# Patient Record
Sex: Male | Born: 1970 | Race: Black or African American | Hispanic: No | Marital: Single | Smoking: Never smoker
Health system: Southern US, Community
[De-identification: ages and names within clinical notes are randomized; demographics above are authoritative.]

## PROBLEM LIST (undated history)

## (undated) ENCOUNTER — Ambulatory Visit

## (undated) ENCOUNTER — Ambulatory Visit: Payer: PRIVATE HEALTH INSURANCE

## (undated) ENCOUNTER — Ambulatory Visit: Payer: PRIVATE HEALTH INSURANCE | Attending: Oncology | Primary: Oncology

## (undated) ENCOUNTER — Encounter

## (undated) ENCOUNTER — Telehealth
Attending: Student in an Organized Health Care Education/Training Program | Primary: Student in an Organized Health Care Education/Training Program

## (undated) ENCOUNTER — Ambulatory Visit: Payer: Medicaid (Managed Care)

## (undated) ENCOUNTER — Other Ambulatory Visit

## (undated) ENCOUNTER — Encounter
Attending: Student in an Organized Health Care Education/Training Program | Primary: Student in an Organized Health Care Education/Training Program

## (undated) ENCOUNTER — Telehealth: Attending: Family Medicine | Primary: Family Medicine

## (undated) ENCOUNTER — Telehealth

## (undated) ENCOUNTER — Ambulatory Visit
Payer: Medicaid (Managed Care) | Attending: Student in an Organized Health Care Education/Training Program | Primary: Student in an Organized Health Care Education/Training Program

## (undated) ENCOUNTER — Encounter: Attending: Oncology | Primary: Oncology

## (undated) ENCOUNTER — Ambulatory Visit: Payer: Medicaid (Managed Care) | Attending: Ambulatory Care | Primary: Ambulatory Care

## (undated) ENCOUNTER — Encounter: Attending: Ambulatory Care | Primary: Ambulatory Care

## (undated) ENCOUNTER — Telehealth: Attending: Oncology | Primary: Oncology

## (undated) ENCOUNTER — Ambulatory Visit
Payer: MEDICAID | Attending: Student in an Organized Health Care Education/Training Program | Primary: Student in an Organized Health Care Education/Training Program

## (undated) ENCOUNTER — Encounter: Attending: Family Medicine | Primary: Family Medicine

## (undated) ENCOUNTER — Ambulatory Visit: Payer: PRIVATE HEALTH INSURANCE | Attending: Speech-Language Pathologist | Primary: Speech-Language Pathologist

## (undated) ENCOUNTER — Encounter: Attending: "Endocrinology | Primary: "Endocrinology

## (undated) ENCOUNTER — Other Ambulatory Visit: Attending: Clinical | Primary: Clinical

## (undated) ENCOUNTER — Ambulatory Visit: Attending: Family Medicine | Primary: Family Medicine

## (undated) ENCOUNTER — Ambulatory Visit: Payer: PRIVATE HEALTH INSURANCE | Attending: Ambulatory Care | Primary: Ambulatory Care

## (undated) ENCOUNTER — Encounter: Attending: Internal Medicine | Primary: Internal Medicine

## (undated) ENCOUNTER — Telehealth: Attending: "Endocrinology | Primary: "Endocrinology

## (undated) ENCOUNTER — Encounter: Attending: Pulmonary Disease | Primary: Pulmonary Disease

## (undated) ENCOUNTER — Ambulatory Visit: Attending: Adult Health | Primary: Adult Health

## (undated) ENCOUNTER — Encounter: Attending: Radiation Oncology | Primary: Radiation Oncology

## (undated) ENCOUNTER — Ambulatory Visit
Payer: PRIVATE HEALTH INSURANCE | Attending: Student in an Organized Health Care Education/Training Program | Primary: Student in an Organized Health Care Education/Training Program

## (undated) ENCOUNTER — Encounter: Attending: Nuclear Radiology | Primary: Nuclear Radiology

## (undated) ENCOUNTER — Ambulatory Visit: Payer: PRIVATE HEALTH INSURANCE | Attending: Registered" | Primary: Registered"

## (undated) ENCOUNTER — Encounter: Attending: Dermatology | Primary: Dermatology

## (undated) ENCOUNTER — Ambulatory Visit: Payer: PRIVATE HEALTH INSURANCE | Attending: "Endocrinology | Primary: "Endocrinology

## (undated) ENCOUNTER — Telehealth: Attending: Pulmonary Disease | Primary: Pulmonary Disease

## (undated) ENCOUNTER — Ambulatory Visit: Payer: Medicaid (Managed Care) | Attending: Oncology | Primary: Oncology

## (undated) DIAGNOSIS — I10 Essential (primary) hypertension: Secondary | ICD-10-CM

## (undated) DIAGNOSIS — E119 Type 2 diabetes mellitus without complications: Secondary | ICD-10-CM

---

## 2000-10-17 ENCOUNTER — Encounter: Payer: Self-pay | Admitting: Emergency Medicine

## 2000-10-17 ENCOUNTER — Emergency Department (HOSPITAL_COMMUNITY): Admission: EM | Admit: 2000-10-17 | Discharge: 2000-10-17 | Payer: Self-pay | Admitting: Emergency Medicine

## 2003-10-19 ENCOUNTER — Emergency Department (HOSPITAL_COMMUNITY): Admission: EM | Admit: 2003-10-19 | Discharge: 2003-10-19 | Payer: Self-pay | Admitting: *Deleted

## 2004-01-07 ENCOUNTER — Emergency Department (HOSPITAL_COMMUNITY): Admission: EM | Admit: 2004-01-07 | Discharge: 2004-01-08 | Payer: Self-pay | Admitting: Emergency Medicine

## 2004-02-04 ENCOUNTER — Emergency Department (HOSPITAL_COMMUNITY): Admission: EM | Admit: 2004-02-04 | Discharge: 2004-02-04 | Payer: Self-pay | Admitting: Emergency Medicine

## 2004-02-15 ENCOUNTER — Emergency Department (HOSPITAL_COMMUNITY): Admission: EM | Admit: 2004-02-15 | Discharge: 2004-02-16 | Payer: Self-pay | Admitting: *Deleted

## 2015-10-16 ENCOUNTER — Encounter (HOSPITAL_COMMUNITY): Payer: Self-pay

## 2015-10-16 ENCOUNTER — Emergency Department (HOSPITAL_COMMUNITY)
Admission: EM | Admit: 2015-10-16 | Discharge: 2015-10-16 | Disposition: A | Discharge status: Home / Self Care | Payer: Self-pay | Attending: Emergency Medicine | Admitting: Emergency Medicine

## 2015-10-16 DIAGNOSIS — Y999 Unspecified external cause status: Secondary | ICD-10-CM | POA: Insufficient documentation

## 2015-10-16 DIAGNOSIS — S01511A Laceration without foreign body of lip, initial encounter: Secondary | ICD-10-CM | POA: Insufficient documentation

## 2015-10-16 DIAGNOSIS — Y939 Activity, unspecified: Secondary | ICD-10-CM | POA: Insufficient documentation

## 2015-10-16 DIAGNOSIS — W228XXA Striking against or struck by other objects, initial encounter: Secondary | ICD-10-CM | POA: Insufficient documentation

## 2015-10-16 DIAGNOSIS — Y929 Unspecified place or not applicable: Secondary | ICD-10-CM | POA: Insufficient documentation

## 2015-10-16 MED ORDER — OXYCODONE-ACETAMINOPHEN 5-325 MG PO TABS
1.0000 | ORAL_TABLET | Freq: Four times a day (QID) | ORAL | 0 refills | Status: AC | PRN
Start: 2015-10-16 — End: ?

## 2015-10-16 MED ORDER — CLINDAMYCIN HCL 150 MG PO CAPS: 300.0000 mg | ORAL_CAPSULE | Freq: Three times a day (TID) | ORAL | 0 refills | Status: AC

## 2015-10-16 MED ORDER — LIDOCAINE HCL (PF) 1 % IJ SOLN
5.0000 mL | Freq: Once | INTRAMUSCULAR | Status: AC
  Administered 2015-10-16: 5 mL
  Filled 2015-10-16: qty 5
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
## 2015-10-16 NOTE — Discharge Instructions (Signed)
You must return in 5 days to have your sutures removed.  Return sooner for fever, swelling, or discharge.  Take antibiotics as directed.

## 2016-02-02 DIAGNOSIS — R739 Hyperglycemia, unspecified: Secondary | ICD-10-CM

## 2016-02-17 LAB — GLUCOSE, POCT (MANUAL RESULT ENTRY): POC Glucose: 468 mg/dl — AB (ref 70–99)

## 2016-02-17 NOTE — Congregational Nurse Program (Signed)
Congregational Nurse Program Note  Date of Encounter: 02/02/2016  Past Medical History: No past medical history on file.  Encounter Details:     CNP Questionnaire - 02/02/16 1100      Patient Demographics   Is this a new or existing patient? New   Patient is considered a/an Not Applicable   Race African-American/Black     Patient Assistance   Location of Patient Assistance Not Applicable   Patient's financial/insurance status Low Income;Self-Pay (Uninsured)   Uninsured Patient ([employer], [employer]s) Yes   Interventions Not Applicable   Patient referred to apply for the following financial assistance Not Applicable   Food insecurities addressed Not Applicable   Transportation assistance No   Educational health offerings Other     Encounter Details   Primary purpose of visit Other   Was an Emergency Department visit averted? Not Applicable   Does patient have a medical provider? Yes   Patient referred to Not Applicable   Was a mental health screening completed? (GAINS tool) No   Does patient have dental issues? No   Does patient have vision issues? No   Does your patient have an abnormal blood pressure today? No   Since previous encounter, have you referred patient for abnormal blood pressure that resulted in a new diagnosis or medication change? No   Does your patient have an abnormal blood glucose today? No   Since previous encounter, have you referred patient for abnormal blood glucose that resulted in a new diagnosis or medication change? No   Was there a life-saving intervention made? No     Requested B/P check and blood glucose check.  B/P 160/100   CBG 468.  Assisted with referral to Chales AbrahamsMary Ann Placey to be seen the next day.  [facility]

## 2016-07-10 ENCOUNTER — Emergency Department (HOSPITAL_COMMUNITY)
Admission: EM | Admit: 2016-07-10 | Discharge: 2016-07-11 | Disposition: A | Discharge status: Home / Self Care | Payer: Self-pay | Attending: Emergency Medicine | Admitting: Emergency Medicine

## 2016-07-10 ENCOUNTER — Emergency Department (HOSPITAL_COMMUNITY): Payer: Self-pay

## 2016-07-10 ENCOUNTER — Encounter (HOSPITAL_COMMUNITY): Payer: Self-pay | Admitting: Emergency Medicine

## 2016-07-10 DIAGNOSIS — Y929 Unspecified place or not applicable: Secondary | ICD-10-CM | POA: Insufficient documentation

## 2016-07-10 DIAGNOSIS — S20412A Abrasion of left back wall of thorax, initial encounter: Secondary | ICD-10-CM | POA: Insufficient documentation

## 2016-07-10 DIAGNOSIS — S01511A Laceration without foreign body of lip, initial encounter: Secondary | ICD-10-CM | POA: Insufficient documentation

## 2016-07-10 DIAGNOSIS — E119 Type 2 diabetes mellitus without complications: Secondary | ICD-10-CM | POA: Insufficient documentation

## 2016-07-10 DIAGNOSIS — Y999 Unspecified external cause status: Secondary | ICD-10-CM | POA: Insufficient documentation

## 2016-07-10 DIAGNOSIS — S40212A Abrasion of left shoulder, initial encounter: Secondary | ICD-10-CM | POA: Insufficient documentation

## 2016-07-10 DIAGNOSIS — Y939 Activity, unspecified: Secondary | ICD-10-CM | POA: Insufficient documentation

## 2016-07-10 DIAGNOSIS — S0990XA Unspecified injury of head, initial encounter: Secondary | ICD-10-CM

## 2016-07-10 DIAGNOSIS — Z79899 Other long term (current) drug therapy: Secondary | ICD-10-CM | POA: Insufficient documentation

## 2016-07-10 DIAGNOSIS — S8002XA Contusion of left knee, initial encounter: Secondary | ICD-10-CM | POA: Insufficient documentation

## 2016-07-10 DIAGNOSIS — T07XXXA Unspecified multiple injuries, initial encounter: Secondary | ICD-10-CM

## 2016-07-10 DIAGNOSIS — S060X1A Concussion with loss of consciousness of 30 minutes or less, initial encounter: Secondary | ICD-10-CM | POA: Insufficient documentation

## 2016-07-10 DIAGNOSIS — I10 Essential (primary) hypertension: Secondary | ICD-10-CM | POA: Insufficient documentation

## 2016-07-10 DIAGNOSIS — M25562 Pain in left knee: Secondary | ICD-10-CM | POA: Insufficient documentation

## 2016-07-10 DIAGNOSIS — R4182 Altered mental status, unspecified: Secondary | ICD-10-CM | POA: Insufficient documentation

## 2016-07-10 DIAGNOSIS — R935 Abnormal findings on diagnostic imaging of other abdominal regions, including retroperitoneum: Secondary | ICD-10-CM | POA: Insufficient documentation

## 2016-07-10 DIAGNOSIS — R911 Solitary pulmonary nodule: Secondary | ICD-10-CM | POA: Insufficient documentation

## 2016-07-10 HISTORY — DX: Essential (primary) hypertension: I10

## 2016-07-10 HISTORY — DX: Type 2 diabetes mellitus without complications: E11.9

## 2016-07-10 LAB — URINALYSIS, ROUTINE W REFLEX MICROSCOPIC
Bacteria, UA: NONE SEEN
Bilirubin Urine: NEGATIVE
HGB URINE DIPSTICK: NEGATIVE
Ketones, ur: 5 mg/dL — AB
Leukocytes, UA: NEGATIVE
Nitrite: NEGATIVE
PH: 5 (ref 5.0–8.0)
Protein, ur: NEGATIVE mg/dL
RBC / HPF: NONE SEEN RBC/hpf (ref 0–5)
SPECIFIC GRAVITY, URINE: 1.007 (ref 1.005–1.030)
Squamous Epithelial / LPF: NONE SEEN

## 2016-07-10 LAB — CBC WITH DIFFERENTIAL/PLATELET
BASOS PCT: 0 %
Basophils Absolute: 0 10*3/uL (ref 0.0–0.1)
EOS ABS: 0.1 10*3/uL (ref 0.0–0.7)
EOS PCT: 2 %
HCT: 38.9 % — ABNORMAL LOW (ref 39.0–52.0)
HEMOGLOBIN: 12.9 g/dL — AB (ref 13.0–17.0)
Lymphocytes Relative: 26 %
Lymphs Abs: 1.5 10*3/uL (ref 0.7–4.0)
MCH: 24.9 pg — ABNORMAL LOW (ref 26.0–34.0)
MCHC: 33.2 g/dL (ref 30.0–36.0)
MCV: 75.1 fL — ABNORMAL LOW (ref 78.0–100.0)
MONO ABS: 0.3 10*3/uL (ref 0.1–1.0)
MONOS PCT: 6 %
NEUTROS PCT: 66 %
Neutro Abs: 3.9 10*3/uL (ref 1.7–7.7)
PLATELETS: 174 10*3/uL (ref 150–400)
RBC: 5.18 MIL/uL (ref 4.22–5.81)
RDW: 14.3 % (ref 11.5–15.5)
WBC: 5.9 10*3/uL (ref 4.0–10.5)

## 2016-07-10 LAB — COMPREHENSIVE METABOLIC PANEL
ALK PHOS: 80 U/L (ref 38–126)
ALT: 13 U/L — AB (ref 17–63)
AST: 25 U/L (ref 15–41)
Albumin: 3.7 g/dL (ref 3.5–5.0)
Anion gap: 19 — ABNORMAL HIGH (ref 5–15)
BUN: 10 mg/dL (ref 6–20)
CALCIUM: 8.7 mg/dL — AB (ref 8.9–10.3)
CO2: 18 mmol/L — AB (ref 22–32)
CREATININE: 0.99 mg/dL (ref 0.61–1.24)
Chloride: 97 mmol/L — ABNORMAL LOW (ref 101–111)
GFR calc non Af Amer: >60 mL/min (ref >60)
Glucose, Bld: 401 mg/dL — ABNORMAL HIGH (ref 65–99)
Potassium: 3.5 mmol/L (ref 3.5–5.1)
SODIUM: 134 mmol/L — AB (ref 135–145)
Total Bilirubin: 0.6 mg/dL (ref 0.3–1.2)
Total Protein: 6.5 g/dL (ref 6.5–8.1)

## 2016-07-10 LAB — I-STAT CHEM 8, ED
BUN: 10 mg/dL (ref 6–20)
CALCIUM ION: 1.13 mmol/L — AB (ref 1.15–1.40)
CHLORIDE: 97 mmol/L — AB (ref 101–111)
Creatinine, Ser: 1.1 mg/dL (ref 0.61–1.24)
Glucose, Bld: 406 mg/dL — ABNORMAL HIGH (ref 65–99)
HCT: 41 % (ref 39.0–52.0)
Hemoglobin: 13.9 g/dL (ref 13.0–17.0)
Potassium: 3.3 mmol/L — ABNORMAL LOW (ref 3.5–5.1)
SODIUM: 135 mmol/L (ref 135–145)
TCO2: 18 mmol/L (ref 0–100)

## 2016-07-10 LAB — RAPID URINE DRUG SCREEN, HOSP PERFORMED
AMPHETAMINES: NOT DETECTED
Barbiturates: NOT DETECTED
Benzodiazepines: NOT DETECTED
Cocaine: NOT DETECTED
Opiates: NOT DETECTED
TETRAHYDROCANNABINOL: NOT DETECTED

## 2016-07-10 LAB — CBG MONITORING, ED: Glucose-Capillary: 430 mg/dL — ABNORMAL HIGH (ref 65–99)

## 2016-07-10 LAB — ETHANOL: Alcohol, Ethyl (B): 203 mg/dL — ABNORMAL HIGH (ref <5)

## 2016-07-10 MED ORDER — SODIUM CHLORIDE 0.9 % IV BOLUS (SEPSIS)
500.0000 mL | Freq: Once | INTRAVENOUS | Status: AC
  Administered 2016-07-10: 500 mL via INTRAVENOUS
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
## 2016-07-10 NOTE — ED Notes (Signed)
ED Provider at bedside. 

## 2016-07-11 ENCOUNTER — Emergency Department (HOSPITAL_COMMUNITY): Payer: Self-pay

## 2016-07-11 ENCOUNTER — Encounter (HOSPITAL_COMMUNITY): Payer: Self-pay

## 2016-07-11 LAB — BASIC METABOLIC PANEL
Anion gap: 13 (ref 5–15)
BUN: 5 mg/dL — AB (ref 6–20)
CHLORIDE: 101 mmol/L (ref 101–111)
CO2: 23 mmol/L (ref 22–32)
CREATININE: 0.92 mg/dL (ref 0.61–1.24)
Calcium: 8.9 mg/dL (ref 8.9–10.3)
GFR calc Af Amer: >60 mL/min (ref >60)
GFR calc non Af Amer: >60 mL/min (ref >60)
Glucose, Bld: 348 mg/dL — ABNORMAL HIGH (ref 65–99)
Potassium: 3.9 mmol/L (ref 3.5–5.1)
Sodium: 137 mmol/L (ref 135–145)

## 2016-07-11 LAB — I-STAT VENOUS BLOOD GAS, ED
Bicarbonate: 24.7 mmol/L (ref 20.0–28.0)
O2 SAT: 83 %
PCO2 VEN: 39.9 mmHg — AB (ref 44.0–60.0)
PO2 VEN: 47 mmHg — AB (ref 32.0–45.0)
TCO2: 26 mmol/L (ref 0–100)
pH, Ven: 7.4 (ref 7.250–7.430)

## 2016-07-11 MED ORDER — IOPAMIDOL (ISOVUE-300) INJECTION 61%
INTRAVENOUS | Status: AC
  Administered 2016-07-11: 100 mL via INTRAVENOUS
  Filled 2016-07-11: qty 100

## 2016-07-11 MED ORDER — KETOROLAC TROMETHAMINE 30 MG/ML IJ SOLN
15.0000 mg | Freq: Once | INTRAMUSCULAR | Status: AC
  Administered 2016-07-11: 15 mg via INTRAVENOUS
  Filled 2016-07-11: qty 1

## 2016-07-11 NOTE — ED Notes (Signed)
ED Provider at bedside. 

## 2016-07-11 NOTE — ED Notes (Signed)
Pt departed in NAD. Unable to obtain pt signature for d/c because there were no electronic signature pads available.
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
## 2016-07-11 NOTE — ED Provider Notes (Signed)
Anion gap more likely from ETOH rather than DKA. Repeat BMP benign.  He continues to have left knee pain. X-rays unremarkable. He is able to bear weight but is limping. He states this is an acute on chronic issue. He was given crutches and will follow-up with PCP.   Pricilla LovelessGoldston, Gillian Meeuwsen, MD 07/11/16 [phone]

## 2016-07-11 NOTE — ED Notes (Signed)
Patient transported to X-ray 

## 2016-07-11 NOTE — ED Notes (Signed)
Attempted to ambulate pt, but pt only able to take a few steps while standing, seeming to be unable to bear much weight on L leg.

## 2016-07-28 ENCOUNTER — Emergency Department (HOSPITAL_COMMUNITY)
Admission: EM | Admit: 2016-07-28 | Discharge: 2016-07-29 | Disposition: A | Discharge status: Home / Self Care | Payer: Self-pay | Attending: Emergency Medicine | Admitting: Emergency Medicine

## 2016-07-28 ENCOUNTER — Encounter (HOSPITAL_COMMUNITY): Payer: Self-pay | Admitting: Emergency Medicine

## 2016-07-28 ENCOUNTER — Emergency Department (HOSPITAL_COMMUNITY): Payer: Self-pay

## 2016-07-28 DIAGNOSIS — F1010 Alcohol abuse, uncomplicated: Secondary | ICD-10-CM | POA: Insufficient documentation

## 2016-07-28 DIAGNOSIS — I10 Essential (primary) hypertension: Secondary | ICD-10-CM | POA: Insufficient documentation

## 2016-07-28 DIAGNOSIS — E1165 Type 2 diabetes mellitus with hyperglycemia: Secondary | ICD-10-CM | POA: Insufficient documentation

## 2016-07-28 DIAGNOSIS — R739 Hyperglycemia, unspecified: Secondary | ICD-10-CM

## 2016-07-28 LAB — CBG MONITORING, ED: Glucose-Capillary: 393 mg/dL — ABNORMAL HIGH (ref 65–99)

## 2016-07-28 LAB — I-STAT CHEM 8, ED
BUN: 7 mg/dL (ref 6–20)
CALCIUM ION: 1.21 mmol/L (ref 1.15–1.40)
CHLORIDE: 98 mmol/L — AB (ref 101–111)
CREATININE: 1.3 mg/dL — AB (ref 0.61–1.24)
Glucose, Bld: 449 mg/dL — ABNORMAL HIGH (ref 65–99)
HCT: 39 % (ref 39.0–52.0)
Hemoglobin: 13.3 g/dL (ref 13.0–17.0)
Potassium: 3.5 mmol/L (ref 3.5–5.1)
SODIUM: 138 mmol/L (ref 135–145)
TCO2: 28 mmol/L (ref 0–100)

## 2016-07-28 LAB — ETHANOL: Alcohol, Ethyl (B): 214 mg/dL — ABNORMAL HIGH (ref <5)

## 2016-07-28 LAB — I-STAT TROPONIN, ED: TROPONIN I, POC: 0.02 ng/mL (ref 0.00–0.08)
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
## 2016-07-28 NOTE — ED Notes (Signed)
Pt would not stop eating his [employer] to answer questions.

## 2016-07-28 NOTE — ED Notes (Signed)
Patient transported to CT 
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
## 2016-07-28 NOTE — ED Triage Notes (Signed)
Pt was at [employer] eating when he had a syncopal episode. Pt has ETOH on board. Pt was agitated at first but clamed down since. Recently incarcerated and has not been on any of his home medication for approx a month. EMS report glucose of 489 and had 500 cc NS infusing. Pt is asleep during triage

## 2016-07-29 LAB — CBG MONITORING, ED: Glucose-Capillary: 302 mg/dL — ABNORMAL HIGH (ref 65–99)

## 2016-07-29 NOTE — ED Notes (Signed)
Pt more alert at this time. Pt asking for water is able to get up out of bed.

## 2016-07-29 NOTE — ED Provider Notes (Signed)
Plan to monitor patient until sober then reassess He is currently resting comfortably, vitals appropriate BP (!) 134/93   Pulse 94   Temp 98.4 F (36.9 C) (Oral)   Resp (!) 26   SpO2 96%     Zadie RhineWickline, Jaleena Viviani, MD 07/29/16 0031

## 2016-07-29 NOTE — ED Notes (Signed)
EDP at bedside  
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
## 2016-07-29 NOTE — ED Notes (Addendum)
PT states understanding of care given, follow up care. PT ambulated from ED to car with a steady gait.  

## 2016-08-08 ENCOUNTER — Emergency Department (HOSPITAL_COMMUNITY)
Admission: EM | Admit: 2016-08-08 | Discharge: 2016-08-08 | Disposition: A | Discharge status: Home / Self Care | Payer: Self-pay | Attending: Emergency Medicine | Admitting: Emergency Medicine

## 2016-08-08 ENCOUNTER — Encounter (HOSPITAL_COMMUNITY): Payer: Self-pay | Admitting: Emergency Medicine

## 2016-08-08 DIAGNOSIS — Z0131 Encounter for examination of blood pressure with abnormal findings: Secondary | ICD-10-CM | POA: Insufficient documentation

## 2016-08-08 DIAGNOSIS — I10 Essential (primary) hypertension: Secondary | ICD-10-CM

## 2016-08-08 LAB — CBC
HEMATOCRIT: 38.2 % — AB (ref 39.0–52.0)
HEMOGLOBIN: 12 g/dL — AB (ref 13.0–17.0)
MCH: 24 pg — AB (ref 26.0–34.0)
MCHC: 31.4 g/dL (ref 30.0–36.0)
MCV: 76.2 fL — AB (ref 78.0–100.0)
Platelets: 236 10*3/uL (ref 150–400)
RBC: 5.01 MIL/uL (ref 4.22–5.81)
RDW: 15 % (ref 11.5–15.5)
WBC: 7.7 10*3/uL (ref 4.0–10.5)

## 2016-08-08 LAB — BASIC METABOLIC PANEL
Anion gap: 8 (ref 5–15)
BUN: 7 mg/dL (ref 6–20)
CALCIUM: 9.1 mg/dL (ref 8.9–10.3)
CHLORIDE: 100 mmol/L — AB (ref 101–111)
CO2: 26 mmol/L (ref 22–32)
CREATININE: 0.82 mg/dL (ref 0.61–1.24)
GFR calc non Af Amer: >60 mL/min (ref >60)
GLUCOSE: 209 mg/dL — AB (ref 65–99)
Potassium: 3.6 mmol/L (ref 3.5–5.1)
Sodium: 134 mmol/L — ABNORMAL LOW (ref 135–145)

## 2016-08-08 MED ORDER — CLONIDINE HCL 0.1 MG PO TABS
0.1000 mg | ORAL_TABLET | Freq: Once | ORAL | Status: AC
  Administered 2016-08-08: 0.1 mg via ORAL
  Filled 2016-08-08: qty 1

## 2016-08-08 MED ORDER — AMLODIPINE BESYLATE 5 MG PO TABS: 5.0000 mg | ORAL_TABLET | Freq: Every day | ORAL | 1 refills | Status: AC

## 2016-08-08 MED ORDER — AMLODIPINE BESYLATE 5 MG PO TABS
5.0000 mg | ORAL_TABLET | Freq: Once | ORAL | Status: AC
  Administered 2016-08-08: 5 mg via ORAL
  Filled 2016-08-08: qty 1

## 2016-08-08 MED ORDER — ACETAMINOPHEN 500 MG PO TABS
1000.0000 mg | ORAL_TABLET | Freq: Once | ORAL | Status: AC
  Administered 2016-08-08: 1000 mg via ORAL
  Filled 2016-08-08: qty 2

## 2016-08-08 NOTE — Discharge Instructions (Signed)
You have been prescribed Norvasc for your blood pressure. This medication is $4 at the [facility]. Follow up with your primary care doctor for a recheck of your blood pressure within 1 week.

## 2016-08-08 NOTE — ED Triage Notes (Signed)
Pt. Stated, Vincent Atlasve been in jail for the last few days and my BP is too high per the nurse at jail.
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
## 2016-10-27 ENCOUNTER — Encounter (HOSPITAL_COMMUNITY): Payer: Self-pay | Admitting: Emergency Medicine

## 2016-10-27 ENCOUNTER — Emergency Department (HOSPITAL_COMMUNITY)
Admission: EM | Admit: 2016-10-27 | Discharge: 2016-10-28 | Disposition: A | Discharge status: Home / Self Care | Payer: Self-pay | Attending: Emergency Medicine | Admitting: Emergency Medicine

## 2016-10-27 DIAGNOSIS — K4090 Unilateral inguinal hernia, without obstruction or gangrene, not specified as recurrent: Secondary | ICD-10-CM | POA: Insufficient documentation

## 2016-10-27 DIAGNOSIS — E119 Type 2 diabetes mellitus without complications: Secondary | ICD-10-CM | POA: Insufficient documentation

## 2016-10-27 DIAGNOSIS — Z79899 Other long term (current) drug therapy: Secondary | ICD-10-CM | POA: Insufficient documentation

## 2016-10-27 DIAGNOSIS — K625 Hemorrhage of anus and rectum: Secondary | ICD-10-CM | POA: Insufficient documentation

## 2016-10-27 DIAGNOSIS — I10 Essential (primary) hypertension: Secondary | ICD-10-CM | POA: Insufficient documentation

## 2016-10-27 LAB — CBC WITH DIFFERENTIAL/PLATELET
Basophils Absolute: 0 10*3/uL (ref 0.0–0.1)
Basophils Relative: 0 %
EOS ABS: 0.1 10*3/uL (ref 0.0–0.7)
EOS PCT: 3 %
HCT: 37.2 % — ABNORMAL LOW (ref 39.0–52.0)
Hemoglobin: 12.1 g/dL — ABNORMAL LOW (ref 13.0–17.0)
LYMPHS ABS: 1.6 10*3/uL (ref 0.7–4.0)
LYMPHS PCT: 41 %
MCH: 24.2 pg — AB (ref 26.0–34.0)
MCHC: 32.5 g/dL (ref 30.0–36.0)
MCV: 74.5 fL — AB (ref 78.0–100.0)
MONO ABS: 0.3 10*3/uL (ref 0.1–1.0)
MONOS PCT: 7 %
Neutro Abs: 1.9 10*3/uL (ref 1.7–7.7)
Neutrophils Relative %: 49 %
PLATELETS: 235 10*3/uL (ref 150–400)
RBC: 4.99 MIL/uL (ref 4.22–5.81)
RDW: 14.3 % (ref 11.5–15.5)
WBC: 3.9 10*3/uL — ABNORMAL LOW (ref 4.0–10.5)

## 2016-10-27 LAB — COMPREHENSIVE METABOLIC PANEL
ALK PHOS: 83 U/L (ref 38–126)
ALT: 19 U/L (ref 17–63)
ANION GAP: 13 (ref 5–15)
AST: 34 U/L (ref 15–41)
Albumin: 3.4 g/dL — ABNORMAL LOW (ref 3.5–5.0)
BUN: 9 mg/dL (ref 6–20)
CO2: 24 mmol/L (ref 22–32)
Calcium: 9 mg/dL (ref 8.9–10.3)
Chloride: 104 mmol/L (ref 101–111)
Creatinine, Ser: 1.24 mg/dL (ref 0.61–1.24)
GFR calc Af Amer: >60 mL/min (ref >60)
GFR calc non Af Amer: >60 mL/min (ref >60)
GLUCOSE: 106 mg/dL — AB (ref 65–99)
POTASSIUM: 3.6 mmol/L (ref 3.5–5.1)
SODIUM: 141 mmol/L (ref 135–145)
Total Bilirubin: 0.4 mg/dL (ref 0.3–1.2)
Total Protein: 6.5 g/dL (ref 6.5–8.1)

## 2016-10-27 NOTE — ED Notes (Signed)
Unable to locate x 1  

## 2016-10-27 NOTE — ED Triage Notes (Signed)
Patient reports rectal bleeding and blood in stool onset today , denies emesis or fever , no diarrhea or abdominal pain .

## 2016-10-28 LAB — POC OCCULT BLOOD, ED: Fecal Occult Bld: NEGATIVE

## 2016-10-28 MED ORDER — HYDROCORTISONE ACETATE 25 MG RE SUPP: 25.0000 mg | Freq: Two times a day (BID) | RECTAL | 0 refills | Status: AC

## 2016-10-28 NOTE — ED Provider Notes (Signed)
[facility] Provider Note   CSN: [id number] Arrival date & time: 10/27/16  1948     History   Chief Complaint Chief Complaint  Patient presents with  . Rectal Bleeding    HPI Vincent Stanton is a 46 y.o. male.  Patient presents with complaints of rectal bleeding. Patient reports that he has had 2 episodes tonight, has never had similar bleeding in the past. He had an episode of bright red blood passage without stool earlier today. He then had a second bowel movement that was blood mixed with stool. He has not had any rectal pain or abdominal pain. He has no fever. There is no chest pain, shortness of breath, palpitations, weakness or dizziness.  Patient also complains of a nonpainful lump in his right groin. This has been there for some time. The lump "comes and goes".      Past Medical History:  Diagnosis Date  . Diabetes mellitus without complication (HCC)   . Hypertension     There are no active problems to display for this patient.   History reviewed. No pertinent surgical history.     Home Medications    Prior to Admission medications   Medication Sig Start Date End Date Taking? Authorizing Provider  amLODipine (NORVASC) 5 MG tablet Take 1 tablet (5 mg total) by mouth daily. 08/08/16   Antony Madura, PA-C  clindamycin (CLEOCIN) 150 MG capsule Take 2 capsules (300 mg total) by mouth 3 (three) times daily. May dispense as  capsules 10/16/15   Roxy Horseman, PA-C  oxyCODONE-acetaminophen (PERCOCET/ROXICET) 5-325 MG tablet Take 1 tablet by mouth every 6 (six) hours as needed for severe pain. 10/16/15   Roxy Horseman, PA-C    Family History No family history on file.  Social History Social History  Substance Use Topics  . Smoking status: Never Smoker  . Smokeless tobacco: Never Used  . Alcohol use Yes     Allergies   Penicillins   Review of Systems Review of Systems  Gastrointestinal: Positive for anal bleeding.  All other systems  reviewed and are negative.    Physical Exam Updated Vital Signs BP (!) 180/103 (BP Location: Right Arm)   Pulse 89   Temp 99 F (37.2 C) (Oral)   Resp 18   SpO2 97%   Physical Exam  Constitutional: He is oriented to person, place, and time. He appears well-developed and well-nourished. No distress.  HENT:  Head: Normocephalic and atraumatic.  Right Ear: Hearing normal.  Left Ear: Hearing normal.  Nose: Nose normal.  Mouth/Throat: Oropharynx is clear and moist and mucous membranes are normal.  Eyes: Pupils are equal, round, and reactive to light. Conjunctivae and EOM are normal.  Neck: Normal range of motion. Neck supple.  Cardiovascular: Regular rhythm, S1 normal and S2 normal.  Exam reveals no gallop and no friction rub.   No murmur heard. Pulmonary/Chest: Effort normal and breath sounds normal. No respiratory distress. He exhibits no tenderness.  Abdominal: Soft. Normal appearance and bowel sounds are normal. There is no hepatosplenomegaly. There is no tenderness. There is no rebound, no guarding, no tenderness at McBurney's point and negative Murphy's sign. A hernia is present. Hernia confirmed positive in the right inguinal area (Easily reducible, nontender).  Genitourinary: Rectal exam shows external hemorrhoid. Rectal exam shows no mass and no tenderness.  Genitourinary Comments: Patient did not tolerate rectal exam, I could not reach his prostate or obtain an adequate sample  Musculoskeletal: Normal range of motion.  Neurological: He is  alert and oriented to person, place, and time. He has normal strength. No cranial nerve deficit or sensory deficit. Coordination normal. GCS eye subscore is 4. GCS verbal subscore is 5. GCS motor subscore is 6.  Skin: Skin is warm, dry and intact. No rash noted. No cyanosis.  Psychiatric: He has a normal mood and affect. His speech is normal and behavior is normal. Thought content normal.  Nursing note and vitals reviewed.    ED Treatments  / Results  Labs (all labs ordered are listed, but only abnormal results are displayed) Labs Reviewed  CBC WITH DIFFERENTIAL/PLATELET - Abnormal; Notable for the following:       Result Value   WBC 3.9 (*)    Hemoglobin 12.1 (*)    HCT 37.2 (*)    MCV 74.5 (*)    MCH 24.2 (*)    All other components within normal limits  COMPREHENSIVE METABOLIC PANEL - Abnormal; Notable for the following:    Glucose, Bld 106 (*)    Albumin 3.4 (*)    All other components within normal limits  POC OCCULT BLOOD, ED    EKG  EKG Interpretation None       Radiology No results found.  Procedures Procedures (including critical care time)  Medications Ordered in ED Medications - No data to display   Initial Impression / Assessment and Plan / ED Course  I have reviewed the triage vital signs and the nursing notes.  Pertinent labs & imaging results that were available during my care of the patient were reviewed by me and considered in my medical decision making (see chart for details).     Patient presents with 2 episodes of rectal bleeding today. He has never had similar symptoms. Vital signs are stable, other than hypertension which is chronic for him. No tachycardia, hypotension, dizziness or symptoms of anemia. Hemoglobin is actually normal. No further bleeding here in the ER. Patient will require follow-up with gastroenterology. I did discuss with him the need for colonoscopy to rule out colon cancer and other etiology as a cause of his bleeding. He is to return for worsening bleeding.  Reducible right inguinal hernia. Will refer to general surgery.  Final Clinical Impressions(s) / ED Diagnoses   Final diagnoses:  Rectal bleeding  Non-recurrent unilateral inguinal hernia without obstruction or gangrene    New Prescriptions New Prescriptions   No medications on file     Gilda Crease, MD 10/28/16 [phone]

## 2016-11-16 ENCOUNTER — Ambulatory Visit (HOSPITAL_COMMUNITY): Admission: EM | Admit: 2016-11-16 | Discharge: 2016-11-16 | Discharge status: Against Medical Advice | Payer: Self-pay

## 2016-11-21 ENCOUNTER — Encounter (HOSPITAL_COMMUNITY): Payer: Self-pay | Admitting: Emergency Medicine

## 2016-11-21 ENCOUNTER — Emergency Department (HOSPITAL_COMMUNITY)
Admission: EM | Admit: 2016-11-21 | Discharge: 2016-11-22 | Disposition: A | Discharge status: Home / Self Care | Payer: Self-pay | Attending: Emergency Medicine | Admitting: Emergency Medicine

## 2016-11-21 DIAGNOSIS — E119 Type 2 diabetes mellitus without complications: Secondary | ICD-10-CM | POA: Insufficient documentation

## 2016-11-21 DIAGNOSIS — Y929 Unspecified place or not applicable: Secondary | ICD-10-CM | POA: Insufficient documentation

## 2016-11-21 DIAGNOSIS — Z79899 Other long term (current) drug therapy: Secondary | ICD-10-CM | POA: Insufficient documentation

## 2016-11-21 DIAGNOSIS — S0181XA Laceration without foreign body of other part of head, initial encounter: Secondary | ICD-10-CM | POA: Insufficient documentation

## 2016-11-21 DIAGNOSIS — X58XXXA Exposure to other specified factors, initial encounter: Secondary | ICD-10-CM | POA: Insufficient documentation

## 2016-11-21 DIAGNOSIS — Z23 Encounter for immunization: Secondary | ICD-10-CM | POA: Insufficient documentation

## 2016-11-21 DIAGNOSIS — Y999 Unspecified external cause status: Secondary | ICD-10-CM | POA: Insufficient documentation

## 2016-11-21 DIAGNOSIS — Y939 Activity, unspecified: Secondary | ICD-10-CM | POA: Insufficient documentation

## 2016-11-21 DIAGNOSIS — I10 Essential (primary) hypertension: Secondary | ICD-10-CM | POA: Insufficient documentation
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
## 2016-11-22 ENCOUNTER — Emergency Department (HOSPITAL_COMMUNITY): Payer: Self-pay

## 2016-11-22 MED ORDER — TETANUS-DIPHTH-ACELL PERTUSSIS 5-2.5-18.5 LF-MCG/0.5 IM SUSP
0.5000 mL | Freq: Once | INTRAMUSCULAR | Status: AC
  Administered 2016-11-22: 0.5 mL via INTRAMUSCULAR
  Filled 2016-11-22: qty 0.5

## 2016-11-22 MED ORDER — LIDOCAINE HCL 2 % IJ SOLN
20.0000 mL | Freq: Once | INTRAMUSCULAR | Status: AC
  Administered 2016-11-22: 400 mg
  Filled 2016-11-22: qty 20

## 2016-11-22 NOTE — Discharge Instructions (Signed)
You will need your outside stitches removed in approximately one week. Soft diet and ice as needed to injury.

## 2016-11-22 NOTE — ED Provider Notes (Signed)
LACERATION REPAIR Performed by: Garlon HatchetSANDERS, Ivori Storr M Authorized by: Garlon HatchetSANDERS, Lilliane Sposito M Consent: Verbal consent obtained. Risks and benefits: risks, benefits and alternatives were discussed Consent given by: patient Patient identity confirmed: provided demographic data Prepped and Draped in normal sterile fashion Wound explored  Laceration Location: right cheek  Laceration Length: 2 cm  No Foreign Bodies seen or palpated  Anesthesia: local infiltration  Local anesthetic: lidocaine 2% without epinephrine  Anesthetic total: 3 ml  Irrigation method: syringe Amount of cleaning: standard  Skin closure: 4-0 vicryl (internal mucousa), and 4-0 prolene (skin)  Number of sutures: 4 total (2 internal, 2 external)  Technique: simple interrupted  Patient tolerance: Patient tolerated the procedure well with no immediate complications.    Garlon HatchetSanders, Dellamae Rosamilia M, PA-C 11/22/16 [phone]    Shon BatonHorton, Courtney F, MD 11/22/16 [phone]

## 2016-11-22 NOTE — ED Notes (Signed)
Patient transported to CT scan . 

## 2016-11-22 NOTE — ED Notes (Signed)
Patient given H2O and [place] sandwich/apple sauce.

## 2016-11-22 NOTE — ED Notes (Signed)
PA at bedside suturing pts laceration
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
## 2019-05-30 DIAGNOSIS — I422 Other hypertrophic cardiomyopathy: Principal | ICD-10-CM

## 2019-06-09 DIAGNOSIS — G6289 Other specified polyneuropathies: Principal | ICD-10-CM

## 2019-06-10 ENCOUNTER — Ambulatory Visit: Admit: 2019-06-10 | Discharge: 2019-06-11 | Payer: PRIVATE HEALTH INSURANCE

## 2019-07-08 ENCOUNTER — Ambulatory Visit: Admit: 2019-07-08 | Discharge: 2019-07-09 | Payer: PRIVATE HEALTH INSURANCE

## 2019-07-11 DIAGNOSIS — R079 Chest pain, unspecified: Principal | ICD-10-CM

## 2019-07-15 ENCOUNTER — Ambulatory Visit: Admit: 2019-07-15 | Discharge: 2019-07-16 | Payer: PRIVATE HEALTH INSURANCE

## 2019-08-11 DIAGNOSIS — R9389 Abnormal findings on diagnostic imaging of other specified body structures: Principal | ICD-10-CM

## 2019-08-26 ENCOUNTER — Ambulatory Visit: Admit: 2019-08-26 | Discharge: 2019-08-26 | Payer: PRIVATE HEALTH INSURANCE

## 2019-11-07 DIAGNOSIS — R590 Localized enlarged lymph nodes: Principal | ICD-10-CM

## 2019-11-20 ENCOUNTER — Ambulatory Visit: Admit: 2019-11-20 | Discharge: 2019-11-21 | Payer: PRIVATE HEALTH INSURANCE

## 2019-12-09 ENCOUNTER — Ambulatory Visit
Admit: 2019-12-09 | Discharge: 2019-12-10 | Payer: PRIVATE HEALTH INSURANCE | Attending: Pulmonary Disease | Primary: Pulmonary Disease

## 2019-12-09 DIAGNOSIS — Z87891 Personal history of nicotine dependence: Principal | ICD-10-CM

## 2019-12-09 DIAGNOSIS — R918 Other nonspecific abnormal finding of lung field: Principal | ICD-10-CM

## 2019-12-12 DIAGNOSIS — R911 Solitary pulmonary nodule: Principal | ICD-10-CM

## 2019-12-23 ENCOUNTER — Ambulatory Visit: Admit: 2019-12-23 | Discharge: 2019-12-24 | Payer: PRIVATE HEALTH INSURANCE

## 2019-12-23 DIAGNOSIS — R911 Solitary pulmonary nodule: Principal | ICD-10-CM

## 2019-12-23 DIAGNOSIS — Z7984 Long term (current) use of oral hypoglycemic drugs: Principal | ICD-10-CM

## 2019-12-23 DIAGNOSIS — Z7982 Long term (current) use of aspirin: Principal | ICD-10-CM

## 2019-12-23 DIAGNOSIS — E119 Type 2 diabetes mellitus without complications: Principal | ICD-10-CM

## 2019-12-23 DIAGNOSIS — R918 Other nonspecific abnormal finding of lung field: Principal | ICD-10-CM

## 2019-12-23 DIAGNOSIS — Z794 Long term (current) use of insulin: Principal | ICD-10-CM

## 2019-12-23 DIAGNOSIS — E785 Hyperlipidemia, unspecified: Principal | ICD-10-CM

## 2019-12-23 DIAGNOSIS — Z79899 Other long term (current) drug therapy: Principal | ICD-10-CM

## 2019-12-29 ENCOUNTER — Ambulatory Visit: Admit: 2019-12-29 | Discharge: 2019-12-30 | Payer: PRIVATE HEALTH INSURANCE

## 2020-01-08 DIAGNOSIS — Z79899 Other long term (current) drug therapy: Principal | ICD-10-CM

## 2020-01-08 DIAGNOSIS — E119 Type 2 diabetes mellitus without complications: Principal | ICD-10-CM

## 2020-01-08 DIAGNOSIS — Z7982 Long term (current) use of aspirin: Principal | ICD-10-CM

## 2020-01-08 DIAGNOSIS — Z7984 Long term (current) use of oral hypoglycemic drugs: Principal | ICD-10-CM

## 2020-01-08 DIAGNOSIS — E785 Hyperlipidemia, unspecified: Principal | ICD-10-CM

## 2020-01-08 DIAGNOSIS — Z794 Long term (current) use of insulin: Principal | ICD-10-CM

## 2020-01-08 DIAGNOSIS — R918 Other nonspecific abnormal finding of lung field: Principal | ICD-10-CM

## 2020-01-08 DIAGNOSIS — R911 Solitary pulmonary nodule: Principal | ICD-10-CM

## 2020-01-09 ENCOUNTER — Encounter: Admit: 2020-01-09 | Discharge: 2020-01-10 | Payer: PRIVATE HEALTH INSURANCE

## 2020-01-09 ENCOUNTER — Ambulatory Visit: Admit: 2020-01-09 | Discharge: 2020-01-10 | Payer: PRIVATE HEALTH INSURANCE

## 2020-01-09 DIAGNOSIS — Z79899 Other long term (current) drug therapy: Principal | ICD-10-CM

## 2020-01-09 DIAGNOSIS — Z794 Long term (current) use of insulin: Principal | ICD-10-CM

## 2020-01-09 DIAGNOSIS — Z7984 Long term (current) use of oral hypoglycemic drugs: Principal | ICD-10-CM

## 2020-01-09 DIAGNOSIS — E785 Hyperlipidemia, unspecified: Principal | ICD-10-CM

## 2020-01-09 DIAGNOSIS — R918 Other nonspecific abnormal finding of lung field: Principal | ICD-10-CM

## 2020-01-09 DIAGNOSIS — Z7982 Long term (current) use of aspirin: Principal | ICD-10-CM

## 2020-01-09 DIAGNOSIS — E119 Type 2 diabetes mellitus without complications: Principal | ICD-10-CM

## 2020-01-09 DIAGNOSIS — R911 Solitary pulmonary nodule: Principal | ICD-10-CM

## 2020-01-16 DIAGNOSIS — C73 Malignant neoplasm of thyroid gland: Principal | ICD-10-CM

## 2020-02-17 ENCOUNTER — Ambulatory Visit: Admit: 2020-02-17 | Discharge: 2020-02-18 | Payer: PRIVATE HEALTH INSURANCE

## 2020-02-17 DIAGNOSIS — C73 Malignant neoplasm of thyroid gland: Principal | ICD-10-CM

## 2020-02-19 DIAGNOSIS — C73 Malignant neoplasm of thyroid gland: Principal | ICD-10-CM

## 2020-02-24 ENCOUNTER — Ambulatory Visit
Admit: 2020-02-24 | Discharge: 2020-02-25 | Payer: PRIVATE HEALTH INSURANCE | Attending: Hematology & Oncology | Primary: Hematology & Oncology

## 2020-02-24 ENCOUNTER — Ambulatory Visit: Admit: 2020-02-24 | Discharge: 2020-02-25 | Payer: PRIVATE HEALTH INSURANCE

## 2020-02-24 DIAGNOSIS — C73 Malignant neoplasm of thyroid gland: Principal | ICD-10-CM

## 2020-03-09 ENCOUNTER — Ambulatory Visit: Admit: 2020-03-09 | Discharge: 2020-03-10 | Payer: PRIVATE HEALTH INSURANCE

## 2020-03-09 DIAGNOSIS — C73 Malignant neoplasm of thyroid gland: Principal | ICD-10-CM

## 2020-03-11 DIAGNOSIS — C73 Malignant neoplasm of thyroid gland: Principal | ICD-10-CM

## 2020-03-17 DIAGNOSIS — I10 Essential (primary) hypertension: Principal | ICD-10-CM

## 2020-03-17 DIAGNOSIS — I2510 Atherosclerotic heart disease of native coronary artery without angina pectoris: Principal | ICD-10-CM

## 2020-03-17 DIAGNOSIS — I259 Chronic ischemic heart disease, unspecified: Principal | ICD-10-CM

## 2020-03-17 DIAGNOSIS — R931 Abnormal findings on diagnostic imaging of heart and coronary circulation: Principal | ICD-10-CM

## 2020-03-22 ENCOUNTER — Ambulatory Visit: Admit: 2020-03-22 | Discharge: 2020-03-23 | Payer: PRIVATE HEALTH INSURANCE

## 2020-03-22 DIAGNOSIS — Z01818 Encounter for other preprocedural examination: Principal | ICD-10-CM

## 2020-03-22 DIAGNOSIS — I10 Essential (primary) hypertension: Principal | ICD-10-CM

## 2020-03-22 DIAGNOSIS — C73 Malignant neoplasm of thyroid gland: Principal | ICD-10-CM

## 2020-03-22 DIAGNOSIS — I259 Chronic ischemic heart disease, unspecified: Principal | ICD-10-CM

## 2020-04-12 ENCOUNTER — Ambulatory Visit: Admit: 2020-04-12 | Discharge: 2020-04-18 | Payer: PRIVATE HEALTH INSURANCE

## 2020-04-12 ENCOUNTER — Encounter: Admit: 2020-04-12 | Discharge: 2020-04-18 | Payer: PRIVATE HEALTH INSURANCE

## 2020-04-20 ENCOUNTER — Ambulatory Visit: Admit: 2020-04-20 | Discharge: 2020-04-21 | Payer: PRIVATE HEALTH INSURANCE

## 2020-04-20 DIAGNOSIS — C73 Malignant neoplasm of thyroid gland: Principal | ICD-10-CM

## 2020-05-18 ENCOUNTER — Ambulatory Visit: Admit: 2020-05-18 | Discharge: 2020-05-31 | Payer: PRIVATE HEALTH INSURANCE

## 2020-05-18 ENCOUNTER — Encounter
Admit: 2020-05-18 | Discharge: 2020-05-31 | Payer: PRIVATE HEALTH INSURANCE | Attending: Nuclear Radiology | Primary: Nuclear Radiology

## 2020-05-18 DIAGNOSIS — C73 Malignant neoplasm of thyroid gland: Principal | ICD-10-CM

## 2020-05-19 DIAGNOSIS — C73 Malignant neoplasm of thyroid gland: Principal | ICD-10-CM

## 2020-05-25 DIAGNOSIS — C73 Malignant neoplasm of thyroid gland: Principal | ICD-10-CM

## 2020-05-31 DIAGNOSIS — C73 Malignant neoplasm of thyroid gland: Principal | ICD-10-CM

## 2020-06-24 ENCOUNTER — Ambulatory Visit: Admit: 2020-06-24 | Discharge: 2020-06-24 | Payer: PRIVATE HEALTH INSURANCE

## 2020-06-24 ENCOUNTER — Other Ambulatory Visit: Admit: 2020-06-24 | Discharge: 2020-06-24 | Payer: PRIVATE HEALTH INSURANCE

## 2020-06-24 DIAGNOSIS — C73 Malignant neoplasm of thyroid gland: Principal | ICD-10-CM

## 2020-07-22 DIAGNOSIS — C73 Malignant neoplasm of thyroid gland: Principal | ICD-10-CM

## 2020-07-28 ENCOUNTER — Encounter
Admit: 2020-07-28 | Discharge: 2020-07-31 | Disposition: A | Discharge status: Home / Self Care | Payer: PRIVATE HEALTH INSURANCE | Attending: Anesthesiology

## 2020-07-28 ENCOUNTER — Ambulatory Visit
Admit: 2020-07-28 | Discharge: 2020-07-31 | Disposition: A | Discharge status: Home / Self Care | Payer: PRIVATE HEALTH INSURANCE

## 2020-07-30 MED ORDER — METOPROLOL TARTRATE 100 MG TABLET
ORAL_TABLET | Freq: Two times a day (BID) | ORAL | 0 refills | 30.00000 days | Status: CP
Start: 2020-07-30 — End: 2020-08-29

## 2020-07-30 MED ORDER — OXYCODONE 5 MG TABLET
ORAL_TABLET | ORAL | 0 refills | 4.00 days | Status: CP | PRN
Start: 2020-07-30 — End: 2020-08-04

## 2020-08-03 ENCOUNTER — Ambulatory Visit: Admit: 2020-08-03 | Payer: PRIVATE HEALTH INSURANCE | Attending: "Endocrinology | Primary: "Endocrinology

## 2020-08-05 ENCOUNTER — Ambulatory Visit: Admit: 2020-08-05 | Discharge: 2020-08-06 | Payer: PRIVATE HEALTH INSURANCE

## 2020-10-05 ENCOUNTER — Ambulatory Visit
Admit: 2020-10-05 | Discharge: 2020-10-06 | Payer: PRIVATE HEALTH INSURANCE | Attending: Student in an Organized Health Care Education/Training Program | Primary: Student in an Organized Health Care Education/Training Program

## 2020-10-05 DIAGNOSIS — C73 Malignant neoplasm of thyroid gland: Principal | ICD-10-CM

## 2020-11-09 ENCOUNTER — Ambulatory Visit
Admit: 2020-11-09 | Payer: PRIVATE HEALTH INSURANCE | Attending: Student in an Organized Health Care Education/Training Program | Primary: Student in an Organized Health Care Education/Training Program

## 2021-01-04 ENCOUNTER — Encounter
Admit: 2021-01-04 | Discharge: 2021-01-05 | Attending: Student in an Organized Health Care Education/Training Program | Primary: Student in an Organized Health Care Education/Training Program

## 2021-01-04 ENCOUNTER — Ambulatory Visit: Admit: 2021-01-04 | Discharge: 2021-01-05

## 2021-01-04 DIAGNOSIS — C799 Secondary malignant neoplasm of unspecified site: Principal | ICD-10-CM

## 2021-01-04 DIAGNOSIS — C73 Malignant neoplasm of thyroid gland: Principal | ICD-10-CM

## 2021-01-14 ENCOUNTER — Ambulatory Visit: Admit: 2021-01-14 | Discharge: 2021-01-15

## 2021-02-01 ENCOUNTER — Ambulatory Visit
Admit: 2021-02-01 | Discharge: 2021-02-02 | Attending: Student in an Organized Health Care Education/Training Program | Primary: Student in an Organized Health Care Education/Training Program

## 2021-02-01 DIAGNOSIS — C799 Secondary malignant neoplasm of unspecified site: Principal | ICD-10-CM

## 2021-02-01 DIAGNOSIS — C73 Malignant neoplasm of thyroid gland: Principal | ICD-10-CM

## 2021-02-01 MED ORDER — LENVATINIB 18 MG/DAY (10 MG X 1 AND 4 MG X 2) CAPSULE
ORAL_CAPSULE | Freq: Every day | ORAL | 2 refills | 0 days | Status: CP
Start: 2021-02-01 — End: ?

## 2021-02-03 DIAGNOSIS — C73 Malignant neoplasm of thyroid gland: Principal | ICD-10-CM

## 2021-02-03 DIAGNOSIS — C799 Secondary malignant neoplasm of unspecified site: Principal | ICD-10-CM

## 2021-02-10 ENCOUNTER — Ambulatory Visit: Admit: 2021-02-10 | Discharge: 2021-02-10 | Attending: Registered" | Primary: Registered"

## 2021-02-10 ENCOUNTER — Ambulatory Visit: Admit: 2021-02-10 | Discharge: 2021-02-10

## 2021-02-17 DIAGNOSIS — C73 Malignant neoplasm of thyroid gland: Principal | ICD-10-CM

## 2021-03-09 ENCOUNTER — Ambulatory Visit: Admit: 2021-03-09 | Payer: PRIVATE HEALTH INSURANCE | Attending: Oncology | Primary: Oncology

## 2021-03-09 ENCOUNTER — Ambulatory Visit: Admit: 2021-03-09 | Payer: PRIVATE HEALTH INSURANCE

## 2021-03-09 DIAGNOSIS — C73 Malignant neoplasm of thyroid gland: Principal | ICD-10-CM

## 2021-03-22 ENCOUNTER — Other Ambulatory Visit: Admit: 2021-03-22 | Discharge: 2021-03-22 | Payer: PRIVATE HEALTH INSURANCE

## 2021-03-22 ENCOUNTER — Ambulatory Visit: Admit: 2021-03-22 | Discharge: 2021-03-22 | Payer: PRIVATE HEALTH INSURANCE | Attending: Oncology | Primary: Oncology

## 2021-03-22 DIAGNOSIS — Z0000 Encounter for general adult medical examination without abnormal findings: Principal | ICD-10-CM

## 2021-03-22 DIAGNOSIS — C73 Malignant neoplasm of thyroid gland: Principal | ICD-10-CM

## 2021-03-22 DIAGNOSIS — E119 Type 2 diabetes mellitus without complications: Principal | ICD-10-CM

## 2021-03-22 DIAGNOSIS — E1169 Type 2 diabetes mellitus with other specified complication: Principal | ICD-10-CM

## 2021-03-22 DIAGNOSIS — I10 Essential (primary) hypertension: Principal | ICD-10-CM

## 2021-03-22 MED ORDER — GABAPENTIN 300 MG CAPSULE
ORAL_CAPSULE | Freq: Two times a day (BID) | ORAL | 2 refills | 30 days | Status: CP
Start: 2021-03-22 — End: 2022-03-22
  Filled 2021-03-22: qty 60, 30d supply, fill #0

## 2021-03-22 MED ORDER — ATORVASTATIN 80 MG TABLET
ORAL_TABLET | Freq: Every evening | ORAL | 2 refills | 30.00000 days | Status: CP
Start: 2021-03-22 — End: ?
  Filled 2021-03-22: qty 30, 30d supply, fill #0

## 2021-03-22 MED ORDER — LEVOTHYROXINE 175 MCG TABLET
ORAL_TABLET | Freq: Every day | ORAL | 2 refills | 30 days | Status: CP
Start: 2021-03-22 — End: ?
  Filled 2021-03-22: qty 30, 30d supply, fill #0

## 2021-03-22 MED ORDER — METFORMIN 500 MG TABLET
ORAL_TABLET | Freq: Two times a day (BID) | ORAL | 2 refills | 30 days | Status: CP
Start: 2021-03-22 — End: ?
  Filled 2021-03-22: qty 120, 30d supply, fill #0

## 2021-04-04 DIAGNOSIS — C73 Malignant neoplasm of thyroid gland: Principal | ICD-10-CM

## 2021-04-13 ENCOUNTER — Ambulatory Visit: Admit: 2021-04-13 | Payer: PRIVATE HEALTH INSURANCE

## 2021-04-13 ENCOUNTER — Ambulatory Visit
Admit: 2021-04-13 | Payer: PRIVATE HEALTH INSURANCE | Attending: Student in an Organized Health Care Education/Training Program | Primary: Student in an Organized Health Care Education/Training Program

## 2021-04-13 ENCOUNTER — Ambulatory Visit: Admit: 2021-04-13 | Payer: PRIVATE HEALTH INSURANCE | Attending: Oncology | Primary: Oncology

## 2021-04-27 ENCOUNTER — Other Ambulatory Visit: Admit: 2021-04-27 | Discharge: 2021-04-28 | Payer: PRIVATE HEALTH INSURANCE

## 2021-04-27 ENCOUNTER — Ambulatory Visit
Admit: 2021-04-27 | Discharge: 2021-04-28 | Payer: PRIVATE HEALTH INSURANCE | Attending: Student in an Organized Health Care Education/Training Program | Primary: Student in an Organized Health Care Education/Training Program

## 2021-04-27 ENCOUNTER — Ambulatory Visit: Admit: 2021-04-27 | Discharge: 2021-04-28 | Payer: PRIVATE HEALTH INSURANCE | Attending: Oncology | Primary: Oncology

## 2021-04-27 DIAGNOSIS — L0390 Cellulitis, unspecified: Principal | ICD-10-CM

## 2021-04-27 DIAGNOSIS — C73 Malignant neoplasm of thyroid gland: Principal | ICD-10-CM

## 2021-04-27 DIAGNOSIS — D649 Anemia, unspecified: Principal | ICD-10-CM

## 2021-04-27 MED ORDER — LEVOTHYROXINE 200 MCG TABLET
ORAL_TABLET | Freq: Every day | ORAL | 1 refills | 30 days | Status: CP
Start: 2021-04-27 — End: ?
  Filled 2021-04-27: qty 30, 30d supply, fill #0

## 2021-04-27 MED ORDER — LOSARTAN 50 MG TABLET
ORAL_TABLET | Freq: Every day | ORAL | 2 refills | 30 days | Status: CP
Start: 2021-04-27 — End: 2022-04-27
  Filled 2021-04-27: qty 30, 30d supply, fill #0

## 2021-04-27 MED ORDER — POLYETHYLENE GLYCOL 3350 17 GRAM/DOSE ORAL POWDER
Freq: Every day | ORAL | 1 refills | 30 days | Status: CP
Start: 2021-04-27 — End: ?
  Filled 2021-04-27: qty 510, 30d supply, fill #0

## 2021-04-27 MED ORDER — DOXYCYCLINE MONOHYDRATE 100 MG CAPSULE
ORAL_CAPSULE | Freq: Two times a day (BID) | ORAL | 0 refills | 14.00000 days | Status: CP
Start: 2021-04-27 — End: 2021-04-27
  Filled 2021-04-27: qty 14, 7d supply, fill #0

## 2021-05-25 ENCOUNTER — Ambulatory Visit: Admit: 2021-05-25 | Discharge: 2021-05-26 | Payer: PRIVATE HEALTH INSURANCE

## 2021-05-25 ENCOUNTER — Ambulatory Visit: Admit: 2021-05-25 | Discharge: 2021-05-26 | Payer: PRIVATE HEALTH INSURANCE | Attending: Oncology | Primary: Oncology

## 2021-05-25 ENCOUNTER — Ambulatory Visit
Admit: 2021-05-25 | Discharge: 2021-05-26 | Payer: PRIVATE HEALTH INSURANCE | Attending: Student in an Organized Health Care Education/Training Program | Primary: Student in an Organized Health Care Education/Training Program

## 2021-05-25 DIAGNOSIS — C73 Malignant neoplasm of thyroid gland: Principal | ICD-10-CM

## 2021-06-02 DIAGNOSIS — C73 Malignant neoplasm of thyroid gland: Principal | ICD-10-CM

## 2021-06-15 ENCOUNTER — Ambulatory Visit: Admit: 2021-06-15 | Discharge: 2021-06-15 | Payer: PRIVATE HEALTH INSURANCE | Attending: Oncology | Primary: Oncology

## 2021-06-15 ENCOUNTER — Other Ambulatory Visit: Admit: 2021-06-15 | Discharge: 2021-06-15 | Payer: PRIVATE HEALTH INSURANCE

## 2021-06-15 DIAGNOSIS — L02416 Cutaneous abscess of left lower limb: Principal | ICD-10-CM

## 2021-06-15 DIAGNOSIS — C73 Malignant neoplasm of thyroid gland: Principal | ICD-10-CM

## 2021-06-15 MED ORDER — GABAPENTIN 300 MG CAPSULE
ORAL_CAPSULE | Freq: Two times a day (BID) | ORAL | 2 refills | 90 days | Status: CP
Start: 2021-06-15 — End: 2022-06-15
  Filled 2021-06-15: qty 180, 90d supply, fill #0

## 2021-06-15 MED ORDER — BLOOD-GLUCOSE METER
0 refills | 0 days | Status: CP
Start: 2021-06-15 — End: ?
  Filled 2021-06-15: qty 100, 100d supply, fill #0

## 2021-06-15 MED ORDER — LANCETS
1 refills | 0 days | Status: CP
Start: 2021-06-15 — End: ?
  Filled 2021-06-15: qty 100, 100d supply, fill #0

## 2021-06-15 MED ORDER — ATORVASTATIN 80 MG TABLET
ORAL_TABLET | Freq: Every evening | ORAL | 2 refills | 90 days | Status: CP
Start: 2021-06-15 — End: ?
  Filled 2021-06-15: qty 90, 90d supply, fill #0

## 2021-06-15 MED ORDER — METFORMIN 500 MG TABLET
ORAL_TABLET | Freq: Two times a day (BID) | ORAL | 2 refills | 90 days | Status: CP
Start: 2021-06-15 — End: ?
  Filled 2021-06-15: qty 360, 90d supply, fill #0

## 2021-06-15 MED ORDER — PEN NEEDLE, DIABETIC 32 GAUGE X 5/32" (4 MM)
1 refills | 0 days | Status: CP
Start: 2021-06-15 — End: ?

## 2021-06-15 MED ORDER — ACCU-CHEK GUIDE TEST STRIPS
ORAL_STRIP | 1 refills | 0 days | Status: CP
Start: 2021-06-15 — End: ?
  Filled 2021-06-15: qty 100, 100d supply, fill #0

## 2021-06-15 MED ORDER — LOSARTAN 50 MG TABLET
ORAL_TABLET | Freq: Every day | ORAL | 2 refills | 90 days | Status: CP
Start: 2021-06-15 — End: 2022-06-15
  Filled 2021-06-15: qty 90, 90d supply, fill #0

## 2021-06-15 MED ORDER — CLINDAMYCIN HCL 300 MG CAPSULE
ORAL_CAPSULE | Freq: Four times a day (QID) | ORAL | 0 refills | 7 days | Status: CP
Start: 2021-06-15 — End: 2021-06-22
  Filled 2021-06-15: qty 28, 7d supply, fill #0

## 2021-06-15 MED ORDER — INSULIN GLARGINE (U-100) 100 UNIT/ML (3 ML) SUBCUTANEOUS PEN
Freq: Every evening | SUBCUTANEOUS | 0 refills | 150 days | Status: CP
Start: 2021-06-15 — End: 2022-06-15
  Filled 2021-06-15: qty 15, 140d supply, fill #0

## 2021-06-15 MED ORDER — LEVOTHYROXINE 200 MCG TABLET
ORAL_TABLET | Freq: Every day | ORAL | 1 refills | 90 days | Status: CP
Start: 2021-06-15 — End: ?
  Filled 2021-06-15: qty 90, 90d supply, fill #0

## 2021-06-15 MED FILL — ACCU-CHEK GUIDE GLUCOSE METER: 30 days supply | Qty: 1 | Fill #0

## 2021-06-17 DIAGNOSIS — L0291 Cutaneous abscess, unspecified: Principal | ICD-10-CM

## 2021-06-17 MED ORDER — CEPHALEXIN 500 MG CAPSULE
ORAL_CAPSULE | Freq: Four times a day (QID) | ORAL | 0 refills | 7 days | Status: CP
Start: 2021-06-17 — End: 2021-06-24
  Filled 2021-06-28: qty 28, 7d supply, fill #0

## 2021-06-21 DIAGNOSIS — C73 Malignant neoplasm of thyroid gland: Principal | ICD-10-CM

## 2021-06-22 ENCOUNTER — Ambulatory Visit: Admit: 2021-06-22 | Payer: PRIVATE HEALTH INSURANCE

## 2021-06-23 ENCOUNTER — Ambulatory Visit: Admit: 2021-06-23 | Payer: PRIVATE HEALTH INSURANCE

## 2021-06-27 ENCOUNTER — Ambulatory Visit
Admit: 2021-06-27 | Discharge: 2021-06-27 | Disposition: A | Discharge status: Home / Self Care | Payer: PRIVATE HEALTH INSURANCE

## 2021-06-27 ENCOUNTER — Emergency Department
Admit: 2021-06-27 | Discharge: 2021-06-27 | Disposition: A | Discharge status: Home / Self Care | Payer: PRIVATE HEALTH INSURANCE

## 2021-06-27 DIAGNOSIS — R0602 Shortness of breath: Principal | ICD-10-CM

## 2021-06-27 DIAGNOSIS — R079 Chest pain, unspecified: Principal | ICD-10-CM

## 2021-06-27 MED ORDER — OXYCODONE 5 MG TABLET
ORAL_TABLET | ORAL | 0 refills | 1 days | Status: CP | PRN
Start: 2021-06-27 — End: 2021-07-02

## 2021-06-28 MED ORDER — OXYCODONE 5 MG TABLET
ORAL_TABLET | ORAL | 0 refills | 1 days | Status: CP | PRN
Start: 2021-06-28 — End: 2021-07-03
  Filled 2021-06-28: qty 5, 1d supply, fill #0

## 2021-07-26 DIAGNOSIS — C73 Malignant neoplasm of thyroid gland: Principal | ICD-10-CM

## 2021-07-27 ENCOUNTER — Ambulatory Visit: Admit: 2021-07-27 | Discharge: 2021-07-28 | Payer: PRIVATE HEALTH INSURANCE | Attending: Oncology | Primary: Oncology

## 2021-07-27 ENCOUNTER — Ambulatory Visit
Admit: 2021-07-27 | Discharge: 2021-07-28 | Payer: PRIVATE HEALTH INSURANCE | Attending: Student in an Organized Health Care Education/Training Program | Primary: Student in an Organized Health Care Education/Training Program

## 2021-07-27 ENCOUNTER — Ambulatory Visit: Admit: 2021-07-27 | Discharge: 2021-07-28 | Payer: PRIVATE HEALTH INSURANCE

## 2021-08-16 DIAGNOSIS — R911 Solitary pulmonary nodule: Principal | ICD-10-CM

## 2021-08-16 DIAGNOSIS — C73 Malignant neoplasm of thyroid gland: Principal | ICD-10-CM

## 2021-09-07 ENCOUNTER — Ambulatory Visit: Admit: 2021-09-07 | Payer: PRIVATE HEALTH INSURANCE

## 2021-09-07 ENCOUNTER — Ambulatory Visit: Admit: 2021-09-07 | Payer: PRIVATE HEALTH INSURANCE | Attending: Oncology | Primary: Oncology

## 2021-09-27 DIAGNOSIS — C73 Malignant neoplasm of thyroid gland: Principal | ICD-10-CM

## 2021-09-29 ENCOUNTER — Ambulatory Visit: Admit: 2021-09-29 | Discharge: 2021-09-30 | Payer: PRIVATE HEALTH INSURANCE

## 2021-10-05 ENCOUNTER — Other Ambulatory Visit: Admit: 2021-10-05 | Discharge: 2021-10-06 | Payer: PRIVATE HEALTH INSURANCE

## 2021-10-05 ENCOUNTER — Ambulatory Visit: Admit: 2021-10-05 | Discharge: 2021-10-06 | Payer: PRIVATE HEALTH INSURANCE | Attending: Oncology | Primary: Oncology

## 2021-10-05 ENCOUNTER — Ambulatory Visit
Admit: 2021-10-05 | Discharge: 2021-10-06 | Payer: PRIVATE HEALTH INSURANCE | Attending: Student in an Organized Health Care Education/Training Program | Primary: Student in an Organized Health Care Education/Training Program

## 2021-10-05 DIAGNOSIS — C73 Malignant neoplasm of thyroid gland: Principal | ICD-10-CM

## 2021-10-05 DIAGNOSIS — E1169 Type 2 diabetes mellitus with other specified complication: Principal | ICD-10-CM

## 2021-10-05 LAB — CBC W/ AUTO DIFF
BASOPHILS ABSOLUTE COUNT: 0 10*9/L (ref 0.0–0.1)
BASOPHILS RELATIVE PERCENT: 0.4 %
EOSINOPHILS ABSOLUTE COUNT: 0.1 10*9/L (ref 0.0–0.5)
EOSINOPHILS RELATIVE PERCENT: 1 %
HEMATOCRIT: 39.6 % (ref 39.0–48.0)
HEMOGLOBIN: 12.9 g/dL (ref 12.9–16.5)
LYMPHOCYTES ABSOLUTE COUNT: 1 10*9/L — ABNORMAL LOW (ref 1.1–3.6)
LYMPHOCYTES RELATIVE PERCENT: 17.9 %
MEAN CORPUSCULAR HEMOGLOBIN CONC: 32.5 g/dL (ref 32.0–36.0)
MEAN CORPUSCULAR HEMOGLOBIN: 24.6 pg — ABNORMAL LOW (ref 25.9–32.4)
MEAN CORPUSCULAR VOLUME: 75.6 fL — ABNORMAL LOW (ref 77.6–95.7)
MEAN PLATELET VOLUME: 8.8 fL (ref 6.8–10.7)
MONOCYTES ABSOLUTE COUNT: 0.2 10*9/L — ABNORMAL LOW (ref 0.3–0.8)
MONOCYTES RELATIVE PERCENT: 4.3 %
NEUTROPHILS ABSOLUTE COUNT: 4.2 10*9/L (ref 1.8–7.8)
NEUTROPHILS RELATIVE PERCENT: 76.4 %
PLATELET COUNT: 223 10*9/L (ref 150–450)
RED BLOOD CELL COUNT: 5.24 10*12/L (ref 4.26–5.60)
RED CELL DISTRIBUTION WIDTH: 14.9 % (ref 12.2–15.2)
WBC ADJUSTED: 5.5 10*9/L (ref 3.6–11.2)

## 2021-10-05 LAB — COMPREHENSIVE METABOLIC PANEL
ALBUMIN: 3.1 g/dL — ABNORMAL LOW (ref 3.4–5.0)
ALKALINE PHOSPHATASE: 116 U/L (ref 46–116)
ALT (SGPT): 46 U/L (ref 10–49)
ANION GAP: 5 mmol/L (ref 5–14)
AST (SGOT): 50 U/L — ABNORMAL HIGH (ref <=34)
BILIRUBIN TOTAL: 0.2 mg/dL — ABNORMAL LOW (ref 0.3–1.2)
BLOOD UREA NITROGEN: 10 mg/dL (ref 9–23)
BUN / CREAT RATIO: 10
CALCIUM: 8.7 mg/dL (ref 8.7–10.4)
CHLORIDE: 99 mmol/L (ref 98–107)
CO2: 29 mmol/L (ref 20.0–31.0)
CREATININE: 1.02 mg/dL
EGFR CKD-EPI (2021) MALE: 88 mL/min/{1.73_m2} (ref >=60)
GLUCOSE RANDOM: 426 mg/dL (ref 70–179)
POTASSIUM: 3.9 mmol/L (ref 3.4–4.8)
PROTEIN TOTAL: 6.7 g/dL (ref 5.7–8.2)
SODIUM: 133 mmol/L — ABNORMAL LOW (ref 135–145)

## 2021-10-05 LAB — LIPID PANEL
CHOLESTEROL/HDL RATIO SCREEN: 2.4 (ref 1.0–4.5)
CHOLESTEROL: 239 mg/dL — ABNORMAL HIGH (ref <=200)
HDL CHOLESTEROL: 100 mg/dL — ABNORMAL HIGH (ref 40–60)
LDL CHOLESTEROL CALCULATED: 115 mg/dL — ABNORMAL HIGH (ref 40–99)
NON-HDL CHOLESTEROL: 139 mg/dL — ABNORMAL HIGH (ref 70–130)
TRIGLYCERIDES: 119 mg/dL (ref 0–150)
VLDL CHOLESTEROL CAL: 23.8 mg/dL (ref 12–47)

## 2021-10-05 LAB — SLIDE REVIEW

## 2021-10-05 LAB — HEMOGLOBIN A1C
ESTIMATED AVERAGE GLUCOSE: 324 mg/dL
HEMOGLOBIN A1C: 12.9 % — ABNORMAL HIGH (ref 4.8–5.6)

## 2021-10-05 LAB — T4, FREE: FREE T4: 0.37 ng/dL — ABNORMAL LOW (ref 0.89–1.76)

## 2021-10-05 LAB — TSH: THYROID STIMULATING HORMONE: 56.56 u[IU]/mL — ABNORMAL HIGH (ref 0.550–4.780)

## 2021-10-05 MED ORDER — LEVOTHYROXINE 200 MCG TABLET
ORAL_TABLET | Freq: Every day | ORAL | 1 refills | 90 days | Status: CP
Start: 2021-10-05 — End: ?
  Filled 2021-10-13: qty 90, 90d supply, fill #0

## 2021-10-05 MED ORDER — ATORVASTATIN 80 MG TABLET
ORAL_TABLET | Freq: Every evening | ORAL | 2 refills | 90 days | Status: CP
Start: 2021-10-05 — End: ?
  Filled 2021-10-13: qty 90, 90d supply, fill #0

## 2021-10-05 MED ORDER — HYDROCHLOROTHIAZIDE 12.5 MG TABLET
ORAL_TABLET | Freq: Every day | ORAL | 1 refills | 90 days | Status: CP
Start: 2021-10-05 — End: ?
  Filled 2021-10-13: qty 90, 90d supply, fill #0

## 2021-10-05 MED ORDER — BLOOD-GLUCOSE METER KIT WRAPPER
0 refills | 0 days | Status: CP
Start: 2021-10-05 — End: 2022-10-05
  Filled 2021-10-13: qty 100, 100d supply, fill #0

## 2021-10-05 MED ORDER — GABAPENTIN 300 MG CAPSULE
ORAL_CAPSULE | Freq: Two times a day (BID) | ORAL | 2 refills | 90 days | Status: CP
Start: 2021-10-05 — End: 2022-10-05
  Filled 2021-10-13: qty 180, 90d supply, fill #0

## 2021-10-05 MED ORDER — ACCU-CHEK GUIDE TEST STRIPS
ORAL_STRIP | 1 refills | 0 days | Status: CP
Start: 2021-10-05 — End: ?
  Filled 2021-10-13: qty 100, 100d supply, fill #0

## 2021-10-05 MED ORDER — LANCETS
1 refills | 0 days | Status: CP
Start: 2021-10-05 — End: ?
  Filled 2021-10-13: qty 100, 100d supply, fill #0

## 2021-10-05 MED ORDER — INSULIN GLARGINE (U-100) 100 UNIT/ML (3 ML) SUBCUTANEOUS PEN
Freq: Every evening | SUBCUTANEOUS | 0 refills | 150 days | Status: CP
Start: 2021-10-05 — End: 2022-10-05
  Filled 2021-10-13: qty 15, 140d supply, fill #0

## 2021-10-05 MED ORDER — PEN NEEDLE, DIABETIC 32 GAUGE X 5/32" (4 MM)
1 refills | 0 days | Status: CP
Start: 2021-10-05 — End: ?

## 2021-10-05 MED ORDER — LOSARTAN 50 MG TABLET
ORAL_TABLET | Freq: Every day | ORAL | 2 refills | 90 days | Status: CP
Start: 2021-10-05 — End: 2022-10-05
  Filled 2021-10-13: qty 90, 90d supply, fill #0

## 2021-10-05 MED ORDER — METFORMIN 500 MG TABLET
ORAL_TABLET | Freq: Two times a day (BID) | ORAL | 2 refills | 90 days | Status: CP
Start: 2021-10-05 — End: ?
  Filled 2021-10-13: qty 360, 90d supply, fill #0

## 2021-10-05 NOTE — Unmapped (Signed)
 Clinical Pharmacist Practitioner: [facility]    Patient Name: Paul Hines  Patient Age: 51 y.o.  Encounter Date: 10/05/2021  Primary Oncologist: Ginette Otto, DO, MPH    Reason for visit: oral chemotherapy follow-up    ASSESSMENT & PLAN:  1. Metastatic PTC: patient started on Lenvima on 02/21/21 and was tolerating it well, however has been off for >1 month due to miscommunication regarding scans as well as difficulty with manufacturer reaching patient by phone for refills. Will plan to restart Lenvima now. As his insurance is now [place], he can receive from [facility] instead of manufacturer.  Restart Lenvima 18 mg daily; new script sent to [facility]    Thyroglobulin, Tumor Marker, IA (ng/mL)   Date Value   10/05/2021 662 (H)   06/15/2021 502 (H)   02/01/2021 323 (H)   01/04/2021 212 (H)   06/24/2020 43 (H)        2. Type 2 diabetes: glucose today high at 426. Hgb A1c checked today and high at 12.9%. Restarted metformin and long acting insulin at prior visit on 06/15/21 and referral placed to IM to establish care. Has not been compliant with refills for meds so will send all meds to [facility] who can ship meds to patient. Will restart T2DM medications today to better control BG. Will reach back out to IM clinic for appointment.  Restart metformin 1000 mg BID and Basaglar 10 units SQ qhs; reordered diabetic testing supplies   Encouraged testing BG at least once daily and recording values to bring to follow up appointments    3. Postsurgical hypothyroidism: goal for TSH suppression (<0.1). TSH today high at 56.566 and free T4 low at 0.37 due to patient being off all meds for >1 month. Explained to patient that his fatigue is likely attributed to his hypothyroidism and non-adherence to levothyroxine.  Restart levothyroxine 200 mcg daily; 90 DS sent to [facility]    4. HTN: clinic BP above goal at 163/116 today, but no home readings. Has been off antihypertensives for >1 month.  Restart losartan 50 mg daily and hydrochlorothiazide 12.5 mg daily; 90 DS sent to [facility]    5. Peripheral neuropathy: present in hands, likely 2/2 T2DM. Moderately improved in past with gabapentin.  Restart gabapentin 300 mg BID; 90 DS sent to [facility]    6. H/o ischemic cardiac disease s/p catheterization: refer to IM clinic. Lipid panel today elevated with cholesterol 239, HDL 100, LDL 115, non HDL 562.   Restart atorvastatin 80 mg daily; 90 DS sent to [facility]    F/U: RTC in 1 month    ______________________________________________________________________    HPI: Paul Hines is a 51 y.o. male with history of T2DM, hypertension, ischemic cardiac disease s/p catheterization in 2021 and metastatic thyroid cancer, s/p resection with Dr. Azucena Fallen on 04/12/20 and neck dissection on 07/28/20. Started Lenvima on 02/21/21 although has had issues with noncompliance.     Oral chemotherapy: Lenvima 18 mg daily  Start date: 02/21/21 although has been nonadherent   Specialty pharmacy: [facility]  Copay: $0    Interim History:   - confirmed with patient that address and phone number in EPIC are accurate   - he states he has pills for his blood pressure, blood sugar, insulin, and thyroid at home. He takes them when he remembers but does not recall the names of his medications  - he is out of gabapentin and would like refills   -  he states he does not have lenvima at home and was previously instructed to wait until after his scans to start taking it   - he endorses fatigue likely attributed to nonadherence to synthroid   - he denies symptoms of hyperglycemia such as dizziness or double vision    Adherence: states he takes his medications when he remembers. Lab values today indicative of non-adherence   Drug-Drug Interactions: none per current med list    Oncology History:  Oncology History   Thyroid cancer (CMS-HCC)   03/09/2020 Initial Diagnosis    Thyroid cancer (CMS-HCC)     07/29/2021 -  Cancer Staged    Staging form: Thyroid - Differentiated, AJCC 8th Edition  - Clinical: Stage II (cM1, Age at diagnosis: < 55 years) - Signed by Dionicia Abler, DO on 07/29/2021           Problem List:   Patient Active Problem List   Diagnosis    Abnormal computed tomography angiography (CTA)    Thyroid cancer (CMS-HCC)    Obesity    Right inguinal hernia    Diabetes (CMS-HCC)    Essential hypertension       Medications:  Current Outpatient Medications   Medication Sig Dispense Refill    atorvastatin (LIPITOR) 80 MG tablet Take 1 tablet (80 mg total) by mouth nightly. 90 tablet 2    blood sugar diagnostic (ACCU-CHEK GUIDE TEST STRIPS) Strp Test daily. 100 strip 1    blood-glucose meter kit Test blood glucose level once daily. 1 each 0    gabapentin (NEURONTIN) 300 MG capsule Take 1 capsule (300 mg total) by mouth two (2) times a day. 180 capsule 2    hydroCHLOROthiazide (HYDRODIURIL) 12.5 MG tablet Take 1 tablet (12.5 mg total) by mouth daily. 90 tablet 1    insulin glargine (BASAGLAR, LANTUS) 100 unit/mL (3 mL) injection pen Inject 0.1 mL (10 Units total) under the skin nightly. 15 mL 0    lancets Misc Test daily. 100 each 1    lenvatinib 18 mg/day (10 mg x 1 AND 4 mg x 2) cap Take 3 capsules (18 mg) by mouth daily. 90 capsule 2    levothyroxine (SYNTHROID) 200 MCG tablet Take 1 tablet (200 mcg total) by mouth daily. 90 tablet 1    losartan (COZAAR) 50 MG tablet Take 1 tablet (50 mg total) by mouth daily. 90 tablet 2    metFORMIN (GLUCOPHAGE) 500 MG tablet Take 2 tablets (1,000 mg total) by mouth in the morning and 2 tablets (1,000 mg total) in the evening. Take with meals. 360 tablet 2    pen needle, diabetic 32 gauge x 5/32 (4 mm) Ndle Use with insulin glargine once daily 100 each 1     No current facility-administered medications for this visit.       Allergies:  Allergies   Allergen Reactions    Fish Derived     Lisinopril     Peanut Butter Flavor      Per allergy testing    Penicillins Other (See Comments)     Per patient, when he took at age 27 his legs and arms shook. Not sure if it was seizure or not.     Shellfish Containing Products     Soy        Personal and Social History:   Social History     Tobacco Use    Smoking status: Former     Types: Cigarettes     Quit date: 2018  Years since quitting: 5.6    Smokeless tobacco: Never   Substance Use Topics    Alcohol use: Not on file   He reports that he does not currently use drugs after having used the following drugs: Marijuana.    Family History:  His Family history is unknown by patient.    Review of Systems: A complete review of systems was obtained including: Constitutional, Eyes, ENT, Cardiovascular, Respiratory, GI, GU, Musculoskeletal, Skin, Neurological, Psychiatric, Endocrine, Heme/Lymphatic, and Allergic/Immunologic systems. All other systems reviewed and are negative to the patient???s management except for what was mentioned in the interim history.     Vital Signs: There were no vitals taken for this visit.    Laboratory Data:  Lab on 10/05/2021   Component Date Value    Free T4 10/05/2021 0.37 (L)     TSH 10/05/2021 56.560 (H)     Sodium 10/05/2021 133 (L)     Potassium 10/05/2021 3.9     Chloride 10/05/2021 99     CO2 10/05/2021 29.0     Anion Gap 10/05/2021 5     BUN 10/05/2021 10     Creatinine 10/05/2021 1.02     BUN/Creatinine Ratio 10/05/2021 10     eGFR CKD-EPI (2021) Male 10/05/2021 88     Glucose 10/05/2021 426 (HH)     Calcium 10/05/2021 8.7     Albumin 10/05/2021 3.1 (L)     Total Protein 10/05/2021 6.7     Total Bilirubin 10/05/2021 0.2 (L)     AST 10/05/2021 50 (H)     ALT 10/05/2021 46     Alkaline Phosphatase 10/05/2021 116     WBC 10/05/2021 5.5     RBC 10/05/2021 5.24     HGB 10/05/2021 12.9     HCT 10/05/2021 39.6     MCV 10/05/2021 75.6 (L)     MCH 10/05/2021 24.6 (L)     MCHC 10/05/2021 32.5     RDW 10/05/2021 14.9     MPV 10/05/2021 8.8     Platelet 10/05/2021 223     Neutrophils % 10/05/2021 76.4     Lymphocytes % 10/05/2021 17.9     Monocytes % 10/05/2021 4.3     Eosinophils % 10/05/2021 1.0     Basophils % 10/05/2021 0.4     Absolute Neutrophils 10/05/2021 4.2     Absolute Lymphocytes 10/05/2021 1.0 (L)     Absolute Monocytes 10/05/2021 0.2 (L)     Absolute Eosinophils 10/05/2021 0.1     Absolute Basophils 10/05/2021 0.0     Microcytosis 10/05/2021 Slight (A)     Hypochromasia 10/05/2021 Marked (A)         I spent 30 minutes with Mr.Mizrachi in direct patient care.    Erling Conte, PharmD  PGY-2 Oncology Pharmacy Resident     Bobby Rumpf, PharmD, BCOP, CPP  Clinical Pharmacist Practitioner, Melanoma and Head & Neck Oncology

## 2021-10-05 NOTE — Unmapped (Signed)
TC to Mr. Choice. He was unaware of an appt today. He stated that he can get here by noon today. Team notified.

## 2021-10-05 NOTE — Unmapped (Signed)
[facility] Specialty Medication Onboarding    Specialty Medication: Lenvima  Prior Authorization: Not Required   Financial Assistance: No - copay  <$25  Final Copay/Day Supply: $4 / 30    Insurance Restrictions: None     Notes to Pharmacist:     The triage team has completed the benefits investigation and has determined that the patient is able to fill this medication at [facility]. Please contact the patient to complete the onboarding or follow up with the prescribing physician as needed.

## 2021-10-05 NOTE — Unmapped (Signed)
Head and Neck Oncology Clinic  PCP: Paul Dun, MD    Consulting providers:  Otolaryngology: Dr. Azucena Hines    Reason for Visit: office visit, follow up today to discuss scan and further treatment management.    Assessment/Plan:    Paul Hines is a 51 y.o. with history of T2DM, hypertension, ischemic cardiac disease s/p catheterization in 2021 and metastatic thyroid cancer, s/p resection with Dr. Azucena Hines on 04/12/20 and neck dissection on 07/28/20. Unfortunately developed numerous pulmonary metastases noted on PET/CT on 01/14/21. We initiated him on palliative treatment with Lenvima 18 mg daily on 02/21/21. He presents today in follow up on treatment.     # Metastatic Papillary Thyroid Cancer, BRAF WT  - Treatment goals are palliative.   - Tg: 75 (02/17/2020) --> 43 (06/24/2020) --> 212 (01/04/2021) --> 323 (02/01/21) ---> 502 (06/15/21) --> 662 (10/05/2021)  - Currently on Lenvima (02/21/21- current); very poor compliance due to housing issues and not having a phone  - Reports that tolerance is good  - PET/CT (09/29/2021): disease progression  - We discusses that scan results are not surprising given minimal Lenvima use  - We discussed the importance of compliance with Lenvima given PD.   - Refill prescriptions of Lenvima and Gabapentin were given today.  - RTC in 1 month to repeat scans and lab work.    # Hypertension, poor control  - 163/116 today  - Continue Lorsartin 50mg   - Encouraged medical compliance    # Diabetes, High Glucose level today  - Poor control  - Medication refill  - We also discussed importance of compliance on insulin.    # Hypothyroidism  - TSH labs today 56.56 T4 0.37  - Continue dose of synthroid 200 mcg once daily   - Goal TSH suppression to <0.1    # Skin abcesses, improved    # Supportive care  - Cancer-related pain: Continue on Gabapentin 300mg   - Psychosocial: denies any need for CCSP at this time.   - Constipation: Recommended Miralax     Follow up: RTC 1 month.    Paul Hines   Head and Neck Medical Oncology  [employer] [employer]   --------------------------  Interval History  - Continues to endorse mild fatigue with productive cough and SOB. This has not significantly worsened over the past few months  - Reports not taking Lenvima over past month and has had large gaps of use  - When taking, denies NVD,  skin rash, mucositis  - Has had difficulty with housing  - Also admits to poor compliance with other medications    History of Present Illness:  Paul Hines is a 51 y.o. male with history of T2DM, hypertension, ischemic heart disease s/p cardiac catheterization (07/2019) who presents for evaluation of head and neck cancer. I have reviewed his records including history, imaging, pathology reports, and, when applicable, operative notes and summarized his oncologic history below:     Patient was incidentally diagnosed on workup for chest pain in 2021. CTA cardiac (07/08/2019) showed bilateral pulmonary nodules and repeat imaging from 10/2019 and 12/2019 redemonstrated nodules. PET/CT obtained (10/2019) and was notable for hypermetabolic right hilar and mediastinal lymph nodes. FNA performed on 01/09/2020 to dominant right upper pulmonary nodule and confirmed papillary thyroid carcinoma. Further biopsy of lymph nodes were negative for malignancy.      He was evaluated by Dr. Azucena Hines in February 2022. At that time he reports intermittent dysphagia, but was not significantly bothersome and did  not limit his diet. He otherwise denied palpable neck mass, voice changes, otalgia or weight loss. Pre-treatment Tg was 75 in January 2022. Patient underwent total thyroidectomy on 04/12/2020 with bilateral neck dissection on 07/28/2020. Final pathology consistent with PTC, lymph node involvement (14/14) with positive ENE. Received adjuvant RAI (200 mCi I-131) on 05/20/2020.      PET/CT (01/14/2021) concerning for numerous pulmonary nodules that had increased in size when compared to prior imaging. Otherwise, no evidence of distant disease. Tumor marker was also noted to be elevated at 212 (01/04/2021).      Today: Present unaccompanied today, feeling well. He does report residual numbness all along the underside of his face. Previously smoked 2 packs of cigarettes per day for 14 years. Currently smoking ~3 cigarettes daily. He is not currently employed, but would like to work if possible. He was recently been released from prison and is currently staying with [facility].       Past Medical History:   Diagnosis Date    Abnormal findings on dx imaging of heart and cor circ     Acne vulgaris     Allergic contact dermatitis due to food in contact with skin     Atherosclerosis     Athscl heart disease of native coronary artery w/o ang pctrs     Chronic ischemic heart disease     Cutaneous abscess     Diabetic polyneuropathy (CMS-HCC)     Disorder of skin and subcutaneous tissue     Essential hypertension     H/O medication noncompliance     Hernia, inguinal, unilateral     Hypertrophic cardiomyopathy (CMS-HCC)     Malignant neoplasm of thyroid gland (CMS-HCC)     Presbyopia     Pseudofolliculitis barbae     Shoulder pain, bilateral     Tinea unguium     Type 2 diabetes mellitus (CMS-HCC)     Xerosis cutis        Past Surgical History:   Procedure Laterality Date    PR BRNCHSC EBUS GUIDED SAMPL 1/2 NODE STATION/STRUX N/A 01/09/2020    Procedure: BRONCH, RIGID OR FLEXIBLE, INC FLUORO GUIDANCE, WHEN PERFORMED; WITH EBUS GUIDED TRANSTRACHEAL AND/OR TRANSBRONCHIAL SAMPLING, ONE OR TWO MEDIASTINAL AND/OR HILAR LYMPH NODE STATIONS OR STRUCTURES;  Surgeon: Jerelyn Charles, MD;  Location: MAIN OR [place];  Service: Pulmonary    PR BRNSCHSC TNDSC EBUS DX/TX INTERVENTION PERPH LES Right 01/09/2020    Procedure: BRONCH, RIGID OR FLEXIBLE, INCLUDING FLUORO GUIDANCE, WHEN PERFORMED; WITH TRANSENDOSCOPIC EBUS DURING BRONCHOSCOPIC DIAGNOSTIC OR THERAPEUTIC INTERVENTION(S) FOR PERIPHERAL LESION(S);  Surgeon: Jerelyn Charles, MD;  Location: MAIN OR [place];  Service: Pulmonary    PR BRONCHOSCOPY,COMPUTER ASSIST/IMAGE-GUIDED NAVIGATION Right 01/09/2020    Procedure: ROBOT ION BRONCHOSCOPY,RIGID OR FLEXIBLE,INCLUDE FLUORO WHEN PERFORMED; W/COMPUTER-ASSIST,IMAGE-GUIDED NAVIGATION;  Surgeon: Jerelyn Charles, MD;  Location: MAIN OR [place];  Service: Pulmonary    PR BRONCHOSCOPY,DIAGNOSTIC W LAVAGE Right 01/09/2020    Procedure: BRONCHOSCOPY, RIGID OR FLEXIBLE, INCLUDE FLUOROSCOPIC GUIDANCE WHEN PERFORMED; W/BRONCHIAL ALVEOLAR LAVAGE;  Surgeon: Jerelyn Charles, MD;  Location: MAIN OR [place];  Service: Pulmonary    PR BRONCHOSCOPY,TRANSBRON ASPIR BX Right 01/09/2020    Procedure: BRONCHOSCOPY, RIGID/FLEX, INCL FLUORO; W/TRANSBRONCH NDL ASPIRAT BX, TRACHEA, MAIN STEM &/OR LOBAR BRONCHUS;  Surgeon: Jerelyn Charles, MD;  Location: MAIN OR [place];  Service: Pulmonary    PR BRONCHOSCOPY,TRANSBRONCH BIOPSY Right 01/09/2020    Procedure: BRONCHOSCOPY, RIGID/FLEXIBLE, INCLUDE FLUORO GUIDANCE WHEN PERFORMED; W/TRANSBRONCHIAL LUNG BX, SINGLE LOBE;  Surgeon: Barbara Cower  Laray Anger, MD;  Location: MAIN OR [facility];  Service: Pulmonary    PR CATH PLACE/CORON ANGIO, IMG SUPER/INTERP,W LEFT HEART VENTRICULOGRAPHY N/A 08/26/2019    Procedure: Left Heart Catheterization;  Surgeon: Marlaine Hind, MD;  Location: [facility] CATH;  Service: Cardiology    PR LIGATN THOR DUCT,CERV APPROACH Bilateral 07/28/2020    Procedure: SUTURE &/OR LIG THORACIC DUCT; CERV APPROACH;  Surgeon: Lauralee Evener, MD;  Location: MAIN OR [facility];  Service: ENT    PR REMOVAL NODES, NECK,CERV MOD RAD Bilateral 07/28/2020    Procedure: CERVICAL LYMPHADENECTOMY (MODIFIED RADICAL NECK DISSECTION);  Surgeon: Lauralee Evener, MD;  Location: MAIN OR [facility];  Service: ENT    PR THYROIDECTOMY,MALIG,LTD NECK SURG Bilateral 04/12/2020    Procedure: THYROIDECTOMY, TOTAL OR SUBTOTAL FOR MALIGNANCY; WITH LIMITED NECK DISSECTION;  Surgeon: Lauralee Evener, MD;  Location: MAIN OR [facility];  Service: ENT       Current Outpatient Medications   Medication Sig Dispense Refill    atorvastatin (LIPITOR) 80 MG tablet Take 1 tablet (80 mg total) by mouth nightly. 90 tablet 2    blood sugar diagnostic (ACCU-CHEK GUIDE TEST STRIPS) Strp Test daily. 100 strip 1    blood-glucose meter Misc Test blood glucose level once daily. 1 each 0    gabapentin (NEURONTIN) 300 MG capsule Take 1 capsule (300 mg total) by mouth two (2) times a day. 180 capsule 2    insulin glargine (BASAGLAR, LANTUS) 100 unit/mL (3 mL) injection pen Inject 0.1 mL (10 Units total) under the skin nightly. 15 mL 0    lancets Misc Test daily. 100 each 1    lenvatinib 18 mg/day (10 mg x 1 AND 4 mg x 2) cap Take 18 mg by mouth daily. (Patient not taking: Reported on 06/15/2021) 30 capsule 2    levothyroxine (SYNTHROID) 200 MCG tablet Take 1 tablet (200 mcg total) by mouth daily. 90 tablet 1    losartan (COZAAR) 50 MG tablet Take 1 tablet (50 mg total) by mouth daily. 90 tablet 2    metFORMIN (GLUCOPHAGE) 500 MG tablet Take 2 tablets (1,000 mg total) by mouth in the morning and 2 tablets (1,000 mg total) in the evening. Take with meals. 360 tablet 2    pen needle, diabetic 32 gauge x 5/32 (4 mm) Ndle Use with insulin glargine once daily 100 each 1    polyethylene glycol (GLYCOLAX) 17 gram/dose powder Mix 1 capful (17 g) in 4 to 8 ounces of water, tea, juice, soda or coffee and drink daily. (Patient not taking: Reported on 06/15/2021) 510 g 1     No current facility-administered medications for this visit.       Allergies   Allergen Reactions    Fish Derived     Lisinopril     Peanut Butter Flavor      Per allergy testing    Penicillins Other (See Comments)     Per patient, when he took at age 38 his legs and arms shook. Not sure if it was seizure or not.     Shellfish Containing Products     Soy        Social History     Tobacco Use    Smoking status: Former     Types: Cigarettes     Quit date: 2018     Years since quitting: 5.6    Smokeless tobacco: Never   Substance Use Topics    Drug use: Not Currently     Types: Marijuana  Comment: quit in 1992       Social History     Social History Narrative    Not on file       Family History   Family history unknown: Yes       Review of Systems: A 12-system review of systems was obtained including: Constitutional, Eyes, ENT, Cardiovascular, Respiratory, GI, GU, Musculoskeletal, Skin, Neurological, Psychiatric, Endocrine, Heme/Lymphatic, and Allergic/Immunologic systems. It is negative or non-contributory to the patient???s management except for as stated in patient's HPI    ECOG PS: 0    Physical Examination:  Vital Signs: BP (S) 163/116  - Pulse 71  - Temp 36.7 ??C (98.1 ??F) (Temporal)  - Resp 18  - Ht 170.2 cm (5' 7)  - Wt 80.7 kg (178 lb)  - SpO2 100%  - BMI 27.88 kg/m??   CONSTITUTIONAL: Well appearing man in NAD  Oral Cavity: MMM, no oral lesions or mucositis  Lymphatics: No lymphadenopathy   CV: RRR; no lower extremity edema  RESP: normal work of breathing  GI: Soft, non-tender, non-distended  SKIN: No skin rashes  NEURO: no focal deficits appreciated  PSYCH: Normal mood and appropriate affect    LABS  Lab Results   Component Value Date    WBC 5.5 10/05/2021    HGB 12.9 10/05/2021    HCT 39.6 10/05/2021    PLT 223 10/05/2021       Lab Results   Component Value Date    NA 133 (L) 10/05/2021    K 3.9 10/05/2021    CL 99 10/05/2021    CO2 29.0 10/05/2021    BUN 10 10/05/2021    CREATININE 1.02 10/05/2021    GLU 426 (HH) 10/05/2021    CALCIUM 8.7 10/05/2021    MG 1.6 07/31/2020    PHOS 3.0 07/31/2020       Lab Results   Component Value Date    BILITOT 0.2 (L) 10/05/2021    PROT 6.7 10/05/2021    ALBUMIN 3.1 (L) 10/05/2021    ALT 46 10/05/2021    AST 50 (H) 10/05/2021    ALKPHOS 116 10/05/2021       No results found for: PT, INR, APTT      IMAGING  PET/CT (09/29/21)   Diffuse background activity, most likely due to insufficient fasting prior to scanning limits evaluation.      -Numerous bilateral pulmonary nodules with apparent increase in size and probably number with the largest in the right middle lobe measuring 1.8 cm, worrisome for metastatic disease.      -Subcentimeter mildly FDG avid bilateral neck lymph nodes, concerning but not definitive for residual disease. Recommend ultrasound and FNA, as clinically indicated.      -Findings in the right inguinal region as described under Adenopathy, most compatible with improving infection. Recommend focused physical examination.      -Cystic structure with a rim of FDG uptake in the left posterolateral scalp, grossly unchanged. Felt to be benign. Recommend focused physical exam.      -Recommend radioiodine scan to evaluate for inter-lesion heterogeneity and potentially radioiodine avid tissue.         PATHOLOGY  THYROID GLAND  8th Edition - Protocol posted: 03/27/2018  THYROID GLAND: RESECTION - All Specimens  Clinical History  No known radiation exposure    SPECIMEN   Procedure  Total thyroidectomy    TUMOR   Tumor Focality  Multifocal    Tumor Characteristics     Tumor Site  Right lobe  Left lobe      Isthmus    Histologic Type  Papillary carcinoma, classic (usual, conventional)    Tumor Size  Greatest Dimension (Centimeters): 3.1 cm   Extrathyroidal Extension  Not identified    Angioinvasion (vascular invasion)  Not identified    Lymphatic Invasion  Not identified    Perineural Invasion  Not identified    Margins  Uninvolved by carcinoma    Distance of Invasive Carcinoma from Closest Margin (Millimeters)  1 mm   Mitotic Rate  4 Mitoses per 2 mm^2   LYMPH NODES   Number of Lymph Nodes Involved  14    Nodal Levels Involved  Level VI    Size of Largest Metastatic Deposit (Centimeters)  5 cm   Extranodal Extension (ENE)  Present    Number of Lymph Nodes Examined  14    Nodal Levels Examined  Level VI    PATHOLOGIC STAGE CLASSIFICATION (pTNM, AJCC 8th Edition)   TNM Descriptors  m (multiple primary tumors)         Primary Tumor (pT)  pT2    Regional Lymph Nodes (pN) pN1a    Distant Metastasis (pM)  pM1    Site(s)  lung see [id number]      Scribe's Attestation: Ginette Otto, DO obtained and performed the history, physical exam and medical decision making elements that were entered into the chart. Signed by Butch Penny, Scribe, on October 05, 2021 at 2:19 PM     ----------------------------------------------------------------------------------------------------------------------  Documentation assistance provided by the Scribe. I was present during the time the encounter was recorded. The information recorded by the Scribe was done at my direction and has been reviewed and validated by me.  ----------------------------------------------------------------------------------------------------------------------

## 2021-10-05 NOTE — Unmapped (Unsigned)
Labs drawn via butterfly & sent for analysis.  To next appt.  Care provided by Alicia S RN.

## 2021-10-06 NOTE — Unmapped (Signed)
[facility]   Patient Onboarding/Medication Counseling    Paul Hines is a 51 y.o. male with thyroid cancer who I am counseling today on continuation of therapy.  I am speaking to the patient.    Was a Nurse, learning disability used for this call? No    Verified patient's date of birth / HIPAA.    Specialty medication(s) to be sent: Hematology/Oncology: [name]      Non-specialty medications/supplies to be sent: strips, lancets,atorvastatin,gabapentin,hydrochlorothiazide, insulin, levothyroxine, losartan, metformin, pen needles      Medications not needed at this time: n/a         Lenvima (Lenvatinib)    The patient declined counseling on missed dose instructions, goals of therapy, side effects and monitoring parameters, warnings and precautions, drug/food interactions and storage, handling precautions, and disposal because they have taken the medication previously. The information in the declined sections below are for informational purposes only and was not discussed with patient.       Medication & Administration     Dosage:  18mg  daily (one 10mg  capsule and two 4mg  capsule)  Comes in blister card packaging.    Administration:   ??? Comes in a blister card packing. It is important to take exact dose as prescribed.    ??? Administer orally at the same time each day.   ??? May be administered with or without food.  ??? Capsules may be swallowed whole or dissolved in a small glass of liquid. To dissolve in liquid, measure 15 mL of water or apple juice into a glass; add whole capsule (do not break or crush capsule) and leave in liquid for at least 10 minutes, then stir for at least 3 minutes. Administer liquid, then add 15 mL of additional water or apple juice to glass, swirl a few times and then swallow additional liquid.    Adherence/Missed dose instructions: If you miss a dose, take it as soon as you remember.  If the next dose is scheduled within 12 hours, skip the missed dose and take the next dose at its next regularly scheduled time. Do not take an extra dose to make up for a missed dose.    Goals of Therapy     Prevent disease progression    Side Effects & Monitoring Parameters     ??? Nausea/Vomiting  ??? Diarrhea  ??? Fatigue  ??? Cough   ??? High Blood Pressure  ??? Hand Foot Syndrome  ??? Increase bleeding risk  ??? Decreased appetite and changes in taste  ??? Dry mouth, mouth irritation, mouth pain, mouth sores  ??? Joint/muscle aches  ??? Restlessness, difficulty sleeping  ??? Impaired wound healing  ??? Heartburn  ??? Hair loss   ??? Headache  ??? Protein in the urine  ??? Voice changes     The following side effects should be reported to the provider:  ??? Heartbeat that doesn't feel normal (heart feels like it's racing, skipping a beat or fluttering)  ??? Hypertension  ??? Dark urine or yellow skin or eyes  ??? Signs of infection (fever, chills, cough)  ??? Excessive bruising, gums bleeding or nose bleeds  ??? Mouth irritation or mouth sores  ??? Redness or irritation on the palms of the hands or soles of the feet  ??? Swelling, warmth, numbness, change in color or pain in a leg or arm  ??? Signs of low thyroid (constipation, trouble handling heat or cold, memory problems, mood changes, or burning, numbness or tingling feeling).  ??? Severe  or persistent diarrhea  ??? Signs of posterior reversible encephalopathy syndrome (confusion, not alert, vision changes, seizures, or severe headache)  ??? Signs of cerebrovascular disease (change in strength on one side is greater than the other, trouble speaking or thinking, change in balance or vision changes)  ??? Signs of fluid and electrolyte problems (mood changes, confusion, muscle pain or weakness, abnormal/fast heartbeat, severe dizziness, passing out)  ??? Signs of kidney problems (inability to pass urine, blood in the urine, change in amount of urine passed, weight gain)  ??? Signs of UTI (blood in the urine, burning or painful urination, passing a lot of urine, fever, lower abdominal or pelvic pain)  ??? Signs of anaphylaxis (wheezing, chest tightness, swelling of face, lips, tongue or throat)    Monitoring Parameters:  ??? Monitor for Qtc prolongation  ??? Blood pressure  ??? Liver function tests at baseline and during treatment  ??? Renal function  ??? Electrolytes  ??? Serum calcium  ??? TSH  ??? Thyroid function at baseline and monthly  ??? Proteinuria baseline and during treatment  ??? Pregnancy test in females of reproductive potential      Contraindications, Warnings, & Precautions     ??? Hand-foot syndrome- counseled patient to use moisturizer on hands/feet, use lukewarm water especially when washing hands and pat them dry instead of rubbing them   ??? Mouth sores- discussed use of baking soda/salt water rinses   ??? Cardiac effects : hypertension  ??? GI perforation  ??? Hemorrhage  ??? Hepatotoxicity  ??? Hypocalcemia  ??? Hypothyroidism  ??? Reversible posterior leukocencephalopathy syndrome  ??? Thromboembolic events  ??? Wound healing impairment  ??? Exposure of an unborn child to this medication could cause birth defects, so you should not become pregnant or father a child while on this medication.  Effective birth control is necessary during treatment and for at least 30 days after treatment.    Drug/Food Interactions     ??? Medication list reviewed in Epic. The patient was instructed to inform the care team before taking any new medications or supplements including over the counter medications, vitamins, and herbal supplements. No drug interactions identified.     Storage, Handling Precautions, & Disposal     ??? This medication should be stored at room temperature and in a dry location. Keep out of reach of others including children and pets. Keep the medicine in the original blister packs (no pillboxes).   ??? Do not throw away or flush unused mediation down the toilet or sink. This drug is considered hazardous and should be handled as little as possible.  If someone else helps with medication administration, they should wear gloves      Current Medications (including OTC/herbals), Comorbidities and Allergies     Current Outpatient Medications   Medication Sig Dispense Refill   ??? atorvastatin (LIPITOR) 80 MG tablet Take 1 tablet (80 mg total) by mouth nightly. 90 tablet 2   ??? blood sugar diagnostic (ACCU-CHEK GUIDE TEST STRIPS) Strp Use to check blood glucose once daily. 100 strip 1   ??? blood-glucose meter kit Test blood glucose level once daily. 1 each 0   ??? gabapentin (NEURONTIN) 300 MG capsule Take 1 capsule (300 mg total) by mouth two (2) times a day. 180 capsule 2   ??? hydroCHLOROthiazide (HYDRODIURIL) 12.5 MG tablet Take 1 tablet (12.5 mg total) by mouth daily. 90 tablet 1   ??? insulin glargine (BASAGLAR, LANTUS) 100 unit/mL (3 mL) injection pen Inject 0.1 mL (10 Units total)  under the skin nightly. Discard remainder of pen 28 days after initial use. 15 mL 0   ??? lancets Misc Use to check blood glucose once daily. 100 each 1   ??? lenvatinib 18 mg/day (10 mg x 1 AND 4 mg x 2) cap Take 3 capsules (18 mg) by mouth daily. 90 capsule 2   ??? levothyroxine (SYNTHROID) 200 MCG tablet Take 1 tablet (200 mcg total) by mouth daily. 90 tablet 1   ??? losartan (COZAAR) 50 MG tablet Take 1 tablet (50 mg total) by mouth daily. 90 tablet 2   ??? metFORMIN (GLUCOPHAGE) 500 MG tablet Take 2 tablets (1,000 mg total) by mouth in the morning and 2 tablets (1,000 mg total) in the evening. Take with meals. 360 tablet 2   ??? pen needle, diabetic 32 gauge x 5/32 (4 mm) Ndle Use with insulin glargine once daily 100 each 1     No current facility-administered medications for this visit.       Allergies   Allergen Reactions   ??? Fish Derived    ??? Lisinopril    ??? Peanut Butter Flavor      Per allergy testing   ??? Penicillins Other (See Comments)     Per patient, when he took at age 1 his legs and arms shook. Not sure if it was seizure or not.    ??? Shellfish Containing Products    ??? Soy        Patient Active Problem List   Diagnosis   ??? Abnormal computed tomography angiography (CTA)   ??? Thyroid cancer (CMS-HCC)   ??? Obesity   ??? Right inguinal hernia   ??? Diabetes (CMS-HCC)   ??? Essential hypertension       Reviewed and up to date in Epic.    Appropriateness of Therapy     Acute infections noted within Epic:  No active infections  Patient reported infection: None    Is medication and dose appropriate based on diagnosis and infection status? Yes    Prescription has been clinically reviewed: Yes      Baseline Quality of Life Assessment      How many days over the past month did your condition/medication  keep you from your normal activities? For example, brushing your teeth or getting up in the morning. 0    Financial Information     Medication Assistance provided: None Required    Anticipated copay of $4 reviewed with patient. Verified delivery address.    Delivery Information     Scheduled delivery date: 10/11/21    Expected start date: 10/11/21    Medication will be delivered via Same Day Courier to the prescription address in [facility].  This shipment will not require a signature.      Explained the services we provide at [facility] and that each month we would call to set up refills.  Stressed importance of returning phone calls so that we could ensure they receive their medications in time each month.  Informed patient that we should be setting up refills 7-10 days prior to when they will run out of medication.  A pharmacist will reach out to perform a clinical assessment periodically.  Informed patient that a welcome packet, containing information about our pharmacy and other support services, a Notice of Privacy Practices, and a drug information handout will be sent.      The patient or caregiver noted above participated in the development of this care plan and  knows that they can request review of or adjustments to the care plan at any time.      Patient or caregiver verbalized understanding of the above information as well as how to contact the pharmacy at [phone] option 4 with any questions/concerns.  The pharmacy is open Monday through Friday 8:30am-4:30pm.  A pharmacist is available 24/7 via pager to answer any clinical questions they may have.    Patient Specific Needs     - Does the patient have any physical, cognitive, or cultural barriers? No    - Does the patient have adequate living arrangements? (i.e. the ability to store and take their medication appropriately) Yes    - Did you identify any home environmental safety or security hazards? No    - Patient prefers to have medications discussed with  Patient     - Is the patient or caregiver able to read and understand education materials at a high school level or above? Yes    - Patient's primary language is  English     - Is the patient high risk? Yes, patient is taking oral chemotherapy. Appropriateness of therapy as been assessed    SOCIAL DETERMINANTS OF HEALTH     At the [facility], we have learned that life circumstances - like trouble affording food, housing, utilities, or transportation can affect the health of many of our patients.   That is why we wanted to ask: are you currently experiencing any life circumstances that are negatively impacting your health and/or quality of life? No    Social Determinants of Health     Financial Resource Strain: Low Risk  (07/29/2020)    Overall Financial Resource Strain (CARDIA)    ??? Difficulty of Paying Living Expenses: Not hard at all   Internet Connectivity: Not on file   Food Insecurity: No Food Insecurity (07/29/2020)    Hunger Vital Sign    ??? Worried About Running Out of Food in the Last Year: Never true    ??? Ran Out of Food in the Last Year: Never true   Tobacco Use: Medium Risk (06/27/2021)    Patient History    ??? Smoking Tobacco Use: Former    ??? Smokeless Tobacco Use: Never    ??? Passive Exposure: Not on file   Housing/Utilities: Low Risk  (07/29/2020)    Housing/Utilities    ??? Within the past 12 months, have you ever stayed: outside, in a car, in a tent, in an overnight shelter, or temporarily in someone else's home (i.e. couch-surfing)?: No    ??? Are you worried about losing your housing?: No    ??? Within the past 12 months, have you been unable to get utilities (heat, electricity) when it was really needed?: No   Alcohol Use: Not on file   Transportation Needs: No Transportation Needs (07/29/2020)    PRAPARE - Transportation    ??? Lack of Transportation (Medical): No    ??? Lack of Transportation (Non-Medical): No   Substance Use: Not on file   Health Literacy: Not on file   Physical Activity: Not on file   Interpersonal Safety: Not on file   Stress: Not on file   Intimate Partner Violence: Not on file   Depression: Not on file   Social Connections: Not on file       Would you be willing to receive help with any of the needs that you have identified today? No       Paul Hines  [facility] Specialty Pharmacist

## 2021-10-07 NOTE — Unmapped (Signed)
Pt called back 10/07/21 to change delivery date to 10/13/21 NRS

## 2021-10-13 MED FILL — LENVIMA 18 MG/DAY (10 MG X 1 AND 4 MG X 2) CAPSULE: ORAL | 30 days supply | Qty: 90 | Fill #0

## 2021-10-31 DIAGNOSIS — C73 Malignant neoplasm of thyroid gland: Principal | ICD-10-CM

## 2021-11-02 ENCOUNTER — Ambulatory Visit: Admit: 2021-11-02 | Payer: PRIVATE HEALTH INSURANCE | Attending: Oncology | Primary: Oncology

## 2021-11-02 ENCOUNTER — Ambulatory Visit: Admit: 2021-11-02 | Payer: PRIVATE HEALTH INSURANCE

## 2021-11-02 ENCOUNTER — Ambulatory Visit
Admit: 2021-11-02 | Payer: PRIVATE HEALTH INSURANCE | Attending: Student in an Organized Health Care Education/Training Program | Primary: Student in an Organized Health Care Education/Training Program

## 2021-11-04 NOTE — Unmapped (Signed)
[facility] Clinical Assessment & Refill Coordination Note    Paul Hines, DOB: 1971-01-08  Phone: There are no phone numbers on file.    All above HIPAA information was verified with patient.     Was a Nurse, learning disability used for this call? No    Specialty Medication(s):   Hematology/Oncology: Paul Hines     Current Outpatient Medications   Medication Sig Dispense Refill   ??? atorvastatin (LIPITOR) 80 MG tablet Take 1 tablet (80 mg total) by mouth nightly. 90 tablet 2   ??? blood sugar diagnostic (ACCU-CHEK GUIDE TEST STRIPS) Strp Use to check blood glucose once daily. 100 strip 1   ??? blood-glucose meter kit Test blood glucose level once daily. 1 each 0   ??? gabapentin (NEURONTIN) 300 MG capsule Take 1 capsule (300 mg total) by mouth two (2) times a day. 180 capsule 2   ??? hydroCHLOROthiazide (HYDRODIURIL) 12.5 MG tablet Take 1 tablet (12.5 mg total) by mouth daily. 90 tablet 1   ??? insulin glargine (BASAGLAR, LANTUS) 100 unit/mL (3 mL) injection pen Inject 0.1 mL (10 Units total) under the skin nightly. Discard remainder of pen 28 days after initial use. 15 mL 0   ??? lancets Misc Use to check blood glucose once daily. 100 each 1   ??? lenvatinib 18 mg/day (10 mg x 1 AND 4 mg x 2) cap Take 3 capsules (18 mg) by mouth daily. 90 capsule 2   ??? levothyroxine (SYNTHROID) 200 MCG tablet Take 1 tablet (200 mcg total) by mouth daily. 90 tablet 1   ??? losartan (COZAAR) 50 MG tablet Take 1 tablet (50 mg total) by mouth daily. 90 tablet 2   ??? metFORMIN (GLUCOPHAGE) 500 MG tablet Take 2 tablets (1,000 mg total) by mouth in the morning and 2 tablets (1,000 mg total) in the evening. Take with meals. 360 tablet 2   ??? pen needle, diabetic 32 gauge x 5/32 (4 mm) Ndle Use with insulin glargine once daily 100 each 1     No current facility-administered medications for this visit.        Changes to medications: Paul Hines reports no changes at this time.    Allergies   Allergen Reactions   ??? Fish Derived    ??? Lisinopril    ??? Peanut Butter Flavor      Per allergy testing   ??? Penicillins Other (See Comments)     Per patient, when he took at age 59 his legs and arms shook. Not sure if it was seizure or not.    ??? Shellfish Containing Products    ??? Soy        Changes to allergies: No    SPECIALTY MEDICATION ADHERENCE     Lenvima 18 mg: 7 days of medicine on hand     Medication Adherence    Patient reported X missed doses in the last month: 0  Specialty Medication: lenvima                      Specialty medication(s) dose(s) confirmed: Regimen is correct and unchanged.     Are there any concerns with adherence? No    Adherence counseling provided? Not needed    CLINICAL MANAGEMENT AND INTERVENTION      Clinical Benefit Assessment:    Do you feel the medicine is effective or helping your condition? Yes    Clinical Benefit counseling provided? Not needed    Adverse Effects Assessment:  Are you experiencing any side effects? No    Are you experiencing difficulty administering your medicine? No    Quality of Life Assessment:    Quality of Life      Oncology  1. What impact has your specialty medication had on the reduction of your daily pain or discomfort level?: None  2. On a scale of 1-10, how would you rate your ability to manage side effects associated with your specialty medication? (1=no issues, 10 = unable to take medication due to side effects): 1            How many days over the past month did your condition/medication  keep you from your normal activities? For example, brushing your teeth or getting up in the morning. 0    Have you discussed this with your provider? Not needed    Acute Infection Status:    Acute infections noted within Epic:  No active infections  Patient reported infection: None    Therapy Appropriateness:    Is therapy appropriate and patient progressing towards therapeutic goals? Yes, therapy is appropriate and should be continued    DISEASE/MEDICATION-SPECIFIC INFORMATION      N/A    Oncology: Is the patient receiving adequate infection prevention treatment? Not applicable  Does the patient have adequate nutritional support? yes    PATIENT SPECIFIC NEEDS     - Does the patient have any physical, cognitive, or cultural barriers? No    - Is the patient high risk? Yes, patient is taking oral chemotherapy. Appropriateness of therapy as been assessed    - Did the patient require a clinical intervention? No    - Does the patient require physician intervention or other additional services (i.e., nutrition, smoking cessation, social work)? No    SOCIAL DETERMINANTS OF HEALTH     At the [facility], we have learned that life circumstances - like trouble affording food, housing, utilities, or transportation can affect the health of many of our patients.   That is why we wanted to ask: are you currently experiencing any life circumstances that are negatively impacting your health and/or quality of life? No    Social Determinants of Health     Financial Resource Strain: Low Risk  (07/29/2020)    Overall Financial Resource Strain (CARDIA)    ??? Difficulty of Paying Living Expenses: Not hard at all   Internet Connectivity: Not on file   Food Insecurity: No Food Insecurity (07/29/2020)    Hunger Vital Sign    ??? Worried About Running Out of Food in the Last Year: Never true    ??? Ran Out of Food in the Last Year: Never true   Tobacco Use: Medium Risk (06/27/2021)    Patient History    ??? Smoking Tobacco Use: Former    ??? Smokeless Tobacco Use: Never    ??? Passive Exposure: Not on file   Housing/Utilities: Low Risk  (07/29/2020)    Housing/Utilities    ??? Within the past 12 months, have you ever stayed: outside, in a car, in a tent, in an overnight shelter, or temporarily in someone else's home (i.e. couch-surfing)?: No    ??? Are you worried about losing your housing?: No    ??? Within the past 12 months, have you been unable to get utilities (heat, electricity) when it was really needed?: No   Alcohol Use: Not on file Transportation Needs: No Transportation Needs (07/29/2020)    PRAPARE - Transportation    ??? Lack  of Transportation (Medical): No    ??? Lack of Transportation (Non-Medical): No   Substance Use: Not on file   Health Literacy: Not on file   Physical Activity: Not on file   Interpersonal Safety: Not on file   Stress: Not on file   Intimate Partner Violence: Not on file   Depression: Not on file   Social Connections: Not on file       Would you be willing to receive help with any of the needs that you have identified today? No       SHIPPING     Specialty Medication(s) to be Shipped:   Hematology/Oncology: Paul Hines    Other medication(s) to be shipped: No additional medications requested for fill at this time     Changes to insurance: No    Delivery Scheduled: Yes, Expected medication delivery date: 11/09/21.     Medication will be delivered via Next Day Courier to the confirmed prescription address in [facility]    The patient will receive a drug information handout for each medication shipped and additional FDA Medication Guides as required.  Verified that patient has previously received a Conservation officer, historic buildings and a Surveyor, mining.    The patient or caregiver noted above participated in the development of this care plan and knows that they can request review of or adjustments to the care plan at any time.      All of the patient's questions and concerns have been addressed.    Rollen Sox, [facility]   [facility] Specialty Pharmacist

## 2021-11-07 ENCOUNTER — Ambulatory Visit
Admit: 2021-11-07 | Discharge: 2021-11-07 | Disposition: A | Discharge status: Home / Self Care | Payer: PRIVATE HEALTH INSURANCE | Attending: Student in an Organized Health Care Education/Training Program

## 2021-11-07 DIAGNOSIS — L732 Hidradenitis suppurativa: Principal | ICD-10-CM

## 2021-11-07 MED ORDER — CLINDAMYCIN 1 % TOPICAL GEL, ONCE DAILY
Freq: Every day | TOPICAL | 0 refills | 0 days | Status: CP
Start: 2021-11-07 — End: ?

## 2021-11-07 NOTE — Unmapped (Signed)
Patient here with multiple boils.

## 2021-11-07 NOTE — Unmapped (Signed)
Patient reports having boils that keep popping near his buttock region for 2 months.     Hypertensive in triage but just finished smoking cigarette

## 2021-11-07 NOTE — Unmapped (Signed)
[facility] Emergency Department Provider Note         ED Clinical Impression     Final diagnoses:   Hidradenitis suppurativa (Primary)       Presenting History and MDM     HPI    November 07, 2021 11:42 AM   Paul Hines is a 51 y.o. male who  has a past medical history of Abnormal findings on dx imaging of heart and cor circ, Acne vulgaris, Allergic contact dermatitis due to food in contact with skin, Atherosclerosis, Athscl heart disease of native coronary artery w/o ang pctrs, Chronic ischemic heart disease, Cutaneous abscess, Diabetic polyneuropathy (CMS-HCC), Disorder of skin and subcutaneous tissue, Essential hypertension, H/O medication noncompliance, Hernia, inguinal, unilateral, Hypertrophic cardiomyopathy (CMS-HCC), Malignant neoplasm of thyroid gland (CMS-HCC), Presbyopia, Pseudofolliculitis barbae, Shoulder pain, bilateral, Tinea unguium, Type 2 diabetes mellitus (CMS-HCC), and Xerosis cutis. presenting with boils near his buttock region for 2 months that intermittently form and pop. He notes drainage from the boils currently. Denies diarrhea, fevers, or emesis.  He does not report any abdominal pain.  Not reporting any groin pains or swelling.  Is not reporting testicular pain or swelling.  No reported blood in the stool.  Denies any nausea, vomiting, diarrhea.      Initial impression and pertinent physical exam:      BP 181/122 Comment: checked twice both arms - Pulse 88  - Temp 36.6 ??C (97.9 ??F) (Oral)  - Resp 20  - Wt 80.7 kg (178 lb)  - SpO2 97%  - BMI 27.88 kg/m??     On initial evaluation is overall well.  No acute distress.  Patient has multiple lesions in the gluteal cleft consistent with Hidradenitis suppurativa, 1 of which is actively draining.  No evidence of palpable masses, fluctuance, or induration.     Diagnostic workup as below.     No orders of the defined types were placed in this encounter.      Will reassess as we get results and update below           MDM: Given patient's history, presentation, and physical exam, this patient presents with draining fluid pockets in the gluteal cleft bilaterally consistent withHidradenitis suppurativa. There is no area of retained pus no palpable masses, fluctuance, or induration. The presentation of patient is NOT consistent with Fournier's gangrene. There is no evidence of neurovascular or tendon injury. This patient presentation is NOT consistent with sepsis and/or bacturemia. There is no lymphangitic spread visible. Patient is not immunocompromised, and there is no bullae, pain out of proportion, or rapid progression concerning for necrotizing fasciitis. Patient meets outpatient criteria for treatment and patient to be discharged home with topical clindamycin with follow up with their PMD.       Discussion of Management with other Physicians, QHP, or Appropriate Source: None  External Records Reviewed: 10/05/21 [facility] Surgery Oncology Office Visit for past medical history  Escalation of Care, Consideration of Admission/Observation/Transfer: Admission not required. Appropriate for outpatient management.  Social determinants that significantly affected care: None  Prescription drug(s) considered but not prescribed: Consider prescribing 600 mg ibuprofen Q6 for pain.  However after shared decision making did not prescribe.  Patient will take home meds.  Diagnostic tests considered but not performed: Patient's physical exam with no evidence of acute abdomen.  No peritoneal signs.  Overall I have low suspicion for any emergent intra-abdominal pathology.  CT scan of the abdomen pelvis not indicated at this time.  _____________________________________________________________________    The case was discussed with attending physician who is in agreement with the above assessment and plan    Additional Medical Decision Making     I have reviewed the vital signs and the nursing notes. Labs and radiology results that were available during my care of the patient were independently reviewed by me and considered in my medical decision making.   I independently visualized the EKG tracing if performed  I independently visualized the radiology images if performed  I reviewed the patient's prior medical records if available.  Additional history obtained from family if available    Other History     CHIEF COMPLAINT:   Chief Complaint   Patient presents with    Boil       PAST MEDICAL HISTORY/PAST SURGICAL HISTORY:   Past Medical History:   Diagnosis Date    Abnormal findings on dx imaging of heart and cor circ     Acne vulgaris     Allergic contact dermatitis due to food in contact with skin     Atherosclerosis     Athscl heart disease of native coronary artery w/o ang pctrs     Chronic ischemic heart disease     Cutaneous abscess     Diabetic polyneuropathy (CMS-HCC)     Disorder of skin and subcutaneous tissue     Essential hypertension     H/O medication noncompliance     Hernia, inguinal, unilateral     Hypertrophic cardiomyopathy (CMS-HCC)     Malignant neoplasm of thyroid gland (CMS-HCC)     Presbyopia     Pseudofolliculitis barbae     Shoulder pain, bilateral     Tinea unguium     Type 2 diabetes mellitus (CMS-HCC)     Xerosis cutis        Past Surgical History:   Procedure Laterality Date    PR BRNCHSC EBUS GUIDED SAMPL 1/2 NODE STATION/STRUX N/A 01/09/2020    Procedure: BRONCH, RIGID OR FLEXIBLE, INC FLUORO GUIDANCE, WHEN PERFORMED; WITH EBUS GUIDED TRANSTRACHEAL AND/OR TRANSBRONCHIAL SAMPLING, ONE OR TWO MEDIASTINAL AND/OR HILAR LYMPH NODE STATIONS OR STRUCTURES;  Surgeon: Jerelyn Charles, MD;  Location: MAIN OR [place];  Service: Pulmonary    PR BRNSCHSC TNDSC EBUS DX/TX INTERVENTION PERPH LES Right 01/09/2020    Procedure: BRONCH, RIGID OR FLEXIBLE, INCLUDING FLUORO GUIDANCE, WHEN PERFORMED; WITH TRANSENDOSCOPIC EBUS DURING BRONCHOSCOPIC DIAGNOSTIC OR THERAPEUTIC INTERVENTION(S) FOR PERIPHERAL LESION(S);  Surgeon: Jerelyn Charles, MD;  Location: MAIN OR [place];  Service: Pulmonary    PR BRONCHOSCOPY,COMPUTER ASSIST/IMAGE-GUIDED NAVIGATION Right 01/09/2020    Procedure: ROBOT ION BRONCHOSCOPY,RIGID OR FLEXIBLE,INCLUDE FLUORO WHEN PERFORMED; W/COMPUTER-ASSIST,IMAGE-GUIDED NAVIGATION;  Surgeon: Jerelyn Charles, MD;  Location: MAIN OR [place];  Service: Pulmonary    PR BRONCHOSCOPY,DIAGNOSTIC W LAVAGE Right 01/09/2020    Procedure: BRONCHOSCOPY, RIGID OR FLEXIBLE, INCLUDE FLUOROSCOPIC GUIDANCE WHEN PERFORMED; W/BRONCHIAL ALVEOLAR LAVAGE;  Surgeon: Jerelyn Charles, MD;  Location: MAIN OR [place];  Service: Pulmonary    PR BRONCHOSCOPY,TRANSBRON ASPIR BX Right 01/09/2020    Procedure: BRONCHOSCOPY, RIGID/FLEX, INCL FLUORO; W/TRANSBRONCH NDL ASPIRAT BX, TRACHEA, MAIN STEM &/OR LOBAR BRONCHUS;  Surgeon: Jerelyn Charles, MD;  Location: MAIN OR [place];  Service: Pulmonary    PR BRONCHOSCOPY,TRANSBRONCH BIOPSY Right 01/09/2020    Procedure: BRONCHOSCOPY, RIGID/FLEXIBLE, INCLUDE FLUORO GUIDANCE WHEN PERFORMED; W/TRANSBRONCHIAL LUNG BX, SINGLE LOBE;  Surgeon: Jerelyn Charles, MD;  Location: MAIN OR [place];  Service: Pulmonary    PR CATH PLACE/CORON ANGIO, IMG SUPER/INTERP,W LEFT HEART VENTRICULOGRAPHY N/A  08/26/2019    Procedure: Left Heart Catheterization;  Surgeon: Marlaine Hind, MD;  Location: [facility] CATH;  Service: Cardiology    PR LIGATN THOR DUCT,CERV APPROACH Bilateral 07/28/2020    Procedure: SUTURE &/OR LIG THORACIC DUCT; CERV APPROACH;  Surgeon: Lauralee Evener, MD;  Location: MAIN OR [facility];  Service: ENT    PR REMOVAL NODES, NECK,CERV MOD RAD Bilateral 07/28/2020    Procedure: CERVICAL LYMPHADENECTOMY (MODIFIED RADICAL NECK DISSECTION);  Surgeon: Lauralee Evener, MD;  Location: MAIN OR [facility];  Service: ENT    PR THYROIDECTOMY,MALIG,LTD NECK SURG Bilateral 04/12/2020    Procedure: THYROIDECTOMY, TOTAL OR SUBTOTAL FOR MALIGNANCY; WITH LIMITED NECK DISSECTION;  Surgeon: Lauralee Evener, MD;  Location: MAIN OR [facility];  Service: ENT MEDICATIONS:   No current facility-administered medications for this encounter.    Current Outpatient Medications:     atorvastatin (LIPITOR) 80 MG tablet, Take 1 tablet (80 mg total) by mouth nightly., Disp: 90 tablet, Rfl: 2    blood sugar diagnostic (ACCU-CHEK GUIDE TEST STRIPS) Strp, Use to check blood glucose once daily., Disp: 100 strip, Rfl: 1    blood-glucose meter kit, Test blood glucose level once daily., Disp: 1 each, Rfl: 0    clindamycin phosphate 1 % glqd, Apply 1 Dose topically daily., Disp: 50 mL, Rfl: 0    gabapentin (NEURONTIN) 300 MG capsule, Take 1 capsule (300 mg total) by mouth two (2) times a day., Disp: 180 capsule, Rfl: 2    hydroCHLOROthiazide (HYDRODIURIL) 12.5 MG tablet, Take 1 tablet (12.5 mg total) by mouth daily., Disp: 90 tablet, Rfl: 1    insulin glargine (BASAGLAR, LANTUS) 100 unit/mL (3 mL) injection pen, Inject 0.1 mL (10 Units total) under the skin nightly. Discard remainder of pen 28 days after initial use., Disp: 15 mL, Rfl: 0    lancets Misc, Use to check blood glucose once daily., Disp: 100 each, Rfl: 1    lenvatinib 18 mg/day (10 mg x 1 AND 4 mg x 2) cap, Take 3 capsules (18 mg) by mouth daily., Disp: 90 capsule, Rfl: 2    levothyroxine (SYNTHROID) 200 MCG tablet, Take 1 tablet (200 mcg total) by mouth daily., Disp: 90 tablet, Rfl: 1    losartan (COZAAR) 50 MG tablet, Take 1 tablet (50 mg total) by mouth daily., Disp: 90 tablet, Rfl: 2    metFORMIN (GLUCOPHAGE) 500 MG tablet, Take 2 tablets (1,000 mg total) by mouth in the morning and 2 tablets (1,000 mg total) in the evening. Take with meals., Disp: 360 tablet, Rfl: 2    pen needle, diabetic 32 gauge x 5/32 (4 mm) Ndle, Use with insulin glargine once daily, Disp: 100 each, Rfl: 1    ALLERGIES:   Fish derived, Lisinopril, Peanut butter flavor, Penicillins, Shellfish containing products, and Soy    SOCIAL HISTORY:   Social History     Tobacco Use    Smoking status: Former     Types: Cigarettes     Quit date: 2018 Years since quitting: 5.7    Smokeless tobacco: Never   Substance Use Topics    Alcohol use: Not on file       FAMILY HISTORY:  Family History   Family history unknown: Yes          Review of Systems    A 10 point review of systems was performed and is negative other than positive elements noted in HPI     Physical Exam     Constitutional: Alert and oriented. No  acute distress.  Eyes: Conjunctivae are normal.  HENT: Normocephalic and atraumatic. No congestion. Moist mucous membranes.   Cardiovascular: Rate as above, regular rhythm. Normal and symmetric distal pulses. Brisk capillary refill. Normal skin turgor.  Respiratory: Normal respiratory effort. Breath sounds are normal. There are no wheezing or crackles heard.  Gastrointestinal: Patient has multiple lesions in the gluteal cleft consistent with Hidradenitis suppurativa, 1 of which is actively draining.  No evidence of palpable masses, fluctuance, or induration.   Genitourinary: Deferred.  Musculoskeletal: Non-tender with normal range of motion in all extremities.  Neurologic: Normal speech and language. No gross focal neurologic deficits are appreciated. Patient is moving all extremities equally, face is symmetric at rest and with speech.  Skin: Skin is warm, dry and intact. No rash noted.  Psychiatric: Mood and affect are normal. Speech and behavior are normal.    Radiology     No orders to display       Labs     Labs Reviewed - No data to display    Please note- This chart has been created using [employer]. Chart creation errors have been sought, but may not always be located and such creation errors, especially pronoun confusion, do NOT reflect on the standard of medical care.    Documentation assistance was provided by Orion Crook, Scribe, on November 07, 2021 at 11:42 AM for Aquilla Solian, MD     I attest that I have reviewed the scribe note and that the components of the history of the present illness, the physical exam, and the assessment and plan documented were performed by me. I verified the documentation and performed the exam and medical decision making.     Aquilla Solian, MD  Emergency Medicine, PGY-2         Aquilla Solian, MD  Resident  11/07/21 [phone]

## 2021-11-08 MED FILL — LENVIMA 18 MG/DAY (10 MG X 1 AND 4 MG X 2) CAPSULE: ORAL | 30 days supply | Qty: 90 | Fill #1

## 2021-11-11 NOTE — Unmapped (Signed)
Patient [name] Cancer Support Encounter    Paul Hines called for the number to set up his Medicaid ride for his upcoming appointment on 10/18. I gave him the number for [facility] transportation. Paul Hines plans to stop into the [name] after her appointments on 10/18.      Length of Encounter: 10 min    [name], [name], RN, [facility]   She/Her/Hers  Oncology Nurse Navigator - [name] [facility]  [facility]  [address] [place], [place] [address]    Center: [phone]  Voicemail: [phone]  [email or web address]

## 2021-11-15 DIAGNOSIS — L732 Hidradenitis suppurativa: Principal | ICD-10-CM

## 2021-11-16 ENCOUNTER — Other Ambulatory Visit: Admit: 2021-11-16 | Discharge: 2021-11-16 | Payer: PRIVATE HEALTH INSURANCE

## 2021-11-16 ENCOUNTER — Ambulatory Visit
Admit: 2021-11-16 | Discharge: 2021-11-16 | Payer: PRIVATE HEALTH INSURANCE | Attending: Student in an Organized Health Care Education/Training Program | Primary: Student in an Organized Health Care Education/Training Program

## 2021-11-16 ENCOUNTER — Ambulatory Visit: Admit: 2021-11-16 | Discharge: 2021-11-16 | Payer: PRIVATE HEALTH INSURANCE | Attending: Oncology | Primary: Oncology

## 2021-11-16 DIAGNOSIS — L732 Hidradenitis suppurativa: Principal | ICD-10-CM

## 2021-11-16 DIAGNOSIS — C73 Malignant neoplasm of thyroid gland: Principal | ICD-10-CM

## 2021-11-16 LAB — COMPREHENSIVE METABOLIC PANEL
ALBUMIN: 3.1 g/dL — ABNORMAL LOW (ref 3.4–5.0)
ALKALINE PHOSPHATASE: 107 U/L (ref 46–116)
ALT (SGPT): 23 U/L (ref 10–49)
ANION GAP: 6 mmol/L (ref 5–14)
AST (SGOT): 26 U/L (ref <=34)
BILIRUBIN TOTAL: 0.5 mg/dL (ref 0.3–1.2)
BLOOD UREA NITROGEN: 10 mg/dL (ref 9–23)
BUN / CREAT RATIO: 10
CALCIUM: 9.1 mg/dL (ref 8.7–10.4)
CHLORIDE: 98 mmol/L (ref 98–107)
CO2: 30 mmol/L (ref 20.0–31.0)
CREATININE: 0.96 mg/dL
EGFR CKD-EPI (2021) MALE: >90 mL/min/{1.73_m2} (ref >=60)
GLUCOSE RANDOM: 461 mg/dL (ref 70–179)
POTASSIUM: 3.6 mmol/L (ref 3.4–4.8)
PROTEIN TOTAL: 7.2 g/dL (ref 5.7–8.2)
SODIUM: 134 mmol/L — ABNORMAL LOW (ref 135–145)

## 2021-11-16 LAB — CBC W/ AUTO DIFF
BASOPHILS ABSOLUTE COUNT: 0 10*9/L (ref 0.0–0.1)
BASOPHILS RELATIVE PERCENT: 0.4 %
EOSINOPHILS ABSOLUTE COUNT: 0.1 10*9/L (ref 0.0–0.5)
EOSINOPHILS RELATIVE PERCENT: 1.1 %
HEMATOCRIT: 44.5 % (ref 39.0–48.0)
HEMOGLOBIN: 14.1 g/dL (ref 12.9–16.5)
LYMPHOCYTES ABSOLUTE COUNT: 1.2 10*9/L (ref 1.1–3.6)
LYMPHOCYTES RELATIVE PERCENT: 20.7 %
MEAN CORPUSCULAR HEMOGLOBIN CONC: 31.8 g/dL — ABNORMAL LOW (ref 32.0–36.0)
MEAN CORPUSCULAR HEMOGLOBIN: 23.8 pg — ABNORMAL LOW (ref 25.9–32.4)
MEAN CORPUSCULAR VOLUME: 74.8 fL — ABNORMAL LOW (ref 77.6–95.7)
MEAN PLATELET VOLUME: 9.2 fL (ref 6.8–10.7)
MONOCYTES ABSOLUTE COUNT: 0.3 10*9/L (ref 0.3–0.8)
MONOCYTES RELATIVE PERCENT: 5.7 %
NEUTROPHILS ABSOLUTE COUNT: 4.1 10*9/L (ref 1.8–7.8)
NEUTROPHILS RELATIVE PERCENT: 72.1 %
PLATELET COUNT: 308 10*9/L (ref 150–450)
RED BLOOD CELL COUNT: 5.95 10*12/L — ABNORMAL HIGH (ref 4.26–5.60)
RED CELL DISTRIBUTION WIDTH: 14.5 % (ref 12.2–15.2)
WBC ADJUSTED: 5.6 10*9/L (ref 3.6–11.2)

## 2021-11-16 LAB — SLIDE REVIEW

## 2021-11-16 LAB — T4, FREE: FREE T4: 0.68 ng/dL — ABNORMAL LOW (ref 0.89–1.76)

## 2021-11-16 LAB — TSH: THYROID STIMULATING HORMONE: 34.784 u[IU]/mL — ABNORMAL HIGH (ref 0.550–4.780)

## 2021-11-16 MED ORDER — DOXYCYCLINE HYCLATE 100 MG CAPSULE
ORAL_CAPSULE | Freq: Two times a day (BID) | ORAL | 0 refills | 10 days | Status: CP
Start: 2021-11-16 — End: 2021-11-26
  Filled 2021-11-18: qty 20, 10d supply, fill #0

## 2021-11-16 MED ORDER — HYDROCHLOROTHIAZIDE 25 MG TABLET
ORAL_TABLET | Freq: Every day | ORAL | 1 refills | 90 days | Status: CP
Start: 2021-11-16 — End: ?
  Filled 2021-11-18: qty 90, 90d supply, fill #0

## 2021-11-16 MED ORDER — LOSARTAN 100 MG TABLET
ORAL_TABLET | Freq: Every day | ORAL | 1 refills | 90 days | Status: CP
Start: 2021-11-16 — End: 2022-11-16
  Filled 2021-11-18: qty 90, 90d supply, fill #0

## 2021-11-16 MED ORDER — CLINDAMYCIN 1 % TOPICAL GEL
Freq: Two times a day (BID) | TOPICAL | 0 refills | 0 days | Status: CP
Start: 2021-11-16 — End: 2021-11-16

## 2021-11-16 NOTE — Unmapped (Signed)
Patient [facility] Cancer Support Encounter    Mr. Amor called to ask when his last appointment was to let the Medicaid transporter know. Communicated appointment times and locations for him.     Length of Encounter: 4 min    [name], [name], RN, [facility]   She/Her/Hers  Oncology Nurse Navigator - [name] [facility]  [facility]  [address], [place], [place] [address]    Center: [phone]  Voicemail: [phone]  [email or web address]

## 2021-11-16 NOTE — Unmapped (Signed)
Clinical Pharmacist Practitioner: [facility]cology [facility]    Patient Name: Paul Hines  Patient Age: 51 y.o.  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 Treatment restarted at that time and he is overall tolerating well again although notes more fatigue. We discussed dose reducing to 14 mg but he is not interested in that as he feels fatigue is manageable. Will be due for repeat scans in 1 month along with repeat TG.   Continue Lenvima 18 mg daily; consider DR at next visit to 14 mg    Thyroglobulin, Tumor Marker, IA (ng/mL)   Date Value   10/05/2021 662 (H)   06/15/2021 502 (H)   02/01/2021 323 (H)   01/04/2021 212 (H)   06/24/2020 43 (H)      2. Hidradenitis suppurativa: seen in ED on 11/07/21, prescribed ClindaGel but this is not covered by Medicaid. Cash price ($140) is cost-prohibitive to patient so will prescribe PO ABX and refer to derm.  Start doxycyline 100 mg BID x 10 days    3. Type 2 diabetes: glucose today high at 461. Hgb A1c checked on 10/05/21 and high at 12.9%. Restarted metformin and long acting insulin at 06/15/21 visit and referral placed to IM to establish care. Has not been checking BG at home.  Continue metformin 1000 mg BID and Basaglar 10 units SQ qhs  Encouraged testing BG at least once daily and recording values to bring to follow up appointments    4. Postsurgical hypothyroidism: goal for TSH suppression (<0.1). TSH today 34 and free T4 0.68. Levothyroxine restarted at last visit on 10/05/21. Will repeat thyroid labs in 1 month.   Continue levothyroxine 200 mcg daily    5. HTN: clinic BP above goal at 176/113 today, but no home readings. Has been back on antihypertensives since 10/05/21.  Increase losartan to 100 mg daily and hydrochlorothiazide to 25 mg daily    6. Peripheral neuropathy: present in hands, likely 2/2 T2DM. Moderately improved with gabapentin.  Continue gabapentin 300 mg BID    7. H/o ischemic cardiac disease s/p catheterization: refer to IM clinic. Lipid panel on 10/05/21 elevated with cholesterol 239, HDL 100, LDL 115, non HDL 161.   Continue atorvastatin 80 mg daily    F/U: RTC in 1 month    ______________________________________________________________________    HPI: Paul Hines is a 51 y.o. male with history of T2DM, hypertension, ischemic cardiac disease s/p catheterization in 2021 and metastatic thyroid cancer, s/p resection with Dr. Azucena Fallen on 04/12/20 and neck dissection on 07/28/20. Started Lenvima on 02/21/21 although has had issues with noncompliance.     Oral chemotherapy: Lenvima 18 mg daily  Start date: 02/21/21   Specialty pharmacy: [facility]  Copay: $0    Interim History:   - overall feeling okay back on Lenvima  - reports more fatigue but not bothersome  - does not have BP monitor at home  - no mucositis, HFS  - went to ED last week for HS, prescribed ClindaGel but not covered by insurance and cash price is cost-prohibitive  - not checking BG at home but continues insulin daily  - confirms he is taking other meds as prescribed    Adherence: confirms taking appropriately, rarely  misses doses since restarting last month  Drug-Drug Interactions: none per current med list    Oncology History:  Oncology History   Thyroid cancer (CMS-HCC)   03/09/2020 Initial Diagnosis    Thyroid cancer (CMS-HCC)     07/29/2021 -  Cancer Staged    Staging form: Thyroid - Differentiated, AJCC 8th Edition  - Clinical: Stage II (cM1, Age at diagnosis: < 55 years) - Signed by Dionicia Abler, DO on 07/29/2021           Problem List:   Patient Active Problem List   Diagnosis    Abnormal computed tomography angiography (CTA)    Thyroid cancer (CMS-HCC)    Obesity    Right inguinal hernia    Diabetes (CMS-HCC)    Essential hypertension       Medications:  Current Outpatient Medications   Medication Sig Dispense Refill    atorvastatin (LIPITOR) 80 MG tablet Take 1 tablet (80 mg total) by mouth nightly. 90 tablet 2    blood sugar diagnostic (ACCU-CHEK GUIDE TEST STRIPS) Strp Use to check blood glucose once daily. 100 strip 1    blood-glucose meter kit Test blood glucose level once daily. 1 each 0    doxycycline (VIBRAMYCIN) 100 MG capsule Take 1 capsule (100 mg total) by mouth two (2) times a day for 10 days. 20 capsule 0    gabapentin (NEURONTIN) 300 MG capsule Take 1 capsule (300 mg total) by mouth two (2) times a day. 180 capsule 2    hydroCHLOROthiazide (HYDRODIURIL) 25 MG tablet Take 1 tablet (25 mg total) by mouth daily. 90 tablet 1    insulin glargine (BASAGLAR, LANTUS) 100 unit/mL (3 mL) injection pen Inject 0.1 mL (10 Units total) under the skin nightly. Discard remainder of pen 28 days after initial use. 15 mL 0    lancets Misc Use to check blood glucose once daily. 100 each 1    lenvatinib 18 mg/day (10 mg x 1 AND 4 mg x 2) cap Take 3 capsules (18 mg) by mouth daily. 90 capsule 2    levothyroxine (SYNTHROID) 200 MCG tablet Take 1 tablet (200 mcg total) by mouth daily. 90 tablet 1    losartan (COZAAR) 100 MG tablet Take 1 tablet (100 mg total) by mouth daily. 90 tablet 1    metFORMIN (GLUCOPHAGE) 500 MG tablet Take 2 tablets (1,000 mg total) by mouth in the morning and 2 tablets (1,000 mg total) in the evening. Take with meals. 360 tablet 2    pen needle, diabetic 32 gauge x 5/32 (4 mm) Ndle Use with insulin glargine once daily 100 each 1     No current facility-administered medications for this visit.       Allergies:  Allergies   Allergen Reactions    Fish Derived     Lisinopril     Peanut Butter Flavor      Per allergy testing    Penicillins Other (See Comments)     Per patient, when he took at age 71 his legs and arms shook. Not sure if it was seizure or not.     Shellfish Containing Products     Soy        Personal and Social History:   Social History     Tobacco Use    Smoking status: Former     Types: Cigarettes     Quit date: 2018     Years since quitting: 5.7    Smokeless tobacco: Never  Substance Use Topics    Alcohol use: Not on file   He reports that he does not currently use drugs after having used the following drugs: Marijuana.    Family History:  His Family history is unknown by patient.    Review of Systems: A complete review of systems was obtained including: Constitutional, Eyes, ENT, Cardiovascular, Respiratory, GI, GU, Musculoskeletal, Skin, Neurological, Psychiatric, Endocrine, Heme/Lymphatic, and Allergic/Immunologic systems. All other systems reviewed and are negative to the patient???s management except for what was mentioned in the interim history.     Vital Signs: There were no vitals taken for this visit.    Laboratory Data:  Appointment on 11/16/2021   Component Date Value    WBC 11/16/2021 5.6     RBC 11/16/2021 5.95 (H)     HGB 11/16/2021 14.1     HCT 11/16/2021 44.5     MCV 11/16/2021 74.8 (L)     MCH 11/16/2021 23.8 (L)     MCHC 11/16/2021 31.8 (L)     RDW 11/16/2021 14.5     MPV 11/16/2021 9.2     Platelet 11/16/2021 308     Neutrophils % 11/16/2021 72.1     Lymphocytes % 11/16/2021 20.7     Monocytes % 11/16/2021 5.7     Eosinophils % 11/16/2021 1.1     Basophils % 11/16/2021 0.4     Absolute Neutrophils 11/16/2021 4.1     Absolute Lymphocytes 11/16/2021 1.2     Absolute Monocytes 11/16/2021 0.3     Absolute Eosinophils 11/16/2021 0.1     Absolute Basophils 11/16/2021 0.0     Microcytosis 11/16/2021 Slight (A)     Hypochromasia 11/16/2021 Marked (A)         I spent 20 minutes with Mr.Broxson in direct patient care.    Bobby Rumpf, PharmD, BCOP, CPP  Clinical Pharmacist Practitioner, [facility]an[facility] and Head & Neck Oncology

## 2021-11-16 NOTE — Unmapped (Signed)
[facility]  PCP: Andree Coss, MD    Consulting providers:  Otolaryngology: Dr. Azucena Fallen    Reason for Visit: follow-up for metastatic PTC    Assessment/Plan:    Paul Hines is a 51 y.o. with history of T2DM, hypertension, ischemic cardiac disease s/p catheterization in 2021 and metastatic thyroid cancer, s/p resection with Dr. Azucena Fallen on 04/12/20 and neck dissection on 07/28/20. Unfortunately developed numerous pulmonary metastases noted on PET/CT on 01/14/21. We initiated him on palliative treatment with Lenvima 18 mg daily on 02/21/21. He presents today in follow up on treatment.     # Metastatic Papillary Thyroid Cancer, BRAF WT  - Treatment goals are palliative.   - Tg: 75 (02/17/2020) --> 43 (06/24/2020) --> 212 (01/04/2021) --> 323 (02/01/21) ---> 502 (06/15/21) --> 662 (10/05/2021)  - Currently on Lenvima (02/21/21- current); very poor compliance due to housing issues and not having a phone  - Reports that tolerance is good over the past month  - PET/CT (09/29/2021): disease progression, discussed that scan results are not surprising given minimal Lenvima use prior to that time  -Tolerating treatment with minimal side effects aside from fatigue, discussed consideration of reducing Lenvima dose to 14 mg but he would prefer to remain at 18 mg for now  - RTC in 1 month with repeat scans and lab work.    # Hidradenitis suppurativa  - Poorly controlled, seen in ED 10/9 and prescribed clindamycin gel but not covered by insurance  - Will treat current flare with doxycycline 100 mg twice daily for 10 days  - Referred to dermatology, appointment not yet scheduled    # Hypertension, poor control  - Remains above goal today  - Increase Lorsartin 50 mg to 100 mg daily  - Increase HCTZ 12.5 to 25 mg daily  - Encouraged medical compliance  - Has been referred to internal medicine clinic but not yet scheduled    # Diabetes, ongoing hyperglycemia  - Poor control, last A1c 12.9%  - Managing with metformin 1000 mg twice daily and Basaglar 10 units nightly  - We also discussed importance of compliance on insulin.  [facility] referral as above    # Hypothyroidism  - TSH today down to 34 from 56, free T40.68  - Continue dose of synthroid 200 mcg once daily   - Goal TSH suppression to <0.1    # Supportive care  - Cancer-related pain: Continue on Gabapentin 300mg   - Psychosocial: denies any need for CCSP at this time.   - Constipation: Recommended Miralax     Follow up: RTC with scans in 1 month.    This patient was seen and discussed with Dr. Rosana Hoes who agrees with the findings and plan as above.    Chuck Hint, MD  Hematology & Oncology Fellow  --------------------------  Interval History  - Continues to endorse mild fatigue with productive cough and SOB. This has not significantly worsened over the past few months  - Better compliance with Lenvima, has only missed a dose a couple of times over the past month  - Biggest complaint is ongoing issues with skin abscesses from hidradenitis.  Previously prescribed clindamycin solution but this causes burning and Clindagel sent by ED was not covered by insurance.  - Denies NVD,  skin rash, mucositis  - Has had difficulty with housing    History of Present Illness:  Paul Hines is a 51 y.o. male with history of T2DM, hypertension, ischemic heart disease  s/p cardiac catheterization (07/2019) who presents for evaluation of head and neck cancer. I have reviewed his records including history, imaging, pathology reports, and, when applicable, operative notes and summarized his oncologic history below:     Patient was incidentally diagnosed on workup for chest pain in 2021. CTA cardiac (07/08/2019) showed bilateral pulmonary nodules and repeat imaging from 10/2019 and 12/2019 redemonstrated nodules. PET/CT obtained (10/2019) and was notable for hypermetabolic right hilar and mediastinal lymph nodes. FNA performed on 01/09/2020 to dominant right upper pulmonary nodule and confirmed papillary thyroid carcinoma. Further biopsy of lymph nodes were negative for malignancy.      He was evaluated by Dr. Azucena Fallen in February 2022. At that time he reports intermittent dysphagia, but was not significantly bothersome and did not limit his diet. He otherwise denied palpable neck mass, voice changes, otalgia or weight loss. Pre-treatment Tg was 75 in January 2022. Patient underwent total thyroidectomy on 04/12/2020 with bilateral neck dissection on 07/28/2020. Final pathology consistent with PTC, lymph node involvement (14/14) with positive ENE. Received adjuvant RAI (200 mCi I-131) on 05/20/2020.      PET/CT (01/14/2021) concerning for numerous pulmonary nodules that had increased in size when compared to prior imaging. Otherwise, no evidence of distant disease. Tumor marker was also noted to be elevated at 212 (01/04/2021).      Today: Present unaccompanied today, feeling well. He does report residual numbness all along the underside of his face. Previously smoked 2 packs of cigarettes per day for 14 years. Currently smoking ~3 cigarettes daily. He is not currently employed, but would like to work if possible. He was recently been released from prison and is currently staying with [facility].       Past Medical History:   Diagnosis Date    Abnormal findings on dx imaging of heart and cor circ     Acne vulgaris     Allergic contact dermatitis due to food in contact with skin     Atherosclerosis     Athscl heart disease of native coronary artery w/o ang pctrs     Chronic ischemic heart disease     Cutaneous abscess     Diabetic polyneuropathy (CMS-HCC)     Disorder of skin and subcutaneous tissue     Essential hypertension     H/O medication noncompliance     Hernia, inguinal, unilateral     Hypertrophic cardiomyopathy (CMS-HCC)     Malignant neoplasm of thyroid gland (CMS-HCC)     Presbyopia     Pseudofolliculitis barbae     Shoulder pain, bilateral     Tinea unguium     Type 2 diabetes mellitus (CMS-HCC) Xerosis cutis        Past Surgical History:   Procedure Laterality Date    PR BRNCHSC EBUS GUIDED SAMPL 1/2 NODE STATION/STRUX N/A 01/09/2020    Procedure: BRONCH, RIGID OR FLEXIBLE, INC FLUORO GUIDANCE, WHEN PERFORMED; WITH EBUS GUIDED TRANSTRACHEAL AND/OR TRANSBRONCHIAL SAMPLING, ONE OR TWO MEDIASTINAL AND/OR HILAR LYMPH NODE STATIONS OR STRUCTURES;  Surgeon: Jerelyn Charles, MD;  Location: MAIN OR [place];  Service: Pulmonary    PR BRNSCHSC TNDSC EBUS DX/TX INTERVENTION PERPH LES Right 01/09/2020    Procedure: BRONCH, RIGID OR FLEXIBLE, INCLUDING FLUORO GUIDANCE, WHEN PERFORMED; WITH TRANSENDOSCOPIC EBUS DURING BRONCHOSCOPIC DIAGNOSTIC OR THERAPEUTIC INTERVENTION(S) FOR PERIPHERAL LESION(S);  Surgeon: Jerelyn Charles, MD;  Location: MAIN OR [place];  Service: Pulmonary    PR BRONCHOSCOPY,COMPUTER ASSIST/IMAGE-GUIDED NAVIGATION Right 01/09/2020    Procedure: ROBOT ION BRONCHOSCOPY,RIGID OR  FLEXIBLE,INCLUDE FLUORO WHEN PERFORMED; W/COMPUTER-ASSIST,IMAGE-GUIDED NAVIGATION;  Surgeon: Jerelyn Charles, MD;  Location: MAIN OR [place];  Service: Pulmonary    PR BRONCHOSCOPY,DIAGNOSTIC W LAVAGE Right 01/09/2020    Procedure: BRONCHOSCOPY, RIGID OR FLEXIBLE, INCLUDE FLUOROSCOPIC GUIDANCE WHEN PERFORMED; W/BRONCHIAL ALVEOLAR LAVAGE;  Surgeon: Jerelyn Charles, MD;  Location: MAIN OR [place];  Service: Pulmonary    PR BRONCHOSCOPY,TRANSBRON ASPIR BX Right 01/09/2020    Procedure: BRONCHOSCOPY, RIGID/FLEX, INCL FLUORO; W/TRANSBRONCH NDL ASPIRAT BX, TRACHEA, MAIN STEM &/OR LOBAR BRONCHUS;  Surgeon: Jerelyn Charles, MD;  Location: MAIN OR [place];  Service: Pulmonary    PR BRONCHOSCOPY,TRANSBRONCH BIOPSY Right 01/09/2020    Procedure: BRONCHOSCOPY, RIGID/FLEXIBLE, INCLUDE FLUORO GUIDANCE WHEN PERFORMED; W/TRANSBRONCHIAL LUNG BX, SINGLE LOBE;  Surgeon: Jerelyn Charles, MD;  Location: MAIN OR [facility];  Service: Pulmonary    PR CATH PLACE/CORON ANGIO, IMG SUPER/INTERP,W LEFT HEART VENTRICULOGRAPHY N/A 08/26/2019 Procedure: Left Heart Catheterization;  Surgeon: Marlaine Hind, MD;  Location[facility] CATH;  Service: Cardiology    PR LIGATN THOR DUCT,CERV APPROACH Bilateral 07/28/2020    Procedure: SUTURE &/OR LIG THORACIC DUCT; CERV APPROACH;  Surgeon: Lauralee Evener, MD;  Location: MAIN OR [facility];  Service: ENT    PR REMOVAL NODES, NECK,CERV MOD RAD Bilateral 07/28/2020    Procedure: CERVICAL LYMPHADENECTOMY (MODIFIED RADICAL NECK DISSECTION);  Surgeon: Lauralee Evener, MD;  Location: MAIN OR [facility];  Service: ENT    PR THYROIDECTOMY,MALIG,LTD NECK SURG Bilateral 04/12/2020    Procedure: THYROIDECTOMY, TOTAL OR SUBTOTAL FOR MALIGNANCY; WITH LIMITED NECK DISSECTION;  Surgeon: Lauralee Evener, MD;  Location: MAIN OR [facility];  Service: ENT       Current Outpatient Medications   Medication Sig Dispense Refill    atorvastatin (LIPITOR) 80 MG tablet Take 1 tablet (80 mg total) by mouth nightly. 90 tablet 2    blood sugar diagnostic (ACCU-CHEK GUIDE TEST STRIPS) Strp Use to check blood glucose once daily. 100 strip 1    blood-glucose meter kit Test blood glucose level once daily. 1 each 0    gabapentin (NEURONTIN) 300 MG capsule Take 1 capsule (300 mg total) by mouth two (2) times a day. 180 capsule 2    hydroCHLOROthiazide (HYDRODIURIL) 12.5 MG tablet Take 1 tablet (12.5 mg total) by mouth daily. 90 tablet 1    insulin glargine (BASAGLAR, LANTUS) 100 unit/mL (3 mL) injection pen Inject 0.1 mL (10 Units total) under the skin nightly. Discard remainder of pen 28 days after initial use. 15 mL 0    lancets Misc Use to check blood glucose once daily. 100 each 1    lenvatinib 18 mg/day (10 mg x 1 AND 4 mg x 2) cap Take 3 capsules (18 mg) by mouth daily. 90 capsule 2    levothyroxine (SYNTHROID) 200 MCG tablet Take 1 tablet (200 mcg total) by mouth daily. 90 tablet 1    losartan (COZAAR) 50 MG tablet Take 1 tablet (50 mg total) by mouth daily. 90 tablet 2    metFORMIN (GLUCOPHAGE) 500 MG tablet Take 2 tablets (1,000 mg total) by mouth in the morning and 2 tablets (1,000 mg total) in the evening. Take with meals. 360 tablet 2    pen needle, diabetic 32 gauge x 5/32 (4 mm) Ndle Use with insulin glargine once daily 100 each 1     No current facility-administered medications for this visit.       Allergies   Allergen Reactions    Fish Derived     Lisinopril  Peanut Butter Flavor      Per allergy testing    Penicillins Other (See Comments)     Per patient, when he took at age 55 his legs and arms shook. Not sure if it was seizure or not.     Shellfish Containing Products     Soy        Social History     Tobacco Use    Smoking status: Former     Types: Cigarettes     Quit date: 2018     Years since quitting: 5.7    Smokeless tobacco: Never   Substance Use Topics    Drug use: Not Currently     Types: Marijuana     Comment: quit in 1992       Social History     Social History Narrative    Not on file       Family History   Family history unknown: Yes       Review of Systems: A 12-system review of systems was obtained including: Constitutional, Eyes, ENT, Cardiovascular, Respiratory, GI, GU, Musculoskeletal, Skin, Neurological, Psychiatric, Endocrine, Heme/Lymphatic, and Allergic/Immunologic systems. It is negative or non-contributory to the patient???s management except for as stated in patient's HPI    ECOG PS: 0    Physical Examination:  Vital Signs: BP 176/113  - Pulse 82  - Temp 36.4 ??C (97.6 ??F)  - Ht 170.2 cm (5' 7)  - Wt 79.8 kg (176 lb)  - SpO2 96%  - BMI 27.57 kg/m??   CONSTITUTIONAL: Well appearing man in NAD  Oral Cavity: MMM, no oral lesions or mucositis  Lymphatics: No lymphadenopathy   CV: RRR; no lower extremity edema  RESP: normal work of breathing  GI: Soft, non-tender, non-distended  SKIN: No skin rashes  NEURO: no focal deficits appreciated  PSYCH: Normal mood and appropriate affect    LABS  Lab Results   Component Value Date    WBC 5.5 10/05/2021    HGB 12.9 10/05/2021    HCT 39.6 10/05/2021    PLT 223 10/05/2021 Lab Results   Component Value Date    NA 133 (L) 10/05/2021    K 3.9 10/05/2021    CL 99 10/05/2021    CO2 29.0 10/05/2021    BUN 10 10/05/2021    CREATININE 1.02 10/05/2021    GLU 426 (HH) 10/05/2021    CALCIUM 8.7 10/05/2021    MG 1.6 07/31/2020    PHOS 3.0 07/31/2020       Lab Results   Component Value Date    BILITOT 0.2 (L) 10/05/2021    PROT 6.7 10/05/2021    ALBUMIN 3.1 (L) 10/05/2021    ALT 46 10/05/2021    AST 50 (H) 10/05/2021    ALKPHOS 116 10/05/2021       No results found for: PT, INR, APTT      IMAGING  PET/CT (09/29/21)   Diffuse background activity, most likely due to insufficient fasting prior to scanning limits evaluation.      -Numerous bilateral pulmonary nodules with apparent increase in size and probably number with the largest in the right middle lobe measuring 1.8 cm, worrisome for metastatic disease.      -Subcentimeter mildly FDG avid bilateral neck lymph nodes, concerning but not definitive for residual disease. Recommend ultrasound and FNA, as clinically indicated.      -Findings in the right inguinal region as described under Adenopathy, most compatible with improving infection. Recommend focused physical examination.      -  Cystic structure with a rim of FDG uptake in the left posterolateral scalp, grossly unchanged. Felt to be benign. Recommend focused physical exam.      -Recommend radioiodine scan to evaluate for inter-lesion heterogeneity and potentially radioiodine avid tissue.         PATHOLOGY  THYROID GLAND  8th Edition - Protocol posted: 03/27/2018  THYROID GLAND: RESECTION - All Specimens  Clinical History  No known radiation exposure    SPECIMEN   Procedure  Total thyroidectomy    TUMOR   Tumor Focality  Multifocal    Tumor Characteristics     Tumor Site  Right lobe      Left lobe      Isthmus    Histologic Type  Papillary carcinoma, classic (usual, conventional)    Tumor Size  Greatest Dimension (Centimeters): 3.1 cm   Extrathyroidal Extension  Not identified Angioinvasion (vascular invasion)  Not identified    Lymphatic Invasion  Not identified    Perineural Invasion  Not identified    Margins  Uninvolved by carcinoma    Distance of Invasive Carcinoma from Closest Margin (Millimeters)  1 mm   Mitotic Rate  4 Mitoses per 2 mm^2   LYMPH NODES   Number of Lymph Nodes Involved  14    Nodal Levels Involved  Level VI    Size of Largest Metastatic Deposit (Centimeters)  5 cm   Extranodal Extension (ENE)  Present    Number of Lymph Nodes Examined  14    Nodal Levels Examined  Level VI    PATHOLOGIC STAGE CLASSIFICATION (pTNM, AJCC 8th Edition)   TNM Descriptors  m (multiple primary tumors)         Primary Tumor (pT)  pT2    Regional Lymph Nodes (pN)  pN1a    Distant Metastasis (pM)  pM1    Site(s)  lung see [id number]

## 2021-11-16 NOTE — Unmapped (Signed)
Patient [facility] Cancer Support Encounter    Mr. Arceneaux called the Patient & [facility] for the number for his medicaid transport. Gave him the number for [facility]. He had not heard confirmation from them and wanted to check to make sure they were still coming at 9am for his appts today.     Length of Encounter: 4 min    [name], [name], RN, [facility]   She/Her/Hers  Oncology Nurse Navigator - [name] [facility]  [facility]  [address] [place], [place] [address]    Center: [phone]  Voicemail: [phone]  [email or web address]

## 2021-12-08 NOTE — Unmapped (Signed)
[facility] Refill Coordination Note    Specialty Medication(s) to be Shipped:   Hematology/Oncology: Paul Hines    Other medication(s) to be shipped: No additional medications requested for fill at this time     Paul Hines, DOB: 05-16-1970  Phone: There are no phone numbers on file.      All above HIPAA information was verified with patient.     Was a Nurse, learning disability used for this call? No    Completed refill call assessment today to schedule patient's medication shipment from the [facility] [phone]).  All relevant notes have been reviewed.     Specialty medication(s) and dose(s) confirmed: Regimen is correct and unchanged.   Changes to medications: Wilford reports no changes at this time.  Changes to insurance: No  New side effects reported not previously addressed with a pharmacist or physician: None reported  Questions for the pharmacist: No    Confirmed patient received a Conservation officer, historic buildings and a Surveyor, mining with first shipment. The patient will receive a drug information handout for each medication shipped and additional FDA Medication Guides as required.       DISEASE/MEDICATION-SPECIFIC INFORMATION        N/A    SPECIALTY MEDICATION ADHERENCE     Medication Adherence    Patient reported X missed doses in the last month: 0  Specialty Medication: Lenvima 18 mg/day  Patient is on additional specialty medications: No  Informant: patient                                Were doses missed due to medication being on hold? No    Lenvima 18  mg/day : 7 -10days of medicine on hand        REFERRAL TO PHARMACIST     Referral to the pharmacist: Not needed      [facility]     Shipping address confirmed in Epic.     Delivery Scheduled: Yes, Expected medication delivery date: 12/15/21.     Medication will be delivered via Next Day Courier to the prescription address in Epic WAM.    Unk Lightning   [facility] Specialty Technician

## 2021-12-08 NOTE — Unmapped (Signed)
Patient [facility] Cancer Support Encounter    Patient called the Patient & [facility]. Mr. Willmann needed to review his appointment days and times. Also asked for assistance applying to oncology financial support programs. I explained that he needs to be on active treatment to qualify, so he would need to be receiving chemo or radiation to apply. Mr. Blye understood and said he will stop into the [facility] at his next appointment.     [name], [name], RN, [facility]   She/Her/Hers  Oncology Nurse Navigator - [name] [facility]  [facility]  [address], [place], [place] [address]    Center: [phone]  Voicemail: [phone]  [email or web address]

## 2021-12-14 MED FILL — LENVIMA 18 MG/DAY (10 MG X 1 AND 4 MG X 2) CAPSULE: ORAL | 30 days supply | Qty: 90 | Fill #2

## 2021-12-19 NOTE — Unmapped (Signed)
Patient [facility] Cancer Support Encounter    Mr. Mosko called to ask about clarification on upcoming appointments and for the [facility] [employer] number. Mr. Greulich plans to stop into the Patient and [facility] on Wednesday. Mr. Lorette reported that he plans to move to [place] on Saturday. I told him he needs to make sure to tell us his new address and to call [facility] about the medication he gets delivered to his home to update them about his address change. Gave Mr. Puentes the [facility] phone number.     [name], [name], RN, [facility]   She/Her/Hers  Oncology Nurse Navigator - [name] [facility]  [facility]  [address] [place], [place] [address]    Center: [phone]  Voicemail: [phone]  [email or web address]

## 2021-12-21 ENCOUNTER — Other Ambulatory Visit: Admit: 2021-12-21 | Discharge: 2021-12-21 | Payer: PRIVATE HEALTH INSURANCE

## 2021-12-21 ENCOUNTER — Ambulatory Visit: Admit: 2021-12-21 | Discharge: 2021-12-21 | Payer: PRIVATE HEALTH INSURANCE

## 2021-12-21 ENCOUNTER — Ambulatory Visit
Admit: 2021-12-21 | Discharge: 2021-12-21 | Payer: PRIVATE HEALTH INSURANCE | Attending: Student in an Organized Health Care Education/Training Program | Primary: Student in an Organized Health Care Education/Training Program

## 2021-12-21 DIAGNOSIS — C73 Malignant neoplasm of thyroid gland: Principal | ICD-10-CM

## 2021-12-21 LAB — COMPREHENSIVE METABOLIC PANEL
ALBUMIN: 3.1 g/dL — ABNORMAL LOW (ref 3.4–5.0)
ALKALINE PHOSPHATASE: 88 U/L (ref 46–116)
ALT (SGPT): 20 U/L (ref 10–49)
ANION GAP: 7 mmol/L (ref 5–14)
AST (SGOT): 34 U/L (ref <=34)
BILIRUBIN TOTAL: 0.5 mg/dL (ref 0.3–1.2)
BLOOD UREA NITROGEN: 11 mg/dL (ref 9–23)
BUN / CREAT RATIO: 10
CALCIUM: 8.8 mg/dL (ref 8.7–10.4)
CHLORIDE: 100 mmol/L (ref 98–107)
CO2: 30 mmol/L (ref 20.0–31.0)
CREATININE: 1.06 mg/dL
EGFR CKD-EPI (2021) MALE: 84 mL/min/{1.73_m2} (ref >=60)
GLUCOSE RANDOM: 192 mg/dL — ABNORMAL HIGH (ref 70–179)
POTASSIUM: 3.6 mmol/L (ref 3.4–4.8)
PROTEIN TOTAL: 7.3 g/dL (ref 5.7–8.2)
SODIUM: 137 mmol/L (ref 135–145)

## 2021-12-21 LAB — CBC W/ AUTO DIFF
BASOPHILS ABSOLUTE COUNT: 0 10*9/L (ref 0.0–0.1)
BASOPHILS RELATIVE PERCENT: 0.3 %
EOSINOPHILS ABSOLUTE COUNT: 0.1 10*9/L (ref 0.0–0.5)
EOSINOPHILS RELATIVE PERCENT: 1.2 %
HEMATOCRIT: 40.2 % (ref 39.0–48.0)
HEMOGLOBIN: 13.1 g/dL (ref 12.9–16.5)
LYMPHOCYTES ABSOLUTE COUNT: 1.4 10*9/L (ref 1.1–3.6)
LYMPHOCYTES RELATIVE PERCENT: 21.6 %
MEAN CORPUSCULAR HEMOGLOBIN CONC: 32.5 g/dL (ref 32.0–36.0)
MEAN CORPUSCULAR HEMOGLOBIN: 24.3 pg — ABNORMAL LOW (ref 25.9–32.4)
MEAN CORPUSCULAR VOLUME: 74.6 fL — ABNORMAL LOW (ref 77.6–95.7)
MEAN PLATELET VOLUME: 8.6 fL (ref 6.8–10.7)
MONOCYTES ABSOLUTE COUNT: 0.5 10*9/L (ref 0.3–0.8)
MONOCYTES RELATIVE PERCENT: 7 %
NEUTROPHILS ABSOLUTE COUNT: 4.6 10*9/L (ref 1.8–7.8)
NEUTROPHILS RELATIVE PERCENT: 69.9 %
PLATELET COUNT: 262 10*9/L (ref 150–450)
RED BLOOD CELL COUNT: 5.38 10*12/L (ref 4.26–5.60)
RED CELL DISTRIBUTION WIDTH: 15.3 % — ABNORMAL HIGH (ref 12.2–15.2)
WBC ADJUSTED: 6.6 10*9/L (ref 3.6–11.2)

## 2021-12-21 LAB — TSH: THYROID STIMULATING HORMONE: 26.768 u[IU]/mL — ABNORMAL HIGH (ref 0.550–4.780)

## 2021-12-21 LAB — SLIDE REVIEW

## 2021-12-21 LAB — T4, FREE: FREE T4: 0.87 ng/dL — ABNORMAL LOW (ref 0.89–1.76)

## 2021-12-21 MED ORDER — AMLODIPINE 10 MG TABLET
ORAL_TABLET | Freq: Every day | ORAL | 3 refills | 90 days | Status: CP
Start: 2021-12-21 — End: 2022-12-21
  Filled 2022-01-19: qty 90, 90d supply, fill #0

## 2021-12-21 MED ORDER — INSULIN GLARGINE (U-100) 100 UNIT/ML (3 ML) SUBCUTANEOUS PEN
Freq: Every evening | SUBCUTANEOUS | 0 refills | 75 days | Status: CP
Start: 2021-12-21 — End: 2022-12-21

## 2021-12-21 MED ADMIN — iohexoL (OMNIPAQUE) 350 mg iodine/mL solution 75 mL: 75 mL | INTRAVENOUS | @ 14:00:00 | Stop: 2021-12-21

## 2021-12-21 NOTE — Unmapped (Signed)
Patient [facility] Cancer Support Encounter    Met with patient in the Patient & [facility]. Provided active listening and emotional support.     [name], [name], RN, [facility]   She/Her/Hers  Oncology Nurse Navigator - [name] [facility]  [facility]  [address] [place], [place] [address]    Center: [phone]  Voicemail: [phone]  [email or web address]

## 2021-12-21 NOTE — Unmapped (Signed)
Clinical Pharmacist Practitioner: [facility]    Patient Name: Paul Hines  Patient Age: 51 y.o.  Encounter Date: 12/21/2021  Primary Oncologist: Ginette Otto, DO, MPH    Reason for visit: oral chemotherapy follow-up    ASSESSMENT & PLAN:  1. Metastatic PTC: patient started on Lenvima on 02/21/21 and was tolerating it well, however had been off for 1-2 months at last visit in September 2023. Treatment restarted at that time and he is overall tolerating well again although notes more fatigue but it is manageable to him and he does not want to dose reduce. Scans today show SD.   Continue Lenvima 18 mg daily    Thyroglobulin, Tumor Marker, IA (ng/mL)   Date Value   10/05/2021 662 (H)   06/15/2021 502 (H)   02/01/2021 323 (H)   01/04/2021 212 (H)   06/24/2020 43 (H)     2. Type 2 diabetes: recently admitted for cocaine overdose and at that time had glucose >700. Discharged on Basaglar 15 units qhs (on 10 units PTA). Today glucose is 192. Has not been checking BG at home. Hgb A1c checked on 10/05/21 and high at 12.9%, repeated on 11/17/21 and increased to 14.6%.   Increase Basaglar to 20 units qhs and continue metformin 1000 mg BID  Encouraged testing BG at least once daily and recording values to bring to follow up appointments  Referral to IM clinic to establish care; would benefit from CGM system    3. Postsurgical hypothyroidism: goal for TSH suppression (<0.1). TSH today improved to 26 (from 34) and free T4 improved to 0.87 (from 0.68) compared to 11/16/21 labs. Levothyroxine restarted at last visit on 10/05/21. Will repeat thyroid labs in 1 month.   Continue levothyroxine 200 mcg daily    4. HTN: clinic BP above goal at 164/110 today, but no home readings. Has been back on antihypertensives since 10/05/21.  Start amlodipine 10 mg daily  Continue losartan 100 mg daily and hydrochlorothiazide to 25 mg daily     5. Hidradenitis suppurativa: seen in ED on 11/07/21, prescribed ClindaGel but this is not covered by Medicaid so prescribed doxy x 10 days with no improvement.   Referral to dermatology; patient provided with clinic number to make appt     6. Peripheral neuropathy: present in hands, likely 2/2 T2DM. Stable since last visit.  Continue gabapentin 300 mg BID    7. H/o ischemic cardiac disease s/p catheterization: refer to IM clinic. Lipid panel on 10/05/21 elevated with cholesterol 239, HDL 100, LDL 115, non HDL 161.   Continue atorvastatin 80 mg daily    F/U: RTC in 1 month    ______________________________________________________________________    HPI: Paul Hines is a 51 y.o. male with history of T2DM, hypertension, ischemic cardiac disease s/p catheterization in 2021 and metastatic thyroid cancer, s/p resection with Dr. Azucena Fallen on 04/12/20 and neck dissection on 07/28/20. Started Lenvima on 02/21/21 although has had issues with noncompliance.     Oral chemotherapy: Lenvima 18 mg daily  Start date: 02/21/21   Specialty pharmacy: [facility]  Copay: $0    Interim History:   - overall feeling okay  - admitted to [facility] last month for acute encephalopathy and hyperglycemia after cocaine overdose, glucose >700 on admission  - he continues to feel fatigued but stable from last visit and not bothersome  - does not check BG at home  - he usually gives 10 units of insulin at home, sometimes 20 units  if he eats more food that day  - confirms he is taking his other meds as prescribed  - does not have BP monitor at home  - reports HS abscesses have worsened, had no improvement with doxycycline   - no mucositis, HFS, nausea, diarrhea    Adherence: confirms taking appropriately, rarely misses doses since restarting in September  Drug-Drug Interactions: none per current med list    Oncology History:  Oncology History   Thyroid cancer (CMS-HCC)   03/09/2020 Initial Diagnosis    Thyroid cancer (CMS-HCC)     07/29/2021 -  Cancer Staged    Staging form: Thyroid - Differentiated, AJCC 8th Edition  - Clinical: Stage II (cM1, Age at diagnosis: < 55 years) - Signed by Dionicia Abler, DO on 07/29/2021           Problem List:   Patient Active Problem List   Diagnosis    Abnormal computed tomography angiography (CTA)    Thyroid cancer (CMS-HCC)    Obesity    Right inguinal hernia    Diabetes (CMS-HCC)    Essential hypertension       Medications:  Current Outpatient Medications   Medication Sig Dispense Refill    atorvastatin (LIPITOR) 80 MG tablet Take 1 tablet (80 mg total) by mouth nightly. 90 tablet 2    blood sugar diagnostic (ACCU-CHEK GUIDE TEST STRIPS) Strp Use to check blood glucose once daily. 100 strip 1    blood-glucose meter kit Test blood glucose level once daily. 1 each 0    gabapentin (NEURONTIN) 300 MG capsule Take 1 capsule (300 mg total) by mouth two (2) times a day. 180 capsule 2    hydroCHLOROthiazide (HYDRODIURIL) 25 MG tablet Take 1 tablet (25 mg total) by mouth daily. 90 tablet 1    lancets Misc Use to check blood glucose once daily. 100 each 1    lenvatinib 18 mg/day (10 mg x 1 AND 4 mg x 2) cap Take 3 capsules (18 mg) by mouth daily. 90 capsule 2    levothyroxine (SYNTHROID) 200 MCG tablet Take 1 tablet (200 mcg total) by mouth daily. 90 tablet 1    losartan (COZAAR) 100 MG tablet Take 1 tablet (100 mg total) by mouth daily. 90 tablet 1    metFORMIN (GLUCOPHAGE) 500 MG tablet Take 2 tablets (1,000 mg total) by mouth in the morning and 2 tablets (1,000 mg total) in the evening. Take with meals. 360 tablet 2    pen needle, diabetic 32 gauge x 5/32 (4 mm) Ndle Use with insulin glargine once daily 100 each 1    amlodipine (NORVASC) 10 MG tablet Take 1 tablet (10 mg total) by mouth daily. 90 tablet 3    insulin glargine (BASAGLAR, LANTUS) 100 unit/mL (3 mL) injection pen Inject 0.2 mL (20 Units total) under the skin nightly. 15 mL 0     No current facility-administered medications for this visit.       Allergies:  Allergies   Allergen Reactions    Penicillin Anaphylaxis    Fish Derived     Lisinopril     Peanut Butter Flavor      Per allergy testing    Penicillins Other (See Comments)     Per patient, when he took at age 45 his legs and arms shook. Not sure if it was seizure or not.     Shellfish Containing Products     Soy        Personal and Social History:  Social History     Tobacco Use    Smoking status: Former     Types: Cigarettes     Quit date: 2018     Years since quitting: 5.8    Smokeless tobacco: Never   Substance Use Topics    Alcohol use: Not on file   He reports that he does not currently use drugs after having used the following drugs: Marijuana.    Family History:  His Family history is unknown by patient.    Review of Systems: A complete review of systems was obtained including: Constitutional, Eyes, ENT, Cardiovascular, Respiratory, GI, GU, Musculoskeletal, Skin, Neurological, Psychiatric, Endocrine, Heme/Lymphatic, and Allergic/Immunologic systems. All other systems reviewed and are negative to the patient???s management except for what was mentioned in the interim history.     Vital Signs: BP 164/110 Comment: Provider notified. - Pulse 84  - Temp 36.8 ??C (98.2 ??F) (Temporal)  - Resp 18  - Ht 170.2 cm (5' 7)  - Wt 78.9 kg (173 lb 14.4 oz)  - SpO2 95%  - BMI 27.24 kg/m??     Laboratory Data:  Lab on 12/21/2021   Component Date Value    Sodium 12/21/2021 137     Potassium 12/21/2021 3.6     Chloride 12/21/2021 100     CO2 12/21/2021 30.0     Anion Gap 12/21/2021 7     BUN 12/21/2021 11     Creatinine 12/21/2021 1.06     BUN/Creatinine Ratio 12/21/2021 10     eGFR CKD-EPI (2021) Male 12/21/2021 84     Glucose 12/21/2021 192 (H)     Calcium 12/21/2021 8.8     Albumin 12/21/2021 3.1 (L)     Total Protein 12/21/2021 7.3     Total Bilirubin 12/21/2021 0.5     AST 12/21/2021 34     ALT 12/21/2021 20     Alkaline Phosphatase 12/21/2021 88     TSH 12/21/2021 26.768 (H)     Free T4 12/21/2021 0.87 (L)     WBC 12/21/2021 6.6     RBC 12/21/2021 5.38     HGB 12/21/2021 13.1     HCT 12/21/2021 40.2     MCV 12/21/2021 74.6 (L)     MCH 12/21/2021 24.3 (L)     MCHC 12/21/2021 32.5     RDW 12/21/2021 15.3 (H)     MPV 12/21/2021 8.6     Platelet 12/21/2021 262     Neutrophils % 12/21/2021 69.9     Lymphocytes % 12/21/2021 21.6     Monocytes % 12/21/2021 7.0     Eosinophils % 12/21/2021 1.2     Basophils % 12/21/2021 0.3     Absolute Neutrophils 12/21/2021 4.6     Absolute Lymphocytes 12/21/2021 1.4     Absolute Monocytes 12/21/2021 0.5     Absolute Eosinophils 12/21/2021 0.1     Absolute Basophils 12/21/2021 0.0     Microcytosis 12/21/2021 Slight (A)     Hypochromasia 12/21/2021 Marked (A)     Smear Review Comments 12/21/2021 See Comment (A)     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    Bobby Rumpf, PharmD, BCOP, CPP  Clinical Pharmacist Practitioner, [facility]

## 2021-12-21 NOTE — Unmapped (Signed)
[facility]  PCP: Andree Coss, MD    Consulting providers:  Otolaryngology: Dr. Azucena Fallen    Reason for Visit: follow-up for metastatic PTC, scan review    Assessment/Plan:    [name] is a 51 y.o. man with history of poorly controlled T2DM, hypertension, ischemic cardiac disease s/p catheterization in 2021 and metastatic thyroid cancer, s/p resection with Dr. Azucena Fallen on 04/12/20 and neck dissection on 07/28/20. Noted to have metastatic disease with pulmonary involvement on PET/CT (01/14/2022). Treating with 1L Lenvima 18 mg daily (02/21/21-current). Course has been complicated by poor compliance. He presents today in follow up on treatment and review of same-day scans.     # Metastatic Papillary Thyroid Cancer, BRAF WT  - Treatment goals are palliative.   - Tg: 75 (02/17/2020) --> 43 (06/24/2020) --> 212 (01/04/2021) --> 323 (02/01/21) ---> 502 (06/15/21) --> 662 (10/05/2021)  - Currently on Lenvima (02/21/21- current); c/b by poor compliance due to housing issues and lack of phone  - PET/CT (09/29/2021): disease progression, discussed that scan results are not surprising given minimal Lenvima use prior to that time  - Reports improved tolerance over the past month  - CT Neck/Chest (12/21/2021): reviewed today; stable disease  - Continue Lenvima 18 mg daily; pt does not want to DR  - RTC in 1 month with repeat scans and lab work.    # Hidradenitis suppurativa  - Poorly controlled, seen in ED 10/9 and prescribed clindamycin gel but not covered by insurance  - No relief with a 10 day course of doxycycline prescribed at last visit  - Referred to dermatology, appointment not yet scheduled    # Hypertension, poor control  - 164/110, remains above goal today   - Patient reports he has not taken any of his medications this morning as he was told not to take anything before his scans  - Current regimen includes Lorsartin100 mg and HCTZ 25 mg daily (both doubled at last visit)  - Encouraged medical compliance  - Has been referred to internal medicine clinic but not yet scheduled    # Diabetes, ongoing hyperglycemia  - Poor control, last A1c 14.6% (11/17/21)  - Managing with metformin 1000 mg twice daily and Basaglar 10 units nightly  - We also discussed importance of compliance on insulin.  [facility] referral as above    # Hypothyroidism  - At last check (11/17/21 labs from [facility]), TSH up to 45.84 from 34 (but overall down from 56), free T4 0.51.   - Continue dose of synthroid 200 mcg once daily. Again discussed medication compliance, as better control of his thyroid function may improve fatigue.  - Goal TSH suppression to <0.1    # Supportive care  - Cancer-related pain: Continue on Gabapentin 300mg   - Psychosocial: denies any need for CCSP at this time.   - Constipation: Recommended Miralax     Follow up: 6 weeks    Ginette Otto, DO, MPH  Head and Neck Medical Oncology  [facility]  --------------------------  Interval History  - Admitted to [facility] from 10/19-10/21 for acute encephalopathy and hyperglycemia after being found down at home with a GCS of 3 and sats of 70% on RA. Patient awoke after IN and IV narcan. Glucose >700 on admission and the patient's Glargine was increased to 15u daily at discharge.  - Energy levels are stabily low  - Denies NVD, skin rash, or mucositis  - Productive cough is stable  -  Does report his hydradenitis skin abscesses have worsened. No improvement with doxycycline and he has not yet established care with dermatology  - Reports good compliance with his Lenvima, but has missed several doses of his synthroid  - He has been living in [place], but is planning to move to [facility]  - Presents to clinic by himself    History of Present Illness:  Paul Hines is a 51 y.o. male with history of T2DM, hypertension, ischemic heart disease s/p cardiac catheterization (07/2019) who presents for evaluation of head and neck cancer. I have reviewed his records including history, imaging, pathology reports, and, when applicable, operative notes and summarized his oncologic history below:     Patient was incidentally diagnosed on workup for chest pain in 2021. CTA cardiac (07/08/2019) showed bilateral pulmonary nodules and repeat imaging from 10/2019 and 12/2019 redemonstrated nodules. PET/CT obtained (10/2019) and was notable for hypermetabolic right hilar and mediastinal lymph nodes. FNA performed on 01/09/2020 to dominant right upper pulmonary nodule and confirmed papillary thyroid carcinoma. Further biopsy of lymph nodes were negative for malignancy.      He was evaluated by Dr. Azucena Fallen in February 2022. At that time he reports intermittent dysphagia, but was not significantly bothersome and did not limit his diet. He otherwise denied palpable neck mass, voice changes, otalgia or weight loss. Pre-treatment Tg was 75 in January 2022. Patient underwent total thyroidectomy on 04/12/2020 with bilateral neck dissection on 07/28/2020. Final pathology consistent with PTC, lymph node involvement (14/14) with positive ENE. Received adjuvant RAI (200 mCi I-131) on 05/20/2020.      PET/CT (01/14/2021) concerning for numerous pulmonary nodules that had increased in size when compared to prior imaging. Otherwise, no evidence of distant disease. Tumor marker was also noted to be elevated at 212 (01/04/2021).      02/01/2021: Present unaccompanied today, feeling well. He does report residual numbness all along the underside of his face. Previously smoked 2 packs of cigarettes per day for 14 years. Currently smoking ~3 cigarettes daily. He is not currently employed, but would like to work if possible. He was recently been released from prison and is currently staying with [facility].     Past Medical History:   Diagnosis Date    Abnormal findings on dx imaging of heart and cor circ     Acne vulgaris     Allergic contact dermatitis due to food in contact with skin     Atherosclerosis     Athscl heart disease of native coronary artery w/o ang pctrs     Chronic ischemic heart disease     Cutaneous abscess     Diabetic polyneuropathy (CMS-HCC)     Disorder of skin and subcutaneous tissue     Essential hypertension     H/O medication noncompliance     Hernia, inguinal, unilateral     Hypertrophic cardiomyopathy (CMS-HCC)     Malignant neoplasm of thyroid gland (CMS-HCC)     Presbyopia     Pseudofolliculitis barbae     Shoulder pain, bilateral     Tinea unguium     Type 2 diabetes mellitus (CMS-HCC)     Xerosis cutis        Past Surgical History:   Procedure Laterality Date    PR BRNCHSC EBUS GUIDED SAMPL 1/2 NODE STATION/STRUX N/A 01/09/2020    Procedure: BRONCH, RIGID OR FLEXIBLE, INC FLUORO GUIDANCE, WHEN PERFORMED; WITH EBUS GUIDED TRANSTRACHEAL AND/OR TRANSBRONCHIAL SAMPLING, ONE OR TWO MEDIASTINAL AND/OR HILAR LYMPH NODE STATIONS  OR STRUCTURES;  Surgeon: Jerelyn Charles, MD;  Location: MAIN OR [facility];  Service: Pulmonary    PR BRNSCHSC TNDSC EBUS DX/TX INTERVENTION PERPH LES Right 01/09/2020    Procedure: BRONCH, RIGID OR FLEXIBLE, INCLUDING FLUORO GUIDANCE, WHEN PERFORMED; WITH TRANSENDOSCOPIC EBUS DURING BRONCHOSCOPIC DIAGNOSTIC OR THERAPEUTIC INTERVENTION(S) FOR PERIPHERAL LESION(S);  Surgeon: Jerelyn Charles, MD;  Location: MAIN OR [place];  Service: Pulmonary    PR BRONCHOSCOPY,COMPUTER ASSIST/IMAGE-GUIDED NAVIGATION Right 01/09/2020    Procedure: ROBOT ION BRONCHOSCOPY,RIGID OR FLEXIBLE,INCLUDE FLUORO WHEN PERFORMED; W/COMPUTER-ASSIST,IMAGE-GUIDED NAVIGATION;  Surgeon: Jerelyn Charles, MD;  Location: MAIN OR [place];  Service: Pulmonary    PR BRONCHOSCOPY,DIAGNOSTIC W LAVAGE Right 01/09/2020    Procedure: BRONCHOSCOPY, RIGID OR FLEXIBLE, INCLUDE FLUOROSCOPIC GUIDANCE WHEN PERFORMED; W/BRONCHIAL ALVEOLAR LAVAGE;  Surgeon: Jerelyn Charles, MD;  Location: MAIN OR [place];  Service: Pulmonary    PR BRONCHOSCOPY,TRANSBRON ASPIR BX Right 01/09/2020    Procedure: BRONCHOSCOPY, RIGID/FLEX, INCL FLUORO; W/TRANSBRONCH NDL ASPIRAT BX, TRACHEA, MAIN STEM &/OR LOBAR BRONCHUS;  Surgeon: Jerelyn Charles, MD;  Location: MAIN OR [place];  Service: Pulmonary    PR BRONCHOSCOPY,TRANSBRONCH BIOPSY Right 01/09/2020    Procedure: BRONCHOSCOPY, RIGID/FLEXIBLE, INCLUDE FLUORO GUIDANCE WHEN PERFORMED; W/TRANSBRONCHIAL LUNG BX, SINGLE LOBE;  Surgeon: Jerelyn Charles, MD;  Location: MAIN OR [facility];  Service: Pulmonary    PR CATH PLACE/CORON ANGIO, IMG SUPER/INTERP,W LEFT HEART VENTRICULOGRAPHY N/A 08/26/2019    Procedure: Left Heart Catheterization;  Surgeon: Marlaine Hind, MD;  Location: [facility] CATH;  Service: Cardiology    PR LIGATN THOR DUCT,CERV APPROACH Bilateral 07/28/2020    Procedure: SUTURE &/OR LIG THORACIC DUCT; CERV APPROACH;  Surgeon: Lauralee Evener, MD;  Location: MAIN OR [facility];  Service: ENT    PR REMOVAL NODES, NECK,CERV MOD RAD Bilateral 07/28/2020    Procedure: CERVICAL LYMPHADENECTOMY (MODIFIED RADICAL NECK DISSECTION);  Surgeon: Lauralee Evener, MD;  Location: MAIN OR [facility];  Service: ENT    PR THYROIDECTOMY,MALIG,LTD NECK SURG Bilateral 04/12/2020    Procedure: THYROIDECTOMY, TOTAL OR SUBTOTAL FOR MALIGNANCY; WITH LIMITED NECK DISSECTION;  Surgeon: Lauralee Evener, MD;  Location: MAIN OR [facility];  Service: ENT       Current Outpatient Medications   Medication Sig Dispense Refill    atorvastatin (LIPITOR) 80 MG tablet Take 1 tablet (80 mg total) by mouth nightly. 90 tablet 2    blood sugar diagnostic (ACCU-CHEK GUIDE TEST STRIPS) Strp Use to check blood glucose once daily. 100 strip 1    blood-glucose meter kit Test blood glucose level once daily. 1 each 0    gabapentin (NEURONTIN) 300 MG capsule Take 1 capsule (300 mg total) by mouth two (2) times a day. 180 capsule 2    hydroCHLOROthiazide (HYDRODIURIL) 25 MG tablet Take 1 tablet (25 mg total) by mouth daily. 90 tablet 1    lancets Misc Use to check blood glucose once daily. 100 each 1    lenvatinib 18 mg/day (10 mg x 1 AND 4 mg x 2) cap Take 3 capsules (18 mg) by mouth daily. 90 capsule 2    levothyroxine (SYNTHROID) 200 MCG tablet Take 1 tablet (200 mcg total) by mouth daily. 90 tablet 1    losartan (COZAAR) 100 MG tablet Take 1 tablet (100 mg total) by mouth daily. 90 tablet 1    metFORMIN (GLUCOPHAGE) 500 MG tablet Take 2 tablets (1,000 mg total) by mouth in the morning and 2 tablets (1,000 mg total) in the evening. Take with meals. 360 tablet 2    pen needle,  diabetic 32 gauge x 5/32 (4 mm) Ndle Use with insulin glargine once daily 100 each 1    amlodipine (NORVASC) 10 MG tablet Take 1 tablet (10 mg total) by mouth daily. 90 tablet 3    insulin glargine (BASAGLAR, LANTUS) 100 unit/mL (3 mL) injection pen Inject 0.2 mL (20 Units total) under the skin nightly. 15 mL 0     No current facility-administered medications for this visit.       Allergies   Allergen Reactions    Penicillin Anaphylaxis    Fish Derived     Lisinopril     Peanut Butter Flavor      Per allergy testing    Penicillins Other (See Comments)     Per patient, when he took at age 52 his legs and arms shook. Not sure if it was seizure or not.     Shellfish Containing Products     Soy        Social History     Tobacco Use    Smoking status: Former     Types: Cigarettes     Quit date: 2018     Years since quitting: 5.8    Smokeless tobacco: Never   Substance Use Topics    Drug use: Not Currently     Types: Marijuana     Comment: quit in 1992       Social History     Social History Narrative    Not on file       Family History   Family history unknown: Yes       Review of Systems: A 12-system review of systems was obtained including: Constitutional, Eyes, ENT, Cardiovascular, Respiratory, GI, GU, Musculoskeletal, Skin, Neurological, Psychiatric, Endocrine, Heme/Lymphatic, and Allergic/Immunologic systems. It is negative or non-contributory to the patient???s management except for as stated in patient's HPI    ECOG PS: 0    Physical Examination:  Vital Signs: BP 164/110 Comment: Provider notified. - Pulse 84  - Temp 36.8 ??C (98.2 ??F) (Temporal)  - Resp 18  - Ht 170.2 cm (5' 7)  - Wt 78.9 kg (173 lb 14.4 oz)  - SpO2 95%  - BMI 27.24 kg/m??   CONSTITUTIONAL: Well appearing man in NAD  Oral Cavity: MMM, no oral lesions or mucositis  Lymphatics: No lymphadenopathy   CV: RRR; no lower extremity edema  RESP: normal work of breathing  GI: Soft, non-tender, non-distended  SKIN: No skin rashes  NEURO: no focal deficits appreciated  PSYCH: Normal mood and appropriate affect    LABS  Lab Results   Component Value Date    WBC 6.6 12/21/2021    HGB 13.1 12/21/2021    HCT 40.2 12/21/2021    PLT 262 12/21/2021       Lab Results   Component Value Date    NA 137 12/21/2021    K 3.6 12/21/2021    CL 100 12/21/2021    CO2 30.0 12/21/2021    BUN 11 12/21/2021    CREATININE 1.06 12/21/2021    GLU 192 (H) 12/21/2021    CALCIUM 8.8 12/21/2021    MG 1.6 07/31/2020    PHOS 3.0 07/31/2020       Lab Results   Component Value Date    BILITOT 0.5 12/21/2021    PROT 7.3 12/21/2021    ALBUMIN 3.1 (L) 12/21/2021    ALT 20 12/21/2021    AST 34 12/21/2021    ALKPHOS 88 12/21/2021  No results found for: PT, INR, APTT      IMAGING  CT Neck (12/21/21)  --No definite evidence of recurrent tumor in the thyroid bed. Presumed prominent jugular / scarring on the right, not adenopathy at level 3-4 on the right. Please note the exam is limited by motion, contrast bolus timing, and paucity of fat. Consider ultrasound to assess this location if clinically indicated.  --Development of right vocal cord paralysis compared to the preceding PET/CT from 09/29/2021.    CT Chest (12/21/21)  --Stable multifocal bilateral pulmonary metastasis.  --Stable sclerotic lesions involving the right posterior 12th rib and cortical sclerosis of right posterior eighth rib. No new osseous lesions.    PET/CT (09/29/21)   Diffuse background activity, most likely due to insufficient fasting prior to scanning limits evaluation.      -Numerous bilateral pulmonary nodules with apparent increase in size and probably number with the largest in the right middle lobe measuring 1.8 cm, worrisome for metastatic disease.      -Subcentimeter mildly FDG avid bilateral neck lymph nodes, concerning but not definitive for residual disease. Recommend ultrasound and FNA, as clinically indicated.      -Findings in the right inguinal region as described under Adenopathy, most compatible with improving infection. Recommend focused physical examination.      -Cystic structure with a rim of FDG uptake in the left posterolateral scalp, grossly unchanged. Felt to be benign. Recommend focused physical exam.      -Recommend radioiodine scan to evaluate for inter-lesion heterogeneity and potentially radioiodine avid tissue.       PATHOLOGY  THYROID GLAND  8th Edition - Protocol posted: 03/27/2018   THYROID GLAND: RESECTION - All Specimens  Clinical History  No known radiation exposure    SPECIMEN   Procedure  Total thyroidectomy    TUMOR   Tumor Focality  Multifocal    Tumor Characteristics     Tumor Site  Right lobe      Left lobe      Isthmus    Histologic Type  Papillary carcinoma, classic (usual, conventional)    Tumor Size  Greatest Dimension (Centimeters): 3.1 cm   Extrathyroidal Extension  Not identified    Angioinvasion (vascular invasion)  Not identified    Lymphatic Invasion  Not identified    Perineural Invasion  Not identified    Margins  Uninvolved by carcinoma    Distance of Invasive Carcinoma from Closest Margin (Millimeters)  1 mm   Mitotic Rate  4 Mitoses per 2 mm^2   LYMPH NODES   Number of Lymph Nodes Involved  14    Nodal Levels Involved  Level VI    Size of Largest Metastatic Deposit (Centimeters)  5 cm   Extranodal Extension (ENE)  Present    Number of Lymph Nodes Examined  14    Nodal Levels Examined  Level VI    PATHOLOGIC STAGE CLASSIFICATION (pTNM, AJCC 8th Edition)   TNM Descriptors  m (multiple primary tumors)         Primary Tumor (pT)  pT2    Regional Lymph Nodes (pN)  pN1a    Distant Metastasis (pM)  pM1    Site(s)  lung see [id number]      I have personally reviewed relevant imaging, laboratory values, existing medical records, and pathology. I have summarized these findings in the oncology history above.    Scribe's Attestation: Ginette Otto, DO obtained and performed the history, physical exam and medical decision making elements that were entered  into the chart. Signed by Welton Flakes, Scribe, on December 21, 2021 at 12:16 PM     Physician Attestation: I reviewed Welton Flakes' note and I agree with his findings and plan. Signed by Ginette Otto. Medical Oncology

## 2021-12-21 NOTE — Unmapped (Addendum)
Medication changes made today:   - Start amlodipine 1 pill daily for blood pressure. Continue your other blood pressure medicines as prescribed.   - Increase insulin to 20 units daily    Please call the dermatology office to schedule an appointment with a skin doctor regarding your skin boils: [phone]    Please call the internal medicine office to schedule an appointment with a doctor to help with your diabetes: [phone]

## 2022-01-06 DIAGNOSIS — C73 Malignant neoplasm of thyroid gland: Principal | ICD-10-CM

## 2022-01-10 DIAGNOSIS — C73 Malignant neoplasm of thyroid gland: Principal | ICD-10-CM

## 2022-01-10 DIAGNOSIS — C799 Secondary malignant neoplasm of unspecified site: Principal | ICD-10-CM

## 2022-01-10 MED ORDER — LENVIMA 18 MG/DAY (10 MG X 1 AND 4 MG X 2) CAPSULE
ORAL_CAPSULE | Freq: Every day | ORAL | 2 refills | 30.00000 days
Start: 2022-01-10 — End: ?

## 2022-01-12 DIAGNOSIS — C799 Secondary malignant neoplasm of unspecified site: Principal | ICD-10-CM

## 2022-01-12 DIAGNOSIS — C73 Malignant neoplasm of thyroid gland: Principal | ICD-10-CM

## 2022-01-16 DIAGNOSIS — C73 Malignant neoplasm of thyroid gland: Principal | ICD-10-CM

## 2022-01-16 DIAGNOSIS — C799 Secondary malignant neoplasm of unspecified site: Principal | ICD-10-CM

## 2022-01-18 DIAGNOSIS — C73 Malignant neoplasm of thyroid gland: Principal | ICD-10-CM

## 2022-01-18 DIAGNOSIS — C799 Secondary malignant neoplasm of unspecified site: Principal | ICD-10-CM

## 2022-01-18 MED ORDER — LENVIMA 18 MG/DAY (10 MG X 1 AND 4 MG X 2) CAPSULE
ORAL_CAPSULE | Freq: Every day | ORAL | 2 refills | 30 days
Start: 2022-01-18 — End: ?

## 2022-01-19 MED ORDER — LENVIMA 18 MG/DAY (10 MG X 1 AND 4 MG X 2) CAPSULE
ORAL_CAPSULE | Freq: Every day | ORAL | 2 refills | 30 days | Status: CP
Start: 2022-01-19 — End: ?
  Filled 2022-02-17: qty 90, 30d supply, fill #0

## 2022-01-19 MED FILL — GABAPENTIN 300 MG CAPSULE: ORAL | 90 days supply | Qty: 180 | Fill #1

## 2022-01-19 NOTE — Unmapped (Signed)
[facility] Refill Coordination Note    Specialty Medication(s) to be Shipped:   Hematology/Oncology: [name] ((DENIED))    Other medication(s) to be shipped: No additional medications requested for fill at this time     Paul Hines, DOB: [birth year]  Phone: There are no phone numbers on file.      All above HIPAA information was verified with patient.     Was a Nurse, learning disability used for this call? No    Completed refill call assessment today to schedule patient's medication shipment from the [facility] [phone]).  All relevant notes have been reviewed.     Specialty medication(s) and dose(s) confirmed: Regimen is correct and unchanged.   Changes to medications: Paul Hines reports no changes at this time.  Changes to insurance: No  New side effects reported not previously addressed with a pharmacist or physician: None reported  Questions for the pharmacist: No    Confirmed patient received a Conservation officer, historic buildings and a Surveyor, mining with first shipment. The patient will receive a drug information handout for each medication shipped and additional FDA Medication Guides as required.       DISEASE/MEDICATION-SPECIFIC INFORMATION        N/A    SPECIALTY MEDICATION ADHERENCE     Medication Adherence    Patient reported X missed doses in the last month: 2  Specialty Medication: Lenvima 18mg /day  Patient is on additional specialty medications: No  Informant: patient                       Were doses missed due to medication being on hold? No    Lenvima 18  mg/day : 20 days of medicine on hand        REFERRAL TO PHARMACIST     Referral to the pharmacist: Not needed      [facility]     Shipping address confirmed in Epic.     Delivery Scheduled: Patient declined refill at this time due to has 20 days.     Medication will be delivered via Next Day Courier to the prescription address in Epic [place].    Paul Hines Paul Hines   [facility] Specialty Technician

## 2022-01-26 DIAGNOSIS — C73 Malignant neoplasm of thyroid gland: Principal | ICD-10-CM

## 2022-02-01 ENCOUNTER — Other Ambulatory Visit: Admit: 2022-02-01 | Discharge: 2022-02-01 | Payer: PRIVATE HEALTH INSURANCE

## 2022-02-01 ENCOUNTER — Ambulatory Visit
Admit: 2022-02-01 | Discharge: 2022-02-01 | Payer: PRIVATE HEALTH INSURANCE | Attending: Student in an Organized Health Care Education/Training Program | Primary: Student in an Organized Health Care Education/Training Program

## 2022-02-01 ENCOUNTER — Ambulatory Visit: Admit: 2022-02-01 | Discharge: 2022-02-01 | Payer: PRIVATE HEALTH INSURANCE | Attending: Oncology | Primary: Oncology

## 2022-02-01 DIAGNOSIS — C73 Malignant neoplasm of thyroid gland: Principal | ICD-10-CM

## 2022-02-01 DIAGNOSIS — R7309 Other abnormal glucose: Principal | ICD-10-CM

## 2022-02-01 DIAGNOSIS — I10 Essential (primary) hypertension: Principal | ICD-10-CM

## 2022-02-01 DIAGNOSIS — L732 Hidradenitis suppurativa: Principal | ICD-10-CM

## 2022-02-01 DIAGNOSIS — L0231 Cutaneous abscess of buttock: Principal | ICD-10-CM

## 2022-02-01 DIAGNOSIS — E119 Type 2 diabetes mellitus without complications: Principal | ICD-10-CM

## 2022-02-01 DIAGNOSIS — R197 Diarrhea, unspecified: Principal | ICD-10-CM

## 2022-02-01 LAB — COMPREHENSIVE METABOLIC PANEL
ALBUMIN: 3.6 g/dL (ref 3.4–5.0)
ALKALINE PHOSPHATASE: 88 U/L (ref 46–116)
ALT (SGPT): 17 U/L (ref 10–49)
ANION GAP: 8 mmol/L (ref 5–14)
AST (SGOT): 24 U/L (ref <=34)
BILIRUBIN TOTAL: 0.3 mg/dL (ref 0.3–1.2)
BLOOD UREA NITROGEN: 9 mg/dL (ref 9–23)
BUN / CREAT RATIO: 9
CALCIUM: 9.1 mg/dL (ref 8.7–10.4)
CHLORIDE: 101 mmol/L (ref 98–107)
CO2: 28 mmol/L (ref 20.0–31.0)
CREATININE: 1.01 mg/dL
EGFR CKD-EPI (2021) MALE: 89 mL/min/{1.73_m2} (ref >=60)
GLUCOSE RANDOM: 171 mg/dL (ref 70–179)
POTASSIUM: 3.4 mmol/L (ref 3.4–4.8)
PROTEIN TOTAL: 8.2 g/dL (ref 5.7–8.2)
SODIUM: 137 mmol/L (ref 135–145)

## 2022-02-01 LAB — CBC W/ AUTO DIFF
BASOPHILS ABSOLUTE COUNT: 0 10*9/L (ref 0.0–0.1)
BASOPHILS RELATIVE PERCENT: 0.2 %
EOSINOPHILS ABSOLUTE COUNT: 0.1 10*9/L (ref 0.0–0.5)
EOSINOPHILS RELATIVE PERCENT: 0.7 %
HEMATOCRIT: 41.8 % (ref 39.0–48.0)
HEMOGLOBIN: 13.8 g/dL (ref 12.9–16.5)
LYMPHOCYTES ABSOLUTE COUNT: 1.1 10*9/L (ref 1.1–3.6)
LYMPHOCYTES RELATIVE PERCENT: 13.5 %
MEAN CORPUSCULAR HEMOGLOBIN CONC: 32.9 g/dL (ref 32.0–36.0)
MEAN CORPUSCULAR HEMOGLOBIN: 24.6 pg — ABNORMAL LOW (ref 25.9–32.4)
MEAN CORPUSCULAR VOLUME: 74.7 fL — ABNORMAL LOW (ref 77.6–95.7)
MEAN PLATELET VOLUME: 8.5 fL (ref 6.8–10.7)
MONOCYTES ABSOLUTE COUNT: 0.6 10*9/L (ref 0.3–0.8)
MONOCYTES RELATIVE PERCENT: 6.9 %
NEUTROPHILS ABSOLUTE COUNT: 6.3 10*9/L (ref 1.8–7.8)
NEUTROPHILS RELATIVE PERCENT: 78.7 %
PLATELET COUNT: 263 10*9/L (ref 150–450)
RED BLOOD CELL COUNT: 5.6 10*12/L (ref 4.26–5.60)
RED CELL DISTRIBUTION WIDTH: 15.6 % — ABNORMAL HIGH (ref 12.2–15.2)
WBC ADJUSTED: 8 10*9/L (ref 3.6–11.2)

## 2022-02-01 LAB — HEMOGLOBIN A1C
ESTIMATED AVERAGE GLUCOSE: 235 mg/dL
HEMOGLOBIN A1C: 9.8 % — ABNORMAL HIGH (ref 4.8–5.6)

## 2022-02-01 LAB — SLIDE REVIEW

## 2022-02-01 LAB — T4, FREE: FREE T4: 0.95 ng/dL (ref 0.89–1.76)

## 2022-02-01 LAB — TSH: THYROID STIMULATING HORMONE: 21.822 u[IU]/mL — ABNORMAL HIGH (ref 0.550–4.780)

## 2022-02-01 NOTE — Unmapped (Signed)
For you diarrhea, you can consider over the counter medications like imodium.    We will have you follow up to talk more about blood sugar monitoring, especially with a continuous device.

## 2022-02-01 NOTE — Unmapped (Signed)
[facility]  PCP: Andree Coss, MD    Consulting providers:  Otolaryngology: Dr. 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 man with history of poorly controlled T2DM, hypertension, ischemic cardiac disease s/p catheterization in 2021 and metastatic thyroid cancer, s/p resection with Dr. Azucena Fallen on 04/12/20 and neck dissection on 07/28/20. Noted to have metastatic disease with pulmonary involvement on PET/CT (01/14/2022). Treating with 1L Lenvima 18 mg daily (02/21/21-current). Course has been complicated by poor compliance. He presents today in follow up on treatment.    # Metastatic Papillary Thyroid Cancer, BRAF WT  - Treatment goals are palliative.   - Tg: 75 (02/17/2020) --> 43 (06/24/2020) --> 212 (01/04/2021) --> 323 (02/01/21) ---> 502 (06/15/21) --> 662 (10/05/2021); Repeat in process  - Currently on Lenvima (02/21/21- current); notable for multiple missed doses  - CT Neck/Chest (12/21/2021): stable disease  - Labs reviewed and acceptable  - Continue Lenvima 18 mg daily; pt does not want to DR  - RTC 03/15/2022 with repeat scans    # Hidradenitis suppurativa  - Poorly controlled, seen in ED 10/9 and prescribed clindamycin gel but not covered by insurance  - No relief with a 10 day course of doxycycline prescribed at last visit  - Referred to dermatology, appointment not yet scheduled    # Hypertension, poor control  - 143/103, remains above goal today  - Current regimen includes amlodipine 10 mg daily, Lorsartan 100 mg and HCTZ 25 mg daily   - Encouraged medical compliance  - Has been referred to internal medicine clinic but not yet scheduled    # Diabetes, ongoing hyperglycemia  - Poor control but improving, A1c 14.6% (11/17/21) down to 9.8%  - Managing with metformin 1000 mg twice daily and Basaglar 20 units nightly  - We also discussed importance of compliance on insulin.  [facility] referral as above  - Patient interested in CGM but would need to take insulin 2x daily, use mealtime insulin and likely need new phone    # Hypothyroidism  - On 11/17/21 labs from [facility], TSH up to 45.84 from 34 (but overall down from 56), free T4 0.51. On 11/22 TSH 26.7, FT4 0.87. Today TSH 21.82, FT4 0.95  - Continue dose of synthroid 200 mcg once daily. Again discussed medication compliance, as better control of his thyroid function may improve fatigue.  - Goal TSH suppression to <0.1    # Supportive care  - Cancer-related pain: Continue on Gabapentin 300mg   - Psychosocial: denies any need for CCSP at this time.   - Constipation: Recommended Miralax     # Diarrhea, SOB, nausea, emesis  -Flu/COVID/RSV negative however suspect viral etiology given loose stools x3-4 days or sudden onset and high community prevalence of respiratory and GI illnesses currently. Recommended imodium and oral intake of fluids for supportive care at home. Lipase ordered and pending.     Follow up: 6 weeks    Elizabeth C. Blowers MD PhD  [facility]ology Oncology    Ginette Otto, DO, MPH  Head and Neck Medical Oncology  [facility]  --------------------------  Interval History  - No fevers, chills, abd pain, or cramps but presents with 4 days of tan colored diarrhea, nausea, and vomiting with most recent emesis last night, small volume, no blood, no sick contacts, no cough or sputum production. Denies any  hospitalization since [facility] hospitalization for drug overdose.     - Energy levels are stabily low  - Denies skin rash or mucositis  - Productive cough is stable  - Does report his hydradenitis skin abscesses have worsened. No improvement with doxycycline and he has not yet established care with dermatology  - Reports good compliance with his Lenvima and all other medications  - He has been living in [place], but is planning to move to [facility]  - Presents to clinic by himself with supportive friend/family on video    Admitted to [facility] from 10/19-10/21 for acute encephalopathy and hyperglycemia after being found down at home with a GCS of 3 and sats of 70% on RA. Patient awoke after IN and IV narcan. Glucose >700 on admission and the patient's Glargine was increased to 15u daily at discharge.    History of Present Illness:  Paul Hines is a 52 y.o. male with history of T2DM, hypertension, ischemic heart disease s/p cardiac catheterization (07/2019) who presents for evaluation of head and neck cancer. I have reviewed his records including history, imaging, pathology reports, and, when applicable, operative notes and summarized his oncologic history below:     Patient was incidentally diagnosed on workup for chest pain in 2021. CTA cardiac (07/08/2019) showed bilateral pulmonary nodules and repeat imaging from 10/2019 and 12/2019 redemonstrated nodules. PET/CT obtained (10/2019) and was notable for hypermetabolic right hilar and mediastinal lymph nodes. FNA performed on 01/09/2020 to dominant right upper pulmonary nodule and confirmed papillary thyroid carcinoma. Further biopsy of lymph nodes were negative for malignancy.      He was evaluated by Dr. Azucena Fallen in February 2022. At that time he reports intermittent dysphagia, but was not significantly bothersome and did not limit his diet. He otherwise denied palpable neck mass, voice changes, otalgia or weight loss. Pre-treatment Tg was 75 in January 2022. Patient underwent total thyroidectomy on 04/12/2020 with bilateral neck dissection on 07/28/2020. Final pathology consistent with PTC, lymph node involvement (14/14) with positive ENE. Received adjuvant RAI (200 mCi I-131) on 05/20/2020.      PET/CT (01/14/2021) concerning for numerous pulmonary nodules that had increased in size when compared to prior imaging. Otherwise, no evidence of distant disease. Tumor marker was also noted to be elevated at 212 (01/04/2021).      02/01/2021: Present unaccompanied today, feeling well until recent illness as described above. He does report residual numbness all along the underside of his face. Previously smoked 2 packs of cigarettes per day for 14 years. Currently smoking ~3 cigarettes daily. He is not currently employed, but would like to work if possible. He was recently been released from prison and is currently staying with [facility].     Past Medical History:   Diagnosis Date    Abnormal findings on dx imaging of heart and cor circ     Acne vulgaris     Allergic contact dermatitis due to food in contact with skin     Atherosclerosis     Athscl heart disease of native coronary artery w/o ang pctrs     Chronic ischemic heart disease     Cutaneous abscess     Diabetic polyneuropathy (CMS-HCC)     Disorder of skin and subcutaneous tissue     Essential hypertension     H/O medication noncompliance     Hernia, inguinal, unilateral     Hypertrophic cardiomyopathy (CMS-HCC)     Malignant neoplasm of thyroid gland (CMS-HCC)     Presbyopia  Pseudofolliculitis barbae     Shoulder pain, bilateral     Tinea unguium     Type 2 diabetes mellitus (CMS-HCC)     Xerosis cutis        Past Surgical History:   Procedure Laterality Date    PR BRNCHSC EBUS GUIDED SAMPL 1/2 NODE STATION/STRUX N/A 01/09/2020    Procedure: BRONCH, RIGID OR FLEXIBLE, INC FLUORO GUIDANCE, WHEN PERFORMED; WITH EBUS GUIDED TRANSTRACHEAL AND/OR TRANSBRONCHIAL SAMPLING, ONE OR TWO MEDIASTINAL AND/OR HILAR LYMPH NODE STATIONS OR STRUCTURES;  Surgeon: Jerelyn Charles, MD;  Location: MAIN OR [place];  Service: Pulmonary    PR BRNSCHSC TNDSC EBUS DX/TX INTERVENTION PERPH LES Right 01/09/2020    Procedure: BRONCH, RIGID OR FLEXIBLE, INCLUDING FLUORO GUIDANCE, WHEN PERFORMED; WITH TRANSENDOSCOPIC EBUS DURING BRONCHOSCOPIC DIAGNOSTIC OR THERAPEUTIC INTERVENTION(S) FOR PERIPHERAL LESION(S);  Surgeon: Jerelyn Charles, MD;  Location: MAIN OR [place];  Service: Pulmonary    PR BRONCHOSCOPY,COMPUTER ASSIST/IMAGE-GUIDED NAVIGATION Right 01/09/2020    Procedure: ROBOT ION BRONCHOSCOPY,RIGID OR FLEXIBLE,INCLUDE FLUORO WHEN PERFORMED; W/COMPUTER-ASSIST,IMAGE-GUIDED NAVIGATION;  Surgeon: Jerelyn Charles, MD;  Location: MAIN OR [place];  Service: Pulmonary    PR BRONCHOSCOPY,DIAGNOSTIC W LAVAGE Right 01/09/2020    Procedure: BRONCHOSCOPY, RIGID OR FLEXIBLE, INCLUDE FLUOROSCOPIC GUIDANCE WHEN PERFORMED; W/BRONCHIAL ALVEOLAR LAVAGE;  Surgeon: Jerelyn Charles, MD;  Location: MAIN OR [place];  Service: Pulmonary    PR BRONCHOSCOPY,TRANSBRON ASPIR BX Right 01/09/2020    Procedure: BRONCHOSCOPY, RIGID/FLEX, INCL FLUORO; W/TRANSBRONCH NDL ASPIRAT BX, TRACHEA, MAIN STEM &/OR LOBAR BRONCHUS;  Surgeon: Jerelyn Charles, MD;  Location: MAIN OR [place];  Service: Pulmonary    PR BRONCHOSCOPY,TRANSBRONCH BIOPSY Right 01/09/2020    Procedure: BRONCHOSCOPY, RIGID/FLEXIBLE, INCLUDE FLUORO GUIDANCE WHEN PERFORMED; W/TRANSBRONCHIAL LUNG BX, SINGLE LOBE;  Surgeon: Jerelyn Charles, MD;  Location: MAIN OR [place];  Service: Pulmonary    PR CATH PLACE/CORON ANGIO, IMG SUPER/INTERP,W LEFT HEART VENTRICULOGRAPHY N/A 08/26/2019    Procedure: Left Heart Catheterization;  Surgeon: Marlaine Hind, MD;  Location: [facility] CATH;  Service: Cardiology    PR LIGATN THOR DUCT,CERV APPROACH Bilateral 07/28/2020    Procedure: SUTURE &/OR LIG THORACIC DUCT; CERV APPROACH;  Surgeon: Lauralee Evener, MD;  Location: MAIN OR [facility];  Service: ENT    PR REMOVAL NODES, NECK,CERV MOD RAD Bilateral 07/28/2020    Procedure: CERVICAL LYMPHADENECTOMY (MODIFIED RADICAL NECK DISSECTION);  Surgeon: Lauralee Evener, MD;  Location: MAIN OR [facility];  Service: ENT    PR THYROIDECTOMY,MALIG,LTD NECK SURG Bilateral 04/12/2020    Procedure: THYROIDECTOMY, TOTAL OR SUBTOTAL FOR MALIGNANCY; WITH LIMITED NECK DISSECTION;  Surgeon: Lauralee Evener, MD;  Location: MAIN OR [facility];  Service: ENT       Current Outpatient Medications   Medication Sig Dispense Refill    amlodipine (NORVASC) 10 MG tablet Take 1 tablet (10 mg total) by mouth daily. 90 tablet 3    atorvastatin (LIPITOR) 80 MG tablet Take 1 tablet (80 mg total) by mouth nightly. 90 tablet 2    blood sugar diagnostic (ACCU-CHEK GUIDE TEST STRIPS) Strp Use to check blood glucose once daily. 100 strip 1    blood-glucose meter kit Test blood glucose level once daily. 1 each 0    gabapentin (NEURONTIN) 300 MG capsule Take 1 capsule (300 mg total) by mouth two (2) times a day. 180 capsule 2    hydroCHLOROthiazide (HYDRODIURIL) 25 MG tablet Take 1 tablet (25 mg total) by mouth daily. 90 tablet 1    insulin glargine (BASAGLAR, LANTUS) 100 unit/mL (3 mL) injection pen Inject  0.2 mL (20 Units total) under the skin nightly. 15 mL 0    lancets Misc Use to check blood glucose once daily. 100 each 1    lenvatinib (LENVIMA) 18 mg/day (10 mg x 1 AND 4 mg x 2) cap Take 3 capsules (18 mg) by mouth daily. 90 capsule 2    levothyroxine (SYNTHROID) 200 MCG tablet Take 1 tablet (200 mcg total) by mouth daily. 90 tablet 1    losartan (COZAAR) 100 MG tablet Take 1 tablet (100 mg total) by mouth daily. 90 tablet 1    metFORMIN (GLUCOPHAGE) 500 MG tablet Take 2 tablets (1,000 mg total) by mouth in the morning and 2 tablets (1,000 mg total) in the evening. Take with meals. 360 tablet 2    pen needle, diabetic 32 gauge x 5/32 (4 mm) Ndle Use with insulin glargine once daily 100 each 1     No current facility-administered medications for this visit.       Allergies   Allergen Reactions    Penicillin Anaphylaxis    Fish Derived     Lisinopril     Peanut Butter Flavor      Per allergy testing    Penicillins Other (See Comments)     Per patient, when he took at age 26 his legs and arms shook. Not sure if it was seizure or not.     Shellfish Containing Products     Soy        Social History     Tobacco Use    Smoking status: Former     Current packs/day: 0.00     Types: Cigarettes     Quit date: 2018     Years since quitting: 6.0    Smokeless tobacco: Never   Substance Use Topics    Drug use: Not Currently     Types: Marijuana     Comment: quit in 1992       Social History     Social History Narrative    Not on file       Family History   Family history unknown: Yes       Review of Systems: A 12-system review of systems was obtained including: Constitutional, Eyes, ENT, Cardiovascular, Respiratory, GI, GU, Musculoskeletal, Skin, Neurological, Psychiatric, Endocrine, Heme/Lymphatic, and Allergic/Immunologic systems. It is negative or non-contributory to the patient???s management except for as stated in patient's HPI    ECOG PS: 0    Physical Examination:  Vital Signs: BP 148/103  - Pulse 85  - Temp 36.7 ??C (98 ??F) (Tympanic)  - Wt 77.1 kg (170 lb)  - SpO2 98%  - BMI 26.63 kg/m??   CONSTITUTIONAL: Well appearing man in NAD  Oral Cavity: MMM, no oral lesions or mucositis  Lymphatics: No lymphadenopathy   CV: RRR; no lower extremity edema  RESP: normal work of breathing  GI: Soft, non-tender, non-distended  SKIN: No skin rashes  NEURO: no focal deficits appreciated  PSYCH: Normal mood and appropriate affect    LABS  Lab Results   Component Value Date    WBC 8.0 02/01/2022    HGB 13.8 02/01/2022    HCT 41.8 02/01/2022    PLT 263 02/01/2022       Lab Results   Component Value Date    NA 137 02/01/2022    K 3.4 02/01/2022    CL 101 02/01/2022    CO2 28.0 02/01/2022    BUN 9 02/01/2022    CREATININE 1.01 02/01/2022  GLU 171 02/01/2022    CALCIUM 9.1 02/01/2022    MG 1.6 07/31/2020    PHOS 3.0 07/31/2020       Lab Results   Component Value Date    BILITOT 0.3 02/01/2022    PROT 8.2 02/01/2022    ALBUMIN 3.6 02/01/2022    ALT 17 02/01/2022    AST 24 02/01/2022    ALKPHOS 88 02/01/2022       No results found for: PT, INR, APTT      IMAGING  CT Neck (12/21/21)  --No definite evidence of recurrent tumor in the thyroid bed. Presumed prominent jugular / scarring on the right, not adenopathy at level 3-4 on the right. Please note the exam is limited by motion, contrast bolus timing, and paucity of fat. Consider ultrasound to assess this location if clinically indicated.  --Development of right vocal cord paralysis compared to the preceding PET/CT from 09/29/2021.    CT Chest (12/21/21)  --Stable multifocal bilateral pulmonary metastasis.  --Stable sclerotic lesions involving the right posterior 12th rib and cortical sclerosis of right posterior eighth rib. No new osseous lesions.    PET/CT (09/29/21)   Diffuse background activity, most likely due to insufficient fasting prior to scanning limits evaluation.      -Numerous bilateral pulmonary nodules with apparent increase in size and probably number with the largest in the right middle lobe measuring 1.8 cm, worrisome for metastatic disease.      -Subcentimeter mildly FDG avid bilateral neck lymph nodes, concerning but not definitive for residual disease. Recommend ultrasound and FNA, as clinically indicated.      -Findings in the right inguinal region as described under Adenopathy, most compatible with improving infection. Recommend focused physical examination.      -Cystic structure with a rim of FDG uptake in the left posterolateral scalp, grossly unchanged. Felt to be benign. Recommend focused physical exam.      -Recommend radioiodine scan to evaluate for inter-lesion heterogeneity and potentially radioiodine avid tissue.       PATHOLOGY  THYROID GLAND  8th Edition - Protocol posted: 03/27/2018   THYROID GLAND: RESECTION - All Specimens  Clinical History  No known radiation exposure    SPECIMEN   Procedure  Total thyroidectomy    TUMOR   Tumor Focality  Multifocal    Tumor Characteristics     Tumor Site  Right lobe      Left lobe      Isthmus    Histologic Type  Papillary carcinoma, classic (usual, conventional)    Tumor Size  Greatest Dimension (Centimeters): 3.1 cm   Extrathyroidal Extension  Not identified    Angioinvasion (vascular invasion)  Not identified    Lymphatic Invasion  Not identified    Perineural Invasion  Not identified    Margins  Uninvolved by carcinoma    Distance of Invasive Carcinoma from Closest Margin (Millimeters)  1 mm   Mitotic Rate  4 Mitoses per 2 mm^2   LYMPH NODES   Number of Lymph Nodes Involved  14    Nodal Levels Involved  Level VI    Size of Largest Metastatic Deposit (Centimeters)  5 cm   Extranodal Extension (ENE)  Present    Number of Lymph Nodes Examined  14    Nodal Levels Examined  Level VI    PATHOLOGIC STAGE CLASSIFICATION (pTNM, AJCC 8th Edition)   TNM Descriptors  m (multiple primary tumors)         Primary Tumor (pT)  pT2  Regional Lymph Nodes (pN)  pN1a    Distant Metastasis (pM)  pM1    Site(s)  lung see [id number]      I have personally reviewed relevant imaging, laboratory values, existing medical records, and pathology. I have summarized these findings in the oncology history above.

## 2022-02-03 NOTE — Unmapped (Signed)
Clinical Pharmacist Practitioner: [facility]    Patient Name: Paul Hines  Patient Age: 52 y.o.  Encounter Date: 02/01/2022  Primary Oncologist: Ginette Otto, DO, MPH    Reason for visit: oral chemotherapy follow-up    ASSESSMENT & PLAN:  1. Metastatic PTC: patient started on Lenvima on 02/21/21 and was tolerating it well, however had been off for 1-2 months at last visit in September 2023. Treatment restarted at that time and he is overall tolerating well again although notes more fatigue but it is manageable to him and he does not want to dose reduce. Scans on 12/21/21 show SD.   Continue Lenvima 18 mg daily    Thyroglobulin, Tumor Marker, IA (ng/mL)   Date Value   10/05/2021 662 (H)   06/15/2021 502 (H)   02/01/2021 323 (H)   01/04/2021 212 (H)   06/24/2020 43 (H)     2. Diarrhea and N/V: unlikely to be 2/2 Lenvima due to acute onset and no prior h/o these symptoms over past 10 months on Lenvima. Suspect viral etiology.  Start Imodium 1-2 tabs QID PRN diarrhea; push fluids at home    3. Type 2 diabetes: continues on metformin and basal insulin. Not checking BG at home and requests CGM system, however Medicaid will not cover unless patient is on two or more insulin injections per day. Will refer to IM clinic for further mgmt. Hgb A1c improved to 9.8% today.  Continue Basaglar 20 units qhs and metformin 1000 mg BID  Encouraged testing BG at least once daily and recording values to bring to follow up appointments    4. Postsurgical hypothyroidism: goal for TSH suppression (<0.1). TSH today improved to 21 (from 27) and free T4 now WNL. Levothyroxine restarted on 10/05/21. Will repeat thyroid labs at next visit  Continue levothyroxine 200 mcg daily    5. HTN: clinic BP improved to 148/103 today after adding amlodipine. Not checking BP at home. Has been back on antihypertensives since 10/05/21.  Continue amlodipine 10 mg daily, losartan 100 mg daily and hydrochlorothiazide 25 mg daily     6. Hidradenitis suppurativa: seen in ED on 11/07/21, prescribed ClindaGel but this is not covered by Medicaid so prescribed doxy x 10 days with no improvement.   Appt w/ dermatology on 02/15/22    7. Peripheral neuropathy: present in hands, likely 2/2 T2DM. Stable since last visit.  Continue gabapentin 300 mg BID    8. H/o ischemic cardiac disease s/p catheterization: refer to IM clinic. Lipid panel on 10/05/21 elevated with cholesterol 239, HDL 100, LDL 115, non HDL 295.   Continue atorvastatin 80 mg daily    F/U: RTC in 6 weeks    ______________________________________________________________________    HPI: Paul Hines is a 52 y.o. male with history of T2DM, hypertension, ischemic cardiac disease s/p catheterization in 2021 and metastatic thyroid cancer, s/p resection with Dr. Azucena Fallen on 04/12/20 and neck dissection on 07/28/20. Started Lenvima on 02/21/21 although has had issues with noncompliance.     Oral chemotherapy: Lenvima 18 mg daily  Start date: 02/21/21   Specialty pharmacy: [facility]  Copay: $0    Interim History:   - reports diarrhea and N/V over past 3-4 days  - no fevers, chills, abdominal pain, cramping  - energy stabily low  - no new side effects from Lenvima  - concerned about ongoing HS abscesses    Adherence: confirms taking appropriately, rarely misses doses since restarting in September  Drug-Drug Interactions:  none per current med list    Oncology History:  Oncology History   Thyroid cancer (CMS-HCC)   03/09/2020 Initial Diagnosis    Thyroid cancer (CMS-HCC)     07/29/2021 -  Cancer Staged    Staging form: Thyroid - Differentiated, AJCC 8th Edition  - Clinical: Stage II (cM1, Age at diagnosis: < 55 years) - Signed by Dionicia Abler, DO on 07/29/2021           Problem List:   Patient Active Problem List   Diagnosis    Abnormal computed tomography angiography (CTA)    Thyroid cancer (CMS-HCC)    Obesity    Right inguinal hernia    Diabetes (CMS-HCC)    Essential hypertension Medications:  Current Outpatient Medications   Medication Sig Dispense Refill    amlodipine (NORVASC) 10 MG tablet Take 1 tablet (10 mg total) by mouth daily. 90 tablet 3    atorvastatin (LIPITOR) 80 MG tablet Take 1 tablet (80 mg total) by mouth nightly. 90 tablet 2    blood sugar diagnostic (ACCU-CHEK GUIDE TEST STRIPS) Strp Use to check blood glucose once daily. 100 strip 1    blood-glucose meter kit Test blood glucose level once daily. 1 each 0    gabapentin (NEURONTIN) 300 MG capsule Take 1 capsule (300 mg total) by mouth two (2) times a day. 180 capsule 2    hydroCHLOROthiazide (HYDRODIURIL) 25 MG tablet Take 1 tablet (25 mg total) by mouth daily. 90 tablet 1    insulin glargine (BASAGLAR, LANTUS) 100 unit/mL (3 mL) injection pen Inject 0.2 mL (20 Units total) under the skin nightly. 15 mL 0    lancets Misc Use to check blood glucose once daily. 100 each 1    lenvatinib (LENVIMA) 18 mg/day (10 mg x 1 AND 4 mg x 2) cap Take 3 capsules (18 mg) by mouth daily. 90 capsule 2    levothyroxine (SYNTHROID) 200 MCG tablet Take 1 tablet (200 mcg total) by mouth daily. 90 tablet 1    losartan (COZAAR) 100 MG tablet Take 1 tablet (100 mg total) by mouth daily. 90 tablet 1    metFORMIN (GLUCOPHAGE) 500 MG tablet Take 2 tablets (1,000 mg total) by mouth in the morning and 2 tablets (1,000 mg total) in the evening. Take with meals. 360 tablet 2    pen needle, diabetic 32 gauge x 5/32 (4 mm) Ndle Use with insulin glargine once daily 100 each 1     No current facility-administered medications for this visit.       Allergies:  Allergies   Allergen Reactions    Penicillin Anaphylaxis    Fish Derived     Lisinopril     Peanut Butter Flavor      Per allergy testing    Penicillins Other (See Comments)     Per patient, when he took at age 35 his legs and arms shook. Not sure if it was seizure or not.     Shellfish Containing Products     Soy        Personal and Social History:   Social History     Tobacco Use    Smoking status: Former     Current packs/day: 0.00     Types: Cigarettes     Quit date: 2018     Years since quitting: 6.0    Smokeless tobacco: Never   Substance Use Topics    Alcohol use: Not on file   He reports that he does  not currently use drugs after having used the following drugs: Marijuana.    Family History:  His Family history is unknown by patient.    Review of Systems: A complete review of systems was obtained including: Constitutional, Eyes, ENT, Cardiovascular, Respiratory, GI, GU, Musculoskeletal, Skin, Neurological, Psychiatric, Endocrine, Heme/Lymphatic, and Allergic/Immunologic systems. All other systems reviewed and are negative to the patient???s management except for what was mentioned in the interim history.     Vital Signs: There were no vitals taken for this visit.    Laboratory Data:  Office Visit on 02/01/2022   Component Date Value    Hemoglobin A1C 02/01/2022 9.8 (H)     Estimated Average Glucose 02/01/2022 235    Lab on 02/01/2022   Component Date Value    Sodium 02/01/2022 137     Potassium 02/01/2022 3.4     Chloride 02/01/2022 101     CO2 02/01/2022 28.0     Anion Gap 02/01/2022 8     BUN 02/01/2022 9     Creatinine 02/01/2022 1.01     BUN/Creatinine Ratio 02/01/2022 9     eGFR CKD-EPI (2021) Male 02/01/2022 89     Glucose 02/01/2022 171     Calcium 02/01/2022 9.1     Albumin 02/01/2022 3.6     Total Protein 02/01/2022 8.2     Total Bilirubin 02/01/2022 0.3     AST 02/01/2022 24     ALT 02/01/2022 17     Alkaline Phosphatase 02/01/2022 88     Free T4 02/01/2022 0.95     TSH 02/01/2022 21.822 (H)     WBC 02/01/2022 8.0     RBC 02/01/2022 5.60     HGB 02/01/2022 13.8     HCT 02/01/2022 41.8     MCV 02/01/2022 74.7 (L)     MCH 02/01/2022 24.6 (L)     MCHC 02/01/2022 32.9     RDW 02/01/2022 15.6 (H)     MPV 02/01/2022 8.5     Platelet 02/01/2022 263     Neutrophils % 02/01/2022 78.7     Lymphocytes % 02/01/2022 13.5     Monocytes % 02/01/2022 6.9     Eosinophils % 02/01/2022 0.7     Basophils % 02/01/2022 0.2     Absolute Neutrophils 02/01/2022 6.3     Absolute Lymphocytes 02/01/2022 1.1     Absolute Monocytes 02/01/2022 0.6     Absolute Eosinophils 02/01/2022 0.1     Absolute Basophils 02/01/2022 0.0     Microcytosis 02/01/2022 Slight (A)     Hypochromasia 02/01/2022 Marked (A)     Smear Review Comments 02/01/2022 See Comment (A)     SARS-CoV-2 PCR 02/01/2022 Negative     Influenza A 02/01/2022 Negative     Influenza B 02/01/2022 Negative     RSV 02/01/2022 Negative         I spent 15 minutes with Mr.Gosselin in direct patient care.    Bobby Rumpf, PharmD, BCOP, CPP  Clinical Pharmacist Practitioner, [facility]

## 2022-02-09 NOTE — Unmapped (Signed)
Per patient preference, text messaged patient the appointments details of the upcoming 1/17 dermatology consultation.     Spoke with [facility] regarding referral and their staff advised the patient to call their office to schedule establish care appointment. Also texted patient this information, and encouraged him to call [phone]. Patient confirmed understanding and will self-schedule his Medicaid transport for these appointments.

## 2022-02-14 NOTE — Unmapped (Signed)
[facility] Refill Coordination Note    Specialty Medication(s) to be Shipped:   Hematology/Oncology: lenvima    Other medication(s) to be shipped:  hydrochlorothiazide,Losartan ,acc-chek test strios and lancets, atorvastatin ,levothyroxine,metformin ,pen tips needles     Paul Hines, DOB: 1970-08-23  Phone: There are no phone numbers on file.      All above HIPAA information was verified with patient.     Was a Nurse, learning disability used for this call? No    Completed refill call assessment today to schedule patient's medication shipment from the [facility] [phone]).  All relevant notes have been reviewed.     Specialty medication(s) and dose(s) confirmed: Regimen is correct and unchanged.   Changes to medications: [place] reports no changes at this time.  Changes to insurance: No  New side effects reported not previously addressed with a pharmacist or physician: None reported  Questions for the pharmacist: No    Confirmed patient received a Conservation officer, historic buildings and a Surveyor, mining with first shipment. The patient will receive a drug information handout for each medication shipped and additional FDA Medication Guides as required.       DISEASE/MEDICATION-SPECIFIC INFORMATION        N/A    SPECIALTY MEDICATION ADHERENCE     Medication Adherence    Patient reported X missed doses in the last month: 0  Specialty Medication: LENVIMA 18 mg/day (10 mg x 1 AND 4 mg x 2) Cap (lenvatinib)  Patient is on additional specialty medications: No  Patient is on more than two specialty medications: No  Any gaps in refill history greater than 2 weeks in the last 3 months: no  Demonstrates understanding of importance of adherence: yes  Informant: patient  Reliability of informant: reliable  Provider-estimated medication adherence level: good  Patient is at risk for Non-Adherence: No  Reasons for non-adherence: no problems identified                  Confirmed plan for next specialty medication refill: delivery by pharmacy  Refills needed for supportive medications: not needed          Refill Coordination    Has the Patients' Contact Information Changed: No  Is the Shipping Address Different: No         Were doses missed due to medication being on hold? No    Lenvima 18 mg: 2 days of medicine on hand       REFERRAL TO PHARMACIST     Referral to the pharmacist: Not needed      [facility]     Shipping address confirmed in Epic.     Delivery Scheduled: Yes, Expected medication delivery date: 01/18.     Medication will be delivered via Same Day Courier to the prescription address in Epic WAM.    Antonietta Barcelona   [facility] Specialty Technician

## 2022-02-16 NOTE — Unmapped (Signed)
Lu Duffel 's Lenvima shipment will be delayed as a result of cost exceeds maximum override pending with insurance. Ticket #[id number] and should take less than 24 hrs.     I have reached out to the patient  at [phone] and communicated the delay. We will reschedule the medication for the delivery date that the patient agreed upon.  We have confirmed the delivery date as 02/17/22, via same day courier.

## 2022-02-17 MED FILL — ACCU-CHEK GUIDE TEST STRIPS: 100 days supply | Qty: 100 | Fill #1

## 2022-02-17 MED FILL — ACCU-CHEK SOFTCLIX LANCETS: 100 days supply | Qty: 100 | Fill #1

## 2022-02-17 MED FILL — METFORMIN 500 MG TABLET: ORAL | 90 days supply | Qty: 360 | Fill #1

## 2022-02-17 MED FILL — ATORVASTATIN 80 MG TABLET: ORAL | 90 days supply | Qty: 90 | Fill #1

## 2022-02-17 MED FILL — LEVOTHYROXINE 200 MCG TABLET: ORAL | 90 days supply | Qty: 90 | Fill #1

## 2022-02-17 MED FILL — HYDROCHLOROTHIAZIDE 25 MG TABLET: ORAL | 90 days supply | Qty: 90 | Fill #1

## 2022-02-17 MED FILL — LOSARTAN 100 MG TABLET: ORAL | 90 days supply | Qty: 90 | Fill #1

## 2022-02-17 MED FILL — TRUEPLUS PEN NEEDLE 32 GAUGE X 5/32" (4 MM): 90 days supply | Qty: 100 | Fill #1

## 2022-02-24 NOTE — Unmapped (Signed)
Patient Paul Hines    [name] called to ask for clinic name and address for upcoming appointment to arrange his ride. Also needed to notify us that his phone number changed. Removed former phone number and updated mobile number in our system. Requested to be connected with Child psychotherapist. Was formerly with Child psychotherapist, Probation officer. Will reach out to social work team to be re-assigned. No other needs at this time. Plans to stop in after his 2/14 appointment.     [name], [name], RN, [facility]   She/Her/Hers  Oncology Nurse Navigator - [name] [facility]  [facility]  [address] [place], [place] [address]    Center: [phone]  Voicemail: [phone]  [email or web address]

## 2022-02-28 NOTE — Unmapped (Signed)
See previous note

## 2022-02-28 NOTE — Unmapped (Signed)
Outpatient Oncology Social Work  Follow Up       Psychosocial & Case Management Update: SW spoke to the patient over the phone. SW introduced myself and informed him that I will be working with him. The patient stated that he is in need of affordable housing but has difficulty completing applications. SW obtained some information from him and researched community agencies that will help individuals with their needs. SW assisted the patient with calling [employer] center which serves his area.  The patient says he utilizes [employer] and knows how to call to set up his appointments. The patient gets disability.     The patient has SW information and will call with any questions/concerns. SW will remain available to provide additional support, information, and resources as needed.      Karee Christopherson H. Mayford Knife, MSW, LCSW  Oncology Outpatient Social Worker  [phone]

## 2022-02-28 NOTE — Unmapped (Signed)
Outpatient Oncology Social Work   Initial Clinical Assessment      Referral: [name], RN    Patient Status: The patient is a 52 yo male with Thyroid Cancer. The patient called in and asked to be re-established with a Child psychotherapist. SW called the patient but was unsuccessful. SW left a message for a return phone call back. SW will set a reminder to call back the next business day if the patient does not call back.     Financial/Insurance: [facility] [place] [employer]            Stanely Sexson H. Mayford Knife, MSW, LCSW  Oncology Outpatient Social Worker  [phone]

## 2022-03-01 ENCOUNTER — Ambulatory Visit: Admit: 2022-03-01 | Discharge: 2022-03-02 | Payer: PRIVATE HEALTH INSURANCE

## 2022-03-01 DIAGNOSIS — L732 Hidradenitis suppurativa: Principal | ICD-10-CM

## 2022-03-01 DIAGNOSIS — C73 Malignant neoplasm of thyroid gland: Principal | ICD-10-CM

## 2022-03-01 DIAGNOSIS — L0231 Cutaneous abscess of buttock: Principal | ICD-10-CM

## 2022-03-01 MED ORDER — DOXYCYCLINE HYCLATE 100 MG TABLET
ORAL_TABLET | Freq: Two times a day (BID) | ORAL | 0 refills | 84.00000 days | Status: CP
Start: 2022-03-01 — End: 2022-05-24
  Filled 2022-03-08: qty 60, 30d supply, fill #0

## 2022-03-01 NOTE — Unmapped (Addendum)
---> You can use Hibiclens in the shower daily     You are welcome to join the [facility] for HS of the [employer] Support group online at [email or web address] or in-person at our regular meetings.  You can textHS to [phone] for meeting reminders or join the group online for regular updates.  This can be a great opportunity to interact and learn from other patients and help work with the HS community.  We hope to see your there!    Hidradentis Suppurativa (pronounced ???high-drad-en-eye-tis/sup-your-uh-tee-vah???) is a chronic disease of hair follicles.  The lesions occur most commonly on areas of skin-to-skin contact: under the arms (axillary area), in the groin, around the buttocks, in the region around the anus and genitals, and on the skin between and under the breasts. In women, the underarms, groin, and breast areas are most commonly affected. Men most often have HS lesions on the buttocks and under the arms and may also have HS at the back of the neck and behind and around the ears.    What does HS look and feel like?   The first thing that someone with HS notices is a tender, raised, red bump that looks like an under-the-skin pimple or boil. Sometimes HS lesions have two or more ???heads.???  In mild disease only an occasional boil or abscess may occur, but in more active disease there can be many new lesions every month.  Some abscesses can become larger and may open and drain pus.  Bleeding and increased odor can also occur. In severe disease, deeper abscesses develop and may connect with each other under the skin to form tunnel-like tracts (sinuses, fistulas).  These may drain constantly, or may temporarily improve and then usually begin draining again over time.  In people who have had sinus tracts for some time, scars form that feel like ropes under the skin. In the very worst cases, networks of sinus tracts can form deeper in the body, including the muscle and other tissues. Many people with severe HS have scars that can limit their ability to freely move their arms or legs, though this is very unlikely for most patients.     Clinicians usually classify or ???grade??? HS using the [facility] staging system according to the severity of the disease for each body location:   [place] stage I: one or more abscesses are present, but no sinus tracts have formed and no scars have developed   Doreene Adas stage II: one or more abscesses are present that resolve and recur; on sinus tract can be present and scarring is seen   Doreene Adas stage III: many abscesses and more than one sinus tract is present with extensive scars.    What causes HS?  The cause of HS is not completely understood.  It seems to be a disorder of hair follicles and often many family members are affected so genetics probably play a strong role.  Bacteria are often present and may make the disease worse, but infection does not seem to be the main cause. Hormones are also likely play a role since the condition typically starts around puberty when hair follicles under the arms and in the groin start to change.  It can sometimes flare with menstrual cycles in women as well.  In most cases it lasts for decades and starts to improve to some extent in the late 30s and 40s as long as many fistulas have not already formed.  Women are three times more likely than men to  develop HS.    Other factors are known to contribute to HS flaring or becoming worse, though they are likely not the main causes. The factors most commonly associated with HS include:   Cigarette smoking - Stopping smoking will likely not cure the disease, but likely is helpful in reducing how much and how often it flares and may prevent it from getting as bad over time.   Higher weight - HS may occur even in people that are not overweight, but it is much more common in patients that are.  There is some evidence that losing weight and eating a diet low in sugars and fats may be helpful in improving hidradenitis, though this is not helpful for everyone.  Working with a nutritionist may be an important way to help with this and is something your physician can help coordinate    Hidradenitis is not contagious.  It is not caused by a problem with personal hygiene or any other activity or behavior of those with the disease.    How can your doctor help you treat your hidradenitis?  Clinicians use both medication and surgery to treat HS. The choice of treatment--or combination of treatments--is made according to an individual patient???s needs. Clinicians consider several factors in determining the most appropriate plan for therapy:   Severity of disease - medications and some laser treatments are usually able to control disease best when fistulas are not present.  Fistulas typically require surgery.   Extent and location of disease   Chronicity (how often the lesions recur)    A number of different surgical methods have been developed that are useful for certain patients under particular circumstances. These can be done with local numbing and healing at home for some areas when disease is not too extensive with relatively brief recovery times.  In more extensive disease there may be a need for larger excisions under general anesthesia with healing time in the hospital and prolonged recovery periods for better disease control.      In addition, many medical treatments have been tried--some with more success than others. No medication is effective for all patients, and you and your doctor may have to try several different treatments or combinations of treatments before you find the treatment plan that works best for you.  The goals of therapy with medications that are either topical (used on the skin) or systemic (taken by mouth) are:  1. to clear the lesions or at least reduce their number and extent, and  2. to prevent new lesions from forming.  3. To reduce pain, drainage, and odor  Some of the types of medications commonly used are antibacterial skin washes and the topical antibiotics to prevent secondary infections and corticosteroid injections into the lesions to reduce inflammation.     Other medications that may be used include retinoids (similar to Accutane), drugs that effect how hormones and hair follicles interact, drugs that affect your immune system (such as methotrexate, adalimumab/Humira, and Remicaid/infliximab), steroids, and oral antibiotics.    Lasers that destroy hair follicles can also be helpful since they reduce the hair follicles that cause the problems.  Multiple treatments are typically required over time and there is some discomfort associated with treatment, but it is typically very fast and well-tolerated.    It is very important to realize that hidradenitis cannot usually be completely cured with any single medication or surgical procedure.  It is a disease that can be very stubborn and difficult to control, but  with good treatment a lot of improvement and sometimes temporary remissions can be obtained. Poorly controlled disease can cause more fistulas to form and make managing the disease much more difficult over time so it is important to seek care to reduce major flares.  Surgery can provide a long term cure in some areas, though the disease can start again or continue in nearby areas.  A dermatologist is often the best person to help coordinate disease treatment, and sometimes other surgeons, pain specialists, other specialists, and nutritionists may be part of the treatment team.    For severe disease, the first goal is often to reduce pain and symptoms with medicines so that the disease feels more stable. Once it's stable, we often start thinking about how to address areas that have completely gotten better with surgery if they are still causing problems.    What can you do to help your HS?  1. Stopping smoking is hard and may not fix everything, but it may be a step in the right direction.  We or your primary care physician can provide resources to help stop if you are interested.  2. Follow a healthy diet and try to achieve a healthy weight.  Some other self-help measures are:   Keep your skin cool and dry (becoming overheated and sweating can contribute to an HS flare)   To reduce the pain of cysts or nodules or to help them to drain, apply hot compresses or soak in hot water for 10 minutes at a time (use a clean washcloth or a teabag soaked in hot water)   For male patients, cotton underwear that does not have tight elastic in the groin can be helpful.  Boyshort, brief, or boxer style underwear may be a better option as friction on hair follicles in affected areas can be a major trigger in some patients.  These can be easily found on [place] or with some retailers.  Fruit of the Loom and Underworks are two brands that are sometimes recommended.    Finally, know that you are not alone. Coping with the pain and other symptoms of HS can be very difficult, so it may be helpful to connect with others who live with HS. Patient groups and networks can be sources of important information and support. Some internet resources for information and connections are provided below.    Psychologytoday.com is a resource to find psychologists and therapists that can help support you in your are     [email or web address] can help connect with sexual health resources and counselors    Resources for Information    The Hidradenitis Suppurativa Foundation: A nonprofit organized by a group of physicians interested in treating and advancing research in hidradenitis suppurativa.  This group advocates for better care and research for hidradenitis and has educational materials put together specifically for patients that have been reviewed and produced by doctors and people with hidradenitis.    American Academy of Dermatology  ARanked.fi    [employer]  ElevatorP[employer]chers.de.html  [place]: [employer] for [employer]  https://www.rarediseases.org/rare-disease-information/rare-diseases/byID/358/viewAbstract  Trials of new medications for HS  Https://www.clinicaltrials.gov

## 2022-03-01 NOTE — Unmapped (Signed)
Dermatology Note     Assessment and Plan:    Hidradenitis suppurativa, severe, Hurley III, chronic, flaring  Current stage 4 metastatic thyroid cancer being treated with lenvatinib   - We discussed the typical natural history, pathogenesis, treatment options, and expected course as well as the relapsing and sometimes recalcitrant nature of the disease and potential for sinus tract formation and scarring.    - We discussed associated comorbid conditions (pilonidal cysts, IBD, psoriasis, acne, dissecting cellulitis)  - Discussed that combination of medical and surgical therapy is often required for treatment.   - Reviewed association between HS and tobacco use; encouraged cessation  - Reviewed role of antibiotics in HS to decrease inflammation   - Start doxycycline 100 mg BID for 12 weeks       > Discussed potential side effects including sun sensitivity, allergic reaction, GI upset including esophagitis and diarrhea and to stop the medication and call if these should occur.   - Discussed that the medication he is on for his metastatic thyroid cancer (lenvatinib) can sometimes cause lesions to flare  - Discussed may have some limited or higher risk treatment options given active cancer- will reach out to referring oncologist Dr. Ginette Otto Hines to coordinate treatment options  - EPIC message sent  - Start Hibiclens wash qday in shower  - Patient has a history of uncontrolled diabetes mellitus (most recent A1C 9.8), encouraged glycemic control as this can also help with disease activity     The patient was advised to call for an appointment should any new, changing, or symptomatic lesions develop.     RTC: Return in about 3 months (around 05/30/2022). or sooner as needed   _________________________________________________________________      Chief Complaint     Chief Complaint   Patient presents with    Skin Problem     Referred for HS, pt report it has been an issue for years, not currently treating and experiencing constant flares and drainage involved in the groin, no fever but experiencing pain        HPI     Paul Hines is a 52 y.o. male who presents as a new patient to Dermatology for  HS.     - Patient has had boils in his groin for many months, also in buttocks  - Nothing in the underarms  - Has been treated with short course of doxycycline and clindamycin in the past but did not help  - Currently being treated for stage 4 metastatic thyroid cancer    The patient denies any other new or changing lesions or areas of concern.     Past Medical History, Family History, Social History, Medication List, Allergies, and Problem List were reviewed in the rooming section of Epic.     ROS: Other than symptoms mentioned in the HPI, no fevers, chills, or other skin complaints    Physical Examination     GENERAL: Well-appearing male in no acute distress, resting comfortably.  NEURO: Alert and oriented, answers questions appropriately  PSYCH: Normal mood and affect  RESP: No increased work of breathing  SKIN (Full Skin Exam): Examination of the face, eyelids, lips, nose, ears, neck, chest, abdomen, back, arms, legs, hands, feet, palms, soles, nails was performed  - Extensive boils, drainage, scarring in the inguinal area and buttocks     All areas not commented on are within normal limits or unremarkable    (Approved Template 10/13/2019)

## 2022-03-13 NOTE — Unmapped (Signed)
Patient [facility] Cancer Support Encounter    Mr. Westover called the Patient & [facility]. Provided active listening and emotional support as patient discussed current needs and challenges.    Said he had tried to call and arrange a ride for his upcoming appointments, but he has been unable to get a hold of anyone. Verified the number for [employer] for [facility] [phone]). Mr. Whisenhunt was calling a different number. York Spaniel he was going to try and call the number I gave him to see if he can arrange. Told him that if he has any issues, to call me back.     Confirmed upcoming appointments with Mr. Milton. He is aware that he needs to come to main campus after his imaging appointment.     No other needs at this time. Mr. Uva has my direct callback number if I can be of any assistance.     [name], [name], RN, [facility]   She/Her/Hers  Oncology Nurse Navigator - [name] [facility]  [facility]  [address], [place], [place] [address]    Center: [phone]  Voicemail: [phone]  [email or web address]

## 2022-03-15 ENCOUNTER — Ambulatory Visit: Admit: 2022-03-15 | Discharge: 2022-03-15 | Payer: PRIVATE HEALTH INSURANCE

## 2022-03-15 ENCOUNTER — Ambulatory Visit
Admit: 2022-03-15 | Discharge: 2022-03-15 | Payer: PRIVATE HEALTH INSURANCE | Attending: Student in an Organized Health Care Education/Training Program | Primary: Student in an Organized Health Care Education/Training Program

## 2022-03-15 ENCOUNTER — Ambulatory Visit: Admit: 2022-03-15 | Discharge: 2022-03-15 | Payer: PRIVATE HEALTH INSURANCE | Attending: Oncology | Primary: Oncology

## 2022-03-15 DIAGNOSIS — C73 Malignant neoplasm of thyroid gland: Principal | ICD-10-CM

## 2022-03-15 LAB — CBC W/ AUTO DIFF
BASOPHILS ABSOLUTE COUNT: 0 10*9/L (ref 0.0–0.1)
BASOPHILS RELATIVE PERCENT: 0.3 %
EOSINOPHILS ABSOLUTE COUNT: 0.1 10*9/L (ref 0.0–0.5)
EOSINOPHILS RELATIVE PERCENT: 1.8 %
HEMATOCRIT: 42.3 % (ref 39.0–48.0)
HEMOGLOBIN: 13.6 g/dL (ref 12.9–16.5)
LYMPHOCYTES ABSOLUTE COUNT: 1.1 10*9/L (ref 1.1–3.6)
LYMPHOCYTES RELATIVE PERCENT: 13.1 %
MEAN CORPUSCULAR HEMOGLOBIN CONC: 32.1 g/dL (ref 32.0–36.0)
MEAN CORPUSCULAR HEMOGLOBIN: 23.9 pg — ABNORMAL LOW (ref 25.9–32.4)
MEAN CORPUSCULAR VOLUME: 74.4 fL — ABNORMAL LOW (ref 77.6–95.7)
MEAN PLATELET VOLUME: 8.8 fL (ref 6.8–10.7)
MONOCYTES ABSOLUTE COUNT: 0.4 10*9/L (ref 0.3–0.8)
MONOCYTES RELATIVE PERCENT: 4.5 %
NEUTROPHILS ABSOLUTE COUNT: 6.8 10*9/L (ref 1.8–7.8)
NEUTROPHILS RELATIVE PERCENT: 80.3 %
PLATELET COUNT: 306 10*9/L (ref 150–450)
RED BLOOD CELL COUNT: 5.69 10*12/L — ABNORMAL HIGH (ref 4.26–5.60)
RED CELL DISTRIBUTION WIDTH: 15.9 % — ABNORMAL HIGH (ref 12.2–15.2)
WBC ADJUSTED: 8.4 10*9/L (ref 3.6–11.2)

## 2022-03-15 LAB — COMPREHENSIVE METABOLIC PANEL
ALBUMIN: 3.1 g/dL — ABNORMAL LOW (ref 3.4–5.0)
ALKALINE PHOSPHATASE: 93 U/L (ref 46–116)
ALT (SGPT): 12 U/L (ref 10–49)
ANION GAP: 3 mmol/L — ABNORMAL LOW (ref 5–14)
AST (SGOT): 25 U/L (ref <=34)
BILIRUBIN TOTAL: 0.4 mg/dL (ref 0.3–1.2)
BLOOD UREA NITROGEN: 9 mg/dL (ref 9–23)
BUN / CREAT RATIO: 9
CALCIUM: 8.7 mg/dL (ref 8.7–10.4)
CHLORIDE: 103 mmol/L (ref 98–107)
CO2: 31 mmol/L (ref 20.0–31.0)
CREATININE: 1.05 mg/dL
EGFR CKD-EPI (2021) MALE: 84 mL/min/{1.73_m2} (ref >=60)
GLUCOSE RANDOM: 239 mg/dL — ABNORMAL HIGH (ref 70–179)
POTASSIUM: 3.5 mmol/L (ref 3.4–4.8)
PROTEIN TOTAL: 7.9 g/dL (ref 5.7–8.2)
SODIUM: 137 mmol/L (ref 135–145)

## 2022-03-15 LAB — T4, FREE: FREE T4: 0.52 ng/dL — ABNORMAL LOW (ref 0.89–1.76)

## 2022-03-15 LAB — SLIDE REVIEW

## 2022-03-15 LAB — TSH: THYROID STIMULATING HORMONE: 45.544 u[IU]/mL — ABNORMAL HIGH (ref 0.550–4.780)

## 2022-03-15 MED ORDER — TRAMADOL 50 MG TABLET
ORAL_TABLET | Freq: Four times a day (QID) | ORAL | 0 refills | 5 days | Status: CP | PRN
Start: 2022-03-15 — End: 2023-03-15
  Filled 2022-03-15: qty 20, 5d supply, fill #0

## 2022-03-15 MED ADMIN — iohexol (OMNIPAQUE) 350 mg iodine/mL solution 75 mL: 75 mL | INTRAVENOUS | @ 15:00:00 | Stop: 2022-03-15

## 2022-03-15 NOTE — Unmapped (Signed)
Pt arrived from CT with an IV in the LAC. Per Dr Marlynn Perking verbal order, the  IV catheter was removed intact. I held pressure for 90 seconds then wrapped with coban. Pt tolerated well.

## 2022-03-15 NOTE — Unmapped (Signed)
[facility]  PCP: Andree Coss, MD    Consulting providers:  Otolaryngology: Dr. 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 man with history of poorly controlled T2DM, hypertension, ischemic cardiac disease s/p catheterization in 2021 and metastatic thyroid cancer, s/p resection with Dr. Azucena Fallen on 04/12/20 and neck dissection on 07/28/20. Noted to have metastatic disease with pulmonary involvement on PET/CT (01/14/2022). Treating with 1L Lenvima 18 mg daily (02/21/21-current). Course has been complicated by poor compliance. He presents today in follow up on treatment to review scans.    # Metastatic Papillary Thyroid Cancer, BRAF WT  - Treatment goals are palliative.   - Tg: 75 (02/17/2020) --> 43 (06/24/2020) --> 212 (01/04/2021) --> 323 (02/01/21) ---> 502 (06/15/21) --> 662 (10/05/2021) --> 70 (02/01/2022) --> 107 (03/15/2022)  - Currently on Lenvima (02/21/21- current); notable for multiple missed doses  - CT Neck/Chest (03/15/2022): stable disease  - Continue Lenvima 18 mg daily  - Labs reviewed and acceptable  - We discussed the possibility of targeted RT to the sclerotic lesions within the right posterior 8th and 12th ribs; will reassess his pain level at next visit    # Hidradenitis suppurativa  - Poorly controlled, seen in ED 10/9 and prescribed clindamycin gel but not covered by insurance  - Followed by dermatology (Dr. Hayes Ludwig treated with 12 week course of doxycycline 100 mg BID + Hibiclens washes in the shower    # Hypertension, poor control  - 166/98, remains above goal today  - Current regimen includes amlodipine 10 mg daily, Lorsartan 100 mg and HCTZ 25 mg daily   - Encouraged medical compliance and frequent home checks  - Has been referred to [facility]    # Diabetes, ongoing hyperglycemia  - Poor control but improving, A1c 14.6% (11/17/21) down to 9.8% (02/01/22)  - Managing with metformin 1000 mg twice daily and Basaglar 20 units nightly  - We also discussed importance of compliance on insulin as well as frequent blood sugar checks  - [facility] referral as above    # Hypothyroidism,  -  TSH 45.4, FT4 0.52 (03/15/2022)  - Poor control due to lack of medication compliance  - Goal TSH <0.1  -  Continue synthroid 200 mcg once daily.    # Supportive care  - Cancer-related pain: Continue on Gabapentin 300mg  + Tramadol 50 mg q6hrs prn (script given today)  - Psychosocial: denies any need for CCSP at this time.   - Constipation: Recommended Miralax     Follow up: 6 weeks    Ginette Otto, DO, MPH  Head and Neck Medical Oncology  [facility]  --------------------------  Interval History  - Feeling tired  - He established care with dermatology two weeks ago and was prescribed a 12 week course of doxycycline for his HS. He has not yet noticed a significant relief  - Denies any other symptoms aside from fatigue as well as pain to his right flank and upper chest. This pain is intermittent, but can last for several days at a time. He has been taking tylenol and ibuprofen with no relief  - The patient denies fevers, chills, abd pain, diarrhea, cramps, rash, or mucositis  - Has not been checking his blood sugars or blood pressures; continues to struggle with medication compliance  - Reports ~2 missed doses of lenvima per month  -  He continues to live in [place] and presents to clinic by himself    History of Present Illness:  Paul Hines is a 52 y.o. male with history of T2DM, hypertension, ischemic heart disease s/p cardiac catheterization (07/2019) who presents for evaluation of head and neck cancer. I have reviewed his records including history, imaging, pathology reports, and, when applicable, operative notes and summarized his oncologic history below:     Patient was incidentally diagnosed on workup for chest pain in 2021. CTA cardiac (07/08/2019) showed bilateral pulmonary nodules and repeat imaging from 10/2019 and 12/2019 redemonstrated nodules. PET/CT obtained (10/2019) and was notable for hypermetabolic right hilar and mediastinal lymph nodes. FNA performed on 01/09/2020 to dominant right upper pulmonary nodule and confirmed papillary thyroid carcinoma. Further biopsy of lymph nodes were negative for malignancy.      He was evaluated by Dr. Azucena Fallen in February 2022. At that time he reports intermittent dysphagia, but was not significantly bothersome and did not limit his diet. He otherwise denied palpable neck mass, voice changes, otalgia or weight loss. Pre-treatment Tg was 75 in January 2022. Patient underwent total thyroidectomy on 04/12/2020 with bilateral neck dissection on 07/28/2020. Final pathology consistent with PTC, lymph node involvement (14/14) with positive ENE. Received adjuvant RAI (200 mCi I-131) on 05/20/2020.      PET/CT (01/14/2021) concerning for numerous pulmonary nodules that had increased in size when compared to prior imaging. Otherwise, no evidence of distant disease. Tumor marker was also noted to be elevated at 212 (01/04/2021).      02/01/2021: Present unaccompanied today, feeling well until recent illness as described above. He does report residual numbness all along the underside of his face. Previously smoked 2 packs of cigarettes per day for 14 years. Currently smoking ~3 cigarettes daily. He is not currently employed, but would like to work if possible. He was recently been released from prison and is currently staying with [facility].     Past Medical History:   Diagnosis Date    Abnormal findings on dx imaging of heart and cor circ     Acne vulgaris     Allergic contact dermatitis due to food in contact with skin     Atherosclerosis     Athscl heart disease of native coronary artery w/o ang pctrs     Chronic ischemic heart disease     Cutaneous abscess     Diabetic polyneuropathy (CMS-HCC)     Disorder of skin and subcutaneous tissue     Essential hypertension     H/O medication noncompliance     Hernia, inguinal, unilateral     Hypertrophic cardiomyopathy (CMS-HCC)     Malignant neoplasm of thyroid gland (CMS-HCC)     Presbyopia     Pseudofolliculitis barbae     Shoulder pain, bilateral     Tinea unguium     Type 2 diabetes mellitus (CMS-HCC)     Xerosis cutis        Past Surgical History:   Procedure Laterality Date    PR BRNCHSC EBUS GUIDED SAMPL 1/2 NODE STATION/STRUX N/A 01/09/2020    Procedure: BRONCH, RIGID OR FLEXIBLE, INC FLUORO GUIDANCE, WHEN PERFORMED; WITH EBUS GUIDED TRANSTRACHEAL AND/OR TRANSBRONCHIAL SAMPLING, ONE OR TWO MEDIASTINAL AND/OR HILAR LYMPH NODE STATIONS OR STRUCTURES;  Surgeon: Jerelyn Charles, MD;  Location: MAIN OR [place];  Service: Pulmonary    PR BRNSCHSC TNDSC EBUS DX/TX INTERVENTION PERPH LES Right 01/09/2020    Procedure: BRONCH, RIGID OR FLEXIBLE, INCLUDING FLUORO GUIDANCE, WHEN  PERFORMED; WITH TRANSENDOSCOPIC EBUS DURING BRONCHOSCOPIC DIAGNOSTIC OR THERAPEUTIC INTERVENTION(S) FOR PERIPHERAL LESION(S);  Surgeon: Jerelyn Charles, MD;  Location: MAIN OR [place];  Service: Pulmonary    PR BRONCHOSCOPY,COMPUTER ASSIST/IMAGE-GUIDED NAVIGATION Right 01/09/2020    Procedure: ROBOT ION BRONCHOSCOPY,RIGID OR FLEXIBLE,INCLUDE FLUORO WHEN PERFORMED; W/COMPUTER-ASSIST,IMAGE-GUIDED NAVIGATION;  Surgeon: Jerelyn Charles, MD;  Location: MAIN OR [place];  Service: Pulmonary    PR BRONCHOSCOPY,DIAGNOSTIC W LAVAGE Right 01/09/2020    Procedure: BRONCHOSCOPY, RIGID OR FLEXIBLE, INCLUDE FLUOROSCOPIC GUIDANCE WHEN PERFORMED; W/BRONCHIAL ALVEOLAR LAVAGE;  Surgeon: Jerelyn Charles, MD;  Location: MAIN OR [place];  Service: Pulmonary    PR BRONCHOSCOPY,TRANSBRON ASPIR BX Right 01/09/2020    Procedure: BRONCHOSCOPY, RIGID/FLEX, INCL FLUORO; W/TRANSBRONCH NDL ASPIRAT BX, TRACHEA, MAIN STEM &/OR LOBAR BRONCHUS;  Surgeon: Jerelyn Charles, MD;  Location: MAIN OR [place];  Service: Pulmonary    PR BRONCHOSCOPY,TRANSBRONCH BIOPSY Right 01/09/2020    Procedure: BRONCHOSCOPY, RIGID/FLEXIBLE, INCLUDE FLUORO GUIDANCE WHEN PERFORMED; W/TRANSBRONCHIAL LUNG BX, SINGLE LOBE;  Surgeon: Jerelyn Charles, MD;  Location: MAIN OR [facility];  Service: Pulmonary    PR CATH PLACE/CORON ANGIO, IMG SUPER/INTERP,W LEFT HEART VENTRICULOGRAPHY N/A 08/26/2019    Procedure: Left Heart Catheterization;  Surgeon: Marlaine Hind, MD;  Location: [facility] CATH;  Service: Cardiology    PR LIGATN THOR DUCT,CERV APPROACH Bilateral 07/28/2020    Procedure: SUTURE &/OR LIG THORACIC DUCT; CERV APPROACH;  Surgeon: Lauralee Evener, MD;  Location: MAIN OR [facility];  Service: ENT    PR REMOVAL NODES, NECK,CERV MOD RAD Bilateral 07/28/2020    Procedure: CERVICAL LYMPHADENECTOMY (MODIFIED RADICAL NECK DISSECTION);  Surgeon: Lauralee Evener, MD;  Location: MAIN OR [facility];  Service: ENT    PR THYROIDECTOMY,MALIG,LTD NECK SURG Bilateral 04/12/2020    Procedure: THYROIDECTOMY, TOTAL OR SUBTOTAL FOR MALIGNANCY; WITH LIMITED NECK DISSECTION;  Surgeon: Lauralee Evener, MD;  Location: MAIN OR [facility];  Service: ENT       Current Outpatient Medications   Medication Sig Dispense Refill    amlodipine (NORVASC) 10 MG tablet Take 1 tablet (10 mg total) by mouth daily. 90 tablet 3    atorvastatin (LIPITOR) 80 MG tablet Take 1 tablet (80 mg total) by mouth nightly. 90 tablet 2    blood sugar diagnostic (ACCU-CHEK GUIDE TEST STRIPS) Strp Use to check blood glucose once daily. 100 strip 1    blood-glucose meter kit Test blood glucose level once daily. 1 each 0    doxycycline (VIBRA-TABS) 100 MG tablet Take 1 tablet (100 mg total) by mouth two (2) times a day. Take with food. Take for entire 12 week course 168 tablet 0    gabapentin (NEURONTIN) 300 MG capsule Take 1 capsule (300 mg total) by mouth two (2) times a day. 180 capsule 2    hydroCHLOROthiazide (HYDRODIURIL) 25 MG tablet Take 1 tablet (25 mg total) by mouth daily. 90 tablet 1    insulin glargine (BASAGLAR, LANTUS) 100 unit/mL (3 mL) injection pen Inject 0.2 mL (20 Units total) under the skin nightly. 15 mL 0    lancets Misc Use to check blood glucose once daily. 100 each 1    lenvatinib (LENVIMA) 18 mg/day (10 mg x 1 AND 4 mg x 2) cap Take one (10mg ) capsule plus two (4mg ) capsules by mouth daily for a total of 18mg  daily. 90 capsule 2    levothyroxine (SYNTHROID) 200 MCG tablet Take 1 tablet (200 mcg total) by mouth daily. 90 tablet 1    losartan (COZAAR) 100 MG tablet Take 1 tablet (  100 mg total) by mouth daily. 90 tablet 1    metFORMIN (GLUCOPHAGE) 500 MG tablet Take 2 tablets (1,000 mg total) by mouth in the morning and 2 tablets (1,000 mg total) in the evening. Take with meals. 360 tablet 2    pen needle, diabetic 32 gauge x 5/32 (4 mm) Ndle Use with insulin glargine once daily 100 each 1    traMADol (ULTRAM) 50 mg tablet Take 1 tablet (50 mg total) by mouth every six (6) hours as needed for pain. 20 tablet 0     No current facility-administered medications for this visit.       Allergies   Allergen Reactions    Penicillin Anaphylaxis    Fish Derived     Lisinopril     Peanut Butter Flavor      Per allergy testing    Penicillins Other (See Comments)     Per patient, when he took at age 17 his legs and arms shook. Not sure if it was seizure or not.     Shellfish Containing Products     Soy        Social History     Tobacco Use    Smoking status: Former     Current packs/day: 0.00     Types: Cigarettes     Quit date: 2018     Years since quitting: 6.1    Smokeless tobacco: Never   Substance Use Topics    Drug use: Not Currently     Types: Marijuana     Comment: quit in 1992       Social History     Social History Narrative    Not on file       Family History   Family history unknown: Yes       Review of Systems: A 12-system review of systems was obtained including: Constitutional, Eyes, ENT, Cardiovascular, Respiratory, GI, GU, Musculoskeletal, Skin, Neurological, Psychiatric, Endocrine, Heme/Lymphatic, and Allergic/Immunologic systems. It is negative or non-contributory to the patient???s management except for as stated in patient's HPI    ECOG PS: 0    Physical Examination:  Vital Signs: BP 166/98  - Pulse 85  - Temp 37.2 ??C (99 ??F) (Temporal)  - Resp 16  - Ht 167.6 cm (5' 6)  - Wt 79 kg (174 lb 1.6 oz)  - SpO2 98%  - BMI 28.10 kg/m??   CONSTITUTIONAL: Well appearing man in NAD  Oral Cavity: MMM, no oral lesions or mucositis  Lymphatics: No lymphadenopathy   CV: RRR; no lower extremity edema  RESP: normal work of breathing  GI: Soft, non-tender, non-distended  SKIN: No skin rashes; groin not examined today  NEURO: no focal deficits appreciated  PSYCH: Normal mood and appropriate affect    LABS  Lab Results   Component Value Date    WBC 8.4 03/15/2022    HGB 13.6 03/15/2022    HCT 42.3 03/15/2022    PLT 306 03/15/2022       Lab Results   Component Value Date    NA 137 03/15/2022    K 3.5 03/15/2022    CL 103 03/15/2022    CO2 31.0 03/15/2022    BUN 9 03/15/2022    CREATININE 1.05 03/15/2022    GLU 239 (H) 03/15/2022    CALCIUM 8.7 03/15/2022    MG 1.6 07/31/2020    PHOS 3.0 07/31/2020       Lab Results   Component Value Date    BILITOT 0.4  03/15/2022    PROT 7.9 03/15/2022    ALBUMIN 3.1 (L) 03/15/2022    ALT 12 03/15/2022    AST 25 03/15/2022    ALKPHOS 93 03/15/2022       No results found for: PT, INR, APTT      IMAGING  CT Neck (03/15/2022)  -- No definitive recurrence within the thyroid resection bed or right jugular chain resection bed. No adenopathy.  -- Multiple bilateral upper lobe lung nodules. Please refer to dedicated chest CT for further evaluation.  -- Mucosal thickening of the inferior right maxillary sinus with bony dehiscence and periapical lucency of the second molar possibly suggesting odontogenic sinusitis.    CT Chest (03/15/2022)  -- Stable bilateral pulmonary metastases and sclerotic lesions within the right posterior eighth and 12th ribs. No new sites of disease within the chest.  -- Minimal coronary calcifications in LAD. Recommend primary care physician evaluation for cardiovascular risk assessment.    PATHOLOGY  THYROID GLAND  8th Edition - Protocol posted: 03/27/2018   THYROID GLAND: RESECTION - All Specimens  Clinical History  No known radiation exposure    SPECIMEN   Procedure  Total thyroidectomy    TUMOR   Tumor Focality  Multifocal    Tumor Characteristics     Tumor Site  Right lobe      Left lobe      Isthmus    Histologic Type  Papillary carcinoma, classic (usual, conventional)    Tumor Size  Greatest Dimension (Centimeters): 3.1 cm   Extrathyroidal Extension  Not identified    Angioinvasion (vascular invasion)  Not identified    Lymphatic Invasion  Not identified    Perineural Invasion  Not identified    Margins  Uninvolved by carcinoma    Distance of Invasive Carcinoma from Closest Margin (Millimeters)  1 mm   Mitotic Rate  4 Mitoses per 2 mm^2   LYMPH NODES   Number of Lymph Nodes Involved  14    Nodal Levels Involved  Level VI    Size of Largest Metastatic Deposit (Centimeters)  5 cm   Extranodal Extension (ENE)  Present    Number of Lymph Nodes Examined  14    Nodal Levels Examined  Level VI    PATHOLOGIC STAGE CLASSIFICATION (pTNM, AJCC 8th Edition)   TNM Descriptors  m (multiple primary tumors)         Primary Tumor (pT)  pT2    Regional Lymph Nodes (pN)  pN1a    Distant Metastasis (pM)  pM1    Site(s)  lung see [id number]      I have personally reviewed relevant imaging, laboratory values, existing medical records, and pathology. I have summarized these findings in the oncology history above.    Scribe's Attestation: Ginette Otto, DO obtained and performed the history, physical exam and medical decision making elements that were entered into the chart. Signed by Welton Flakes, Scribe, on March 15, 2022 at 12:32 PM     ----------------------------------------------------------------------------------------------------------------------  Documentation assistance provided by the Scribe. I was present during the time the encounter was recorded. The information recorded by the Scribe was done at my direction and has been reviewed and validated by me.  ----------------------------------------------------------------------------------------------------------------------

## 2022-03-17 LAB — THYROGLOBULIN PANEL: THYROGLOBULIN AB: <1.8 [IU]/mL

## 2022-03-17 LAB — REFLEX - THYROGLOBULIN, TUMOR MARKER BY IMMUNOASSAY: THYROGLOBULIN, TUMOR MARKER, IA: 107 ng/mL — ABNORMAL HIGH

## 2022-03-20 NOTE — Unmapped (Signed)
The [facility] has made a third and final attempt to reach this patient to refill the following medication:Lenvima 18mg /day.      We have been unable to leave messages on the following phone numbers: [phone] and have sent a MyChart message.    Dates contacted: 02/05, 02/13, 02/19  Last scheduled delivery: 02/17/22 for 30 day supply     The patient may be at risk of non-compliance with this medication. The patient should call the [facility] at [phone]  Option 4, then Option 1 (oncology) to refill medication.    Jasper Loser   [facility] Specialty Technician

## 2022-03-21 DIAGNOSIS — C73 Malignant neoplasm of thyroid gland: Principal | ICD-10-CM

## 2022-03-21 NOTE — Unmapped (Signed)
[facility] Refill Coordination Note    Specialty Medication(s) to be Shipped:   Hematology/Oncology: Paul Hines    Other medication(s) to be shipped:  basaglar     Paul Hines, DOB: 1970-11-03  Phone: There are no phone numbers on file.      All above HIPAA information was verified with patient.     Was a Nurse, learning disability used for this call? No    Completed refill call assessment today to schedule patient's medication shipment from the [facility] [phone]).  All relevant notes have been reviewed.     Specialty medication(s) and dose(s) confirmed: Regimen is correct and unchanged.   Changes to medications: [name] reports no changes at this time.  Changes to insurance: No  New side effects reported not previously addressed with a pharmacist or physician: None reported  Questions for the pharmacist: No    Confirmed patient received a Conservation officer, historic buildings and a Surveyor, mining with first shipment. The patient will receive a drug information handout for each medication shipped and additional FDA Medication Guides as required.       DISEASE/MEDICATION-SPECIFIC INFORMATION        N/A    SPECIALTY MEDICATION ADHERENCE     Medication Adherence    Patient reported X missed doses in the last month: 5  Specialty Medication: lenvima  Patient is on additional specialty medications: No  Patient is on more than two specialty medications: No  Any gaps in refill history greater than 2 weeks in the last 3 months: no  Demonstrates understanding of importance of adherence: yes  Informant: patient  Reliability of informant: reliable  Provider-estimated medication adherence level: good  Patient is at risk for Non-Adherence: No  Reasons for non-adherence: no problems identified  Confirmed plan for next specialty medication refill: delivery by pharmacy  Refills needed for supportive medications: not needed          Refill Coordination    Has the Patients' Contact Information Changed: No  Is the Shipping Address Different: No         Were doses missed due to medication being on hold? No    lenvima 18 mg: 5 days of medicine on hand       REFERRAL TO PHARMACIST     Referral to the pharmacist: Not needed      [facility]     Shipping address confirmed in Epic.     Patient was notified of new phone menu : No    Delivery Scheduled: Yes, Expected medication delivery date: 02/22.     Medication will be delivered via Next Day Courier to the prescription address in Epic WAM.    Paul Hines   [facility] Specialty Technician

## 2022-03-21 NOTE — Unmapped (Signed)
Patient [facility] Cancer Support Encounter    Mr. Rathman called to say hi and give me an update since his scans and appointment. On chart review, saw that [facility] been trying to reach him to schedule delivery for Lenvima 18mg /day. Per note, have been unable to leave a voicemail but did send MyChart message with callback number. Mr. Heineman is not an active MyChart user.     Conference called with [facility] to schedule delivery. Mr. Saucer aware that medication is to arrive on Thursday (2/22). [facility] notifying team about med compliance because Mr. Joachim believes he has missed about 5 doses of his Lenvima because he forgets to take it sometimes. Mr. Modglin educated on the importance of taking his Lenvima, as prescribed. Mr. Speyer verbalized understanding. No other needs at this time.     [name], [name], RN, [facility]   She/Her/Hers  Oncology Nurse Navigator - [name] [facility]  [facility]  [address] [place], [place] [address]    Center: [phone]  Voicemail: [phone]  [email or web address]

## 2022-03-23 NOTE — Unmapped (Signed)
Paul Hines 's Lenvima shipment will be delayed as a result of insurance processor is down.      I have reached out to the patient  at [phone] and communicated the delivery change. We will reschedule the medication for the delivery date that the patient agreed upon.  We have confirmed the delivery date as 2/26, via same day courier.

## 2022-03-27 MED FILL — LENVIMA 18 MG/DAY (10 MG X 1 AND 4 MG X 2) CAPSULE: ORAL | 30 days supply | Qty: 90 | Fill #1

## 2022-03-27 MED FILL — LANTUS SOLOSTAR U-100 INSULIN 100 UNIT/ML (3 ML) SUBCUTANEOUS PEN: SUBCUTANEOUS | 75 days supply | Qty: 15 | Fill #0

## 2022-04-06 NOTE — Unmapped (Signed)
Patient [facility] Cancer Support Encounter    Mr. Brintnall called and left a message asking me to call back. Called back. No answer and no voicemail set-up.     [name], [name], RN, [facility]   She/Her/Hers  Oncology Nurse Navigator - [name] Patient [facility]  [facility]  [address] [place], [place] [address]    Center: [phone]  Voicemail: [phone]  [email or web address]

## 2022-04-17 NOTE — Unmapped (Signed)
Patient [facility] Cancer Support Encounter    Mr. Zapanta called Patient [facility] for assistance scheduling Medicaid transportation. Contacted [employer] [facility] [place] [facility] [phone]) with Mr. Mcnamar on conference call. Transportation arranged for 3/20 and 3/27 appointments. Trip numbers: [phone] (3/20) and [phone] (3/27). No other needs at this time. Plans to stop into Patient [facility] while here for 3/20 appointments.    [name], [name], RN, [facility]   She/Her/Hers  Oncology Nurse Navigator - [name] Patient [facility]  [facility]  [address], [place], [place] [address]    Center: [phone]  Voicemail: [phone]  [email or web address]

## 2022-04-17 NOTE — Unmapped (Signed)
[facility] Clinical Assessment & Refill Coordination Note    Paul Hines, DOB: [birth year]  Phone: There are no phone numbers on file.    All above HIPAA information was verified with patient.     Was a Nurse, learning disability used for this call? No    Specialty Medication(s):   Hematology/Oncology: [name]     Current Outpatient Medications   Medication Sig Dispense Refill    amlodipine (NORVASC) 10 MG tablet Take 1 tablet (10 mg total) by mouth daily. 90 tablet 3    atorvastatin (LIPITOR) 80 MG tablet Take 1 tablet (80 mg total) by mouth nightly. 90 tablet 2    blood sugar diagnostic (ACCU-CHEK GUIDE TEST STRIPS) Strp Use to check blood glucose once daily. 100 strip 1    blood-glucose meter kit Test blood glucose level once daily. 1 each 0    doxycycline (VIBRA-TABS) 100 MG tablet Take 1 tablet (100 mg total) by mouth two (2) times a day. Take with food. Take for entire 12 week course 168 tablet 0    gabapentin (NEURONTIN) 300 MG capsule Take 1 capsule (300 mg total) by mouth two (2) times a day. 180 capsule 2    hydroCHLOROthiazide (HYDRODIURIL) 25 MG tablet Take 1 tablet (25 mg total) by mouth daily. 90 tablet 1    insulin glargine (BASAGLAR, LANTUS) 100 unit/mL (3 mL) injection pen Inject 0.2 mL (20 Units total) under the skin nightly. 15 mL 0    lancets Misc Use to check blood glucose once daily. 100 each 1    lenvatinib (LENVIMA) 18 mg/day (10 mg x 1 AND 4 mg x 2) cap Take one (10mg ) capsule plus two (4mg ) capsules by mouth daily for a total of 18mg  daily. 90 capsule 2    levothyroxine (SYNTHROID) 200 MCG tablet Take 1 tablet (200 mcg total) by mouth daily. 90 tablet 1    losartan (COZAAR) 100 MG tablet Take 1 tablet (100 mg total) by mouth daily. 90 tablet 1    metFORMIN (GLUCOPHAGE) 500 MG tablet Take 2 tablets (1,000 mg total) by mouth in the morning and 2 tablets (1,000 mg total) in the evening. Take with meals. 360 tablet 2    pen needle, diabetic 32 gauge x 5/32 (4 mm) Ndle Use with insulin glargine once daily 100 each 1    traMADol (ULTRAM) 50 mg tablet Take 1 tablet (50 mg total) by mouth every six (6) hours as needed for pain. 20 tablet 0     No current facility-administered medications for this visit.        Changes to medications: [name] reports no changes at this time.    Allergies   Allergen Reactions    Penicillin Anaphylaxis    Fish Derived     Lisinopril     Peanut Butter Flavor      Per allergy testing    Penicillins Other (See Comments)     Per patient, when he took at age 23 his legs and arms shook. Not sure if it was seizure or not.     Shellfish Containing Products     Soy        Changes to allergies: No    SPECIALTY MEDICATION ADHERENCE     Lenvima 18 mg: 10 days of medicine on hand   Medication Adherence    Patient reported X missed doses in the last month: 0  Specialty Medication: Lenvima 18mg           Specialty  medication(s) dose(s) confirmed: Regimen is correct and unchanged.     Are there any concerns with adherence? No    Adherence counseling provided? Not needed    CLINICAL MANAGEMENT AND INTERVENTION      Clinical Benefit Assessment:    Do you feel the medicine is effective or helping your condition? Yes    Clinical Benefit counseling provided? Not needed    Adverse Effects Assessment:    Are you experiencing any side effects? No    Are you experiencing difficulty administering your medicine? No    Quality of Life Assessment:    Quality of Life    Rheumatology  Oncology  1. What impact has your specialty medication had on the reduction of your daily pain or discomfort level?: None  2. On a scale of 1-10, how would you rate your ability to manage side effects associated with your specialty medication? (1=no issues, 10 = unable to take medication due to side effects): 1  Dermatology  Cystic Fibrosis          How many days over the past month did your condition/medication  keep you from your normal activities? For example, brushing your teeth or getting up in the morning. 0    Have you discussed this with your provider? Not needed    Acute Infection Status:    Acute infections noted within Epic:  No active infections  Patient reported infection: None    Therapy Appropriateness:    Is therapy appropriate and patient progressing towards therapeutic goals? Yes, therapy is appropriate and should be continued    DISEASE/MEDICATION-SPECIFIC INFORMATION      N/A    Oncology: Is the patient receiving adequate infection prevention treatment? Not applicable  Does the patient have adequate nutritional support? Not applicable    PATIENT SPECIFIC NEEDS     Does the patient have any physical, cognitive, or cultural barriers? No    Is the patient high risk? Yes, patient is taking oral chemotherapy. Appropriateness of therapy as been assessed    Did the patient require a clinical intervention? No    Does the patient require physician intervention or other additional services (i.e., nutrition, smoking cessation, social work)? No    SOCIAL DETERMINANTS OF HEALTH     At the [facility], we have learned that life circumstances - like trouble affording food, housing, utilities, or transportation can affect the health of many of our patients.   That is why we wanted to ask: are you currently experiencing any life circumstances that are negatively impacting your health and/or quality of life? No    Social Determinants of Health     Financial Resource Strain: Low Risk  (07/29/2020)    Overall Financial Resource Strain (CARDIA)     Difficulty of Paying Living Expenses: Not hard at all   Internet Connectivity: Not on file   Food Insecurity: No Food Insecurity (07/29/2020)    Hunger Vital Sign     Worried About Running Out of Food in the Last Year: Never true     Ran Out of Food in the Last Year: Never true   Tobacco Use: Medium Risk (03/28/2022)    Patient History     Smoking Tobacco Use: Former     Smokeless Tobacco Use: Never     Passive Exposure: Not on file   Housing/Utilities: Low Risk (07/29/2020)    Housing/Utilities     Within the past 12 months, have you ever stayed: outside, in a car, in a  tent, in an overnight shelter, or temporarily in someone else's home (i.e. couch-surfing)?: No     Are you worried about losing your housing?: No     Within the past 12 months, have you been unable to get utilities (heat, electricity) when it was really needed?: No   Alcohol Use: Alcohol Misuse (08/22/2021)    Received from [facility]    AUDIT-C     Frequency of Alcohol Consumption: 2-3 times a week     Average Number of Drinks: 5 or 6     Frequency of Binge Drinking: Weekly   Transportation Needs: No Transportation Needs (11/18/2021)    Received from [facility]    PRAPARE - Transportation     Lack of Transportation (Medical): No     Lack of Transportation (Non-Medical): No   Substance Use: Not on file   Health Literacy: Not on file   Physical Activity: Not on file   Interpersonal Safety: Not on file   Stress: Not on file   Intimate Partner Violence: Not on file   Depression: Not at risk (08/22/2021)    Received from [facility]    PHQ-2     Total Score =: 0   Social Connections: Not on file       Would you be willing to receive help with any of the needs that you have identified today? Not applicable       SHIPPING     Specialty Medication(s) to be Shipped:   Hematology/Oncology: [name]    Other medication(s) to be shipped: No additional medications requested for fill at this time     Changes to insurance: No    Patient was informed of new phone menu: Yes    Delivery Scheduled: Yes, Expected medication delivery date: 04/24/22.     Medication will be delivered via Same Day Courier to the confirmed prescription address in [facility].    The patient will receive a drug information handout for each medication shipped and additional FDA Medication Guides as required.  Verified that patient has previously received a Conservation officer, historic buildings and a Surveyor, mining.    The patient or caregiver noted above participated in the development of this care plan and knows that they can request review of or adjustments to the care plan at any time.      All of the patient's questions and concerns have been addressed.    Rollen Sox, [facility]   [facility] Shared [facility] Specialty Pharmacist

## 2022-04-19 ENCOUNTER — Ambulatory Visit: Admit: 2022-04-19 | Discharge: 2022-04-20 | Payer: PRIVATE HEALTH INSURANCE | Attending: Oncology | Primary: Oncology

## 2022-04-19 ENCOUNTER — Other Ambulatory Visit: Admit: 2022-04-19 | Discharge: 2022-04-20 | Payer: PRIVATE HEALTH INSURANCE

## 2022-04-19 DIAGNOSIS — C73 Malignant neoplasm of thyroid gland: Principal | ICD-10-CM

## 2022-04-19 LAB — COMPREHENSIVE METABOLIC PANEL
ALBUMIN: 2.9 g/dL — ABNORMAL LOW (ref 3.4–5.0)
ALKALINE PHOSPHATASE: 75 U/L (ref 46–116)
ALT (SGPT): 26 U/L (ref 10–49)
ANION GAP: 6 mmol/L (ref 5–14)
AST (SGOT): 38 U/L — ABNORMAL HIGH (ref <=34)
BILIRUBIN TOTAL: 0.2 mg/dL — ABNORMAL LOW (ref 0.3–1.2)
BLOOD UREA NITROGEN: 11 mg/dL (ref 9–23)
BUN / CREAT RATIO: 10
CALCIUM: 8.4 mg/dL — ABNORMAL LOW (ref 8.7–10.4)
CHLORIDE: 103 mmol/L (ref 98–107)
CO2: 27 mmol/L (ref 20.0–31.0)
CREATININE: 1.1 mg/dL
EGFR CKD-EPI (2021) MALE: 80 mL/min/{1.73_m2} (ref >=60)
GLUCOSE RANDOM: 425 mg/dL (ref 70–179)
POTASSIUM: 3.8 mmol/L (ref 3.4–4.8)
PROTEIN TOTAL: 7.2 g/dL (ref 5.7–8.2)
SODIUM: 136 mmol/L (ref 135–145)

## 2022-04-19 LAB — CBC W/ AUTO DIFF
BASOPHILS ABSOLUTE COUNT: 0 10*9/L (ref 0.0–0.1)
BASOPHILS RELATIVE PERCENT: 0.5 %
EOSINOPHILS ABSOLUTE COUNT: 0.1 10*9/L (ref 0.0–0.5)
EOSINOPHILS RELATIVE PERCENT: 1.4 %
HEMATOCRIT: 36.8 % — ABNORMAL LOW (ref 39.0–48.0)
HEMOGLOBIN: 12 g/dL — ABNORMAL LOW (ref 12.9–16.5)
LYMPHOCYTES ABSOLUTE COUNT: 0.9 10*9/L — ABNORMAL LOW (ref 1.1–3.6)
LYMPHOCYTES RELATIVE PERCENT: 12.9 %
MEAN CORPUSCULAR HEMOGLOBIN CONC: 32.5 g/dL (ref 32.0–36.0)
MEAN CORPUSCULAR HEMOGLOBIN: 23.9 pg — ABNORMAL LOW (ref 25.9–32.4)
MEAN CORPUSCULAR VOLUME: 73.6 fL — ABNORMAL LOW (ref 77.6–95.7)
MEAN PLATELET VOLUME: 8.4 fL (ref 6.8–10.7)
MONOCYTES ABSOLUTE COUNT: 0.5 10*9/L (ref 0.3–0.8)
MONOCYTES RELATIVE PERCENT: 6.9 %
NEUTROPHILS ABSOLUTE COUNT: 5.5 10*9/L (ref 1.8–7.8)
NEUTROPHILS RELATIVE PERCENT: 78.3 %
PLATELET COUNT: 312 10*9/L (ref 150–450)
RED BLOOD CELL COUNT: 5.01 10*12/L (ref 4.26–5.60)
RED CELL DISTRIBUTION WIDTH: 15.9 % — ABNORMAL HIGH (ref 12.2–15.2)
WBC ADJUSTED: 7 10*9/L (ref 3.6–11.2)

## 2022-04-19 LAB — T4, FREE: FREE T4: 0.34 ng/dL — ABNORMAL LOW (ref 0.89–1.76)

## 2022-04-19 LAB — SLIDE REVIEW

## 2022-04-19 LAB — TSH: THYROID STIMULATING HORMONE: 48.994 u[IU]/mL — ABNORMAL HIGH (ref 0.550–4.780)

## 2022-04-19 MED ORDER — INSULIN GLARGINE (U-100) 100 UNIT/ML (3 ML) SUBCUTANEOUS PEN
Freq: Every evening | SUBCUTANEOUS | 2 refills | 50 days | Status: CP
Start: 2022-04-19 — End: 2023-04-19

## 2022-04-19 MED ORDER — IBUPROFEN 600 MG TABLET
ORAL_TABLET | Freq: Three times a day (TID) | ORAL | 1 refills | 10 days | Status: CP | PRN
Start: 2022-04-19 — End: ?
  Filled 2022-04-19: qty 30, 10d supply, fill #0

## 2022-04-19 MED ORDER — LEVOTHYROXINE 125 MCG TABLET
ORAL_TABLET | Freq: Every day | ORAL | 3 refills | 90 days | Status: CP
Start: 2022-04-19 — End: ?
  Filled 2022-04-19: qty 90, 45d supply, fill #0

## 2022-04-19 MED ORDER — LOSARTAN 100 MG-HYDROCHLOROTHIAZIDE 25 MG TABLET
ORAL_TABLET | Freq: Every day | ORAL | 3 refills | 90 days | Status: CP
Start: 2022-04-19 — End: 2023-04-19
  Filled 2022-04-19: qty 90, 90d supply, fill #0

## 2022-04-19 MED ORDER — SPIRONOLACTONE 25 MG TABLET
ORAL_TABLET | Freq: Every day | ORAL | 3 refills | 90 days | Status: CP
Start: 2022-04-19 — End: 2023-04-19
  Filled 2022-04-19: qty 90, 90d supply, fill #0

## 2022-04-19 MED FILL — DOXYCYCLINE HYCLATE 100 MG TABLET: ORAL | 34 days supply | Qty: 68 | Fill #0

## 2022-04-19 NOTE — Unmapped (Addendum)
Paul Hines, it was great to see you today. Here are the changes we discussed:    Increase Basaglar to 30 units under the skin nightly   Start spironolactone 25 mg 1 tablet by mouth once daily   We will combine your  losartan + hydrochrolothiazide (blood pressure pills). So now, you will just take the 1 losartan/hydrochlorothiazide tablet by mouth once daily  Continue amlodipine 10 mg 1 tablet by mouth daily  We will increase your thyroid medicine to 250 mcg daily (this is two of the 125 mcg pills every morning)  Start ibuprofen 600 mg every 6-8 hours as needed for pain   Continue all other home medications. Consider using a pill box and putting it somewhere to help remind you to take your medications in the morning (I.e. by your toothbrush, or by coffee machine).   Scans + check-in with Dr. Rosana Hoes in 6 weeks.

## 2022-04-24 MED FILL — LENVIMA 18 MG/DAY (10 MG X 1 AND 4 MG X 2) CAPSULE: ORAL | 30 days supply | Qty: 90 | Fill #2

## 2022-04-26 ENCOUNTER — Ambulatory Visit
Admit: 2022-04-26 | Discharge: 2022-04-27 | Payer: PRIVATE HEALTH INSURANCE | Attending: Internal Medicine | Primary: Internal Medicine

## 2022-04-26 DIAGNOSIS — L732 Hidradenitis suppurativa: Principal | ICD-10-CM

## 2022-04-26 DIAGNOSIS — Z79899 Other long term (current) drug therapy: Principal | ICD-10-CM

## 2022-04-26 MED ORDER — HUMIRA PEN CROHN'S-ULC COLITIS-HID SUP STARTER 40 MG/0.8 ML SUBCUT KIT
SUBCUTANEOUS | 5 refills | 28.00000 days | Status: CP
Start: 2022-04-26 — End: 2022-04-26

## 2022-04-26 MED ORDER — HUMIRA PEN CITRATE FREE STARTER PACK FOR CROHN'S/UC/HS 3 X 80 MG/0.8 ML
ORAL | 0 refills | 0.00000 days | Status: CP
Start: 2022-04-26 — End: 2022-04-26

## 2022-04-26 NOTE — Unmapped (Signed)
Dermatology Note     Assessment and Plan:      Hidradenitis suppurativa, severe, Hurley III, Chronic: flared or not at treatment goal  Current stage 4 metastatic thyroid cancer being treated with lenvatinib   - We discussed the typical natural history, pathogenesis, treatment options, and expected course as well as the relapsing and sometimes recalcitrant nature of the disease and potential for sinus tract formation and scarring.    - We discussed associated comorbid conditions (pilonidal cysts, IBD, psoriasis, acne, dissecting cellulitis)  - Discussed that combination of medical and surgical therapy is often required for treatment.   - Reviewed association between HS and tobacco use; encouraged cessation  - Reviewed role of antibiotics in HS to decrease inflammation   - Discussed that the medication he is on for his metastatic thyroid cancer (lenvatinib) can sometimes cause lesions to flare  - Patient has a history of uncontrolled diabetes mellitus (most recent A1C 9.8 on 02/01/22), encouraged glycemic control as this can also help with disease activity  - Referring oncologist, Dr. Ginette Otto Hemant, ok for [place] to initiate Humira as in line w goals of care    Plan:  - Start adalimumab  - We discussed risks associated with the medication including serious infections, injection site reactions, psoriasiform reactions, certain malignancies, lupus-like reactions and demyelinating disease, among others.  - HUMIRA PEN CITRATE FREE STARTER PACK FOR CROHN'S/UC/HS 3 X 80 MG/0.8 ML; 160 mg day 0, then 80 mg on day 14. Loading dose.  - adalimumab (HUMIRA PEN CROHNS-UC-HS START) 40 mg/0.8 mL injection PEN; Inject 0.8 mL (40 mg total) under the skin once a week. Maintenance dose.  - Continue doxycycline 100 mg BID PRN for flares  - Discussed potential side effects including sun sensitivity, allergic reaction, GI upset including esophagitis and diarrhea and to stop the medication and call if these should occur.   - Continue Hibiclens wash qday in shower  - Start Vaseline as needed    High risk medication use, Humira  - Quantiferon TB Gold Plus; Future    The patient was advised to call for an appointment should any new, changing, or symptomatic lesions develop.     RTC: Return in about 3 months (around 07/27/2022) for HS. or sooner as needed   _________________________________________________________________      Chief Complaint     Chief Complaint   Patient presents with     hidradenitis suppurativa     Follow-up appt. Pt states that he has not had any improvement        HPI     Paul Hines is a 52 y.o. male who presents as a returning patient (last seen 03/01/2022) to Dermatology for follow up of HS.    Today patient reports:  - he is taking doxycycline, which has been ineffective    The patient denies any other new or changing lesions or areas of concern.     Pertinent Past Medical History     Past Medical History, Family History, Social History, Medication List, Allergies, and Problem List were reviewed in the rooming section of Epic.     ROS: Other than symptoms mentioned in the HPI, no fevers, chills, or other skin complaints    Physical Examination     GENERAL: Well-appearing male in no acute distress, resting comfortably.  NEURO: Alert and oriented, answers questions appropriately  SKIN: Examination of the bilateral lower extremities, buttocks, genitalia, and groin was performed    - extensive inflammatory nodules, tracts and  scarring in the bilateral inguinal creases, groin and buttocks    All areas not commented on are within normal limits or unremarkable    (Approved Template 10/13/2019)

## 2022-04-27 DIAGNOSIS — L732 Hidradenitis suppurativa: Principal | ICD-10-CM

## 2022-05-01 LAB — QUANTIFERON TB GOLD PLUS
QUANTIFERON ANTIGEN 1 MINUS NIL: 0 [IU]/mL
QUANTIFERON ANTIGEN 2 MINUS NIL: 0 [IU]/mL
QUANTIFERON MITOGEN: 10 [IU]/mL
QUANTIFERON TB GOLD PLUS: NEGATIVE
QUANTIFERON TB NIL VALUE: 0 [IU]/mL

## 2022-05-01 LAB — TB AG2: TB AG2 VALUE: 0

## 2022-05-01 LAB — TB NIL: TB NIL VALUE: 0

## 2022-05-01 LAB — TB MITOGEN: TB MITOGEN VALUE: 10

## 2022-05-01 LAB — TB AG1: TB AG1 VALUE: 0

## 2022-05-04 NOTE — Unmapped (Signed)
Patient [facility] Cancer Support Encounter    [name] called the Patient & Paul Hines. Reviewed upcoming appointments and arrival times. No other needs at this time.     [name], [name], RN, [facility]   She/Her/Hers  Oncology Nurse Navigator - [name] Patient [facility]  [name] [place]  [address] [place], [place] [address]    Center: [phone]  Voicemail: [phone]  [email or web address]

## 2022-05-04 NOTE — Unmapped (Signed)
Humira was denied because insurance requires Hep B Sag and Core Ab. Spoke to patient via telephone and he would like to have these labs drawn at his 4/30 appointment. Plan to order these labs at his appointment.

## 2022-05-15 DIAGNOSIS — C799 Secondary malignant neoplasm of unspecified site: Principal | ICD-10-CM

## 2022-05-15 DIAGNOSIS — C73 Malignant neoplasm of thyroid gland: Principal | ICD-10-CM

## 2022-05-15 MED ORDER — LENVIMA 18 MG/DAY (10 MG X 1 AND 4 MG X 2) CAPSULE
ORAL_CAPSULE | Freq: Every day | ORAL | 2 refills | 30.00000 days
Start: 2022-05-15 — End: ?

## 2022-05-18 NOTE — Unmapped (Signed)
Patient [facility] Cancer Support Encounter    Mr. Glunt called the Patient & [facility] for assistance with upcoming appointments. Provided active listening and emotional support as patient discussed current needs and challenges. Reviewed upcoming appointments and their locations. Mr. Catts requested that appointment addresses be sent via text. Sent addressees and contact information for [employer] for [employer] [phone]) to Mr. Tafel phone.     No other needs at this time. Mr. Haser asked that I call and check-in next week. Placed on my list for a follow-up call.     [name], [name], RN, [facility]   She/Her/Hers  Oncology Nurse Navigator - [name] Patient [facility]  [facility]  [address] [place], [place] [address]    Center: [phone]  Voicemail: [phone]  Andrw [email or web address]

## 2022-05-19 DIAGNOSIS — C73 Malignant neoplasm of thyroid gland: Principal | ICD-10-CM

## 2022-05-19 DIAGNOSIS — C799 Secondary malignant neoplasm of unspecified site: Principal | ICD-10-CM

## 2022-05-22 DIAGNOSIS — C799 Secondary malignant neoplasm of unspecified site: Principal | ICD-10-CM

## 2022-05-22 DIAGNOSIS — C73 Malignant neoplasm of thyroid gland: Principal | ICD-10-CM

## 2022-05-22 MED ORDER — LENVIMA 18 MG/DAY (10 MG X 1 AND 4 MG X 2) CAPSULE
ORAL_CAPSULE | Freq: Every day | ORAL | 2 refills | 30 days | Status: CP
Start: 2022-05-22 — End: ?
  Filled 2022-06-05: qty 90, 30d supply, fill #0

## 2022-05-23 NOTE — Unmapped (Signed)
Patient [facility] Cancer Support Encounter    Paul Hines called the Patient & [facility]. Provided active listening and emotional support as patient discussed current needs and challenges.    Asked that I speak to Paul Hines, his community Child psychotherapist, who is helping arrange transportation for upcoming appointments.     Paul Hines called me directly. Reviewed upcoming appointment locations and times with her.     [name], [name], RN, [facility]   She/Her/Hers  Oncology Nurse Navigator - [name] Patient [facility]  [facility]  [address] [place], [place] [address]    Center: [phone]  Voicemail: [phone]  Paul [name]

## 2022-05-24 NOTE — Unmapped (Signed)
Patient [facility] Cancer Support Encounter    Mr. Bronner called the Patient & [facility] and left message. Called back, but no answer and no VM box set-up.    Mr. Hugo called back. Called to confirm appointments. Reviewed appointment times and locations. County Child psychotherapist to bring to appointments on 4/30 and Mr. Farran to arrange transportation for appointments on 4/26 and 5/1.     [name], [name], RN, [facility]   She/Her/Hers  Oncology Nurse Navigator - [name] Patient [facility]  [facility]  [address] [place], [place] [address]    Center: [phone]  Voicemail: [phone]  Ernst [email or web address]

## 2022-05-24 NOTE — Unmapped (Signed)
Patient [facility] Cancer Support Encounter    Nettie Elm, community social worker, called to confirm appointments in May to ensure no conflicts with DSS interview for Mr. Henneberger. Reviewed appointments. Nettie Elm to call back to discuss times of appointments once she rearranges DSS appointment.      [name], [name], RN, [facility]   She/Her/Hers  Oncology Nurse Navigator - [name] Patient [facility]  [facility]  [address] [place], [place] [address]    Center: [phone]  Voicemail: [phone]  Lisl [email or web address]

## 2022-05-25 NOTE — Unmapped (Unsigned)
[facility]  New Patient Clinic Note    Assessment/Plan:   [name] is a 52 y.o.male    Problem List Items Addressed This Visit    None      Attending: Dr. Marland Kitchen    Subjective   [name] is a 52 y.o. male  coming to clinic today for the following issues:    No chief complaint on file.    HPI:    ***    ROS: ROS    I have reviewed the problem list, medications, and allergies and have updated/reconciled them if needed.    HISTORY:    Past Medical History:   Diagnosis Date    Abnormal findings on dx imaging of heart and cor circ     Acne vulgaris     Allergic contact dermatitis due to food in contact with skin     Atherosclerosis     Athscl heart disease of native coronary artery w/o ang pctrs     Chronic ischemic heart disease     Cutaneous abscess     Diabetic polyneuropathy (CMS-HCC)     Disorder of skin and subcutaneous tissue     Essential hypertension     H/O medication noncompliance     Hernia, inguinal, unilateral     Hypertrophic cardiomyopathy (CMS-HCC)     Malignant neoplasm of thyroid gland (CMS-HCC)     Presbyopia     Pseudofolliculitis barbae     Shoulder pain, bilateral     Tinea unguium     Type 2 diabetes mellitus (CMS-HCC)     Xerosis cutis      Past Surgical History:   Procedure Laterality Date    PR BRNCHSC EBUS GUIDED SAMPL 1/2 NODE STATION/STRUX N/A 01/09/2020    Procedure: BRONCH, RIGID OR FLEXIBLE, INC FLUORO GUIDANCE, WHEN PERFORMED; WITH EBUS GUIDED TRANSTRACHEAL AND/OR TRANSBRONCHIAL SAMPLING, ONE OR TWO MEDIASTINAL AND/OR HILAR LYMPH NODE STATIONS OR STRUCTURES;  Surgeon: Jerelyn Charles, MD;  Location: MAIN OR [place];  Service: Pulmonary    PR BRNSCHSC TNDSC EBUS DX/TX INTERVENTION PERPH LES Right 01/09/2020    Procedure: BRONCH, RIGID OR FLEXIBLE, INCLUDING FLUORO GUIDANCE, WHEN PERFORMED; WITH TRANSENDOSCOPIC EBUS DURING BRONCHOSCOPIC DIAGNOSTIC OR THERAPEUTIC INTERVENTION(S) FOR PERIPHERAL LESION(S);  Surgeon: Jerelyn Charles, MD;  Location: MAIN OR [place];  Service: Pulmonary    PR BRONCHOSCOPY,COMPUTER ASSIST/IMAGE-GUIDED NAVIGATION Right 01/09/2020    Procedure: ROBOT ION BRONCHOSCOPY,RIGID OR FLEXIBLE,INCLUDE FLUORO WHEN PERFORMED; W/COMPUTER-ASSIST,IMAGE-GUIDED NAVIGATION;  Surgeon: Jerelyn Charles, MD;  Location: MAIN OR [place];  Service: Pulmonary    PR BRONCHOSCOPY,DIAGNOSTIC W LAVAGE Right 01/09/2020    Procedure: BRONCHOSCOPY, RIGID OR FLEXIBLE, INCLUDE FLUOROSCOPIC GUIDANCE WHEN PERFORMED; W/BRONCHIAL ALVEOLAR LAVAGE;  Surgeon: Jerelyn Charles, MD;  Location: MAIN OR [place];  Service: Pulmonary    PR BRONCHOSCOPY,TRANSBRON ASPIR BX Right 01/09/2020    Procedure: BRONCHOSCOPY, RIGID/FLEX, INCL FLUORO; W/TRANSBRONCH NDL ASPIRAT BX, TRACHEA, MAIN STEM &/OR LOBAR BRONCHUS;  Surgeon: Jerelyn Charles, MD;  Location: MAIN OR [place];  Service: Pulmonary    PR BRONCHOSCOPY,TRANSBRONCH BIOPSY Right 01/09/2020    Procedure: BRONCHOSCOPY, RIGID/FLEXIBLE, INCLUDE FLUORO GUIDANCE WHEN PERFORMED; W/TRANSBRONCHIAL LUNG BX, SINGLE LOBE;  Surgeon: Jerelyn Charles, MD;  Location: MAIN OR [place];  Service: Pulmonary    PR CATH PLACE/CORON ANGIO, IMG SUPER/INTERP,W LEFT HEART VENTRICULOGRAPHY N/A 08/26/2019    Procedure: Left Heart Catheterization;  Surgeon: Marlaine Hind, MD;  Location: [facility] CATH;  Service: Cardiology    PR LIGATN THOR DUCT,CERV APPROACH Bilateral 07/28/2020  Procedure: SUTURE &/OR LIG THORACIC DUCT; CERV APPROACH;  Surgeon: Lauralee Evener, MD;  [facility];  Service: ENT    PR REMOVAL NODES, NECK,CERV MOD RAD Bilateral 07/28/2020    Procedure: CERVICAL LYMPHADENECTOMY (MODIFIED RADICAL NECK DISSECTION);  Surgeon: Lauralee Evener, MD;  Location: [facility]IN OR [facility];  Service: ENT    PR THYROIDECTOMY,MALIG,LTD NECK SURG Bilateral 04/12/2020    Procedure: THYROIDECTOMY, TOTAL OR SUBTOTAL FOR MALIGNANCY; WITH LIMITED NECK DISSECTION;  Surgeon: Lauralee Evener, MD;  Location: MAIN OR [facility];  Service: ENT Allergies   Allergen Reactions    Penicillin Anaphylaxis    Fish Derived     Lisinopril     Peanut Butter Flavor      Per allergy testing    Penicillins Other (See Comments)     Per patient, when he took at age 44 his legs and arms shook. Not sure if it was seizure or not.     Shellfish Containing Products     Soy      Social History     Tobacco Use    Smoking status: Some Days     Types: Cigarettes     Passive exposure: Current    Smokeless tobacco: Never   Vaping Use    Vaping status: Never Used   Substance Use Topics    Alcohol use: Yes    Drug use: Not Currently     Types: Marijuana     Comment: quit in 1992     Family History   Problem Relation Age of Onset    Melanoma Neg Hx     Basal cell carcinoma Neg Hx     Squamous cell carcinoma Neg Hx        Health Maintenance   Topic Date Due    Pneumococcal Vaccine 0-64 (1 of 2 - PCV) Never done    Foot Exam  Never done    Retinal Eye Exam  Never done    Urine Albumin/Creatinine Ratio  Never done    DTaP/Tdap/Td Vaccines (5 - Tdap) 02/12/1979    Hepatitis C Screen  Never done    Zoster Vaccines (1 of 2) Never done    Colon Cancer Screening  Never done    COVID-19 Vaccine (6 - 2023-24 season) 09/30/2021    Hemoglobin A1c  05/03/2022    Influenza Vaccine (Season Ended) 10/01/2022    Serum Creatinine Monitoring  04/19/2023    Potassium Monitoring  04/19/2023       Objective     Vitals: There were no vitals taken for this visit.    Physical Exam    Labs/Imaging:  {Labs/Imaging options:52765}    Cherie Dark, MD  Family Medicine Resident PGY-1  [facility]  [address] [place], [place] [address]  Telephone [phone]  Fax [phone]  [email or web address]

## 2022-05-26 NOTE — Unmapped (Signed)
[facility] Support Encounter    Nettie Elm, community social worker, called Patient & [facility] and reported that Mr. Franzoni had missed his Family Medicine appointment due to missing his bus. Needed help rescheduling. Called Family Medicine to reschedule. Called Nettie Elm to update her on next available appointment. Nettie Elm reports that she can bring him to rescheduled appointment on 6/14.     Called Mr. Waites to update him that appointment has been rescheduled and ride arranged with Nettie Elm. Reviewed Mr. Plucinski upcoming appointments with him.     [name], [name], RN, [facility]   She/Her/Hers  Oncology Nurse Navigator - [name] Patient [facility]  [facility]  [address], [place], [place] [address]    Center: [phone]  Voicemail: [phone]  Malaquias [email or web address]

## 2022-05-29 DIAGNOSIS — L732 Hidradenitis suppurativa: Principal | ICD-10-CM

## 2022-05-29 MED ORDER — DOXYCYCLINE HYCLATE 100 MG TABLET
ORAL_TABLET | Freq: Two times a day (BID) | ORAL | 0 refills | 84 days
Start: 2022-05-29 — End: 2022-08-21

## 2022-05-29 NOTE — Unmapped (Signed)
The [facility] has made a third and final attempt to reach this patient to refill the following medication:Lenvima 18mg /day.      We have been unable to leave messages on the following phone numbers: [phone] and have sent a MyChart message.    Dates contacted: 04/15, 04/22, 04/29  Last scheduled delivery: 04/24/22 for 30 day supply    The patient may be at risk of non-compliance with this medication. The patient should call the [facility] at [phone]  Option 4, then Option 1: Oncology to refill medication.    Jasper Loser   [facility] Specialty Technician

## 2022-05-29 NOTE — Unmapped (Signed)
[facility] Refill Coordination Note    Specialty Medication(s) to be Shipped:   Hematology/Oncology: Paul Hines    Other medication(s) to be shipped:  doxycyline 100mg   ** denied any refills on other meds**     Paul Hines, DOB: 16-Jul-1970  Phone: There are no phone numbers on file.      All above HIPAA information was verified with patient.     Was a Nurse, learning disability used for this call? No    Completed refill call assessment today to schedule patient's medication shipment from the [facility] [phone]).  All relevant notes have been reviewed.     Specialty medication(s) and dose(s) confirmed: Regimen is correct and unchanged.   Changes to medications: Paul Hines reports no changes at this time.  Changes to insurance: No  New side effects reported not previously addressed with a pharmacist or physician: None reported  Questions for the pharmacist: No    Confirmed patient received a Conservation officer, historic buildings and a Surveyor, mining with first shipment. The patient will receive a drug information handout for each medication shipped and additional FDA Medication Guides as required.       DISEASE/MEDICATION-SPECIFIC INFORMATION        N/A    SPECIALTY MEDICATION ADHERENCE     Medication Adherence    Patient reported X missed doses in the last month: 0  Specialty Medication: LENVIMA 18 mg/day (10 mg x 1-4 mg x2) Cap (lenvatinib)  Patient is on additional specialty medications: No  Patient is on more than two specialty medications: No  Any gaps in refill history greater than 2 weeks in the last 3 months: no  Demonstrates understanding of importance of adherence: yes  Informant: patient  Reliability of informant: reliable  Provider-estimated medication adherence level: good  Patient is at risk for Non-Adherence: No  Reasons for non-adherence: no problems identified  Confirmed plan for next specialty medication refill: delivery by pharmacy  Refills needed for supportive medications: not needed          Refill Coordination    Has the Patients' Contact Information Changed: No  Is the Shipping Address Different: No         Were doses missed due to medication being on hold? No    lenvima 18mg : 10 days of medicine on hand       REFERRAL TO PHARMACIST     Referral to the pharmacist: Not needed      [facility]     Shipping address confirmed in Epic.       Delivery Scheduled: Yes, Expected medication delivery date: 05/06.     Medication will be delivered via Same Day Courier to the prescription address in Epic WAM.    Paul Hines   [facility] Specialty Technician

## 2022-05-29 NOTE — Unmapped (Signed)
Patient [facility] Cancer Support Encounter    Mr. Paratore called the [facility]. Left message with volunteer. Called Mr. Dingus back x2 but received Call could not be completed at this time message.    Attempted to call Mr. Klapper back again x1. Allowed call to go through, but no answer and no option to leave voicemail.     [name], [name], RN, [facility]   She/Her/Hers  Oncology Nurse Navigator - [name] [facility]  [facility]  [address], [place], [place] [address]    Center: [phone]  Voicemail: [phone]  [email or web address]

## 2022-05-29 NOTE — Unmapped (Signed)
Patient [facility] Cancer Support Encounter    Mr. Giangregorio called back. Asked if I could review appointments with him. Reviewed upcoming appointments. Nettie Elm, community Child psychotherapist, driving Mr. Lodes to appointments tomorrow. Plans to take city bus to appointments on Wednesday.     [name], [name], RN, [facility]   She/Her/Hers  Oncology Nurse Navigator - [name] [facility]  [facility]  [address], [place], [place] [address]    Center: [phone]  Voicemail: [phone]  [email or web address]

## 2022-05-30 ENCOUNTER — Ambulatory Visit: Admit: 2022-05-30 | Discharge: 2022-05-30 | Payer: PRIVATE HEALTH INSURANCE

## 2022-05-30 ENCOUNTER — Ambulatory Visit
Admit: 2022-05-30 | Discharge: 2022-05-30 | Payer: PRIVATE HEALTH INSURANCE | Attending: Dermatology | Primary: Dermatology

## 2022-05-30 DIAGNOSIS — Z79899 Other long term (current) drug therapy: Principal | ICD-10-CM

## 2022-05-30 DIAGNOSIS — L732 Hidradenitis suppurativa: Principal | ICD-10-CM

## 2022-05-30 MED ORDER — CLINDAMYCIN 1 % LOTION
Freq: Two times a day (BID) | TOPICAL | 11 refills | 0.00000 days | Status: CP
Start: 2022-05-30 — End: ?

## 2022-05-30 MED ORDER — DOXYCYCLINE HYCLATE 100 MG TABLET
ORAL_TABLET | Freq: Two times a day (BID) | ORAL | 0 refills | 84.00000 days | Status: CP
Start: 2022-05-30 — End: 2022-08-22

## 2022-05-30 MED ADMIN — iohexol (OMNIPAQUE) 350 mg iodine/mL solution 75 mL: 75 mL | INTRAVENOUS | @ 16:00:00 | Stop: 2022-05-30

## 2022-05-30 NOTE — Unmapped (Unsigned)
Dermatology Note     Assessment and Plan:      Hidradenitis suppurativa, severe, Hurley III, Chronic: flared or not at treatment goal  Current stage 4 metastatic thyroid cancer being treated with lenvatinib   - We discussed the typical natural history, pathogenesis, treatment options, and expected course as well as the relapsing and sometimes recalcitrant nature of the disease and potential for sinus tract formation and scarring.    - We discussed associated comorbid conditions (pilonidal cysts, IBD, psoriasis, acne, dissecting cellulitis)  - Discussed that combination of medical and surgical therapy is often required for treatment.   - Reviewed association between HS and tobacco use; encouraged cessation  - Reviewed role of antibiotics in HS to decrease inflammation   - Discussed that the medication he is on for his metastatic thyroid cancer (lenvatinib) can sometimes cause lesions to flare  - Patient has a history of uncontrolled diabetes mellitus (most recent A1C 9.8 on 02/01/22), encouraged glycemic control as this can also help with disease activity  - Referring oncologist, Dr. Ginette Otto Hemant, ok for [place] to initiate Humira as in line w goals of care     Plan:  - Start adalimumab (initially denied by insurance, need hepatitis panel)   - We discussed risks associated with the medication including serious infections, injection site reactions, psoriasiform reactions, certain malignancies, lupus-like reactions and demyelinating disease, among others.  - HUMIRA PEN CITRATE FREE STARTER PACK FOR CROHN'S/UC/HS 3 X 80 MG/0.8 ML; 160 mg day 0, then 80 mg on day 14. Loading dose.  - adalimumab (HUMIRA PEN CROHNS-UC-HS START) 40 mg/0.8 mL injection PEN; Inject 0.8 mL (40 mg total) under the skin once a week. Maintenance dose.  - Continue doxycycline 100 mg BID PRN for flares, refilled today   - Discussed potential side effects including sun sensitivity, allergic reaction, GI upset including esophagitis and diarrhea and to stop the medication and call if these should occur.   - Start clindamycin (CLEOCIN T) 1 % lotion; Apply topically two (2) times a day.  - Continue Hibiclens wash qday in shower  - Start Vaseline as needed     High risk medication use, Humira  - Quantiferon TB negative March 2024  - Hepatitis panel ordered today       The patient was advised to call for an appointment should any new, changing, or symptomatic lesions develop.     RTC: Return in about 3 months (around 08/29/2022) for Recheck. or sooner as needed   _________________________________________________________________      Chief Complaint     Chief Complaint   Patient presents with    Follow-up     No new concerns, no flares        HPI     Paul Hines is a 52 y.o. male who presents as a returning patient (last seen 04/26/2022) to Dermatology for follow up of HS. At last visit, continued doxycycline and planned to start Humira.     Humira denied by insurance - needs hepatitis serologies    Today he reports:  - HS is the same, no new lesions, continues to flare in the same areas  - request refills of doxycycline  - would like to know if there is anything topical he could use    The patient denies any other new or changing lesions or areas of concern.     Pertinent Past Medical History         Past Medical History, Family History, Social History,  Medication List, Allergies, and Problem List were reviewed in the rooming section of Epic.     ROS: Other than symptoms mentioned in the HPI, no fevers, chills, or other skin complaints    Physical Examination     GENERAL: Well-appearing male in no acute distress, resting comfortably.  NEURO: Alert and oriented, answers questions appropriately  PSYCH: Normal mood and affect  RESP: No increased work of breathing  SKIN (Focal Skin Exam): Per patient request, examination of groin & buttocks was performed    Numerous inflamed nodules and tunnels to bilateral inguinal creases, groin, and buttocks with significant scarring     All areas not commented on are within normal limits or unremarkable      (Approved Template 10/13/2019)

## 2022-05-30 NOTE — Unmapped (Signed)
Pt has an appt on today

## 2022-05-31 ENCOUNTER — Ambulatory Visit: Admit: 2022-05-31 | Discharge: 2022-06-01 | Payer: PRIVATE HEALTH INSURANCE | Attending: Oncology | Primary: Oncology

## 2022-05-31 ENCOUNTER — Ambulatory Visit: Admit: 2022-05-31 | Discharge: 2022-06-01 | Payer: PRIVATE HEALTH INSURANCE

## 2022-05-31 ENCOUNTER — Ambulatory Visit
Admit: 2022-05-31 | Discharge: 2022-06-01 | Payer: PRIVATE HEALTH INSURANCE | Attending: Student in an Organized Health Care Education/Training Program | Primary: Student in an Organized Health Care Education/Training Program

## 2022-05-31 NOTE — Unmapped (Unsigned)
{** REMINDER - THIS NOTE IS NOT A FINAL MEDICAL RECORD UNTIL IT IS SIGNED.  UNTIL THEN, THE CONTENT BELOW MAY REFLECT INFORMATION FROM A DOCUMENTATION TEMPLATE, NOT THE ACTUAL PATIENT VISIT. **}       [facility]  PCP: Andree Coss, MD    Consulting providers:  Otolaryngology: Dr. 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 man with history of poorly controlled T2DM, hypertension, ischemic cardiac disease s/p catheterization in 2021 and metastatic thyroid cancer, s/p resection with Dr. Azucena Fallen on 04/12/20 and neck dissection on 07/28/20. Noted to have metastatic disease with pulmonary involvement on PET/CT (01/14/2022). Treating with 1L Lenvima 18 mg daily (02/21/21-current). Course has been complicated by poor compliance. He presents today in follow up on treatment to review scans.    # Metastatic Papillary Thyroid Cancer, BRAF WT  - Treatment goals are palliative.   - Tg: 75 (02/17/2020) --> 43 (06/24/2020) --> 212 (01/04/2021) --> 323 (02/01/21) ---> 502 (06/15/21) --> 662 (10/05/2021) --> 70 (02/01/2022) --> 107 (03/15/2022)  - Currently on Lenvima (02/21/21- current); notable for multiple missed doses  - CT Neck/Chest (05/30/2022): ***  - Continue Lenvima 18 mg daily  - Labs reviewed and acceptable  - We discussed the possibility of targeted RT to the sclerotic lesions within the right posterior 8th and 12th ribs; will reassess his pain level at next visit    # Hidradenitis suppurativa  - Poorly controlled, seen in ED 10/9 and prescribed clindamycin gel but not covered by insurance  - Followed by dermatology (Dr. Hayes Ludwig treated with 12 week course of doxycycline 100 mg BID + Hibiclens washes in the shower    # Hypertension, poor control  - 166/98, remains above goal today  - Current regimen includes amlodipine 10 mg daily, Lorsartan 100 mg and HCTZ 25 mg daily   - Encouraged medical compliance and frequent home checks  - Has been referred to [facility]    # Diabetes, ongoing hyperglycemia  - Poor control but improving, A1c 14.6% (11/17/21) down to 9.8% (02/01/22)  - Managing with metformin 1000 mg twice daily and Basaglar 20 units nightly  - We also discussed importance of compliance on insulin as well as frequent blood sugar checks  - [facility] referral as above    # Hypothyroidism,  -  TSH 45.4, FT4 0.52 (03/15/2022)  - Poor control due to lack of medication compliance  - Goal TSH <0.1  -  Continue synthroid 200 mcg once daily.    # Supportive care  - Cancer-related pain: Continue on Gabapentin 300mg  + Tramadol 50 mg q6hrs prn (script given today)  - Psychosocial: denies any need for CCSP at this time.   - Constipation: Recommended Miralax     Follow up: 6 weeks    Ginette Otto, DO, MPH  Head and Neck Medical Oncology  [facility]  --------------------------  Interval History  - ***    - Feeling tired  - He established care with dermatology two weeks ago and was prescribed a 12 week course of doxycycline for his HS. He has not yet noticed a significant relief  - Denies any other symptoms aside from fatigue as well as pain to his right flank and upper chest. This pain is intermittent, but can last for several days at a time. He has been taking tylenol and ibuprofen with  no relief  - The patient denies fevers, chills, abd pain, diarrhea, cramps, rash, or mucositis  - Has not been checking his blood sugars or blood pressures; continues to struggle with medication compliance  - Reports ~2 missed doses of lenvima per month  - He continues to live in [place] and presents to clinic by himself    History of Present Illness:  Paul Hines is a 52 y.o. male with history of T2DM, hypertension, ischemic heart disease s/p cardiac catheterization (07/2019) who presents for evaluation of head and neck cancer. I have reviewed his records including history, imaging, pathology reports, and, when applicable, operative notes and summarized his oncologic history below:     Patient was incidentally diagnosed on workup for chest pain in 2021. CTA cardiac (07/08/2019) showed bilateral pulmonary nodules and repeat imaging from 10/2019 and 12/2019 redemonstrated nodules. PET/CT obtained (10/2019) and was notable for hypermetabolic right hilar and mediastinal lymph nodes. FNA performed on 01/09/2020 to dominant right upper pulmonary nodule and confirmed papillary thyroid carcinoma. Further biopsy of lymph nodes were negative for malignancy.      He was evaluated by Dr. Azucena Fallen in February 2022. At that time he reports intermittent dysphagia, but was not significantly bothersome and did not limit his diet. He otherwise denied palpable neck mass, voice changes, otalgia or weight loss. Pre-treatment Tg was 75 in January 2022. Patient underwent total thyroidectomy on 04/12/2020 with bilateral neck dissection on 07/28/2020. Final pathology consistent with PTC, lymph node involvement (14/14) with positive ENE. Received adjuvant RAI (200 mCi I-131) on 05/20/2020.      PET/CT (01/14/2021) concerning for numerous pulmonary nodules that had increased in size when compared to prior imaging. Otherwise, no evidence of distant disease. Tumor marker was also noted to be elevated at 212 (01/04/2021).      02/01/2021: Present unaccompanied today, feeling well until recent illness as described above. He does report residual numbness all along the underside of his face. Previously smoked 2 packs of cigarettes per day for 14 years. Currently smoking ~3 cigarettes daily. He is not currently employed, but would like to work if possible. He was recently been released from prison and is currently staying with [facility].     Past Medical History:   Diagnosis Date    Abnormal findings on dx imaging of heart and cor circ     Acne vulgaris     Allergic contact dermatitis due to food in contact with skin     Atherosclerosis     Athscl heart disease of native coronary artery w/o ang pctrs     Chronic ischemic heart disease     Cutaneous abscess     Diabetic polyneuropathy (CMS-HCC)     Disorder of skin and subcutaneous tissue     Essential hypertension     H/O medication noncompliance     Hernia, inguinal, unilateral     Hypertrophic cardiomyopathy (CMS-HCC)     Malignant neoplasm of thyroid gland (CMS-HCC)     Presbyopia     Pseudofolliculitis barbae     Shoulder pain, bilateral     Tinea unguium     Type 2 diabetes mellitus (CMS-HCC)     Xerosis cutis        Past Surgical History:   Procedure Laterality Date    PR BRNCHSC EBUS GUIDED SAMPL 1/2 NODE STATION/STRUX N/A 01/09/2020    Procedure: BRONCH, RIGID OR FLEXIBLE, INC FLUORO GUIDANCE, WHEN PERFORMED; WITH EBUS GUIDED TRANSTRACHEAL AND/OR TRANSBRONCHIAL SAMPLING, ONE OR TWO MEDIASTINAL  AND/OR HILAR LYMPH NODE STATIONS OR STRUCTURES;  Surgeon: Jerelyn Charles, MD;  Location: [facility]IN [facility];  Service: Pulmonary    PR BRNSCHSC TNDSC EBUS DX/TX INTERVENTION PERPH LES Right 01/09/2020    Procedure: BRONCH, RIGID OR FLEXIBLE, INCLUDING FLUORO GUIDANCE, WHEN PERFORMED; WITH TRANSENDOSCOPIC EBUS DURING BRONCHOSCOPIC DIAGNOSTIC OR THERAPEUTIC INTERVENTION(S) FOR PERIPHERAL LESION(S);  Surgeon: Jerelyn Charles, MD;  Location: MAIN OR [place];  Service: Pulmonary    PR BRONCHOSCOPY,COMPUTER ASSIST/IMAGE-GUIDED NAVIGATION Right 01/09/2020    Procedure: ROBOT ION BRONCHOSCOPY,RIGID OR FLEXIBLE,INCLUDE FLUORO WHEN PERFORMED; W/COMPUTER-ASSIST,IMAGE-GUIDED NAVIGATION;  Surgeon: Jerelyn Charles, MD;  Location: MAIN OR [place];  Service: Pulmonary    PR BRONCHOSCOPY,DIAGNOSTIC W LAVAGE Right 01/09/2020    Procedure: BRONCHOSCOPY, RIGID OR FLEXIBLE, INCLUDE FLUOROSCOPIC GUIDANCE WHEN PERFORMED; W/BRONCHIAL ALVEOLAR LAVAGE;  Surgeon: Jerelyn Charles, MD;  Location: MAIN OR [place];  Service: Pulmonary    PR BRONCHOSCOPY,TRANSBRON ASPIR BX Right 01/09/2020    Procedure: BRONCHOSCOPY, RIGID/FLEX, INCL FLUORO; W/TRANSBRONCH NDL ASPIRAT BX, TRACHEA, MAIN STEM &/OR LOBAR BRONCHUS;  Surgeon: Jerelyn Charles, MD;  Location: MAIN OR [place];  Service: Pulmonary    PR BRONCHOSCOPY,TRANSBRONCH BIOPSY Right 01/09/2020    Procedure: BRONCHOSCOPY, RIGID/FLEXIBLE, INCLUDE FLUORO GUIDANCE WHEN PERFORMED; W/TRANSBRONCHIAL LUNG BX, SINGLE LOBE;  Surgeon: Jerelyn Charles, MD;  Location: MAIN OR [facility];  Service: Pulmonary    PR CATH PLACE/CORON ANGIO, IMG SUPER/INTERP,W LEFT HEART VENTRICULOGRAPHY N/A 08/26/2019    Procedure: Left Heart Catheterization;  Surgeon: Marlaine Hind, MD;  Location: [facility] CATH;  Service: Cardiology    PR LIGATN THOR DUCT,CERV APPROACH Bilateral 07/28/2020    Procedure: SUTURE &/OR LIG THORACIC DUCT; CERV APPROACH;  Surgeon: Lauralee Evener, MD;  Location: MAIN OR [facility];  Service: ENT    PR REMOVAL NODES, NECK,CERV MOD RAD Bilateral 07/28/2020    Procedure: CERVICAL LYMPHADENECTOMY (MODIFIED RADICAL NECK DISSECTION);  Surgeon: Lauralee Evener, MD;  [facility]: [facility];  Service: ENT    PR THYROIDECTOMY,MALIG,LTD NECK SURG Bilateral 04/12/2020    Procedure: THYROIDECTOMY, TOTAL OR SUBTOTAL FOR MALIGNANCY; WITH LIMITED NECK DISSECTION;  Surgeon: Lauralee Evener, MD;  Location: MAIN OR [facility];  Service: ENT       Current Outpatient Medications   Medication Sig Dispense Refill    adalimumab (HUMIRA PEN CROHNS-UC-HS START) 40 mg/0.8 mL injection PEN Inject the contents of 1 pen (40 mg total) under the skin once a week. Maintenance dose 4 each 5    amlodipine (NORVASC) 10 MG tablet Take 1 tablet (10 mg total) by mouth daily. 90 tablet 3    atorvastatin (LIPITOR) 80 MG tablet Take 1 tablet (80 mg total) by mouth nightly. 90 tablet 2    blood sugar diagnostic (ACCU-CHEK GUIDE TEST STRIPS) Strp Use to check blood glucose once daily. (Patient not taking: Reported on 04/19/2022) 100 strip 1    blood-glucose meter kit Test blood glucose level once daily. (Patient not taking: Reported on 04/19/2022) 1 each 0 clindamycin (CLEOCIN T) 1 % lotion Apply topically two (2) times a day. 60 mL 11    doxycycline (VIBRA-TABS) 100 MG tablet Take 1 tablet (100 mg total) by mouth two (2) times a day. Take with food. 168 tablet 0    gabapentin (NEURONTIN) 300 MG capsule Take 1 capsule (300 mg total) by mouth two (2) times a day. 180 capsule 2    HUMIRA PEN CITRATE FREE STARTER PACK FOR CROHN'S/UC/HS 3 X 80 MG/0.8 ML Inject the contents of 2 pens (160 mg) on day 1, THEN  1 pen (80 mg) on day 15. 3 each 0    hydroCHLOROthiazide (HYDRODIURIL) 25 MG tablet Take 1 tablet (25 mg total) by mouth daily. 90 tablet 1    ibuprofen (MOTRIN) 600 MG tablet Take 1 tablet (600 mg total) by mouth Three (3) times a day as needed for pain. 30 tablet 1    insulin glargine (BASAGLAR, LANTUS) 100 unit/mL (3 mL) injection pen Inject 0.3 mL (30 Units total) under the skin nightly. 15 mL 2    lancets Misc Use to check blood glucose once daily. (Patient not taking: Reported on 04/19/2022) 100 each 1    lenvatinib (LENVIMA) 18 mg/day (10 mg x 1 AND 4 mg x 2) cap Take one (10mg ) capsule plus two (4mg ) capsules by mouth daily for a total of 18mg  daily. 90 capsule 2    levothyroxine (SYNTHROID) 125 MCG tablet Take 2 tablets (250 mcg total) by mouth daily. 180 tablet 3    losartan (COZAAR) 100 MG tablet Take 1 tablet (100 mg total) by mouth daily. 90 tablet 1    losartan-hydroCHLOROthiazide (HYZAAR) 100-25 mg per tablet Take 1 tablet by mouth daily. 90 tablet 3    metFORMIN (GLUCOPHAGE) 500 MG tablet Take 2 tablets (1,000 mg total) by mouth in the morning and 2 tablets (1,000 mg total) in the evening. Take with meals. 360 tablet 2    pen needle, diabetic 32 gauge x 5/32 (4 mm) Ndle Use with insulin glargine once daily (Patient not taking: Reported on 04/19/2022) 100 each 1    spironolactone (ALDACTONE) 25 MG tablet Take 1 tablet (25 mg total) by mouth daily. 90 tablet 3    traMADol (ULTRAM) 50 mg tablet Take 1 tablet (50 mg total) by mouth every six (6) hours as needed for pain. 20 tablet 0     No current facility-administered medications for this visit.       Allergies   Allergen Reactions    Penicillin Anaphylaxis    Fish Derived     Lisinopril     Peanut Butter Flavor      Per allergy testing    Penicillins Other (See Comments)     Per patient, when he took at age 62 his legs and arms shook. Not sure if it was seizure or not.     Shellfish Containing Products     Soy        Social History     Tobacco Use    Smoking status: Some Days     Types: Cigarettes     Passive exposure: Current    Smokeless tobacco: Never   Vaping Use    Vaping status: Never Used   Substance Use Topics    Alcohol use: Yes    Drug use: Not Currently     Types: Marijuana     Comment: quit in 1992       Social History     Social History Narrative    Not on file       Family History   Problem Relation Age of Onset    Melanoma Neg Hx     Basal cell carcinoma Neg Hx     Squamous cell carcinoma Neg Hx        Review of Systems: A 12-system review of systems was obtained including: Constitutional, Eyes, ENT, Cardiovascular, Respiratory, GI, GU, Musculoskeletal, Skin, Neurological, Psychiatric, Endocrine, Heme/Lymphatic, and Allergic/Immunologic systems. It is negative or non-contributory to the patient???s management except for as stated in patient's HPI  ECOG PS: 0    Physical Examination:  Vital Signs: There were no vitals taken for this visit.  CONSTITUTIONAL: Well appearing man in NAD  Oral Cavity: MMM, no oral lesions or mucositis  Lymphatics: No lymphadenopathy   CV: RRR; no lower extremity edema  RESP: normal work of breathing  GI: Soft, non-tender, non-distended  SKIN: No skin rashes; groin not examined today  NEURO: no focal deficits appreciated  PSYCH: Normal mood and appropriate affect    LABS  Lab Results   Component Value Date    WBC 7.0 04/19/2022    HGB 12.0 (L) 04/19/2022    HCT 36.8 (L) 04/19/2022    PLT 312 04/19/2022       Lab Results   Component Value Date    NA 136 04/19/2022    K 3.8 04/19/2022    CL 103 04/19/2022    CO2 27.0 04/19/2022    BUN 11 04/19/2022    CREATININE 1.3 05/30/2022    GLU 425 (HH) 04/19/2022    CALCIUM 8.4 (L) 04/19/2022    MG 1.6 07/31/2020    PHOS 3.0 07/31/2020       Lab Results   Component Value Date    BILITOT 0.2 (L) 04/19/2022    PROT 7.2 04/19/2022    ALBUMIN 2.9 (L) 04/19/2022    ALT 26 04/19/2022    AST 38 (H) 04/19/2022    ALKPHOS 75 04/19/2022       No results found for: PT, INR, APTT      IMAGING  CT Neck (05/30/2022)  -- Asymmetric enlargement of the right palatine tonsil abutting the uvula without discrete mass identified, nonspecific could be secondary to patient positioning. Consider further evaluation with direct visualization or PET CT.  -- No definite recurrent disease within the soft tissues of the neck.  -- Similar appearance of the bilateral pulmonary metastases.    CT Chest (05/30/2022)  New subcentimeter pulmonary nodules in the right lower lobe which could represent metastases. Other previously present numerous bilateral metastatic nodules are unchanged.     PATHOLOGY  THYROID GLAND  8th Edition - Protocol posted: 03/27/2018   THYROID GLAND: RESECTION - All Specimens  Clinical History  No known radiation exposure    SPECIMEN   Procedure  Total thyroidectomy    TUMOR   Tumor Focality  Multifocal    Tumor Characteristics     Tumor Site  Right lobe      Left lobe      Isthmus    Histologic Type  Papillary carcinoma, classic (usual, conventional)    Tumor Size  Greatest Dimension (Centimeters): 3.1 cm   Extrathyroidal Extension  Not identified    Angioinvasion (vascular invasion)  Not identified    Lymphatic Invasion  Not identified    Perineural Invasion  Not identified    Margins  Uninvolved by carcinoma    Distance of Invasive Carcinoma from Closest Margin (Millimeters)  1 mm   Mitotic Rate  4 Mitoses per 2 mm^2   LYMPH NODES   Number of Lymph Nodes Involved  14    Nodal Levels Involved  Level VI    Size of Largest Metastatic Deposit (Centimeters)  5 cm   Extranodal Extension (ENE)  Present    Number of Lymph Nodes Examined  14    Nodal Levels Examined  Level VI    PATHOLOGIC STAGE CLASSIFICATION (pTNM, AJCC 8th Edition)   TNM Descriptors  m (multiple primary tumors)  Primary Tumor (pT)  pT2    Regional Lymph Nodes (pN)  pN1a    Distant Metastasis (pM)  pM1    Site(s)  lung see [id number]      I have personally reviewed relevant imaging, laboratory values, existing medical records, and pathology. I have summarized these findings in the oncology history above.    ***

## 2022-06-01 NOTE — Unmapped (Signed)
Nurse initiated PA via CMM; pending approval/denial.    STATUS:     Paul Hines (Key: [id number])  Need help? Call us at [phone]    Status   Sent to [employer]  Next Steps   The plan will fax you a determination, typically within 1 to 5 business days.

## 2022-06-02 DIAGNOSIS — L732 Hidradenitis suppurativa: Principal | ICD-10-CM

## 2022-06-02 LAB — HEPATITIS B CORE ANTIBODY, TOTAL: HEPATITIS B CORE TOTAL ANTIBODY: NONREACTIVE

## 2022-06-02 LAB — HEPATITIS C ANTIBODY: HEPATITIS C ANTIBODY: NONREACTIVE

## 2022-06-02 LAB — HEPATITIS B SURFACE ANTIBODY
HEPATITIS B SURFACE ANTIBODY QUANT: <8 m[IU]/mL (ref <8.00)
HEPATITIS B SURFACE ANTIBODY: NONREACTIVE

## 2022-06-02 LAB — HEPATITIS B SURFACE ANTIGEN: HEPATITIS B SURFACE ANTIGEN: NONREACTIVE

## 2022-06-02 MED ORDER — HUMIRA PEN CROHN'S-ULC COLITIS-HID SUP STARTER 40 MG/0.8 ML SUBCUT KIT
SUBCUTANEOUS | 5 refills | 28.00000 days | Status: CP
Start: 2022-06-02 — End: 2022-06-02
  Filled 2022-06-12: qty 3, 28d supply, fill #0

## 2022-06-02 MED ORDER — HUMIRA PEN CITRATE FREE STARTER PACK FOR CROHN'S/UC/HS 3 X 80 MG/0.8 ML
ORAL | 0 refills | 0.00000 days | Status: CP
Start: 2022-06-02 — End: 2022-06-02

## 2022-06-05 DIAGNOSIS — L732 Hidradenitis suppurativa: Principal | ICD-10-CM

## 2022-06-07 NOTE — Unmapped (Signed)
Patient [facility] Cancer Support Encounter    Called Mr. Bergsten to follow-up on no show to appts on 5/1. Mr. Weisheit reports that his neighbors kept him up all night and he missed his bus to get here. Attempted to connect with surgical oncology team with Mr. Wickwire on the line. Rang for several minutes with no answer. Sent Epic chat to schedulers asking for assistance rescheduling. Mr. Livaudais called back to say that the schedulers called him to reschedule. Discussed upcoming appointments (new surg-onc appointments not yet in schedule). Mr. Hammermeister plans to call back tomorrow to review appointments once they are changed.     [name], [name], RN, [facility]   She/Her/Hers  Oncology Nurse Navigator - [name] [facility]  [facility]  [address], [place], [place] [address]    Center: [phone]  Voicemail: [phone]  [email or web address]

## 2022-06-07 NOTE — Unmapped (Signed)
[facility] Specialty Medication Onboarding    Specialty Medication: Humira  Prior Authorization: Approved   Financial Assistance: No - copay  <$25  Final Copay/Day Supply: $4 / 15 days (LD)          $4 / 28 days (MD)    Insurance Restrictions: None     Notes to Pharmacist:   Credit Card on File: no    The triage team has completed the benefits investigation and has determined that the patient is able to fill this medication at  [facility]. Please contact the patient to complete the onboarding or follow up with the prescribing physician as needed.

## 2022-06-08 NOTE — Unmapped (Signed)
Patient [facility] Cancer Support Encounter    Mr. Gambale called the Patient & [facility] asking for assistance with contacting the surg-onc scheduler and [facility].     Conference called April Jones, who was able to reschedule Mr. Almon's appointments. Told Mr. Walkenhorst that I can call Nettie Elm, community social worker, on Monday to discuss arranging rides for upcoming appointments.     Then conference called the [facility] to discuss Humira shipment. Needs to have a consultation with the pharmacist, who was unavailable at this time. Arranged a phone call at 10:00am tomorrow. Mr. Shiplett verbalized that 10:00am will be a good time for him and he will be awake by then. Told that shipment will be arranged after consultation.     [name], [name], RN, [facility]   She/Her/Hers  Oncology Nurse Navigator - [name] [facility]  [facility]  [address] [place], [place] [address]    Center: [phone]  Voicemail: [phone]  [email or web address]

## 2022-06-08 NOTE — Unmapped (Unsigned)
*incomplete documentation, called 5/9 but unable to leave VM. Will attempt call later as well. mas    [facility]   Patient Onboarding/Medication Counseling    Paul Hines is a 52 y.o. male with hidradenitis suppurativa who I am counseling today on initiation of therapy.  I am speaking to the patient.    Was a Nurse, learning disability used for this call? No    Verified patient's date of birth / HIPAA.    Specialty medication(s) to be sent: Inflammatory Disorders: Humira      Non-specialty medications/supplies to be sent: sharps kit      Medications not needed at this time: na         Humira (adalimumab)    Medication & Administration     Dosage: Hidradenitis supprativa: Inject 160mg  under the skin on day 1, 80mg  on day 15, then 40mg  every 7 days starting on day 29    Lab tests required prior to treatment initiation:  Tuberculosis: Tuberculosis screening resulted in a non-reactive Quantiferon TB Gold assay.  Hepatitis B: Hepatitis B serology studies are complete and non-reactive.    Administration:     Prefilled auto-injector pen  1. Gather all supplies needed for injection on a clean, flat working surface: medication pen removed from packaging, alcohol swab, sharps container, etc.  2. Look at the medication label - look for correct medication, correct dose, and check the expiration date  3. Look at the medication - the liquid visible in the window on the side of the pen device should appear clear and colorless  4. Lay the auto-injector pen on a flat surface and allow it to warm up to room temperature for at least 30-45 minutes  5. Select injection site - you can use the front of your thigh or your belly (but not the area 2 inches around your belly button); if someone else is giving you the injection you can also use your upper arm in the skin covering your triceps muscle  6. Prepare injection site - wash your hands and clean the skin at the injection site with an alcohol swab and let it air dry, do not touch the injection site again before the injection  7. Pull the 2 safety caps straight off - gray/white to uncover the needle cover and the plum cap to uncover the plum activator button, do not remove until immediately prior to injection and do not touch the white needle cover  8. Gently squeeze the area of cleaned skin and hold it firmly to create a firm surface at the selected injection site  9. Put the white needle cover against your skin at the injection site at a 90 degree angle, hold the pen such that you can see the clear medication window  10. Press down and hold the pen firmly against your skin, press the plum activator button to initiate the injection, there will be a click when the injection starts  11. Continue to hold the pen firmly against your skin for about 10-15 seconds - the window will start to turn solid yellow  12. To verify the injection is complete after 10-15 seconds, look and ensure the window is solid yellow and then pull the pen away from your skin  13. Dispose of the used auto-injector pen immediately in your sharps disposal container the needle will be covered automatically  14. If you see any blood at the injection site, press a cotton ball or gauze on the site and maintain pressure until  the bleeding stops, do not rub the injection site    Adherence/Missed dose instructions:  If your injection is given more than 3 days after your scheduled injection date - consult your pharmacist for additional instructions on how to adjust your dosing schedule.    Goals of Therapy     - Reduce the frequency and severity of new lesions  - Minimize pain and suppuration  - Prevent disease progression and limit scarring  - Maintenance of effective psychosocial functioning    Side Effects & Monitoring Parameters     Injection site reaction (redness, irritation, inflammation localized to the site of administration)  Signs of a common cold - minor sore throat, runny or stuffy nose, etc.  Upset nose, etc.  Upset stomach  Headache    The following side effects should be reported to the provider:  Signs of a hypersensitivity reaction - rash; hives; itching; red, swollen, blistered, or peeling skin; wheezing; tightness in the chest or throat; difficulty breathing, swallowing, or talking; swelling of the mouth, face, lips, tongue, or throat; etc.  Reduced immune function - report signs of infection such as fever; chills; body aches; very bad sore throat; ear or sinus pain; cough; more sputum or change in color of sputum; pain with passing urine; wound that will not heal, etc.  Also at a slightly higher risk of some malignancies (mainly skin and blood cancers) due to this reduced immune function.  In the case of signs of infection - the patient should hold the next dose of Humira?? and call your primary care provider to ensure adequate medical care.  Treatment may be resumed when infection is treated and patient is asymptomatic.  Changes in skin - a new growth or lump that forms; changes in shape, size, or color of a previous mole or marking  Signs of unexplained bruising or bleeding - throwing up blood or emesis that looks like coffee grounds; black, tarry, or bloody stool; etc.  Signs of new or worsening heart failure - shortness of breath; sudden weight gain; heartbeat that is not normal; swelling in the arms or legs that is new or worse      Contraindications, Warnings, & Precautions     Have your bloodwork checked as you have been told by your prescriber  Talk with your doctor if you are pregnant, planning to become pregnant, or breastfeeding  Discuss the possible need for holding your dose(s) of Humira?? when a planned procedure is scheduled with the prescriber as it may delay healing/recovery timeline       Drug/Food Interactions     Medication list reviewed in Epic. The patient was instructed to inform the care team before taking any new medications or supplements. No drug interactions identified.   Talk identified,***}.   Talk with you prescriber or pharmacist before receiving any live vaccinations while taking this medication and after you stop taking it    Storage, Handling Precautions, & Disposal     Store this medication in the refrigerator.  Do not freeze  If needed, you may store at room temperature for up to 14 days  Store in original packaging, protected from light  Do not shake  Dispose of used syringes/pens in a sharps disposal container          Current Medications (including OTC/herbals), Comorbidities and Allergies     Current Outpatient Medications   Medication Sig Dispense Refill    adalimumab (HUMIRA PEN CROHNS-UC-HS START) 40 mg/0.8 mL injection PEN Inject the contents  of 1 pen (40 mg total) under the skin once a week. Maintenance dose 4 each 5    amlodipine (NORVASC) 10 MG tablet Take 1 tablet (10 mg total) by mouth daily. 90 tablet 3    atorvastatin (LIPITOR) 80 MG tablet Take 1 tablet (80 mg total) by mouth nightly. 90 tablet 2    blood sugar diagnostic (ACCU-CHEK GUIDE TEST STRIPS) Strp Use to check blood glucose once daily. (Patient not taking: Reported on 04/19/2022) 100 strip 1    blood-glucose meter kit Test blood glucose level once daily. (Patient not taking: Reported on 04/19/2022) 1 each 0    clindamycin (CLEOCIN T) 1 % lotion Apply topically two (2) times a day. 60 mL 11    doxycycline (VIBRA-TABS) 100 MG tablet Take 1 tablet (100 mg total) by mouth two (2) times a day. Take with food. 168 tablet 0    gabapentin (NEURONTIN) 300 MG capsule Take 1 capsule (300 mg total) by mouth two (2) times a day. 180 capsule 2    HUMIRA PEN CITRATE FREE STARTER PACK FOR CROHN'S/UC/HS 3 X 80 MG/0.8 ML Inject the contents of 2 pens (160 mg) on day 1, THEN 1 pen (80 mg) on day 15. 3 each 0    hydroCHLOROthiazide (HYDRODIURIL) 25 MG tablet Take 1 tablet (25 mg total) by mouth daily. 90 tablet 1    ibuprofen (MOTRIN) 600 MG tablet Take 1 tablet (600 mg total) by mouth Three (3) times a day as needed for pain. 30 tablet 1    insulin glargine (BASAGLAR, LANTUS) 100 unit/mL (3 mL) injection pen Inject 0.3 mL (30 Units total) under the skin nightly. 15 mL 2    lancets Misc Use to check blood glucose once daily. (Patient not taking: Reported on 04/19/2022) 100 each 1    lenvatinib (LENVIMA) 18 mg/day (10 mg x 1 AND 4 mg x 2) cap Take one (10mg ) capsule plus two (4mg ) capsules by mouth daily for a total of 18mg  daily. 90 capsule 2    levothyroxine (SYNTHROID) 125 MCG tablet Take 2 tablets (250 mcg total) by mouth daily. 180 tablet 3    losartan (COZAAR) 100 MG tablet Take 1 tablet (100 mg total) by mouth daily. 90 tablet 1    losartan-hydroCHLOROthiazide (HYZAAR) 100-25 mg per tablet Take 1 tablet by mouth daily. 90 tablet 3    metFORMIN (GLUCOPHAGE) 500 MG tablet Take 2 tablets (1,000 mg total) by mouth in the morning and 2 tablets (1,000 mg total) in the evening. Take with meals. 360 tablet 2    pen needle, diabetic 32 gauge x 5/32 (4 mm) Ndle Use with insulin glargine once daily (Patient not taking: Reported on 04/19/2022) 100 each 1    spironolactone (ALDACTONE) 25 MG tablet Take 1 tablet (25 mg total) by mouth daily. 90 tablet 3    traMADol (ULTRAM) 50 mg tablet Take 1 tablet (50 mg total) by mouth every six (6) hours as needed for pain. 20 tablet 0     No current facility-administered medications for this visit.       Allergies   Allergen Reactions    Penicillin Anaphylaxis    Fish Derived     Lisinopril     Peanut Butter Flavor      Per allergy testing    Penicillins Other (See Comments)     Per patient, when he took at age 49 his legs and arms shook. Not sure if it was seizure or not.  Shellfish Containing Products     Soy        Patient Active Problem List   Diagnosis    Abnormal computed tomography angiography (CTA)    Thyroid cancer (CMS-HCC)    Obesity    Right inguinal hernia    Diabetes (CMS-HCC)    Essential hypertension    Hidradenitis suppurativa    Postoperative hypothyroidism       Reviewed and up to date in Epic.    Appropriateness of Therapy     Acute infections noted within Epic:  No active infections  Patient reported infection: {Blank single:19197::None,***- patient reported to provider,***- pharmacy reported to provider}    Is medication and dose appropriate based on diagnosis and infection status? {Blank single:19197::Yes,No - evidence provided by prescriber in *** note}    Prescription has been clinically reviewed: {Blank single:19197::Yes,***}      Baseline Quality of Life Assessment      {DiseaseSpecificQOL:73897}    Financial Information     Medication Assistance provided: {sscmedassist:65303}    Anticipated copay of $*** reviewed with patient. Verified delivery address.    Delivery Information     Scheduled delivery date: ***    Expected start date: ***      Medication will be delivered via {Blank:19197::UPS,Next Day Courier,Same Day Courier,Clinic Courier - *** clinic,***} to the {Blank:19197::prescription,temporary} address in Epic WAM.  This shipment {Blank single:19197::will,will not} require a signature.      Explained the services we provide at [facility] and that each month we would call to set up refills.  Stressed importance of returning phone calls so that we could ensure they receive their medications in time each month.  Informed patient that we should be setting up refills 7-10 days prior to when they will run out of medication.  A pharmacist will reach out to perform a clinical assessment periodically.  Informed patient that a welcome packet, containing information about our pharmacy and other support services, a Notice of Privacy Practices, and a drug information handout will be sent.      The patient or caregiver noted above participated in the development of this care plan and knows that they can request review of or adjustments to the care plan at any time.      Patient or caregiver verbalized understanding of the above information as well as how to contact the pharmacy at [phone] option 4 with any questions/concerns.  The pharmacy is open Monday through Friday 8:30am-4:30pm.  A pharmacist is available 24/7 via pager to answer any clinical questions they may have.    Patient Specific Needs     Does the patient have any physical, cognitive, or cultural barriers? {Blank single:19197::No,Yes - ***}    Does the patient have adequate living arrangements? (i.e. the ability to store and take their medication appropriately) {Blank single:19197::Yes,No - ***}    Did you identify any home environmental safety or security hazards? {Blank single:19197::No,Yes - ***}    Patient prefers to have medications discussed with  {Blank single:19197::Patient,Family Member,Caregiver,Other}     Is the patient or caregiver able to read and understand education materials at a high school level or above? {Blank single:19197::No,Yes}    Patient's primary language is  {Blank single:19197::English,Spanish,***}     Is the patient high risk? {sschighriskpts:78327}    SOCIAL DETERMINANTS OF HEALTH     At the [facility], we have learned that life circumstances - like trouble affording food, housing, utilities, or transportation can affect the  health of many of our patients.   That is why we wanted to ask: are you currently experiencing any life circumstances that are negatively impacting your health and/or quality of life? {YES/NO/PATIENTDECLINED:93004}    Social Determinants of Health     Financial Resource Strain: Low Risk  (07/29/2020)    Overall Financial Resource Strain (CARDIA)     Difficulty of Paying Living Expenses: Not hard at all   Internet Connectivity: Not on file   Food Insecurity: No Food Insecurity (07/29/2020)    Hunger Vital Sign     Worried About Running Out of Food in the Last Year: Never true     Ran Out of Food in the Last Year: Never true   Tobacco Use: High Risk (05/30/2022)    Patient History Smoking Tobacco Use: Some Days     Smokeless Tobacco Use: Never     Passive Exposure: Current   Housing/Utilities: Low Risk  (07/29/2020)    Housing/Utilities     Within the past 12 months, have you ever stayed: outside, in a car, in a tent, in an overnight shelter, or temporarily in someone else's home (i.e. couch-surfing)?: No     Are you worried about losing your housing?: No     Within the past 12 months, have you been unable to get utilities (heat, electricity) when it was really needed?: No   Alcohol Use: Alcohol Misuse (08/22/2021)    Received from [facility]    AUDIT-C     Frequency of Alcohol Consumption: 2-3 times a week     Average Number of Drinks: 5 or 6     Frequency of Binge Drinking: Weekly   Transportation Needs: Unmet Transportation Needs (05/26/2022)    PRAPARE - Transportation     Lack of Transportation (Medical): Yes     Lack of Transportation (Non-Medical): Not on file   Substance Use: Not on file   Health Literacy: Not on file   Physical Activity: Not on file   Interpersonal Safety: Not on file   Stress: Not on file   Intimate Partner Violence: Not on file   Depression: Not at risk (04/19/2022)    PHQ-2     PHQ-2 Score: 0   Social Connections: Not on file       Would you be willing to receive help with any of the needs that you have identified today? {Yes/No/Not applicable:93005}       Paul Hines A Desiree Lucy [facility] Specialty Pharmacist

## 2022-06-09 MED ORDER — EMPTY CONTAINER
2 refills | 0 days
Start: 2022-06-09 — End: ?

## 2022-06-12 MED FILL — EMPTY CONTAINER: 120 days supply | Qty: 1 | Fill #0

## 2022-06-12 NOTE — Unmapped (Signed)
[facility] Cancer Support Encounter    Paul Hines,community social worker, called [facility] back to discuss upcoming appointments and arrange transportation for Paul Hines. Reviewed appointment times and locations with Paul Hines with Mr. [facility] permission.    [name], [name], RN, [facility]   She/Her/Hers  Oncology Nurse Navigator - [name] [facility]  [facility]  [address], [place], [place] [address]    Center: [phone]  Voicemail: [phone]  [email or web address]

## 2022-06-12 NOTE — Unmapped (Signed)
Patient [facility] Cancer Support Encounter    Called Nettie Elm, community Child psychotherapist, to discuss upcoming appointments and arrange transportation. Nettie Elm was driving and asked to call back to discuss. Nettie Elm has my direct callback number.     [name], [name], RN, [facility]   She/Her/Hers  Oncology Nurse Navigator - [name] [facility]  [address] [place], [place] [address]    Center: [phone]  Voicemail: [phone]  [email or web address]

## 2022-06-13 NOTE — Unmapped (Signed)
[facility] Clinical Intervention    Type of intervention: Medication administration    Medication involved: Humira    Problem identified: Patient states he was half asleep when he reviewed injection technique with [place] and would like to review again    Intervention performed: Patient stated he did not have internet to look up the video that [facility] sent.  Reviewed injection training as stated on previous onboarding.  Patient verified understanding on dosing and how to inject.     Follow-up needed: n/a    Approximate time spent: 5-10 minutes    Clinical evidence used to support intervention: Drug information resource    Result of the intervention: Improved medication adherence    Julianne Rice, PharmD   [facility] Specialty Pharmacist

## 2022-06-15 NOTE — Unmapped (Signed)
Patient [facility] Cancer Support Encounter    Mr. Harden called the Patient & [facility]. Provided active listening and emotional support as patient discussed current needs and challenges. Reviewed upcoming appointments and rides with Nettie Elm. Mr. Qamar reports that he was able to get all his questions from the pharmacist answered the other day.       [name], [name], RN, [facility]   She/Her/Hers  Oncology Nurse Navigator - [name] [facility]  [facility]  [address] [place], [place] [address]    Center: [phone]  Voicemail: [phone]  [email or web address]

## 2022-06-20 ENCOUNTER — Ambulatory Visit: Admit: 2022-06-20 | Discharge: 2022-06-20 | Payer: PRIVATE HEALTH INSURANCE | Attending: Oncology | Primary: Oncology

## 2022-06-20 ENCOUNTER — Ambulatory Visit
Admit: 2022-06-20 | Discharge: 2022-06-20 | Payer: PRIVATE HEALTH INSURANCE | Attending: Student in an Organized Health Care Education/Training Program | Primary: Student in an Organized Health Care Education/Training Program

## 2022-06-20 ENCOUNTER — Other Ambulatory Visit: Admit: 2022-06-20 | Discharge: 2022-06-20 | Payer: PRIVATE HEALTH INSURANCE

## 2022-06-20 DIAGNOSIS — C73 Malignant neoplasm of thyroid gland: Principal | ICD-10-CM

## 2022-06-20 DIAGNOSIS — E119 Type 2 diabetes mellitus without complications: Principal | ICD-10-CM

## 2022-06-20 DIAGNOSIS — Z794 Long term (current) use of insulin: Principal | ICD-10-CM

## 2022-06-20 LAB — CBC W/ AUTO DIFF
BASOPHILS ABSOLUTE COUNT: 0.1 10*9/L (ref 0.0–0.1)
BASOPHILS RELATIVE PERCENT: 1.2 %
EOSINOPHILS ABSOLUTE COUNT: 0.1 10*9/L (ref 0.0–0.5)
EOSINOPHILS RELATIVE PERCENT: 2.8 %
HEMATOCRIT: 41.5 % (ref 39.0–48.0)
HEMOGLOBIN: 13.7 g/dL (ref 12.9–16.5)
LYMPHOCYTES ABSOLUTE COUNT: 2 10*9/L (ref 1.1–3.6)
LYMPHOCYTES RELATIVE PERCENT: 38.4 %
MEAN CORPUSCULAR HEMOGLOBIN CONC: 33 g/dL (ref 32.0–36.0)
MEAN CORPUSCULAR HEMOGLOBIN: 24.3 pg — ABNORMAL LOW (ref 25.9–32.4)
MEAN CORPUSCULAR VOLUME: 73.8 fL — ABNORMAL LOW (ref 77.6–95.7)
MEAN PLATELET VOLUME: 8.4 fL (ref 6.8–10.7)
MONOCYTES ABSOLUTE COUNT: 0.3 10*9/L (ref 0.3–0.8)
MONOCYTES RELATIVE PERCENT: 6.1 %
NEUTROPHILS ABSOLUTE COUNT: 2.6 10*9/L (ref 1.8–7.8)
NEUTROPHILS RELATIVE PERCENT: 51.5 %
PLATELET COUNT: 254 10*9/L (ref 150–450)
RED BLOOD CELL COUNT: 5.61 10*12/L — ABNORMAL HIGH (ref 4.26–5.60)
RED CELL DISTRIBUTION WIDTH: 16 % — ABNORMAL HIGH (ref 12.2–15.2)
WBC ADJUSTED: 5.1 10*9/L (ref 3.6–11.2)

## 2022-06-20 LAB — COMPREHENSIVE METABOLIC PANEL
ALBUMIN: 3.5 g/dL (ref 3.4–5.0)
ALKALINE PHOSPHATASE: 94 U/L (ref 46–116)
ALT (SGPT): 26 U/L (ref 10–49)
ANION GAP: 7 mmol/L (ref 5–14)
AST (SGOT): 44 U/L — ABNORMAL HIGH (ref <=34)
BILIRUBIN TOTAL: 0.3 mg/dL (ref 0.3–1.2)
BLOOD UREA NITROGEN: 17 mg/dL (ref 9–23)
BUN / CREAT RATIO: 17
CALCIUM: 9.3 mg/dL (ref 8.7–10.4)
CHLORIDE: 99 mmol/L (ref 98–107)
CO2: 30 mmol/L (ref 20.0–31.0)
CREATININE: 0.98 mg/dL
EGFR CKD-EPI (2021) MALE: >90 mL/min/{1.73_m2} (ref >=60)
GLUCOSE RANDOM: 267 mg/dL — ABNORMAL HIGH (ref 70–179)
POTASSIUM: 3.6 mmol/L (ref 3.4–4.8)
PROTEIN TOTAL: 8.1 g/dL (ref 5.7–8.2)
SODIUM: 136 mmol/L (ref 135–145)

## 2022-06-20 LAB — T4, FREE: FREE T4: 0.78 ng/dL — ABNORMAL LOW (ref 0.89–1.76)

## 2022-06-20 LAB — SLIDE REVIEW

## 2022-06-20 LAB — TSH: THYROID STIMULATING HORMONE: 52.13 u[IU]/mL — ABNORMAL HIGH (ref 0.550–4.780)

## 2022-06-20 MED ORDER — GABAPENTIN 300 MG CAPSULE
ORAL_CAPSULE | Freq: Two times a day (BID) | ORAL | 2 refills | 90 days | Status: CP
Start: 2022-06-20 — End: ?

## 2022-06-20 MED ORDER — SPIRONOLACTONE 25 MG TABLET
ORAL_TABLET | Freq: Every day | ORAL | 3 refills | 90 days | Status: CP
Start: 2022-06-20 — End: ?

## 2022-06-20 MED ORDER — IBUPROFEN 600 MG TABLET
ORAL_TABLET | Freq: Three times a day (TID) | ORAL | 1 refills | 10 days | Status: CP | PRN
Start: 2022-06-20 — End: ?

## 2022-06-20 MED ORDER — AMLODIPINE 10 MG TABLET
ORAL_TABLET | Freq: Every day | ORAL | 3 refills | 90 days | Status: CP
Start: 2022-06-20 — End: ?

## 2022-06-20 NOTE — Unmapped (Signed)
Clinical Pharmacist Practitioner: [facility]    Patient Name: Paul Hines  Patient Age: 52 y.o.  Encounter Date: 06/20/2022  Primary Oncologist: Ginette Otto, DO, MPH    Reason for visit: oral chemotherapy follow-up    ASSESSMENT & PLAN:  1. Metastatic PTC: patient started on Lenvima on 02/21/21 and he is overall tolerating well with stable fatigue. Last CT scans on 05/30/2022 showed SD. He reports missing 1-2 doses a week so we discussed ways to improve adherence (pill box, phone reminders, put meds on nightstand to remember to take them, etc.). Repeat TG pending.  Continue Lenvima 18 mg daily    Thyroglobulin, Tumor Marker, IA (ng/mL)   Date Value   03/15/2022 107 (H)   02/01/2022 70 (H)   10/05/2021 662 (H)   06/15/2021 502 (H)   02/01/2021 323 (H)     2. HTN: clinic BP 166/112 today. Not checking BP at home. Has been taking two of the three prescribed antihypertensives and reports missing doses 1-2x per week. He believes he is taking amlodipine and Hyzaar. Encouraged adherence to blood pressure medications. Given that he has continued to be hypertensive while only taking 2 of his medications, will plan to restart third blood pressure medication.   Continue amlodipine 10 mg daily - refill sent to [facility]  Continue Hyzaar 100-25 mg daily  Restart spironalactone 25 mg daily - refill sent to [facility]    3. Type 2 diabetes: poorly controlled. Hgb A1c up to 11.4% today from 9.8% on 02/01/22. Continues on metformin and basal insulin 30 units nightly. He only takes metformin once daily instead of BID and misses approximately 3 doses of insulin weekly. Not checking BG at home and requests CGM system, however Medicaid will not cover unless patient is on two or more insulin injections per day. Given he is nonadherent on medications, plan to continue current regimen and emphasized the importance of adherence.   Continue Basaglar 30 units qhs and continue metformin 1000 mg BID  Encouraged testing BG at least once daily and recording values to bring to follow up appointments  Has appt w/ primary care on 07/14/22    4. Hidrandenitis suppurativa: followed by dermatology.  Continue Humira, doxycycline 100 mg BID PRN for flares and Hibiclens washes in the shower  Next follow up with dermatology on 09/06/22    5. Postsurgical hypothyroidism: goal for TSH suppression (<0.1). TSH elevated at 52 today with low free T4. Patient has not taken Synthroid as he forgets to take it on an empty stomach. Explained the importance of adherence with Synthroid for his thyroid cancer and informed him that its better to take with food than to not take at all.  Continue levothyroxine 250 mcg daily (2 x 125 mcg tabs daily)    6. Peripheral neuropathy: likely 2/2 to T2DM. Stable since last visit.  Continue gabapentin 300 mg BID - refill sent to [facility]    7. Neck pain: improved with prn ibuprofen.  Continue ibuprofen 600 mg q6h prn - refill sent to [facility]    8. H/o ischemic cardiac disease s/p catheterization: refer to IM clinic. Lipid panel on 10/05/21 elevated with cholesterol 239, HDL 100, LDL 115, non HDL 295.   Continue atorvastatin 80 mg daily  Has follow up with PCP on 07/14/22    F/U: RTC 7/2 for appt with Dr. Rosana Hoes    ______________________________________________________________________    HPI: Paul Hines is a 52 y.o. male with history of T2DM, hypertension, ischemic  cardiac disease s/p catheterization in 2021 and metastatic thyroid cancer, s/p resection with Dr. Azucena Fallen on 04/12/20 and neck dissection on 07/28/20. Started Lenvima on 02/21/21 although has had issues with noncompliance.     Oral chemotherapy: Lenvima 18 mg daily  Start date: 02/21/21   Specialty pharmacy: [facility]  Copay: $0    Interim History:   -overall feeling well with minimal side effects   -experiencing constipation. Has a bowel movement twice weekly. Discussed lifestyle changes such as diet and hydration along with OTC medications to use for constipation   -fatigue has been stable   -no new side effects on Lenvima; has been taking daily and misses doses 1-2x a week  -reports pain in neck/thyroid and requests refill of ibuprofen   -HTN: takes amlodipine and losartan- hydrochlorothiazide with missed doses once or twice a week. He doesn't check BP at home  -DM: doesn't check BG, still on Basaglar 30 units nightly and metformin daily   - dose not take Synthroid due to forgetting to take on an empty stomach. Encouraged Synthroid use due to importance of TSH suppression    Adherence: confirms taking appropriately, although misses 1-2 doses per week  Drug-Drug Interactions: none per current med list    Oncology History:  Hematology/Oncology History   Thyroid cancer (CMS-HCC)   03/09/2020 Initial Diagnosis    Thyroid cancer (CMS-HCC)     07/29/2021 -  Cancer Staged    Staging form: Thyroid - Differentiated, AJCC 8th Edition  - Clinical: Stage II (cM1, Age at diagnosis: < 55 years) - Signed by Dionicia Abler, DO on 07/29/2021           Problem List:   Patient Active Problem List   Diagnosis    Abnormal computed tomography angiography (CTA)    Thyroid cancer (CMS-HCC)    Obesity    Right inguinal hernia    Diabetes (CMS-HCC)    Essential hypertension    Hidradenitis suppurativa    Postoperative hypothyroidism       Medications:  Current Outpatient Medications   Medication Sig Dispense Refill    adalimumab (HUMIRA PEN CROHNS-UC-HS START) 40 mg/0.8 mL injection PEN Inject the contents of 1 pen (40 mg total) under the skin once a week. Maintenance dose 4 each 5    atorvastatin (LIPITOR) 80 MG tablet Take 1 tablet (80 mg total) by mouth nightly. 90 tablet 2    clindamycin (CLEOCIN T) 1 % lotion Apply topically two (2) times a day. 60 mL 11    doxycycline (VIBRA-TABS) 100 MG tablet Take 1 tablet (100 mg total) by mouth two (2) times a day. Take with food. 168 tablet 0    empty container Misc Use as directed to dispose of Humira pens. 1 each 2    HUMIRA PEN CITRATE FREE STARTER PACK FOR CROHN'S/UC/HS 3 X 80 MG/0.8 ML Inject the contents of 2 pens (160 mg) on day 1, THEN 1 pen (80 mg) on day 15. 3 each 0    insulin glargine (BASAGLAR, LANTUS) 100 unit/mL (3 mL) injection pen Inject 0.3 mL (30 Units total) under the skin nightly. 15 mL 2    lenvatinib (LENVIMA) 18 mg/day (10 mg x 1 AND 4 mg x 2) cap Take one (10mg ) capsule plus two (4mg ) capsules by mouth daily for a total of 18mg  daily. 90 capsule 2    levothyroxine (SYNTHROID) 125 MCG tablet Take 2 tablets (250 mcg total) by mouth daily. 180 tablet 3    losartan-hydroCHLOROthiazide (HYZAAR)  100-25 mg per tablet Take 1 tablet by mouth daily. 90 tablet 3    metFORMIN (GLUCOPHAGE) 500 MG tablet Take 2 tablets (1,000 mg total) by mouth in the morning and 2 tablets (1,000 mg total) in the evening. Take with meals. 360 tablet 2    amlodipine (NORVASC) 10 MG tablet Take 1 tablet (10 mg total) by mouth daily. 90 tablet 3    blood sugar diagnostic (ACCU-CHEK GUIDE TEST STRIPS) Strp Use to check blood glucose once daily. (Patient not taking: Reported on 04/19/2022) 100 strip 1    blood-glucose meter kit Test blood glucose level once daily. (Patient not taking: Reported on 04/19/2022) 1 each 0    gabapentin (NEURONTIN) 300 MG capsule Take 1 capsule (300 mg total) by mouth two (2) times a day. 180 capsule 2    ibuprofen (MOTRIN) 600 MG tablet Take 1 tablet (600 mg total) by mouth Three (3) times a day as needed for pain. 30 tablet 1    lancets Misc Use to check blood glucose once daily. (Patient not taking: Reported on 04/19/2022) 100 each 1    pen needle, diabetic 32 gauge x 5/32 (4 mm) Ndle Use with insulin glargine once daily (Patient not taking: Reported on 04/19/2022) 100 each 1    spironolactone (ALDACTONE) 25 MG tablet Take 1 tablet (25 mg total) by mouth daily. 90 tablet 3     No current facility-administered medications for this visit.       Allergies:  Allergies   Allergen Reactions    Penicillin Anaphylaxis    Fish Derived     Lisinopril     Peanut Butter Flavor      Per allergy testing    Penicillins Other (See Comments)     Per patient, when he took at age 37 his legs and arms shook. Not sure if it was seizure or not.     Shellfish Containing Products     Soy        Personal and Social History:   Social History     Tobacco Use    Smoking status: Some Days     Types: Cigarettes     Passive exposure: Current    Smokeless tobacco: Never   Substance Use Topics    Alcohol use: Yes   He reports that he does not currently use drugs after having used the following drugs: Marijuana.    Family History:  His family history is not on file.    Review of Systems: A complete review of systems was obtained including: Constitutional, Eyes, ENT, Cardiovascular, Respiratory, GI, GU, Musculoskeletal, Skin, Neurological, Psychiatric, Endocrine, Heme/Lymphatic, and Allergic/Immunologic systems. All other systems reviewed and are negative to the patient???s management except for what was mentioned in the interim history.     Vital Signs: BP 166/112 Comment: informed [name] Kaizley Aja - Pulse 75  - Temp 36.6 ??C (97.9 ??F) (Temporal)  - Resp 16  - Ht 170.2 cm (5' 7)  - Wt 76.2 kg (168 lb 1.6 oz)  - SpO2 99%  - BMI 26.33 kg/m??     Laboratory Data:  Lab on 06/20/2022   Component Date Value    Sodium 06/20/2022 136     Potassium 06/20/2022 3.6     Chloride 06/20/2022 99     CO2 06/20/2022 30.0     Anion Gap 06/20/2022 7     BUN 06/20/2022 17     Creatinine 06/20/2022 0.98     BUN/Creatinine Ratio 06/20/2022 17  eGFR CKD-EPI (2021) Male 06/20/2022 >90     Glucose 06/20/2022 267 (H)     Calcium 06/20/2022 9.3     Albumin 06/20/2022 3.5     Total Protein 06/20/2022 8.1     Total Bilirubin 06/20/2022 0.3     AST 06/20/2022 44 (H)     ALT 06/20/2022 26     Alkaline Phosphatase 06/20/2022 94     Free T4 06/20/2022 0.78 (L)     TSH 06/20/2022 52.130 (H)     WBC 06/20/2022 5.1     RBC 06/20/2022 5.61 (H)     HGB 06/20/2022 13.7     HCT 06/20/2022 41.5     MCV 06/20/2022 73.8 (L)     MCH 06/20/2022 24.3 (L)     MCHC 06/20/2022 33.0     RDW 06/20/2022 16.0 (H)     MPV 06/20/2022 8.4     Platelet 06/20/2022 254     Neutrophils % 06/20/2022 51.5     Lymphocytes % 06/20/2022 38.4     Monocytes % 06/20/2022 6.1     Eosinophils % 06/20/2022 2.8     Basophils % 06/20/2022 1.2     Absolute Neutrophils 06/20/2022 2.6     Absolute Lymphocytes 06/20/2022 2.0     Absolute Monocytes 06/20/2022 0.3     Absolute Eosinophils 06/20/2022 0.1     Absolute Basophils 06/20/2022 0.1     Microcytosis 06/20/2022 Slight (A)     Hypochromasia 06/20/2022 Marked (A)     Smear Review Comments 06/20/2022 See Comment (A)         I spent 30 minutes with Mr.Cushman in direct patient care.    Erling Conte, PharmD  PGY-2 Oncology Pharmacy Resident     Bobby Rumpf, PharmD, BCOP, CPP  Clinical Pharmacist Practitioner, [facility]

## 2022-06-20 NOTE — Unmapped (Addendum)
Thyroid Cancer   - Lenvima one 10 mg capsules and two 4 mg capsules (18 mg) daily   - Levothyroxine 2 tablets (250 mcg) daily on an empty stomach     Blood Pressure   - Losartan + hydrochlorothiazide 1 tablet (100-25 mg) daily   - Amlodipine 1 tablet (10 mg) daily   - Spironolactone 1 tablet (25 mg) daily     Diabetes  - Insulin Basaglar 30 units under the skin nightly   - Metformin 2 tablets (1000 mg) twice daily     Pain   - Gabapentin 1 capsule (300 mg) twice daily   - Ibuprofen 1 tablet (600 mg) every 6 hours as needed for pain     Call your pharmacy if you need refills: [facility] [phone]

## 2022-06-20 NOTE — Unmapped (Unsigned)
[facility]  PCP: Andree Coss, MD    Consulting providers:  Otolaryngology: Dr. 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 man with history of poorly controlled T2DM, hypertension, ischemic cardiac disease s/p catheterization in 2021 and metastatic thyroid cancer, s/p resection with Dr. Azucena Fallen on 04/12/20 and neck dissection on 07/28/20. Noted to have metastatic disease with pulmonary involvement on PET/CT (01/14/2022). Treating with 1L Lenvima 18 mg daily (02/21/21-current). Course has been complicated by poor medication compliance. He presents today in follow up on treatment to review scans.    # Metastatic Papillary Thyroid Cancer, BRAF WT  - Treatment goals are palliative.   - Tg: 75 (02/17/2020) --> 43 (06/24/2020) --> 212 (01/04/2021) --> 323 (02/01/21) ---> 502 (06/15/21) --> 662 (10/05/2021) --> 70 (02/01/2022) --> 107 (03/15/2022) --> pending (06/20/2022)  - Currently on Lenvima (02/21/21- current); notable for multiple missed doses  - CT Neck/Chest (05/30/2022): stable disease in the neck, several new subcentimeter nodules in the RLL  - Labs reviewed and acceptable  - Continue Lenvima 18 mg daily  - We previously discussed the possibility of targeted RT to the sclerotic lesions within the right posterior 8th and 12th ribs; will reassess his pain level at next visit  - RTC in 6 weeks    # Hidradenitis suppurativa  - Poorly controlled, seen in ED 10/9 and prescribed clindamycin gel but not covered by insurance  - Followed by dermatology (Dr. Hayes Ludwig treated with 12 week course of doxycycline 100 mg BID + Hibiclens washes in the shower    # Hypertension, poor control  - 171/110, remains above goal today  - Current regimen includes amlodipine 10 mg daily, Lorsartan 100 mg and HCTZ 25 mg daily   - Encouraged medical compliance and frequent home checks  - Has been referred to [facility]    # Diabetes, ongoing hyperglycemia  - Poor control but improving, A1c 14.6% (11/17/21) down to 9.8% (02/01/22)  - Managing with metformin 1000 mg twice daily and Basaglar 20 units nightly  - We also discussed importance of compliance on insulin as well as frequent blood sugar checks  - [facility] referral as above    # Hypothyroidism,  -  TSH 45.4, FT4 0.52 (03/15/2022)  - Poor control due to lack of medication compliance  - Goal TSH <0.1  -  Continue synthroid 200 mcg once daily.    # Supportive care  - Cancer-related pain: Continue on Gabapentin 300mg  + Tramadol 50 mg q6hrs prn (script given today)  - Psychosocial: denies any need for CCSP at this time.   - Constipation: Recommended Miralax     Will send a letter describing the patient's medical condition and prior treatments to Denton Brick ([email or web address], [phone]) as part of his social security.     Follow up: 6 weeks    Ginette Otto, DO, MPH  Head and Neck Medical Oncology  [facility] [place]  --------------------------  Interval History  - Feeling well, without any new or worsening symptoms on Lenvima  - Reports improved compliance with his medications, now missing 1-2 days of lenvima per week  - His compliance with synthroid is much less, as he often forgets to take it before eating  - Continues to report constipation  - He states his HS is improving with humira  - He reports numbness/tingling along the incision  lines in his neck, L>R, and radiating to his ear; this occurs often  - Not checking his BP as he lost his home BP cuff  - Denies headaches, vision changes, or dizziness  - Denies any nausea, vomiting, SOB, or coughing     History of Present Illness:  Paul Hines is a 52 y.o. male with history of T2DM, hypertension, ischemic heart disease s/p cardiac catheterization (07/2019) who presents for evaluation of head and neck cancer. I have reviewed his records including history, imaging, pathology reports, and, when applicable, operative notes and summarized his oncologic history below:     Patient was incidentally diagnosed on workup for chest pain in 2021. CTA cardiac (07/08/2019) showed bilateral pulmonary nodules and repeat imaging from 10/2019 and 12/2019 redemonstrated nodules. PET/CT obtained (10/2019) and was notable for hypermetabolic right hilar and mediastinal lymph nodes. FNA performed on 01/09/2020 to dominant right upper pulmonary nodule and confirmed papillary thyroid carcinoma. Further biopsy of lymph nodes were negative for malignancy.      He was evaluated by Dr. Azucena Fallen in February 2022. At that time he reports intermittent dysphagia, but was not significantly bothersome and did not limit his diet. He otherwise denied palpable neck mass, voice changes, otalgia or weight loss. Pre-treatment Tg was 75 in January 2022. Patient underwent total thyroidectomy on 04/12/2020 with bilateral neck dissection on 07/28/2020. Final pathology consistent with PTC, lymph node involvement (14/14) with positive ENE. Received adjuvant RAI (200 mCi I-131) on 05/20/2020.      PET/CT (01/14/2021) concerning for numerous pulmonary nodules that had increased in size when compared to prior imaging. Otherwise, no evidence of distant disease. Tumor marker was also noted to be elevated at 212 (01/04/2021).      02/01/2021: Present unaccompanied today, feeling well until recent illness as described above. He does report residual numbness all along the underside of his face. Previously smoked 2 packs of cigarettes per day for 14 years. Currently smoking ~3 cigarettes daily. He is not currently employed, but would like to work if possible. He was recently been released from prison and is currently staying with [facility].     Past Medical History:   Diagnosis Date    Abnormal findings on dx imaging of heart and cor circ     Acne vulgaris     Allergic contact dermatitis due to food in contact with skin     Atherosclerosis     Athscl heart disease of native coronary artery w/o ang pctrs     Chronic ischemic heart disease     Cutaneous abscess     Diabetic polyneuropathy (CMS-HCC)     Disorder of skin and subcutaneous tissue     Essential hypertension     H/O medication noncompliance     Hernia, inguinal, unilateral     Hypertrophic cardiomyopathy (CMS-HCC)     Malignant neoplasm of thyroid gland (CMS-HCC)     Presbyopia     Pseudofolliculitis barbae     Shoulder pain, bilateral     Tinea unguium     Type 2 diabetes mellitus (CMS-HCC)     Xerosis cutis        Past Surgical History:   Procedure Laterality Date    PR BRNCHSC EBUS GUIDED SAMPL 1/2 NODE STATION/STRUX N/A 01/09/2020    Procedure: BRONCH, RIGID OR FLEXIBLE, INC FLUORO GUIDANCE, WHEN PERFORMED; WITH EBUS GUIDED TRANSTRACHEAL AND/OR TRANSBRONCHIAL SAMPLING, ONE OR TWO MEDIASTINAL AND/OR HILAR LYMPH NODE STATIONS OR STRUCTURES;  Surgeon: Jerelyn Charles, MD;  Location: MAIN OR [place];  Service: Pulmonary    PR BRNSCHSC TNDSC EBUS DX/TX INTERVENTION PERPH LES Right 01/09/2020    Procedure: BRONCH, RIGID OR FLEXIBLE, INCLUDING FLUORO GUIDANCE, WHEN PERFORMED; WITH TRANSENDOSCOPIC EBUS DURING BRONCHOSCOPIC DIAGNOSTIC OR THERAPEUTIC INTERVENTION(S) FOR PERIPHERAL LESION(S);  Surgeon: Jerelyn Charles, MD;  Location: MAIN OR [place];  Service: Pulmonary    PR BRONCHOSCOPY,COMPUTER ASSIST/IMAGE-GUIDED NAVIGATION Right 01/09/2020    Procedure: ROBOT ION BRONCHOSCOPY,RIGID OR FLEXIBLE,INCLUDE FLUORO WHEN PERFORMED; W/COMPUTER-ASSIST,IMAGE-GUIDED NAVIGATION;  Surgeon: Jerelyn Charles, MD;  Location: MAIN OR [place];  Service: Pulmonary    PR BRONCHOSCOPY,DIAGNOSTIC W LAVAGE Right 01/09/2020    Procedure: BRONCHOSCOPY, RIGID OR FLEXIBLE, INCLUDE FLUOROSCOPIC GUIDANCE WHEN PERFORMED; W/BRONCHIAL ALVEOLAR LAVAGE;  Surgeon: Jerelyn Charles, MD;  Location: MAIN OR [place];  Service: Pulmonary    PR BRONCHOSCOPY,TRANSBRON ASPIR BX Right 01/09/2020    Procedure: BRONCHOSCOPY, RIGID/FLEX, INCL FLUORO; W/TRANSBRONCH NDL ASPIRAT BX, TRACHEA, MAIN STEM &/OR LOBAR BRONCHUS;  Surgeon: Jerelyn Charles, MD;  Location: MAIN OR [place];  Service: Pulmonary    PR BRONCHOSCOPY,TRANSBRONCH BIOPSY Right 01/09/2020    Procedure: BRONCHOSCOPY, RIGID/FLEXIBLE, INCLUDE FLUORO GUIDANCE WHEN PERFORMED; W/TRANSBRONCHIAL LUNG BX, SINGLE LOBE;  Surgeon: Jerelyn Charles, MD;  Location: MAIN OR [place];  Service: Pulmonary    PR CATH PLACE/CORON ANGIO, IMG SUPER/INTERP,W LEFT HEART VENTRICULOGRAPHY N/A 08/26/2019    Procedure: Left Heart Catheterization;  Surgeon: Marlaine Hind, MD;  Location: [facility] CATH;  Service: Cardiology    PR LIGATN THOR DUCT,CERV APPROACH Bilateral 07/28/2020    Procedure: SUTURE &/OR LIG THORACIC DUCT; CERV APPROACH;  Surgeon: Lauralee Evener, MD;  Location: MAIN OR [facility];  Service: ENT    PR REMOVAL NODES, NECK,CERV MOD RAD Bilateral 07/28/2020    Procedure: CERVICAL LYMPHADENECTOMY (MODIFIED RADICAL NECK DISSECTION);  Surgeon: Lauralee Evener, MD;  Location: MAIN OR [facility];  Service: ENT    PR THYROIDECTOMY,MALIG,LTD NECK SURG Bilateral 04/12/2020    Procedure: THYROIDECTOMY, TOTAL OR SUBTOTAL FOR MALIGNANCY; WITH LIMITED NECK DISSECTION;  Surgeon: Lauralee Evener, MD;  [facility];  Service: ENT       Current Outpatient Medications   Medication Sig Dispense Refill    adalimumab (HUMIRA PEN CROHNS-UC-HS START) 40 mg/0.8 mL injection PEN Inject the contents of 1 pen (40 mg total) under the skin once a week. Maintenance dose 4 each 5    amlodipine (NORVASC) 10 MG tablet Take 1 tablet (10 mg total) by mouth daily. 90 tablet 3    atorvastatin (LIPITOR) 80 MG tablet Take 1 tablet (80 mg total) by mouth nightly. 90 tablet 2    blood sugar diagnostic (ACCU-CHEK GUIDE TEST STRIPS) Strp Use to check blood glucose once daily. (Patient not taking: Reported on 04/19/2022) 100 strip 1    blood-glucose meter kit Test blood glucose level once daily. (Patient not taking: Reported on 04/19/2022) 1 each 0    clindamycin (CLEOCIN T) 1 % lotion Apply topically two (2) times a day. 60 mL 11    doxycycline (VIBRA-TABS) 100 MG tablet Take 1 tablet (100 mg total) by mouth two (2) times a day. Take with food. 168 tablet 0    empty container Misc Use as directed to dispose of Humira pens. 1 each 2    gabapentin (NEURONTIN) 300 MG capsule Take 1 capsule (300 mg total) by mouth two (2) times a day. 180 capsule 2    HUMIRA PEN CITRATE FREE STARTER PACK FOR CROHN'S/UC/HS 3 X 80 MG/0.8 ML Inject the contents of 2 pens (  160 mg) on day 1, THEN 1 pen (80 mg) on day 15. 3 each 0    ibuprofen (MOTRIN) 600 MG tablet Take 1 tablet (600 mg total) by mouth Three (3) times a day as needed for pain. 30 tablet 1    insulin glargine (BASAGLAR, LANTUS) 100 unit/mL (3 mL) injection pen Inject 0.3 mL (30 Units total) under the skin nightly. 15 mL 2    lancets Misc Use to check blood glucose once daily. (Patient not taking: Reported on 04/19/2022) 100 each 1    lenvatinib (LENVIMA) 18 mg/day (10 mg x 1 AND 4 mg x 2) cap Take one (10mg ) capsule plus two (4mg ) capsules by mouth daily for a total of 18mg  daily. 90 capsule 2    levothyroxine (SYNTHROID) 125 MCG tablet Take 2 tablets (250 mcg total) by mouth daily. 180 tablet 3    losartan-hydroCHLOROthiazide (HYZAAR) 100-25 mg per tablet Take 1 tablet by mouth daily. 90 tablet 3    metFORMIN (GLUCOPHAGE) 500 MG tablet Take 2 tablets (1,000 mg total) by mouth in the morning and 2 tablets (1,000 mg total) in the evening. Take with meals. 360 tablet 2    pen needle, diabetic 32 gauge x 5/32 (4 mm) Ndle Use with insulin glargine once daily (Patient not taking: Reported on 04/19/2022) 100 each 1    spironolactone (ALDACTONE) 25 MG tablet Take 1 tablet (25 mg total) by mouth daily. 90 tablet 3    traMADol (ULTRAM) 50 mg tablet Take 1 tablet (50 mg total) by mouth every six (6) hours as needed for pain. 20 tablet 0     No current facility-administered medications for this visit.       Allergies   Allergen Reactions Penicillin Anaphylaxis    Fish Derived     Lisinopril     Peanut Butter Flavor      Per allergy testing    Penicillins Other (See Comments)     Per patient, when he took at age 65 his legs and arms shook. Not sure if it was seizure or not.     Shellfish Containing Products     Soy        Social History     Tobacco Use    Smoking status: Some Days     Types: Cigarettes     Passive exposure: Current    Smokeless tobacco: Never   Vaping Use    Vaping status: Never Used   Substance Use Topics    Alcohol use: Yes    Drug use: Not Currently     Types: Marijuana     Comment: quit in 1992       Social History     Social History Narrative    Not on file       Family History   Problem Relation Age of Onset    Melanoma Neg Hx     Basal cell carcinoma Neg Hx     Squamous cell carcinoma Neg Hx        Review of Systems: A 12-system review of systems was obtained including: Constitutional, Eyes, ENT, Cardiovascular, Respiratory, GI, GU, Musculoskeletal, Skin, Neurological, Psychiatric, Endocrine, Heme/Lymphatic, and Allergic/Immunologic systems. It is negative or non-contributory to the patient???s management except for as stated in patient's HPI    ECOG PS: 0    Physical Examination:  Vital Signs: BP (S) 171/110 Comment: Dr Rosana Hoes notified  CONSTITUTIONAL: Well appearing man in NAD  Oral Cavity: MMM, no oral lesions or mucositis  Lymphatics: No lymphadenopathy   CV: RRR; no lower extremity edema  RESP: normal work of breathing  GI: Soft, non-tender, non-distended  SKIN: No skin rashes; groin not examined today  NEURO: no focal deficits appreciated  PSYCH: Normal mood and appropriate affect    LABS  Lab Results   Component Value Date    WBC 5.1 06/20/2022    HGB 13.7 06/20/2022    HCT 41.5 06/20/2022    PLT 254 06/20/2022       Lab Results   Component Value Date    NA 136 06/20/2022    K 3.6 06/20/2022    CL 99 06/20/2022    CO2 30.0 06/20/2022    BUN 17 06/20/2022    CREATININE 0.98 06/20/2022    GLU 267 (H) 06/20/2022    CALCIUM 9.3 06/20/2022    MG 1.6 07/31/2020    PHOS 3.0 07/31/2020       Lab Results   Component Value Date    BILITOT 0.3 06/20/2022    PROT 8.1 06/20/2022    ALBUMIN 3.5 06/20/2022    ALT 26 06/20/2022    AST 44 (H) 06/20/2022    ALKPHOS 94 06/20/2022       No results found for: PT, INR, APTT      IMAGING  CT Neck (05/30/2022)  --Asymmetric enlargement of the right palatine tonsil abutting the uvula without discrete mass identified, nonspecific could be secondary to patient positioning. Consider further evaluation with direct visualization or PET CT.  --No definite recurrent disease within the soft tissues of the neck.  --Similar appearance of the bilateral pulmonary metastases.    CT Chest (05/30/2022)  New subcentimeter pulmonary nodules in the right lower lobe which could represent metastases. Other previously present numerous bilateral metastatic nodules are unchanged.    PATHOLOGY  THYROID GLAND  8th Edition - Protocol posted: 03/27/2018   THYROID GLAND: RESECTION - All Specimens  Clinical History  No known radiation exposure    SPECIMEN   Procedure  Total thyroidectomy    TUMOR   Tumor Focality  Multifocal    Tumor Characteristics     Tumor Site  Right lobe      Left lobe      Isthmus    Histologic Type  Papillary carcinoma, classic (usual, conventional)    Tumor Size  Greatest Dimension (Centimeters): 3.1 cm   Extrathyroidal Extension  Not identified    Angioinvasion (vascular invasion)  Not identified    Lymphatic Invasion  Not identified    Perineural Invasion  Not identified    Margins  Uninvolved by carcinoma    Distance of Invasive Carcinoma from Closest Margin (Millimeters)  1 mm   Mitotic Rate  4 Mitoses per 2 mm^2   LYMPH NODES   Number of Lymph Nodes Involved  14    Nodal Levels Involved  Level VI    Size of Largest Metastatic Deposit (Centimeters)  5 cm   Extranodal Extension (ENE)  Present    Number of Lymph Nodes Examined  14    Nodal Levels Examined  Level VI    PATHOLOGIC STAGE CLASSIFICATION (pTNM, AJCC 8th Edition)   TNM Descriptors  m (multiple primary tumors)         Primary Tumor (pT)  pT2    Regional Lymph Nodes (pN)  pN1a    Distant Metastasis (pM)  pM1    Site(s)  lung see [id number]      I have personally reviewed relevant imaging, laboratory values,  existing medical records, and pathology. I have summarized these findings in the oncology history above.    Scribe's Attestation: Ginette Otto, DO obtained and performed the history, physical exam and medical decision making elements that were entered into the chart. Signed by Welton Flakes, Scribe, on Jun 20, 2022 at 3:09 PM     {*** NOTE TO PROVIDER: PLEASE ADD ATTESTATION NOTING YOU AGREE WITH SCRIBE   DOCUMENTATION}

## 2022-06-21 LAB — HEMOGLOBIN A1C
ESTIMATED AVERAGE GLUCOSE: 280 mg/dL
HEMOGLOBIN A1C: 11.4 % — ABNORMAL HIGH (ref 4.8–5.6)

## 2022-06-21 MED ORDER — AMLODIPINE 10 MG TABLET
ORAL_TABLET | Freq: Every day | ORAL | 3 refills | 90 days | Status: CP
Start: 2022-06-21 — End: ?
  Filled 2022-06-20: qty 90, 90d supply, fill #0

## 2022-06-21 MED ORDER — SPIRONOLACTONE 25 MG TABLET
ORAL_TABLET | Freq: Every day | ORAL | 3 refills | 90 days | Status: CP
Start: 2022-06-21 — End: ?
  Filled 2022-08-02: qty 90, 90d supply, fill #0

## 2022-06-21 MED ORDER — GABAPENTIN 300 MG CAPSULE
ORAL_CAPSULE | Freq: Two times a day (BID) | ORAL | 2 refills | 90 days | Status: CP
Start: 2022-06-21 — End: ?
  Filled 2022-06-20: qty 180, 90d supply, fill #0

## 2022-06-21 MED ORDER — IBUPROFEN 600 MG TABLET
ORAL_TABLET | Freq: Three times a day (TID) | ORAL | 1 refills | 10 days | Status: CP | PRN
Start: 2022-06-21 — End: ?
  Filled 2022-06-20: qty 30, 10d supply, fill #0

## 2022-06-22 LAB — THYROGLOBULIN PANEL: THYROGLOBULIN AB: <1.8 [IU]/mL

## 2022-06-22 LAB — REFLEX - THYROGLOBULIN, TUMOR MARKER BY IMMUNOASSAY: THYROGLOBULIN, TUMOR MARKER, IA: 147 ng/mL — ABNORMAL HIGH

## 2022-06-28 NOTE — Unmapped (Signed)
Patient [facility] Cancer Support Encounter    [name] called and asked that I review his appointments. Reviewed upcoming appointment locations and times.     [name], [name], RN, [facility]   She/Her/Hers  Oncology Nurse Navigator - [name] [facility]  [facility]  [address] [place], [place] [address]    Center: [phone]  Voicemail: [phone]  [email or web address]

## 2022-06-28 NOTE — Unmapped (Signed)
PA initiated for Clindamycin 1% Lotion through [facility]. Key: [id number]

## 2022-07-11 NOTE — Unmapped (Signed)
Patient [facility] Cancer Support Encounter    Attempted to call Mr. Monterosso to check-in and review upcoming appointment. Needs to arrange ride in advance. Call did not go through on his phone. On chart review, pharmacist also has been attempting to contact Mr. Data with no success. Called Nettie Elm, community Child psychotherapist, to see if he has a new phone number or if she has heard anything from him this or last week. Nettie Elm also has not heard anything from him, so reports that she will drive past his house to check on him and do a wellness check if unable to get a hold of him.     [name], [name], RN, [facility]   She/Her/Hers  Oncology Nurse Navigator - [name] [facility]  [facility]  [address], [place], [place] [address]    Center: [phone]  Voicemail: [phone]  [email or web address]

## 2022-07-14 ENCOUNTER — Ambulatory Visit: Admit: 2022-07-14 | Payer: PRIVATE HEALTH INSURANCE

## 2022-07-26 ENCOUNTER — Ambulatory Visit
Admit: 2022-07-26 | Payer: PRIVATE HEALTH INSURANCE | Attending: Student in an Organized Health Care Education/Training Program | Primary: Student in an Organized Health Care Education/Training Program

## 2022-07-26 NOTE — Unmapped (Unsigned)
Dermatology Note     Assessment and Plan:      Hidradenitis suppurativa, severe, Hurley III, Chronic: flared or not at treatment goal  Current stage 4 metastatic thyroid cancer being treated with lenvatinib   - We discussed the typical natural history, pathogenesis, treatment options, and expected course as well as the relapsing and sometimes recalcitrant nature of the disease and potential for sinus tract formation and scarring.    - We discussed associated comorbid conditions (pilonidal cysts, IBD, psoriasis, acne, dissecting cellulitis)  - Discussed that combination of medical and surgical therapy is often required for treatment.   - Reviewed association between HS and tobacco use; encouraged cessation  - Reviewed role of antibiotics in HS to decrease inflammation   - Discussed that the medication he is on for his metastatic thyroid cancer (lenvatinib) can sometimes cause lesions to flare  - Patient has a history of uncontrolled diabetes mellitus (most recent A1C 9.8 on 02/01/22), encouraged glycemic control as this can also help with disease activity  - Referring oncologist, Dr. Ginette Otto Hemant, ok for [place] to initiate Humira as in line w goals of care     Plan:  - Start adalimumab (initially denied by insurance, need hepatitis panel)   - We discussed risks associated with the medication including serious infections, injection site reactions, psoriasiform reactions, certain malignancies, lupus-like reactions and demyelinating disease, among others.  - HUMIRA PEN CITRATE FREE STARTER PACK FOR CROHN'S/UC/HS 3 X 80 MG/0.8 ML; 160 mg day 0, then 80 mg on day 14. Loading dose.  - adalimumab (HUMIRA PEN CROHNS-UC-HS START) 40 mg/0.8 mL injection PEN; Inject 0.8 mL (40 mg total) under the skin once a week. Maintenance dose.  - Continue doxycycline 100 mg BID PRN for flares, refilled today   - Discussed potential side effects including sun sensitivity, allergic reaction, GI upset including esophagitis and diarrhea and to stop the medication and call if these should occur.   - Start clindamycin (CLEOCIN T) 1 % lotion; Apply topically two (2) times a day.  - Continue Hibiclens wash qday in shower  - Start Vaseline as needed     High risk medication use, Humira  - Quantiferon TB negative March 2024  - Hepatitis B&C serologies negative 05/30/2022    The patient was advised to call for an appointment should any new, changing, or symptomatic lesions develop.     RTC: No follow-ups on file. or sooner as needed   _________________________________________________________________      Chief Complaint     No chief complaint on file.      HPI     Paul Hines is a 52 y.o. male who presents as a returning patient (last seen 05/30/2022) to Dermatology for follow up of HS. At last visit, the patient was to start Humira 160 mg day 0, then 80 mg day 14, then 40 mg weekly, and to continue doxycycline 100 mg twice daily, to start clindamycin 1% lotion for HS.     The patient denies any other new or changing lesions or areas of concern.     Pertinent Past Medical History     Past Medical History, Family History, Social History, Medication List, Allergies, and Problem List were reviewed in the rooming section of Epic.     ROS: Other than symptoms mentioned in the HPI, no fevers, chills, or other skin complaints    Physical Examination     GENERAL: Well-appearing male in no acute distress, resting comfortably.  NEURO: Alert  and oriented, answers questions appropriately  PSYCH: Normal mood and affect  {PE extent:75514}  {PE list:75421}  ***    All areas not commented on are within normal limits or unremarkable    Scribe's Attestation: Reva Bores, PA obtained and performed the history, physical exam and medical decision making elements that were entered into the chart. Signed by Justine Null, Scribe, on ***.    {*** NOTE TO PROVIDER: PLEASE ADD ATTESTATION NOTING YOU AGREE WITH SCRIBE DOCUMENTATION}    (Approved Template 10/13/2019)

## 2022-07-31 DIAGNOSIS — C73 Malignant neoplasm of thyroid gland: Principal | ICD-10-CM

## 2022-07-31 NOTE — Unmapped (Signed)
Patient [facility] Cancer Support Encounter    Called Mr. Lisonbee to check-in, provide emotional support, and review upcoming appts. No answer, but left voicemail with name, role, reason for calling, and direct callback number.     [name], [name], RN, [facility]   She/Her/Hers  Oncology Nurse Navigator - [name] [facility]  [facility]  [address] [place], [place] [address]    Center: [phone]  Voicemail: [phone]  [email or web address]

## 2022-08-01 NOTE — Unmapped (Signed)
Hi,    Patient Paul Hines contacted the Communication Center to cancel their appointment for today.  The appointment has been rescheduled to 08/08/22.    Cancellation Reason: scheduling conflict    Thank you,  Rosary Lively  [facility] Cancer Communication Center   [phone]

## 2022-08-01 NOTE — Unmapped (Signed)
Patient [facility] Cancer Support Encounter    Mr. Boren called [facility] to discuss upcoming appointments and medications. Provided active listening and emotional support as patient discussed current needs and challenges.    Reports that he recently moved, so updated address in Epic. Also reports that he lost his medications in the move and needs help connecting with the pharmacy. Conference called with the [facility] to refill medications. Confirmed they have new address and are able to ship medications to new address by tomorrow.    Mr. Isenhour missed his dermatology and family medicine appointment. Conference called to reschedule appointments.     Mr. Courteau did not know he had his oncology appointments today and did not have transportation arranged. Conference called Dr. Ottis Stain office to reschedule appointments. Conference called [facility] to arrange transportation for appointment (reference number [phone]) with pickup at 12:15pm on 7/9.     [name], [name], RN, [facility]   She/Her/Hers  Oncology Nurse Navigator - [name]  [facility]  [address], [place], [place] [address]    Center: [phone]  Voicemail: [phone]  [email or web address]

## 2022-08-01 NOTE — Unmapped (Unsigned)
Head and Neck Oncology Clinic  PCP: Andree Coss, MD    Consulting providers:  Otolaryngology: Dr. 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    Assessment/Plan:    Paul Hines is a 52 y.o. man with history of poorly controlled T2DM, hypertension, ischemic cardiac disease s/p catheterization in 2021 and metastatic thyroid cancer, s/p resection with Dr. Azucena Fallen on 04/12/20 and neck dissection on 07/28/20. Noted to have metastatic disease with pulmonary involvement on PET/CT (01/14/2022). Treating with 1L Lenvima 18 mg daily (02/21/21-current). Course has been complicated by poor medication compliance. He presents today in follow up on treatment.    # Metastatic Papillary Thyroid Cancer, BRAF WT  - Treatment goals are palliative.   - Tg: 75 (02/17/2020) --> 43 (06/24/2020) --> 212 (01/04/2021) --> 323 (02/01/21) ---> 502 (06/15/21) --> 662 (10/05/2021) --> 70 (02/01/2022) --> 107 (03/15/2022) --> 148 (06/20/2022)  - Currently on Lenvima (02/21/21- current); notable for multiple missed doses  - CT Neck/Chest (05/30/2022): stable disease in the neck, several new subcentimeter nodules in the RLL  - Labs reviewed and acceptable  - Continue Lenvima 18 mg daily  - Encouraged medication compliance  - RTC in 6 weeks    # Hypothyroidism  - TSH 52, FT4 0.78 (06/20/2022) ***  - Poor control due to lack of medication compliance  - Goal TSH <0.1  - Continue synthroid 200 mcg once daily    # Hypertension, poorly control  - 171/110 ***  - Current regimen includes amlodipine 10 mg daily, Lorsartan 100 mg and HCTZ 25 mg daily   - Encouraged medical compliance and frequent home checks    # Hidradenitis suppurativa, improved  - Followed by dermatology (Dr. Loura Pardon)  - Treated with 12 week course of doxycycline 100 mg BID + Hibiclens washes in the shower    # Diabetes, ongoing hyperglycemia  - A1c 14.6% (11/17/21) --> 9.8% (02/01/22) --> 11.4 (06/20/2022)  - Continue metformin 1000 mg twice daily and Basaglar 20 units nightly  - We also discussed importance of compliance on insulin as well as frequent blood sugar checks    # Supportive care  - Cancer-related pain: Continue on Gabapentin 300mg  + Tramadol 50 mg q6hrs prn  - Psychosocial: denies any need for CCSP at this time.   - Constipation: Recommended Miralax   - SSI: letter sent to Denton Brick ([email or web address], [phone])     Follow up: 6 weeks    Ginette Otto, DO, MPH  Head and Neck Medical Oncology  [facility] [place]  --------------------------  Interval History  -   Past Interval History:   - Feeling well, without any new or worsening symptoms on Lenvima  - Reports improved compliance with his medications, now missing 1-2 days of lenvima per week  - His compliance with synthroid is much less, as he often forgets to take it before eating  - Continues to report constipation  - He states his HS is improving with humira  - He reports numbness/tingling along the incision lines in his neck, L>R, and radiating to his ear; this occurs often  - Not checking his BP as he lost his home BP cuff  - Denies headaches, vision changes, or dizziness  - Denies any nausea, vomiting, SOB, or coughing     History of Present Illness:  Paul Hines is a 52 y.o. male with history of T2DM, hypertension, ischemic heart disease s/p cardiac catheterization (07/2019) who presents for  evaluation of head and neck cancer. I have reviewed his records including history, imaging, pathology reports, and, when applicable, operative notes and summarized his oncologic history below:     Patient was incidentally diagnosed on workup for chest pain in 2021. CTA cardiac (07/08/2019) showed bilateral pulmonary nodules and repeat imaging from 10/2019 and 12/2019 redemonstrated nodules. PET/CT obtained (10/2019) and was notable for hypermetabolic right hilar and mediastinal lymph nodes. FNA performed on 01/09/2020 to dominant right upper pulmonary nodule and confirmed papillary thyroid carcinoma. Further biopsy of lymph nodes were negative for malignancy.      He was evaluated by Dr. Azucena Fallen in February 2022. At that time he reports intermittent dysphagia, but was not significantly bothersome and did not limit his diet. He otherwise denied palpable neck mass, voice changes, otalgia or weight loss. Pre-treatment Tg was 75 in January 2022. Patient underwent total thyroidectomy on 04/12/2020 with bilateral neck dissection on 07/28/2020. Final pathology consistent with PTC, lymph node involvement (14/14) with positive ENE. Received adjuvant RAI (200 mCi I-131) on 05/20/2020.      PET/CT (01/14/2021) concerning for numerous pulmonary nodules that had increased in size when compared to prior imaging. Otherwise, no evidence of distant disease. Tumor marker was also noted to be elevated at 212 (01/04/2021).      02/01/2021: Present unaccompanied today, feeling well until recent illness as described above. He does report residual numbness all along the underside of his face. Previously smoked 2 packs of cigarettes per day for 14 years. Currently smoking ~3 cigarettes daily. He is not currently employed, but would like to work if possible. He was recently been released from prison and is currently staying with [facility].     Past Medical History:   Diagnosis Date    Abnormal findings on dx imaging of heart and cor circ     Acne vulgaris     Allergic contact dermatitis due to food in contact with skin     Atherosclerosis     Athscl heart disease of native coronary artery w/o ang pctrs     Chronic ischemic heart disease     Cutaneous abscess     Diabetic polyneuropathy (CMS-HCC)     Disorder of skin and subcutaneous tissue     Essential hypertension     H/O medication noncompliance     Hernia, inguinal, unilateral     Hypertrophic cardiomyopathy (CMS-HCC)     Malignant neoplasm of thyroid gland (CMS-HCC)     Presbyopia     Pseudofolliculitis barbae     Shoulder pain, bilateral     Tinea unguium     Type 2 diabetes mellitus (CMS-HCC)     Xerosis cutis        Past Surgical History:   Procedure Laterality Date    PR BRNCHSC EBUS GUIDED SAMPL 1/2 NODE STATION/STRUX N/A 01/09/2020    Procedure: BRONCH, RIGID OR FLEXIBLE, INC FLUORO GUIDANCE, WHEN PERFORMED; WITH EBUS GUIDED TRANSTRACHEAL AND/OR TRANSBRONCHIAL SAMPLING, ONE OR TWO MEDIASTINAL AND/OR HILAR LYMPH NODE STATIONS OR STRUCTURES;  Surgeon: Jerelyn Charles, MD;  Location: MAIN OR [place];  Service: Pulmonary    PR BRNSCHSC TNDSC EBUS DX/TX INTERVENTION PERPH LES Right 01/09/2020    Procedure: BRONCH, RIGID OR FLEXIBLE, INCLUDING FLUORO GUIDANCE, WHEN PERFORMED; WITH TRANSENDOSCOPIC EBUS DURING BRONCHOSCOPIC DIAGNOSTIC OR THERAPEUTIC INTERVENTION(S) FOR PERIPHERAL LESION(S);  Surgeon: Jerelyn Charles, MD;  Location: MAIN OR [place];  Service: Pulmonary    PR BRONCHOSCOPY,COMPUTER ASSIST/IMAGE-GUIDED NAVIGATION Right 01/09/2020    Procedure: ROBOT ION BRONCHOSCOPY,RIGID  OR FLEXIBLE,INCLUDE FLUORO WHEN PERFORMED; W/COMPUTER-ASSIST,IMAGE-GUIDED NAVIGATION;  Surgeon: Jerelyn Charles, MD;  Location: MAIN OR [place];  Service: Pulmonary    PR BRONCHOSCOPY,DIAGNOSTIC W LAVAGE Right 01/09/2020    Procedure: BRONCHOSCOPY, RIGID OR FLEXIBLE, INCLUDE FLUOROSCOPIC GUIDANCE WHEN PERFORMED; W/BRONCHIAL ALVEOLAR LAVAGE;  Surgeon: Jerelyn Charles, MD;  Location: MAIN OR [place];  Service: Pulmonary    PR BRONCHOSCOPY,TRANSBRON ASPIR BX Right 01/09/2020    Procedure: BRONCHOSCOPY, RIGID/FLEX, INCL FLUORO; W/TRANSBRONCH NDL ASPIRAT BX, TRACHEA, MAIN STEM &/OR LOBAR BRONCHUS;  Surgeon: Jerelyn Charles, MD;  Location: MAIN OR [place];  Service: Pulmonary    PR BRONCHOSCOPY,TRANSBRONCH BIOPSY Right 01/09/2020    Procedure: BRONCHOSCOPY, RIGID/FLEXIBLE, INCLUDE FLUORO GUIDANCE WHEN PERFORMED; W/TRANSBRONCHIAL LUNG BX, SINGLE LOBE;  Surgeon: Jerelyn Charles, MD;  Location: MAIN OR [facility];  Service: Pulmonary    PR CATH PLACE/CORON ANGIO, IMG SUPER/INTERP,W LEFT HEART VENTRICULOGRAPHY N/A 08/26/2019    Procedure: Left Heart Catheterization;  Surgeon: Marlaine Hind, MD;  Location: [facility] CATH;  Service: Cardiology    PR LIGATN THOR DUCT,CERV APPROACH Bilateral 07/28/2020    Procedure: SUTURE &/OR LIG THORACIC DUCT; CERV APPROACH;  Surgeon: Lauralee Evener, MD;  Location: MAIN OR [facility];  Service: ENT    PR REMOVAL NODES, NECK,CERV MOD RAD Bilateral 07/28/2020    Procedure: CERVICAL LYMPHADENECTOMY (MODIFIED RADICAL NECK DISSECTION);  Surgeon: Lauralee Evener, MD;  Location: MAIN OR [facility];  Service: ENT    PR THYROIDECTOMY,MALIG,LTD NECK SURG Bilateral 04/12/2020    Procedure: THYROIDECTOMY, TOTAL OR SUBTOTAL FOR MALIGNANCY; WITH LIMITED NECK DISSECTION;  Surgeon: Lauralee Evener, MD;  Location: MAIN OR [facility];  Service: ENT       Current Outpatient Medications   Medication Sig Dispense Refill    adalimumab (HUMIRA PEN CROHNS-UC-HS START) 40 mg/0.8 mL injection PEN Inject the contents of 1 pen (40 mg total) under the skin once a week. Maintenance dose 4 each 5    amlodipine (NORVASC) 10 MG tablet Take 1 tablet (10 mg total) by mouth daily. 90 tablet 3    atorvastatin (LIPITOR) 80 MG tablet Take 1 tablet (80 mg total) by mouth nightly. 90 tablet 2    blood sugar diagnostic (ACCU-CHEK GUIDE TEST STRIPS) Strp Use to check blood glucose once daily. (Patient not taking: Reported on 04/19/2022) 100 strip 1    blood-glucose meter kit Test blood glucose level once daily. (Patient not taking: Reported on 04/19/2022) 1 each 0    clindamycin (CLEOCIN T) 1 % lotion Apply topically two (2) times a day. 60 mL 11    doxycycline (VIBRA-TABS) 100 MG tablet Take 1 tablet (100 mg total) by mouth two (2) times a day. Take with food. 168 tablet 0    empty container Misc Use as directed to dispose of Humira pens. 1 each 2    gabapentin (NEURONTIN) 300 MG capsule Take 1 capsule (300 mg total) by mouth two (2) times a day. 180 capsule 2    HUMIRA PEN CITRATE FREE STARTER PACK FOR CROHN'S/UC/HS 3 X 80 MG/0.8 ML Inject the contents of 2 pens (160 mg) on day 1, THEN 1 pen (80 mg) on day 15. 3 each 0    ibuprofen (MOTRIN) 600 MG tablet Take 1 tablet (600 mg total) by mouth Three (3) times a day as needed for pain. 30 tablet 1    insulin glargine (BASAGLAR, LANTUS) 100 unit/mL (3 mL) injection pen Inject 0.3 mL (30 Units total) under the skin nightly. 15 mL 2    lancets Misc  Use to check blood glucose once daily. (Patient not taking: Reported on 04/19/2022) 100 each 1    lenvatinib (LENVIMA) 18 mg/day (10 mg x 1 AND 4 mg x 2) cap Take one (10mg ) capsule plus two (4mg ) capsules by mouth daily for a total of 18mg  daily. 90 capsule 2    levothyroxine (SYNTHROID) 125 MCG tablet Take 2 tablets (250 mcg total) by mouth daily. 180 tablet 3    losartan-hydroCHLOROthiazide (HYZAAR) 100-25 mg per tablet Take 1 tablet by mouth daily. 90 tablet 3    metFORMIN (GLUCOPHAGE) 500 MG tablet Take 2 tablets (1,000 mg total) by mouth in the morning and 2 tablets (1,000 mg total) in the evening. Take with meals. 360 tablet 2    pen needle, diabetic 32 gauge x 5/32 (4 mm) Ndle Use with insulin glargine once daily (Patient not taking: Reported on 04/19/2022) 100 each 1    spironolactone (ALDACTONE) 25 MG tablet Take 1 tablet (25 mg total) by mouth daily. 90 tablet 3     No current facility-administered medications for this visit.       Allergies   Allergen Reactions    Penicillin Anaphylaxis    Fish Derived     Lisinopril     Peanut Butter Flavor      Per allergy testing    Penicillins Other (See Comments)     Per patient, when he took at age 41 his legs and arms shook. Not sure if it was seizure or not.     Shellfish Containing Products     Soy        Social History     Tobacco Use    Smoking status: Some Days     Types: Cigarettes     Passive exposure: Current    Smokeless tobacco: Never   Vaping Use    Vaping status: Never Used   Substance Use Topics    Alcohol use: Yes    Drug use: Not Currently     Types: Marijuana Comment: quit in 1992       Social History     Social History Narrative    Not on file       Family History   Problem Relation Age of Onset    Melanoma Neg Hx     Basal cell carcinoma Neg Hx     Squamous cell carcinoma Neg Hx        Review of Systems: A 12-system review of systems was obtained including: Constitutional, Eyes, ENT, Cardiovascular, Respiratory, GI, GU, Musculoskeletal, Skin, Neurological, Psychiatric, Endocrine, Heme/Lymphatic, and Allergic/Immunologic systems. It is negative or non-contributory to the patient???s management except for as stated in patient's HPI    ECOG PS: 0    Physical Examination:  Vital Signs: There were no vitals taken for this visit.  CONSTITUTIONAL: Well appearing man; NAD  Oral Cavity: MMM, no oral lesions or mucositis  Lymphatics: No lymphadenopathy   CV: RRR; no lower extremity edema  RESP: normal work of breathing  GI: Soft, non-tender, non-distended  SKIN: No skin rashes; groin not examined today  NEURO: no focal deficits appreciated  PSYCH: Normal mood and appropriate affect    LABS  Lab Results   Component Value Date    WBC 5.1 06/20/2022    HGB 13.7 06/20/2022    HCT 41.5 06/20/2022    PLT 254 06/20/2022       Lab Results   Component Value Date    NA 136 06/20/2022    K  3.6 06/20/2022    CL 99 06/20/2022    CO2 30.0 06/20/2022    BUN 17 06/20/2022    CREATININE 0.98 06/20/2022    GLU 267 (H) 06/20/2022    CALCIUM 9.3 06/20/2022    MG 1.6 07/31/2020    PHOS 3.0 07/31/2020       Lab Results   Component Value Date    BILITOT 0.3 06/20/2022    PROT 8.1 06/20/2022    ALBUMIN 3.5 06/20/2022    ALT 26 06/20/2022    AST 44 (H) 06/20/2022    ALKPHOS 94 06/20/2022       No results found for: PT, INR, APTT      IMAGING  CT Neck (05/30/2022)  --Asymmetric enlargement of the right palatine tonsil abutting the uvula without discrete mass identified, nonspecific could be secondary to patient positioning. Consider further evaluation with direct visualization or PET CT.  --No definite recurrent disease within the soft tissues of the neck.  --Similar appearance of the bilateral pulmonary metastases.    CT Chest (05/30/2022)  New subcentimeter pulmonary nodules in the right lower lobe which could represent metastases. Other previously present numerous bilateral metastatic nodules are unchanged.    PATHOLOGY  THYROID GLAND  8th Edition - Protocol posted: 03/27/2018   THYROID GLAND: RESECTION - All Specimens  Clinical History  No known radiation exposure    SPECIMEN   Procedure  Total thyroidectomy    TUMOR   Tumor Focality  Multifocal    Tumor Characteristics     Tumor Site  Right lobe      Left lobe      Isthmus    Histologic Type  Papillary carcinoma, classic (usual, conventional)    Tumor Size  Greatest Dimension (Centimeters): 3.1 cm   Extrathyroidal Extension  Not identified    Angioinvasion (vascular invasion)  Not identified    Lymphatic Invasion  Not identified    Perineural Invasion  Not identified    Margins  Uninvolved by carcinoma    Distance of Invasive Carcinoma from Closest Margin (Millimeters)  1 mm   Mitotic Rate  4 Mitoses per 2 mm^2   LYMPH NODES   Number of Lymph Nodes Involved  14    Nodal Levels Involved  Level VI    Size of Largest Metastatic Deposit (Centimeters)  5 cm   Extranodal Extension (ENE)  Present    Number of Lymph Nodes Examined  14    Nodal Levels Examined  Level VI    PATHOLOGIC STAGE CLASSIFICATION (pTNM, AJCC 8th Edition)   TNM Descriptors  m (multiple primary tumors)         Primary Tumor (pT)  pT2    Regional Lymph Nodes (pN)  pN1a    Distant Metastasis (pM)  pM1    Site(s)  lung see [id number]      I have personally reviewed relevant imaging, laboratory values, existing medical records, and pathology. I have summarized these findings in the oncology history above.    ***

## 2022-08-01 NOTE — Unmapped (Signed)
[facility] Clinical Assessment & Refill Coordination Note    Paul Hines, DOB: 1970-09-13  Phone: There are no phone numbers on file.    All above HIPAA information was verified with patient.     Was a Nurse, learning disability used for this call? No    Specialty Medication(s):   Hematology/Oncology: Lenvima and Inflammatory Disorders: Humira     Current Outpatient Medications   Medication Sig Dispense Refill    adalimumab (HUMIRA PEN CROHNS-UC-HS START) 40 mg/0.8 mL injection PEN Inject the contents of 1 pen (40 mg total) under the skin once a week. Maintenance dose 4 each 5    amlodipine (NORVASC) 10 MG tablet Take 1 tablet (10 mg total) by mouth daily. 90 tablet 3    atorvastatin (LIPITOR) 80 MG tablet Take 1 tablet (80 mg total) by mouth nightly. 90 tablet 2    blood sugar diagnostic (ACCU-CHEK GUIDE TEST STRIPS) Strp Use to check blood glucose once daily. (Patient not taking: Reported on 04/19/2022) 100 strip 1    blood-glucose meter kit Test blood glucose level once daily. (Patient not taking: Reported on 04/19/2022) 1 each 0    clindamycin (CLEOCIN T) 1 % lotion Apply topically two (2) times a day. 60 mL 11    doxycycline (VIBRA-TABS) 100 MG tablet Take 1 tablet (100 mg total) by mouth two (2) times a day. Take with food. 168 tablet 0    empty container Misc Use as directed to dispose of Humira pens. 1 each 2    gabapentin (NEURONTIN) 300 MG capsule Take 1 capsule (300 mg total) by mouth two (2) times a day. 180 capsule 2    HUMIRA PEN CITRATE FREE STARTER PACK FOR CROHN'S/UC/HS 3 X 80 MG/0.8 ML Inject the contents of 2 pens (160 mg) on day 1, THEN 1 pen (80 mg) on day 15. 3 each 0    ibuprofen (MOTRIN) 600 MG tablet Take 1 tablet (600 mg total) by mouth Three (3) times a day as needed for pain. 30 tablet 1    insulin glargine (BASAGLAR, LANTUS) 100 unit/mL (3 mL) injection pen Inject 0.3 mL (30 Units total) under the skin nightly. 15 mL 2    lancets Misc Use to check blood glucose once daily. (Patient not taking: Reported on 04/19/2022) 100 each 1    lenvatinib (LENVIMA) 18 mg/day (10 mg x 1 AND 4 mg x 2) cap Take one (10mg ) capsule plus two (4mg ) capsules by mouth daily for a total of 18mg  daily. 90 capsule 2    levothyroxine (SYNTHROID) 125 MCG tablet Take 2 tablets (250 mcg total) by mouth daily. 180 tablet 3    losartan-hydroCHLOROthiazide (HYZAAR) 100-25 mg per tablet Take 1 tablet by mouth daily. 90 tablet 3    metFORMIN (GLUCOPHAGE) 500 MG tablet Take 2 tablets (1,000 mg total) by mouth in the morning and 2 tablets (1,000 mg total) in the evening. Take with meals. 360 tablet 2    pen needle, diabetic 32 gauge x 5/32 (4 mm) Ndle Use with insulin glargine once daily (Patient not taking: Reported on 04/19/2022) 100 each 1    spironolactone (ALDACTONE) 25 MG tablet Take 1 tablet (25 mg total) by mouth daily. 90 tablet 3     No current facility-administered medications for this visit.        Changes to medications: [name] reports no changes at this time.    Allergies   Allergen Reactions    Penicillin Anaphylaxis  Fish Derived     Lisinopril     Peanut Butter Flavor      Per allergy testing    Penicillins Other (See Comments)     Per patient, when he took at age 9 his legs and arms shook. Not sure if it was seizure or not.     Shellfish Containing Products     Soy        Changes to allergies: No    SPECIALTY MEDICATION ADHERENCE     Humira 40mg /0.96mL: 0 days of medicine on hand   Lenvima 18mg /day: 0 days of medicine on hand   Medication Adherence    Patient reported X missed doses in the last month: 0  Specialty Medication: Humira40mg / 0.40mL and Lenvima 18mg /day  Informant: patient  Confirmed plan for next specialty medication refill: delivery by pharmacy  Refills needed for supportive medications: not needed          Specialty medication(s) dose(s) confirmed: Regimen is correct and unchanged.     Are there any concerns with adherence? No    Adherence counseling provided? Not needed    CLINICAL MANAGEMENT AND INTERVENTION      Clinical Benefit Assessment:    Do you feel the medicine is effective or helping your condition? Yes    Clinical Benefit counseling provided? Not needed    Adverse Effects Assessment:    Are you experiencing any side effects? No    Are you experiencing difficulty administering your medicine? No    Quality of Life Assessment:    Quality of Life    Rheumatology  Oncology  1. What impact has your specialty medication had on the reduction of your daily pain or discomfort level?: Some  2. On a scale of 1-10, how would you rate your ability to manage side effects associated with your specialty medication? (1=no issues, 10 = unable to take medication due to side effects): 5  Dermatology  1. What impact has your specialty medication had on the symptoms of your skin condition (i.e. itchiness, soreness, stinging)?: Some  2. What impact has your specialty medication had on your comfort level with your skin?: Some  Cystic Fibrosis          How many days over the past month did your HS  keep you from your normal activities? For example, brushing your teeth or getting up in the morning. Patient declined to answer    Have you discussed this with your provider? Not needed    Acute Infection Status:    Acute infections noted within Epic:  No active infections  Patient reported infection: None    Therapy Appropriateness:    Is therapy appropriate and patient progressing towards therapeutic goals? Yes, therapy is appropriate and should be continued    DISEASE/MEDICATION-SPECIFIC INFORMATION      For patients on injectable medications: Patient currently has 0 doses left.  Next injection is scheduled for 08/02/2022.    Chronic Inflammatory Diseases: Have you experienced any flares in the last month? No  Has this been reported to your provider? Not applicable  Oncology: Is the patient receiving adequate infection prevention treatment? Not applicable  Does the patient have adequate nutritional support? Not applicable    PATIENT SPECIFIC NEEDS     Does the patient have any physical, cognitive, or cultural barriers? No    Is the patient high risk? No    Did the patient require a clinical intervention? No    Does the patient require physician intervention or  other additional services (i.e., nutrition, smoking cessation, social work)? No    SOCIAL DETERMINANTS OF HEALTH     At the [facility], we have learned that life circumstances - like trouble affording food, housing, utilities, or transportation can affect the health of many of our patients.   That is why we wanted to ask: are you currently experiencing any life circumstances that are negatively impacting your health and/or quality of life? Patient declined to answer    Social Determinants of Health     Financial Resource Strain: Low Risk  (07/29/2020)    Overall Financial Resource Strain (CARDIA)     Difficulty of Paying Living Expenses: Not hard at all   Internet Connectivity: Not on file   Food Insecurity: No Food Insecurity (07/29/2020)    Hunger Vital Sign     Worried About Running Out of Food in the Last Year: Never true     Ran Out of Food in the Last Year: Never true   Tobacco Use: High Risk (05/30/2022)    Patient History     Smoking Tobacco Use: Some Days     Smokeless Tobacco Use: Never     Passive Exposure: Current   Housing/Utilities: Low Risk  (07/29/2020)    Housing/Utilities     Within the past 12 months, have you ever stayed: outside, in a car, in a tent, in an overnight shelter, or temporarily in someone else's home (i.e. couch-surfing)?: No     Are you worried about losing your housing?: No     Within the past 12 months, have you been unable to get utilities (heat, electricity) when it was really needed?: No   Alcohol Use: Alcohol Misuse (08/22/2021)    Received from [facility], [facility]    AUDIT-C     Frequency of Alcohol Consumption: 2-3 times a week     Average Number of Drinks: 5 or 6     Frequency of Binge Drinking: Weekly   Transportation Needs: Unmet Transportation Needs (05/26/2022)    PRAPARE - Transportation     Lack of Transportation (Medical): Yes     Lack of Transportation (Non-Medical): Not on file   Substance Use: Not on file   Health Literacy: Not on file   Physical Activity: Not on file   Interpersonal Safety: Not on file   Stress: Not on file   Intimate Partner Violence: Not on file   Depression: Not at risk (04/19/2022)    PHQ-2     PHQ-2 Score: 0   Social Connections: Not on file       Would you be willing to receive help with any of the needs that you have identified today? Not applicable       SHIPPING     Specialty Medication(s) to be Shipped:   Hematology/Oncology: [name] and Inflammatory Disorders: Humira    Other medication(s) to be shipped:  atorvastatin, levothyroxine, losartan-hydrochlorothiazide, metformin, spironolactone     Changes to insurance: No    Delivery Scheduled: Yes, Expected medication delivery date: 08/02/2022.     Medication will be delivered via Same Day Courier to the confirmed prescription address in [facility].    The patient will receive a drug information handout for each medication shipped and additional FDA Medication Guides as required.  Verified that patient has previously received a Conservation officer, historic buildings and a Surveyor, mining.    The patient or caregiver noted above participated in the development of this care plan  and knows that they can request review of or adjustments to the care plan at any time.      All of the patient's questions and concerns have been addressed.    Elnora Morrison, PharmD   [facility] Specialty Pharmacist

## 2022-08-02 MED FILL — LOSARTAN 100 MG-HYDROCHLOROTHIAZIDE 25 MG TABLET: ORAL | 90 days supply | Qty: 90 | Fill #0

## 2022-08-02 MED FILL — LEVOTHYROXINE 125 MCG TABLET: ORAL | 45 days supply | Qty: 90 | Fill #0

## 2022-08-02 MED FILL — HUMIRA PEN 40 MG/0.8 ML SUBCUTANEOUS KIT: SUBCUTANEOUS | 28 days supply | Qty: 4 | Fill #0

## 2022-08-02 MED FILL — ATORVASTATIN 80 MG TABLET: ORAL | 90 days supply | Qty: 90 | Fill #2

## 2022-08-02 MED FILL — LENVIMA 18 MG/DAY (10 MG X 1 AND 4 MG X 2) CAPSULE: ORAL | 30 days supply | Qty: 90 | Fill #1

## 2022-08-02 MED FILL — METFORMIN 500 MG TABLET: ORAL | 90 days supply | Qty: 360 | Fill #2

## 2022-08-02 NOTE — Unmapped (Signed)
Patient [facility] Cancer Support Encounter    Mr. Deford called the [facility] to ask for assistance contacting the [facility] because medications had still not arrived. Transferred Mr. Viau to the [facility]. Called Mr. Arrant back to confirm that he received the delivery.     [name], [name], RN, [facility]   She/Her/Hers  Oncology Nurse Navigator - [name] [facility]  [facility]  [address] [place], [place] [address]    Center: [phone]  Voicemail: [phone]  [email or web address]

## 2022-08-08 ENCOUNTER — Other Ambulatory Visit: Admit: 2022-08-08 | Discharge: 2022-08-08 | Payer: PRIVATE HEALTH INSURANCE

## 2022-08-08 ENCOUNTER — Ambulatory Visit: Admit: 2022-08-08 | Discharge: 2022-08-08 | Payer: PRIVATE HEALTH INSURANCE | Attending: Oncology | Primary: Oncology

## 2022-08-08 ENCOUNTER — Ambulatory Visit
Admit: 2022-08-08 | Discharge: 2022-08-08 | Payer: PRIVATE HEALTH INSURANCE | Attending: Student in an Organized Health Care Education/Training Program | Primary: Student in an Organized Health Care Education/Training Program

## 2022-08-08 DIAGNOSIS — E119 Type 2 diabetes mellitus without complications: Principal | ICD-10-CM

## 2022-08-08 DIAGNOSIS — Z794 Long term (current) use of insulin: Principal | ICD-10-CM

## 2022-08-08 DIAGNOSIS — C73 Malignant neoplasm of thyroid gland: Principal | ICD-10-CM

## 2022-08-08 LAB — CBC W/ AUTO DIFF
BASOPHILS ABSOLUTE COUNT: 0 10*9/L (ref 0.0–0.1)
BASOPHILS RELATIVE PERCENT: 0.9 %
EOSINOPHILS ABSOLUTE COUNT: 0.1 10*9/L (ref 0.0–0.5)
EOSINOPHILS RELATIVE PERCENT: 2.6 %
HEMATOCRIT: 39.7 % (ref 39.0–48.0)
HEMOGLOBIN: 12.8 g/dL — ABNORMAL LOW (ref 12.9–16.5)
LYMPHOCYTES ABSOLUTE COUNT: 1.6 10*9/L (ref 1.1–3.6)
LYMPHOCYTES RELATIVE PERCENT: 27.6 %
MEAN CORPUSCULAR HEMOGLOBIN CONC: 32.3 g/dL (ref 32.0–36.0)
MEAN CORPUSCULAR HEMOGLOBIN: 24 pg — ABNORMAL LOW (ref 25.9–32.4)
MEAN CORPUSCULAR VOLUME: 74.3 fL — ABNORMAL LOW (ref 77.6–95.7)
MEAN PLATELET VOLUME: 8.9 fL (ref 6.8–10.7)
MONOCYTES ABSOLUTE COUNT: 0.4 10*9/L (ref 0.3–0.8)
MONOCYTES RELATIVE PERCENT: 6.4 %
NEUTROPHILS ABSOLUTE COUNT: 3.6 10*9/L (ref 1.8–7.8)
NEUTROPHILS RELATIVE PERCENT: 62.5 %
PLATELET COUNT: 249 10*9/L (ref 150–450)
RED BLOOD CELL COUNT: 5.34 10*12/L (ref 4.26–5.60)
RED CELL DISTRIBUTION WIDTH: 15.6 % — ABNORMAL HIGH (ref 12.2–15.2)
WBC ADJUSTED: 5.7 10*9/L (ref 3.6–11.2)

## 2022-08-08 LAB — COMPREHENSIVE METABOLIC PANEL
ALBUMIN: 3.1 g/dL — ABNORMAL LOW (ref 3.4–5.0)
ALKALINE PHOSPHATASE: 77 U/L (ref 46–116)
ALT (SGPT): 24 U/L (ref 10–49)
ANION GAP: 1 mmol/L — ABNORMAL LOW (ref 5–14)
AST (SGOT): 33 U/L (ref <=34)
BILIRUBIN TOTAL: 0.4 mg/dL (ref 0.3–1.2)
BLOOD UREA NITROGEN: 10 mg/dL (ref 9–23)
BUN / CREAT RATIO: 9
CALCIUM: 9.2 mg/dL (ref 8.7–10.4)
CHLORIDE: 108 mmol/L — ABNORMAL HIGH (ref 98–107)
CO2: 30 mmol/L (ref 20.0–31.0)
CREATININE: 1.06 mg/dL
EGFR CKD-EPI (2021) MALE: 83 mL/min/{1.73_m2} (ref >=60)
GLUCOSE RANDOM: 173 mg/dL (ref 70–179)
POTASSIUM: 4.1 mmol/L (ref 3.4–4.8)
PROTEIN TOTAL: 6.9 g/dL (ref 5.7–8.2)
SODIUM: 139 mmol/L (ref 135–145)

## 2022-08-08 LAB — TSH: THYROID STIMULATING HORMONE: 27.547 u[IU]/mL — ABNORMAL HIGH (ref 0.550–4.780)

## 2022-08-08 LAB — T4, FREE: FREE T4: 1.25 ng/dL (ref 0.89–1.76)

## 2022-08-08 LAB — SLIDE REVIEW

## 2022-08-08 MED ORDER — LEVOTHYROXINE 300 MCG TABLET
ORAL_TABLET | Freq: Every day | ORAL | 3 refills | 90 days | Status: CP
Start: 2022-08-08 — End: 2023-08-08
  Filled 2022-08-08: qty 90, 90d supply, fill #0

## 2022-08-08 MED ORDER — CARVEDILOL 6.25 MG TABLET
ORAL_TABLET | Freq: Two times a day (BID) | ORAL | 3 refills | 90 days | Status: CP
Start: 2022-08-08 — End: 2023-08-08
  Filled 2022-08-08: qty 180, 90d supply, fill #0

## 2022-08-08 NOTE — Unmapped (Addendum)
Blood pressure medications to take every day:  - spironolactone  - amlodipine  - losartan - hydrochlorothiazide    We will add another blood pressure medicine called carvedilol. Take 1 pill in the morning and 1 pill at night.    We are increasing the dose of your thyroid medicine (levothyroxine). You will now just take 1 pill in the morning which is 300 mcg.     I'll ask the skin doctors to call you about the Humira shots.

## 2022-08-08 NOTE — Unmapped (Unsigned)
Labs drawn via butterfly & sent for analysis.  Patient sent to next appointment. Care provided by Nelly Wheeler, RN

## 2022-08-08 NOTE — Unmapped (Signed)
Clinical Pharmacist Practitioner: [facility]    Patient Name: Paul Hines  Patient Age: 52 y.o.  Encounter Date: 08/08/2022  Primary Oncologist: Paul Otto, DO, MPH    Reason for visit: oral chemotherapy follow-up    ASSESSMENT & PLAN:  1. Metastatic PTC: patient started on Lenvima on 02/21/21 and he is overall tolerating well with stable fatigue. Last CT scans on 05/30/2022 showed mostly SD. He reports improved adherence with Lenvima. TG has been slowly increasing since January; repeat TG today pending.  Continue Lenvima 18 mg daily  Repeat scans in 6 weeks    Thyroglobulin, Tumor Marker, IA (ng/mL)   Date Value   06/20/2022 147 (H)   03/15/2022 107 (H)   02/01/2022 70 (H)   10/05/2021 662 (H)   06/15/2021 502 (H)     2. HTN: clinic BP 171/109 today. Not checking BP at home. Reports he is taking all 3 BP meds. We discussed dose reducing Lenvima vs adding additional antihypertensive and he does not want to Paul Lenvima. We discussed importance of reducing BP and if we cannot get better control we will have to Paul Lenvima to 14 mg. He is seeing FM clinic to establish care next month and we will work closely with them.   Start carvedilol 6.25 mg BID  Continue amlodipine 10 mg daily, Hyzaar 100-25 mg daily, and spironalactone 25 mg daily    3. Type 2 diabetes: poorly controlled. Hgb A1c 11.4% on 06/20/22, up from 9.8% on 02/01/22. Continues on metformin and basal insulin 30 units nightly. Reports improved adherence with taking metformin BID and more consistent use of insulin. Not checking BG at home and requests CGM system, however Medicaid will not cover unless patient is on two or more insulin injections per day. Will defer further management to FM who he is seeing next month.  Continue Basaglar 30 units qhs and continue metformin 1000 mg BID  Encouraged testing BG at least once daily and recording values to bring to follow up appointments    4. Hidrandenitis suppurativa: followed by dermatology. Completed doxy course and reports recent flare up. Only took 2 starter doses of Humira in May and did not start maintenance doses. Will reach out to dermatology to follow-up with him.  Next follow up with dermatology on 09/06/22    5. Postsurgical hypothyroidism: goal for TSH suppression (<0.1). TSH improved to 27 today from 52 and free T4 now WNL.   Increase levothyroxine to 300 mcg daily    6. Peripheral neuropathy: likely 2/2 to T2DM. Stable since last visit.  Continue gabapentin 300 mg BID    7. Neck pain: improved with prn ibuprofen.  Continue ibuprofen 600 mg q6h prn    8. H/o ischemic cardiac disease s/p catheterization: refer to IM clinic. Lipid panel on 10/05/21 elevated with cholesterol 239, HDL 100, LDL 115, non HDL 161.   Continue atorvastatin 80 mg daily    F/U: RTC in 6 weeks for scans and appt w/ Paul Hines    ______________________________________________________________________    HPI: Paul Hines is a 52 y.o. male with history of T2DM, hypertension, ischemic cardiac disease s/p catheterization in 2021 and metastatic thyroid cancer, s/p resection with Paul. Azucena Fallen on 04/12/20 and neck dissection on 07/28/20. Started Lenvima on 02/21/21 although has had issues with noncompliance.     Oral chemotherapy: Lenvima 18 mg daily  Start date: 02/21/21   Specialty pharmacy: [facility]  Copay: $4    Interim History:   -  overall feeling well with minimal side effects from Lenvima  - reports he is doing much better with adherence from meds  - clinic BP 171/109, denies HA or vision changes, confirms he is taking 3 BP meds  - trying to modify his diet to reduce sugar intake  - HS symptoms have worsened after completing doxy course, has only done 2 Humira shots which were a couple months ago  - denies diarrhea, constipation, mucositis, HFS  - reports energy is a little better    Adherence: confirms taking appropriately, no missed doses over past 3 weeks  Drug-Drug Interactions: none per current med list    Oncology History:  Hematology/Oncology History   Thyroid cancer (CMS-HCC)   03/09/2020 Initial Diagnosis    Thyroid cancer (CMS-HCC)     07/29/2021 -  Cancer Staged    Staging form: Thyroid - Differentiated, AJCC 8th Edition  - Clinical: Stage II (cM1, Age at diagnosis: < 55 years) - Signed by Dionicia Abler, DO on 07/29/2021           Problem List:   Patient Active Problem List   Diagnosis    Abnormal computed tomography angiography (CTA)    Thyroid cancer (CMS-HCC)    Obesity    Right inguinal hernia    Diabetes (CMS-HCC)    Essential hypertension    Hidradenitis suppurativa    Postoperative hypothyroidism       Medications:  Current Outpatient Medications   Medication Sig Dispense Refill    adalimumab (HUMIRA PEN CROHNS-UC-HS START) 40 mg/0.8 mL injection PEN Inject the contents of 1 pen (40 mg total) under the skin once a week. Maintenance dose 4 each 5    amlodipine (NORVASC) 10 MG tablet Take 1 tablet (10 mg total) by mouth daily. 90 tablet 3    atorvastatin (LIPITOR) 80 MG tablet Take 1 tablet (80 mg total) by mouth nightly. 90 tablet 2    blood sugar diagnostic (ACCU-CHEK GUIDE TEST STRIPS) Strp Use to check blood glucose once daily. (Patient not taking: Reported on 04/19/2022) 100 strip 1    blood-glucose meter kit Test blood glucose level once daily. (Patient not taking: Reported on 04/19/2022) 1 each 0    carvedilol (COREG) 6.25 MG tablet Take 1 tablet (6.25 mg total) by mouth two (2) times a day. 180 tablet 3    clindamycin (CLEOCIN T) 1 % lotion Apply topically two (2) times a day. 60 mL 11    doxycycline (VIBRA-TABS) 100 MG tablet Take 1 tablet (100 mg total) by mouth two (2) times a day. Take with food. 168 tablet 0    empty container Misc Use as directed to dispose of Humira pens. 1 each 2    gabapentin (NEURONTIN) 300 MG capsule Take 1 capsule (300 mg total) by mouth two (2) times a day. 180 capsule 2    HUMIRA PEN CITRATE FREE STARTER PACK FOR CROHN'S/UC/HS 3 X 80 MG/0.8 ML Inject the contents of 2 pens (160 mg) on day 1, THEN 1 pen (80 mg) on day 15. 3 each 0    ibuprofen (MOTRIN) 600 MG tablet Take 1 tablet (600 mg total) by mouth Three (3) times a day as needed for pain. 30 tablet 1    insulin glargine (BASAGLAR, LANTUS) 100 unit/mL (3 mL) injection pen Inject 0.3 mL (30 Units total) under the skin nightly. 15 mL 2    lancets Misc Use to check blood glucose once daily. (Patient not taking: Reported on 04/19/2022) 100 each 1  lenvatinib (LENVIMA) 18 mg/day (10 mg x 1 AND 4 mg x 2) cap Take one (10mg ) capsule plus two (4mg ) capsules by mouth daily for a total of 18mg  daily. 90 capsule 2    levothyroxine (SYNTHROID) 300 MCG tablet Take 1 tablet (300 mcg total) by mouth daily. 90 tablet 3    losartan-hydroCHLOROthiazide (HYZAAR) 100-25 mg per tablet Take 1 tablet by mouth daily. 90 tablet 3    metFORMIN (GLUCOPHAGE) 500 MG tablet Take 2 tablets (1,000 mg total) by mouth in the morning and 2 tablets (1,000 mg total) in the evening. Take with meals. 360 tablet 2    pen needle, diabetic 32 gauge x 5/32 (4 mm) Ndle Use with insulin glargine once daily (Patient not taking: Reported on 04/19/2022) 100 each 1    spironolactone (ALDACTONE) 25 MG tablet Take 1 tablet (25 mg total) by mouth daily. 90 tablet 3     No current facility-administered medications for this visit.       Allergies:  Allergies   Allergen Reactions    Penicillin Anaphylaxis    Fish Derived     Lisinopril     Peanut Butter Flavor      Per allergy testing    Penicillins Other (See Comments)     Per patient, when he took at age 9 his legs and arms shook. Not sure if it was seizure or not.     Shellfish Containing Products     Soy        Personal and Social History:   Social History     Tobacco Use    Smoking status: Some Days     Types: Cigarettes     Passive exposure: Current    Smokeless tobacco: Never   Substance Use Topics    Alcohol use: Yes   He reports that he does not currently use drugs after having used the following drugs: Marijuana.    Family History:  His family history is not on file.    Review of Systems: A complete review of systems was obtained including: Constitutional, Eyes, ENT, Cardiovascular, Respiratory, GI, GU, Musculoskeletal, Skin, Neurological, Psychiatric, Endocrine, Heme/Lymphatic, and Allergic/Immunologic systems. All other systems reviewed and are negative to the patient???s management except for what was mentioned in the interim history.     Vital Signs: BP (S) 171/109  - Pulse 75  - Temp 37 ??C (98.6 ??F) (Temporal)  - Resp 16  - Ht 170.2 cm (5' 7)  - Wt 76.7 kg (169 lb)  - SpO2 100%  - BMI 26.47 kg/m??     Laboratory Data:  Lab on 08/08/2022   Component Date Value    Sodium 08/08/2022 139     Potassium 08/08/2022 4.1     Chloride 08/08/2022 108 (H)     CO2 08/08/2022 30.0     Anion Gap 08/08/2022 1 (L)     BUN 08/08/2022 10     Creatinine 08/08/2022 1.06     BUN/Creatinine Ratio 08/08/2022 9     eGFR CKD-EPI (2021) Male 08/08/2022 83     Glucose 08/08/2022 173     Calcium 08/08/2022 9.2     Albumin 08/08/2022 3.1 (L)     Total Protein 08/08/2022 6.9     Total Bilirubin 08/08/2022 0.4     AST 08/08/2022 33     ALT 08/08/2022 24     Alkaline Phosphatase 08/08/2022 77     Free T4 08/08/2022 1.25     TSH 08/08/2022 27.547 (H)  WBC 08/08/2022 5.7     RBC 08/08/2022 5.34     HGB 08/08/2022 12.8 (L)     HCT 08/08/2022 39.7     MCV 08/08/2022 74.3 (L)     MCH 08/08/2022 24.0 (L)     MCHC 08/08/2022 32.3     RDW 08/08/2022 15.6 (H)     MPV 08/08/2022 8.9     Platelet 08/08/2022 249     Neutrophils % 08/08/2022 62.5     Lymphocytes % 08/08/2022 27.6     Monocytes % 08/08/2022 6.4     Eosinophils % 08/08/2022 2.6     Basophils % 08/08/2022 0.9     Absolute Neutrophils 08/08/2022 3.6     Absolute Lymphocytes 08/08/2022 1.6     Absolute Monocytes 08/08/2022 0.4     Absolute Eosinophils 08/08/2022 0.1     Absolute Basophils 08/08/2022 0.0     Microcytosis 08/08/2022 Slight (A)     Hypochromasia 08/08/2022 Marked (A)     Smear Review Comments 08/08/2022 See Comment (A)         I spent 25 minutes with Mr.Kohli in direct patient care.    Bobby Rumpf, PharmD, BCOP, CPP  Clinical Pharmacist Practitioner, Melanoma and Head & Neck Oncology

## 2022-08-09 NOTE — Unmapped (Signed)
Patient [facility] Cancer Support Encounter    Paul Hines called the Patient [facility] asking for assistance connecting with the person who is to provide teaching on his injection. Contacted Paul Hines, his pharmacist, who clarified it is Dr. Guy Sandifer dermatology team. She already contacted the dermatology team and they plan to call Paul Hines directly. Called Paul Hines back to update him.    Paul Hines, [name], RN, [facility]   She/Her/Hers  Oncology Nurse Navigator - [name] [facility]  [facility]  [address], [place], [place] [address]    Center: [phone]  Voicemail: [phone]  [email or web address]

## 2022-08-12 NOTE — Unmapped (Addendum)
SW counseled Paul Hines, 52 y.o. for treatment of tobacco use/dependence. Pt had expressed interest in counseling via automated phone program.    SUMMARY: SW assessed pt's current tobacco use and history of use, and used MI to elicit motivation. SW provided evidence-based treatment for tobacco use, including discussing triggers, behavioral strategies, and tobacco cessation medications. Pt endorsed current daily tobacco use of 20 cpd. Pt expressed interest and motivation in tobacco cessation. Pt shared he is interested in quitting due to his health. Pt reported his throat hurts when he smokes currently. Pt shared he does not gain the same benefits he used to from smoking and at this point it is just a habit he cannot break. Pt is interested in learning more about cessation medications. SW and pt discussed education regarding standard-of-care medications for tobacco cessation, including Varenicline, Bupropion, and Nicotine Replacement Therapy. Pt shared he has tried the patches in the past with success and is interested in trying them again. SW reviewed potential side effects of the nicotine patch as well as common nicotine withdrawal symptoms. Pt confirmed preferred pharmacy within chart and SW sent prescription request to provider. SW and pt discussed behavioral strategies pt could adopt to help mitigate his urges and reduce his tobacco use (listed below). SW and pt reviewed psychoeducation regarding behavioral techniques that can be used in tobacco cessation. SW made plans to follow up with pt. Pt has SW's number if additional needs arise.    Tobacco Use Treatment  Program: [facility]  Type of Visit: Initial  Session Number: 1  Tobacco Use Treatment Visit: Talked with patient  Permission To Engage In Conversation Re: PHI w/ Visitors Present: n/a  Goals Of Session: Insight, increase, Assessment, Communication of feelings, Self-esteem, improve, Behavior management, improve    Cancer Center Patients  Primary Service: Other (TA)  Primary Cancer Type: Head and Neck    Tobacco Use During Past 30 Days  Time Since Last Tobacco Use: smoked a cigarette today (at least one puff)  Tobacco Withdrawal (Past 24 Hours): None noted  Type of Tobacco Products Used: Cigarettes  Quantity Used: 20  Quantity Per: day  Time to First Use After Waking: Immediately  Other Household Members Use Tobacco: Yes  Smoking Allowed in Home: Yes  Smoking Allowed in Vehicles: N/A  Smoke During Work Day: N/A    Tobacco Use History  Most Recent Attempt: Currently working on  Medications Used in Past Attempts: None    Behavioral Assessment  Why Uses: 1. Long Standing Habit 2. Stress 3. Those around him smoke  Reasons to Become Tobacco Free: 1. Health  Barriers/Challenges: 1. Habit 2. Stress 3. roommate smokes  Strategies: 1. Nicotine patch 2. Mindfulness 3. Behavioral Friction    Treatment Plan  Cessation Meds Currently Using: None  Outpatient/Discharge Medications Recommended: Patch 21mg   Plan to Obtain Outpatient Meds: TTS messaged providers for Rx  Patient's Plan Post Discharge/Visit: Plan to quit as soon as possible  Follow-up Plan: Permission given, Phone follow-up scheduled  Family Members Included in Intervention/Plan: No    TTS Information  Diagnosis: Tobacco use disorder, unspecified, uncomplicated (F17.200)  Interventions: Assessed, Discussed, Informed, Motivational interviewing, Taught, Tx plan development, Encouraged  TTS Visit Length: Psychotherapy >16 min    Talan Gildner Valentina Lucks, LCSW-A  Tobacco Treatment Counselor  [facility] Tobacco Treatment Program  [phone]

## 2022-08-16 DIAGNOSIS — C73 Malignant neoplasm of thyroid gland: Principal | ICD-10-CM

## 2022-08-16 NOTE — Unmapped (Addendum)
Patient Paul Hines Cancer Support Encounter    Paul Hines called the Patient & [facility]. Provided active listening and emotional support as patient discussed current needs and challenges.    Spoke with Paul Hines on 7/12 and was to have a follow-up call on 7/15. Phone was shut off and he missed call. Asked for assistance getting in touch with Paul Hines again. Called and left voicemail for Paul Hines to connect with Paul Hines again.     Paul Hines mentioned he has had significantly increased appetite lately. Continues to do Ensure shakes to help with protein intake. Discussed meeting with oncology dietitian to help with new appetite needs. Referral placed.     Paul Hines also mentioned adding exercise into his daily routine again now that he is feeling better. Discussed Get Real and Heel program. Sent message to [name] and [name] to initiate intake. Did notify [name] of transportation barriers and that I can help Paul Hines find the appropriate bus to get him to intake appt once arranged.     [name], [name], RN, [name]   She/Her/Hers  Oncology Nurse Navigator - [name] [facility]  [facility]  [address], [place], [place] [address]    Hines: [phone]  Voicemail: [phone]  Zahari [email or web address]

## 2022-08-18 MED ORDER — NICOTINE 21 MG/24 HR DAILY TRANSDERMAL PATCH
MEDICATED_PATCH | TRANSDERMAL | 4 refills | 28 days | Status: CP
Start: 2022-08-18 — End: ?
  Filled 2022-08-29: qty 28, 28d supply, fill #0

## 2022-08-21 NOTE — Unmapped (Signed)
Patient [facility] Cancer Support Encounter    Mr. Toren called and left a voicemail asking for a return call. Called Mr. Barse back. No answer but left voicemail asking for return call.      Mr. Zenk returned call. Had questions about Get Real and Heel program. Clarified for him and let him know that once he is arranged for an introductory appt, to let me know and I can help him figure out public transportation. Also discussed some of his concerns with documents that [facility] needs. Mr. Montee unsure of which documents they need and if they were going to be mailed or emailed to him. Mr. Folino asked if we can conference call later this week to discuss with them. Reports that he needs to find his wallet and insurance card first. Told him to call me when he is ready to call them.     [name], [name], RN, [facility]   She/Her/Hers  Oncology Nurse Navigator - [name] Patient [facility]  [facility]  [address] [place], [place] [address]    Center: [phone]  Voicemail: [phone]  Barrett [email or web address]

## 2022-08-23 NOTE — Unmapped (Signed)
Patient [facility] Cancer Support Encounter    Mr. Beiler called and left voicemail requesting a callback. Called Mr. Durkee back. On the bus at the time of call. He asked that he call back when he arrives home. Awaiting return call.    Mr. Guillory called back. Unable to conference call [facility] while at the front desk. Told him I can call him back when I am back in my office.     Called Mr. Stano back. Arranged rides with [facility] for upcoming appointments on 7/29 (confirmation [id number]) and 8/7 (confirmation [id number]).    Plans to call me tomorrow to discuss necessary documents that [facility] needs from his providers.     [name], [name], RN, [facility]   She/Her/Hers  Oncology Nurse Navigator - [name] Patient [facility]  [facility]  [address], [place], [place] [address]    Center: [phone]  Voicemail: [phone]  Jaevian Shean.[email or web address]

## 2022-08-23 NOTE — Unmapped (Signed)
[facility] Refill Coordination Note    Specialty Medication(s) to be Shipped:   Hematology/Oncology: Paul Hines 18mg /day and Inflammatory Disorders: Humira 40mg /0.9ml    Other medication(s) to be shipped:  lantus solostar, nicotine patch     Paul Hines, DOB: 09-20-1970  Phone: There are no phone numbers on file.      All above HIPAA information was verified with patient.     Was a Nurse, learning disability used for this call? No    Completed refill call assessment today to schedule patient's medication shipment from the [facility] [phone]).  All relevant notes have been reviewed.     Specialty medication(s) and dose(s) confirmed: Regimen is correct and unchanged.   Changes to medications: Paul Hines reports no changes at this time.  Changes to insurance: No  New side effects reported not previously addressed with a pharmacist or physician: None reported  Questions for the pharmacist: No    Confirmed patient received a Conservation officer, historic buildings and a Surveyor, mining with first shipment. The patient will receive a drug information handout for each medication shipped and additional FDA Medication Guides as required.       DISEASE/MEDICATION-SPECIFIC INFORMATION        N/A    SPECIALTY MEDICATION ADHERENCE     Medication Adherence    Patient reported X missed doses in the last month: 0  Specialty Medication: HUMIRA PEN 40 mg/0.8 mL injection PEN (adalimumab)  Patient is on additional specialty medications: Yes  Additional Specialty Medications: LENVIMA 18 mg/day (10 mg x 1-4 mg x2) Cap (lenvatinib)  Patient Reported Additional Medication X Missed Doses in the Last Month: 0  Patient is on more than two specialty medications: No  Informant: patient              Were doses missed due to medication being on hold? No    Humira 40  mg/0.53ml : 7 days of medicine on hand   Lenvima 18  mg/day : 10 days of medicine on hand       REFERRAL TO PHARMACIST     Referral to the pharmacist: Not needed      [facility]     Shipping address confirmed in Epic.       Delivery Scheduled: Yes, Expected medication delivery date: 08/29/22.     Medication will be delivered via Same Day Courier to the prescription address in Epic WAM.    Jasper Loser   [facility] Specialty Technician

## 2022-08-24 NOTE — Unmapped (Signed)
Patient [facility] Cancer Support Encounter    [place], [facility] coordinator, updated me that intake forms have been emailed to Mr. Castles. Mr. Vogan attempted to pull up on email but was unsuccessful. Contacted [name] to see if forms can be sent to me and I can have Mr. Hasbun fill out while he is here on Monday.     [name], [name], RN, [facility]   She/Her/Hers  Oncology Nurse Navigator - [name] Patient [facility]  [facility]  [address], [place], [place] [address]    Center: [phone]  Voicemail: [phone]  Tevyn [email or web address]

## 2022-08-28 ENCOUNTER — Ambulatory Visit
Admit: 2022-08-28 | Discharge: 2022-08-29 | Payer: PRIVATE HEALTH INSURANCE | Attending: Registered" | Primary: Registered"

## 2022-08-28 NOTE — Unmapped (Signed)
Patient [name] Cancer Support Encounter    Met with patient in the Patient & [facility]. Paul Hines wants to participate in the Get Real and Heel Program. Paul Hines, program coordinator, sent Paul Hines the form via email. Was unable to complete form at home, so completed in Patient [facility]. Form submitted and Paul Hines updated.      Paul Hines, [name], RN, [facility]   She/Her/Hers  Oncology Nurse Navigator - [name] Patient [facility]  [facility]  [address], [place], [place] [address]    Center: [phone]  Voicemail: [phone]  Paul [email or web address]

## 2022-08-29 MED FILL — HUMIRA PEN 40 MG/0.8 ML SUBCUTANEOUS KIT: SUBCUTANEOUS | 28 days supply | Qty: 4 | Fill #1

## 2022-08-29 MED FILL — LENVIMA 18 MG/DAY (10 MG X 1 AND 4 MG X 2) CAPSULE: ORAL | 30 days supply | Qty: 90 | Fill #2

## 2022-08-29 MED FILL — LANTUS SOLOSTAR U-100 INSULIN 100 UNIT/ML (3 ML) SUBCUTANEOUS PEN: SUBCUTANEOUS | 50 days supply | Qty: 15 | Fill #0

## 2022-08-29 NOTE — Unmapped (Signed)
Patient [facility] Cancer Support Encounter    Paul Hines called Patient & [facility] asking for assistance with contacting [facility] because medications have not yet been delivered. Conference called [facility] and told that medications are confirmed to be delivered later today. Paul Hines had not other questions for the pharmacy.    Paul Hines, [name], RN, [facility]   She/Her/Hers  Oncology Nurse Navigator - [name] Patient [facility]  [facility]  [address] [place], [place] [address]    Center: [phone]  Voicemail: [phone]  Paul Spatafore.[email or web address]

## 2022-08-30 NOTE — Unmapped (Signed)
Patient [facility] Cancer Support Encounter    Mr. Widrick called the Patient & [facility] because medications were not delivered yesterday. Conference called [employer]. Manager and carrier service unable to track package. Awaiting return call.      Received update from [facility]. Medication sent through [employer] with tracking that showed it was delivered this morning. Called Mr. Stennis to update him. Has not been at his house since early this morning. Reports that he will call if he does not find the delivery when he arrives home.     [name], [name], RN, [facility]   She/Her/Hers  Oncology Nurse Navigator - [name] Patient [facility]  [facility]  [address], [place], [place] [address]    Center: [phone]  Voicemail: [phone]  Sumner Kirchman.[email or web address]

## 2022-08-31 NOTE — Unmapped (Signed)
Patient [facility] Cancer Support Encounter    Mr. Campillo called the Patient & [facility] to update me that he received his medication shipment. Mr. Favila requested that we call [facility] to clarify what form needs to be completed for diagnosis information. Conference called [facility]. [facility] reports that it is the Department of Social Security that would be the appropriate contact. Called [employer] of Social Security. Left callback request.     [name], [name], RN, [facility]   She/Her/Hers  Oncology Nurse Navigator - [name] Patient [facility]  [facility]  [address] [place], [place] [address]    Center: [phone]  Voicemail: [phone]  Acea [email or web address]

## 2022-09-04 NOTE — Unmapped (Signed)
Patient [facility] Cancer Support Encounter    Paul Hines called and left a voicemail with a callback request. Called Paul Hines. Reports that he went to the Department of Social Services to update his address and ask more questions about the necessary paperwork they needed. Updated me that they said they do not need that paperwork after all. Reviewed upcoming appts. Reminded Paul Hines that transportation has already been arranged for appts this week.     Paul Hines, [name], RN, [facility]   She/Her/Hers  Oncology Nurse Navigator - [name] Patient [facility]  [facility]  [address], [place], [place] [address]    Center: [phone]  Voicemail: [phone]  Paul Hines.[email or web address]

## 2022-09-06 ENCOUNTER — Ambulatory Visit: Admit: 2022-09-06 | Payer: PRIVATE HEALTH INSURANCE | Attending: Dermatology | Primary: Dermatology

## 2022-09-06 ENCOUNTER — Ambulatory Visit: Admit: 2022-09-06 | Payer: PRIVATE HEALTH INSURANCE

## 2022-09-08 NOTE — Unmapped (Signed)
Provided follow-up counseling to Paul Hines, 52 y.o. for treatment of tobacco use/dependence.     SUMMARY: SW re-assessed pt's tobacco use status and followed up about treatment plan. Pt endorsed current daily tobacco use of 4 cpd. Pt expressed continued interest and motivation in tobacco cessation. Pt reported the nicotine patches are working well. Pt denied current side effects as well as nicotine withdrawal symptoms. SW and pt discussed behavioral strategies pt could adopt to help mitigate his urges and reduce his tobacco use (listed below). SW and pt reviewed psychoeducation regarding behavioral techniques that can be used in tobacco cessation. Pt shared he is proud of himself and feels confident in his quit attempt. SW celebrated [name] and encouraged pt's continued hard work to reach his goal of becoming tobacco free. SW made plans to follow up with pt. Pt has SW's number if additional needs arise.    Tobacco Use Treatment  Program: [facility]  Type of Visit: Follow-up  Session Number: 2  Tobacco Use Treatment Visit: Talked with patient  Permission To Engage In Conversation Re: PHI w/ Visitors Present: n/a  Goals Of Session: Insight, increase, Assessment, Communication of feelings, Self-esteem, improve, Stress management, increase, Behavior management, improve    Cancer Center Patients  Primary Service: Other (TA)  Primary Cancer Type: Head and Neck    Tobacco Use During Past 30 Days  Time Since Last Tobacco Use: smoked a cigarette today (at least one puff)  Tobacco Withdrawal (Past 24 Hours): None noted  Type of Tobacco Products Used: Cigarettes  Quantity Used: 4  Quantity Per: day  Time to First Use After Waking: Immediately  Other Household Members Use Tobacco: Yes  Smoking Allowed in Home: Yes  Smoking Allowed in Vehicles: N/A  Smoke During Work Day: N/A    Tobacco Use History  Most Recent Attempt: Currently working on  Medications Used in Past Attempts: None    Behavioral Assessment  Why Uses: 1. Long Standing Habit 2. Stress 3. Those around him smoke  Reasons to Become Tobacco Free: 1. Health  Barriers/Challenges: 1. Habit 2. Stress  Strategies: 1. Nicotine patch 2. Mindfulness 3. Behavioral Friction    Treatment Plan  Cessation Meds Currently Using: Patch 21mg   Outpatient/Discharge Medications Recommended: Patch 21mg   Plan to Obtain Outpatient Meds: Pt already has medication  Patient's Plan Post Discharge/Visit: Plan to quit as soon as possible  Follow-up Plan: Permission given, Phone follow-up scheduled  Family Members Included in Intervention/Plan: No    TTS Information  Diagnosis: Tobacco use disorder, unspecified, uncomplicated (F17.200)  Interventions: Assessed, Discussed, Informed, Motivational interviewing, Taught, Suggested, Encouraged  TTS Visit Length: 3-10 minutes    Paul Yokoyama Valentina Lucks, LCSW-A  Tobacco Treatment Counselor  [facility] Tobacco Treatment Program  [phone]

## 2022-09-11 NOTE — Unmapped (Signed)
Patient [facility] Cancer Support Encounter    Received voicemail while I was out of office that Mr. Machon missed appointments last week due to missing his Medicaid ride. Called Mr. Mccullar back and left a voicemail with my name, role, direct phone number, and request for a callback. Awaiting return call.     Mr. Woolverton called back. Called Dermatology and Family Medicine to reschedule appointments. Called Medicaid transportation to arrange transportation for 8/21 appointment. Reference number [id number] with pickup between 11:55 and 12:25pm.     [name], [name], RN, [facility]   She/Her/Hers  Oncology Nurse Navigator - [name] Patient [facility]  [facility]  [address], [place], [place] [address]    Center: [phone]  Voicemail: [phone]  Jetson [email or web address]

## 2022-09-12 MED ORDER — PEN NEEDLE, DIABETIC 32 GAUGE X 5/32" (4 MM)
1 refills | 0 days | Status: CP
Start: 2022-09-12 — End: ?

## 2022-09-12 MED ORDER — ALCOHOL SWABS
0 refills | 0 days | Status: CP
Start: 2022-09-12 — End: ?
  Filled 2022-09-14: qty 100, 33d supply, fill #0

## 2022-09-12 NOTE — Unmapped (Signed)
Patient [facility] Cancer Support Encounter    Patient called Patient & [facility] and left message that he received insulin from pharmacy with no injection tips or alcohol swabs. Called Mr. Choate back and conference called [facility]. Arranged delivery for Thursday, 8/15. Mr. Mcgowen has no other needs at this time.    [name], [name], RN, [facility]   She/Her/Hers  Oncology Nurse Navigator - [name] Patient [facility]  [facility]  [address] [place], [place] [address]    Center: [phone]  Voicemail: [phone]  Salome [email or web address]

## 2022-09-14 MED FILL — ULTICARE PEN NEEDLE 32 GAUGE X 5/32" (4 MM): 90 days supply | Qty: 100 | Fill #0
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
## 2022-09-20 ENCOUNTER — Ambulatory Visit: Admit: 2022-09-20 | Discharge: 2022-09-20 | Payer: PRIVATE HEALTH INSURANCE

## 2022-09-20 ENCOUNTER — Ambulatory Visit
Admit: 2022-09-20 | Discharge: 2022-09-20 | Payer: PRIVATE HEALTH INSURANCE | Attending: Student in an Organized Health Care Education/Training Program | Primary: Student in an Organized Health Care Education/Training Program

## 2022-09-20 MED ADMIN — iohexol (OMNIPAQUE) 350 mg iodine/mL solution 75 mL: 75 mL | INTRAVENOUS | @ 17:00:00 | Stop: 2022-09-20

## 2022-09-21 DIAGNOSIS — C799 Secondary malignant neoplasm of unspecified site: Principal | ICD-10-CM

## 2022-09-21 DIAGNOSIS — C73 Malignant neoplasm of thyroid gland: Principal | ICD-10-CM

## 2022-09-21 MED ORDER — LENVIMA 18 MG/DAY (10 MG X 1 AND 4 MG X 2) CAPSULE
ORAL_CAPSULE | Freq: Every day | ORAL | 2 refills | 30 days
Start: 2022-09-21 — End: ?

## 2022-09-21 NOTE — Unmapped (Signed)
[facility] Refill Coordination Note    Specialty Medication(s) to be Shipped:   Hematology/Oncology: Paul Hines and Inflammatory Disorders: Humira    Other medication(s) to be shipped: No additional medications requested for fill at this time     Paul Hines, DOB: 1970/09/16  Phone: [phone] (home)       All above HIPAA information was verified with patient.     Was a Nurse, learning disability used for this call? No    Completed refill call assessment today to schedule patient's medication shipment from the [facility] [phone]).  All relevant notes have been reviewed.     Specialty medication(s) and dose(s) confirmed: Regimen is correct and unchanged.   Changes to medications: Paul Hines reports no changes at this time.  Changes to insurance: No  New side effects reported not previously addressed with a pharmacist or physician: None reported  Questions for the pharmacist: No    Confirmed patient received a Conservation officer, historic buildings and a Surveyor, mining with first shipment. The patient will receive a drug information handout for each medication shipped and additional FDA Medication Guides as required.       DISEASE/MEDICATION-SPECIFIC INFORMATION        For patients on injectable medications: Patient currently has 1 doses left.  Next injection is scheduled for 09/23/22.    SPECIALTY MEDICATION ADHERENCE     Medication Adherence    Patient reported X missed doses in the last month: 0  Specialty Medication: adalimumab (HUMIRA PEN CROHNS-UC-HS START) 40 mg/0.8 mL injection PEN  Patient is on additional specialty medications: Yes  Additional Specialty Medications: LENVIMA 18 mg/day (10 mg x 1-4 mg x2) Cap (lenvatinib)  Patient Reported Additional Medication X Missed Doses in the Last Month: 0  Patient is on more than two specialty medications: No  Any gaps in refill history greater than 2 weeks in the last 3 months: no  Demonstrates understanding of importance of adherence: yes  Informant: patient  Reliability of informant: reliable  Provider-estimated medication adherence level: good  Patient is at risk for Non-Adherence: No  Reasons for non-adherence: no problems identified              Were doses missed due to medication being on hold? No    LENVIMA 18 mg/day (10 mg x 1-4 mg x2) Cap (lenvatinib)  : 7 days of medicine on hand   HUMIRA PEN 40 mg/0.8 mL injection PEN (adalimumab)  : 1 doses of medicine on hand       REFERRAL TO PHARMACIST     Referral to the pharmacist: Not needed      SHIPPING     Shipping address confirmed in Epic.       Delivery Scheduled: Yes, Expected medication delivery date: 09/25/22.  However, Rx request for refills was sent to the provider as there are none remaining.     Medication will be delivered via Same Day Courier to the prescription address in Epic WAM.    Paul Hines' W Paul Hines Shared [facility] Specialty Technician

## 2022-09-25 DIAGNOSIS — C799 Secondary malignant neoplasm of unspecified site: Principal | ICD-10-CM

## 2022-09-25 DIAGNOSIS — C73 Malignant neoplasm of thyroid gland: Principal | ICD-10-CM

## 2022-09-25 MED ORDER — LENVIMA 18 MG/DAY (10 MG X 1 AND 4 MG X 2) CAPSULE
ORAL_CAPSULE | Freq: Every day | ORAL | 2 refills | 30 days | Status: CP
Start: 2022-09-25 — End: ?
  Filled 2022-09-26: qty 90, 30d supply, fill #0

## 2022-09-25 MED FILL — HUMIRA PEN 40 MG/0.8 ML SUBCUTANEOUS KIT: SUBCUTANEOUS | 28 days supply | Qty: 4 | Fill #2

## 2022-09-25 NOTE — Unmapped (Signed)
Paul Hines 's LENVIMA 18 mg/day (10 mg x 1-4 mg x2) Cap (lenvatinib) shipment will be delayed as a result of no refills remain on the prescription.      I have reached out to the patient  at [phone]  and left a voicemail message.  We will call the patient back to reschedule the delivery upon resolution. We have not confirmed the new delivery date.

## 2022-09-26 NOTE — Unmapped (Signed)
Lu Duffel 's Lenvima shipment will be rescheduled as a result of a new prescription for the medication has been received.      I have spoken with the patient  at [phone]  and communicated the delivery change. We will reschedule the medication for the delivery date that the patient agreed upon.  We have confirmed the delivery date as 8/27, via same day courier.

## 2022-09-26 NOTE — Unmapped (Signed)
Patient [facility] Cancer Support Encounter    Called Mr. Blackley to discuss upcoming appointments and transportation needs. Mr Dudoit was at work and asked that I arrange rides without him. Confirmed insurance, address, and best callback number have not changed.    Contacted [facility] [employer] to arrange rides. Called Mr. Gehres and HIPAA-compliant voicemail that rides have been arranged and that I will text the pickup times his way. HIPAA-compliant text sent with ride pickup times.    Sept 3rd appt: pickup at 7:00am, reference number: [id number]  Sept 4th appt: pickup at 11:30am, reference number: [id number]  Sept 19th appt: pickup at 7:35am, reference number: [id number]    [name], [name], RN, [facility]   She/Her/Hers  Oncology Nurse Navigator - [name] [facility]  [facility]  [address] [place], [place] [address]    Center: [phone]  Voicemail: [phone]  [email or web address]

## 2022-09-28 NOTE — Unmapped (Signed)
Patient [facility] Cancer Support Encounter    Paul Hines called to confirm upcoming appointments and rides. Reports that he received the text message with pickup information.     [name], [name], RN, [facility]   She/Her/Hers  Oncology Nurse Navigator - [name] [name]  [facility]  [address], [place], [place] [address]    Center: [phone]  Voicemail: [phone]  [name]

## 2022-10-03 ENCOUNTER — Ambulatory Visit
Admit: 2022-10-03 | Discharge: 2022-10-04 | Payer: PRIVATE HEALTH INSURANCE | Attending: Dermatology | Primary: Dermatology

## 2022-10-03 DIAGNOSIS — L732 Hidradenitis suppurativa: Principal | ICD-10-CM

## 2022-10-03 DIAGNOSIS — Z79899 Other long term (current) drug therapy: Principal | ICD-10-CM

## 2022-10-03 NOTE — Unmapped (Signed)
Patient [facility] Cancer Support Encounter    Mr. Tubb called the [facility] to review appointments for the week. Reviewed appointments and rides with Mr. Groenewold.    [name], [name], RN, [name]   She/Her/Hers  Oncology Nurse Navigator - [name] Patient [facility]  [facility]  [address], [place], [place] [address]    Center: [phone]  Voicemail: [phone]  [email or web address]

## 2022-10-03 NOTE — Unmapped (Signed)
Start zyrtec 10mg daily

## 2022-10-03 NOTE — Unmapped (Signed)
Dermatology Note    Assessment and Plan:      Hidradenitis suppurativa, severe, Hurley III, Chronic: flared or not at treatment goal  Current stage 4 metastatic thyroid cancer being treated with lenvatinib   - We discussed the typical natural history, pathogenesis, treatment options, and expected course as well as the relapsing and sometimes recalcitrant nature of the disease and potential for sinus tract formation and scarring.    - We discussed associated comorbid conditions (pilonidal cysts, IBD, psoriasis, acne, dissecting cellulitis)  - Discussed that combination of medical and surgical therapy is often required for treatment.   - Reviewed association between HS and tobacco use; encouraged cessation  - Reviewed role of antibiotics in HS to decrease inflammation   - Discussed that the medication he is on for his metastatic thyroid cancer (lenvatinib) can sometimes cause lesions to flare  - Patient has a history of uncontrolled diabetes mellitus (most recent A1C 9.8 on 02/01/22), encouraged glycemic control as this can also help with disease activity  - Referring oncologist, Dr. Ginette Otto Hemant, questioned him for increasing humira dose vs starting cosentyx     Plan:  - consider increasing humira to 80mg  weekly vs starting cosentyx, favor cosentyx given underlying malignancy.   - continue current humira 40mg  weekly for now  - will refer to Dr Janyth Contes for unroofing procedure consultation  - Continue doxycycline 100 mg BID PRN for flares, refilled today   - Discussed potential side effects including sun sensitivity, allergic reaction, GI upset including esophagitis and diarrhea and to stop the medication and call if these should occur.   - continue clindamycin (CLEOCIN T) 1 % lotion; Apply topically two (2) times a day.  - Continue Hibiclens wash qday in shower     High risk medication use, Humira  - Quantiferon TB negative March 2024  - hepatitis panel within normal limits 2024    The patient was advised to call for an appointment should any new, changing, or symptomatic lesions develop.     RTC: Return in about 3 months (around 01/02/2023) for HS follow up. or sooner as needed   _________________________________________________________________      Chief Complaint     Chief Complaint   Patient presents with    HS      Pt here for follow up HS, having flare, mainly on buttocks, occurring for more than 1wks or more, taking Humira       HPI     Paul Hines is a 52 y.o. male who presents as a returning patient (last seen No previous visit found) for follow up of hidradenitis suppurativa. Patient on humira. Flaring in buttocks region. Patient notices flares are worse when he gets lenvatinib.     Pertinent Past Medical History     Problem List          Musculoskeletal and Integument    Hidradenitis suppurativa - Primary     Past Medical History, Family History, Social History, Medication List, Allergies, and Problem List were reviewed in the rooming section of Epic.     ROS: Other than symptoms mentioned in the HPI, no fevers, chills, or other skin complaints    Physical Examination     GENERAL: Well-appearing male in no acute distress, resting comfortably.  NEURO: Alert and oriented, answers questions appropriately  PSYCH: Normal mood and affect  EYES: lids clear, conjunctiva pink, no scleral icterus or other color change  RESP: No increased work of breathing  SKIN (Focal Skin Exam):  Per patient request, examination of groin, buttocks was performed    Draining sinus tract on lower abdomen  Numerous inflamed nodules and tunnels to bilateral inguinal creases, groin, and buttocks with significant scarring     All areas not commented on are within normal limits or unremarkable    (Approved Template 10/13/2019)

## 2022-10-04 ENCOUNTER — Other Ambulatory Visit: Admit: 2022-10-04 | Discharge: 2022-10-05 | Payer: PRIVATE HEALTH INSURANCE

## 2022-10-04 ENCOUNTER — Ambulatory Visit
Admit: 2022-10-04 | Discharge: 2022-10-05 | Payer: PRIVATE HEALTH INSURANCE | Attending: Adult Health | Primary: Adult Health

## 2022-10-04 DIAGNOSIS — C73 Malignant neoplasm of thyroid gland: Principal | ICD-10-CM

## 2022-10-04 DIAGNOSIS — L732 Hidradenitis suppurativa: Principal | ICD-10-CM

## 2022-10-04 LAB — COMPREHENSIVE METABOLIC PANEL
ALBUMIN: 2.9 g/dL — ABNORMAL LOW (ref 3.4–5.0)
ALKALINE PHOSPHATASE: 92 U/L (ref 46–116)
ALT (SGPT): 12 U/L (ref 10–49)
ANION GAP: 6 mmol/L (ref 5–14)
AST (SGOT): 22 U/L (ref <=34)
BILIRUBIN TOTAL: 0.2 mg/dL — ABNORMAL LOW (ref 0.3–1.2)
BLOOD UREA NITROGEN: 16 mg/dL (ref 9–23)
BUN / CREAT RATIO: 19
CALCIUM: 9.2 mg/dL (ref 8.7–10.4)
CHLORIDE: 106 mmol/L (ref 98–107)
CO2: 26 mmol/L (ref 20.0–31.0)
CREATININE: 0.85 mg/dL
EGFR CKD-EPI (2021) MALE: >90 mL/min/{1.73_m2} (ref >=60)
GLUCOSE RANDOM: 372 mg/dL — ABNORMAL HIGH (ref 70–179)
POTASSIUM: 4.6 mmol/L (ref 3.4–4.8)
PROTEIN TOTAL: 7 g/dL (ref 5.7–8.2)
SODIUM: 138 mmol/L (ref 135–145)

## 2022-10-04 LAB — CBC W/ AUTO DIFF
BASOPHILS ABSOLUTE COUNT: 0 10*9/L (ref 0.0–0.1)
BASOPHILS RELATIVE PERCENT: 0.6 %
EOSINOPHILS ABSOLUTE COUNT: 0.1 10*9/L (ref 0.0–0.5)
EOSINOPHILS RELATIVE PERCENT: 2.1 %
HEMATOCRIT: 38.9 % — ABNORMAL LOW (ref 39.0–48.0)
HEMOGLOBIN: 12.9 g/dL (ref 12.9–16.5)
LYMPHOCYTES ABSOLUTE COUNT: 1.8 10*9/L (ref 1.1–3.6)
LYMPHOCYTES RELATIVE PERCENT: 26.2 %
MEAN CORPUSCULAR HEMOGLOBIN CONC: 33 g/dL (ref 32.0–36.0)
MEAN CORPUSCULAR HEMOGLOBIN: 24.3 pg — ABNORMAL LOW (ref 25.9–32.4)
MEAN CORPUSCULAR VOLUME: 73.5 fL — ABNORMAL LOW (ref 77.6–95.7)
MEAN PLATELET VOLUME: 9 fL (ref 6.8–10.7)
MONOCYTES ABSOLUTE COUNT: 0.6 10*9/L (ref 0.3–0.8)
MONOCYTES RELATIVE PERCENT: 8 %
NEUTROPHILS ABSOLUTE COUNT: 4.4 10*9/L (ref 1.8–7.8)
NEUTROPHILS RELATIVE PERCENT: 63.1 %
PLATELET COUNT: 243 10*9/L (ref 150–450)
RED BLOOD CELL COUNT: 5.29 10*12/L (ref 4.26–5.60)
RED CELL DISTRIBUTION WIDTH: 14.1 % (ref 12.2–15.2)
WBC ADJUSTED: 7 10*9/L (ref 3.6–11.2)

## 2022-10-04 LAB — T4, FREE: FREE T4: 1.97 ng/dL — ABNORMAL HIGH (ref 0.89–1.76)

## 2022-10-04 LAB — TSH: THYROID STIMULATING HORMONE: 0.023 u[IU]/mL — ABNORMAL LOW (ref 0.550–4.780)

## 2022-10-04 LAB — SLIDE REVIEW

## 2022-10-04 MED ORDER — AMLODIPINE 10 MG TABLET
ORAL | 3 refills | 90 days | Status: CP
Start: 2022-10-04 — End: ?
  Filled 2022-10-04: qty 90, 90d supply, fill #0

## 2022-10-04 MED ORDER — COSENTYX UNOREADY PEN 300 MG/2 ML (150 MG/ML) SUBCUTANEOUS
SUBCUTANEOUS | 11 refills | 0.00000 days | Status: CP
Start: 2022-10-04 — End: ?
  Filled 2022-10-09: qty 8, 28d supply, fill #0

## 2022-10-04 MED ORDER — IBUPROFEN 600 MG TABLET
ORAL | 1 refills | 10 days | Status: CP | PRN
Start: 2022-10-04 — End: ?
  Filled 2022-10-04: qty 30, 10d supply, fill #0

## 2022-10-04 MED ORDER — HYDROXYZINE HCL 25 MG TABLET
ORAL_TABLET | Freq: Three times a day (TID) | ORAL | 0 refills | 10 days | Status: CP | PRN
Start: 2022-10-04 — End: ?
  Filled 2022-10-04: qty 30, 10d supply, fill #0

## 2022-10-04 MED ORDER — GABAPENTIN 300 MG CAPSULE
ORAL_CAPSULE | Freq: Two times a day (BID) | ORAL | 2 refills | 90 days | Status: CP
Start: 2022-10-04 — End: ?
  Filled 2022-10-04: qty 180, 90d supply, fill #0

## 2022-10-04 MED ORDER — LEVOTHYROXINE 300 MCG TABLET
ORAL_TABLET | Freq: Every day | ORAL | 3 refills | 90 days | Status: CP
Start: 2022-10-04 — End: 2023-10-04
  Filled 2022-11-30: qty 90, 90d supply, fill #0

## 2022-10-04 MED ORDER — CETIRIZINE 10 MG TABLET
ORAL_TABLET | Freq: Every day | ORAL | 2 refills | 30 days | Status: CP
Start: 2022-10-04 — End: 2023-10-04
  Filled 2022-10-04: qty 30, 30d supply, fill #0

## 2022-10-04 MED ORDER — LOSARTAN 100 MG-HYDROCHLOROTHIAZIDE 25 MG TABLET
ORAL_TABLET | Freq: Every day | ORAL | 3 refills | 90 days | Status: CP
Start: 2022-10-04 — End: 2023-10-04
  Filled 2022-11-30: qty 90, 90d supply, fill #0

## 2022-10-04 MED ORDER — SPIRONOLACTONE 25 MG TABLET
ORAL_TABLET | Freq: Every day | ORAL | 3 refills | 90 days | Status: CP
Start: 2022-10-04 — End: ?
  Filled 2022-12-19: qty 90, 90d supply, fill #0

## 2022-10-04 NOTE — Unmapped (Signed)
Provided follow-up counseling to Lu Duffel, 52 y.o. for treatment of tobacco use/dependence.     SUMMARY: SW re-assessed pt's tobacco use status and followed up about treatment plan. Pt endorsed current daily tobacco use of 7 cpd. Pt expressed continued interest and motivation in tobacco cessation. Pt shared he does not believe the nicotine patch is working as well as it was to begin with. Pt reported he is still smoking less than before the patch but has gone up by a few cigarettes in the last week or two. SW and pt discussed education regarding standard-of-care medications for tobacco cessation, including combined. nicotine replacement therapy. SW and pt discussed the potential side effects and common nicotine withdrawal symptoms. Pt expressed interest in trying nicotine gum. SW confirmed pt's pharmacy in the chart and sent prescription request to provider. Pt requested refill of the patch and this was sent as well. SW and pt discussed behavioral strategies pt could adopt to help mitigate his urges and reduce his tobacco use (listed below). SW and pt reviewed psychoeducation regarding behavioral techniques that can be used in tobacco cessation. Pt reported he feels well supported otherwise and has no other additional needs at the moment. SW made plans to follow up with pt. Pt has SW's number if additional needs arise.    Tobacco Use Treatment  Program: [facility]  Type of Visit: Follow-up  Session Number: 3  Tobacco Use Treatment Visit: Talked with patient  Permission To Engage In Conversation Re: PHI w/ Visitors Present: n/a  Goals Of Session: Insight, increase, Assessment, Communication of feelings, Self-esteem, improve, Anxiety, decrease, Behavior management, improve    Cancer Center Patients  Primary Service: Other (TA)  Primary Cancer Type: Head and Neck    Tobacco Use During Past 30 Days  Time Since Last Tobacco Use: smoked a cigarette today (at least one puff)  Tobacco Withdrawal (Past 24 Hours): None noted  Type of Tobacco Products Used: Cigarettes  Quantity Used: 7  Quantity Per: day  Time to First Use After Waking: Immediately  Other Household Members Use Tobacco: Yes  Smoking Allowed in Home: Yes  Smoking Allowed in Vehicles: N/A  Smoke During Work Day: N/A    Tobacco Use History  Most Recent Attempt: Currently working on  Medications Used in Past Attempts: None    Behavioral Assessment  Why Uses: 1. Long Standing Habit 2. Stress 3. Those around him smoke  Reasons to Become Tobacco Free: 1. Health  Barriers/Challenges: 1. Habit 2. Stress  Strategies: 1. Nicotine patch and gum 2. Mindfulness 3. Behavioral Friction    Treatment Plan  Cessation Meds Currently Using: Patch 21mg   Outpatient/Discharge Medications Recommended: Patch 21mg , Gum 4mg   Plan to Obtain Outpatient Meds: TTS messaged providers for Rx  Patient's Plan Post Discharge/Visit: Plan to quit as soon as possible  Follow-up Plan: Permission given, Phone follow-up scheduled  Family Members Included in Intervention/Plan: No    TTS Information  Diagnosis: Tobacco use disorder, unspecified, uncomplicated (F17.200)  Interventions: Assessed, Discussed, Informed, Motivational interviewing, Suggested, Taught, Cognitive restructuring, Tx plan development, Encouraged  TTS Visit Length: 3-10 minutes    Orest Dygert Valentina Lucks, LCSW-A  Tobacco Treatment Counselor  [facility] Tobacco Treatment Program  [phone]

## 2022-10-04 NOTE — Unmapped (Signed)
Patient [facility] Cancer Support Encounter    Met with patient in the Patient & [facility]. Provided active listening and emotional support as patient discussed current needs and challenges.    Called [employer] to discuss restarting phone assistance. Told that Mr. Lersch needs to re-apply, but will not need to pay for an additional phone since his 30 days of service lapsed. Completed application (Application ID: [id number]) and was approved. Should receive [employer] phone within 7-10 days to Mr. Coye confirmed home address.    Mr. Fioretti reports that he is locked out of MyChart. Assisted with MyChart set-up on phone and able to reset password.    Met with Arline Asp, CPAF director, in [facility] to discuss CPAF assistance with pharmacy co-payments. Updated Arline Asp that Mr. Mahood will receive [employer] phone without need for payment.     Reports that food and drink has been more difficult to swallow lately. Does not have difficulty with swallowing his own saliva, but does feel like his dysphagia is worsening. Saw oncology team today. Will update Hazel/Marco, ONNs, of symptom.     [name], [name], RN, [facility]   She/Her/Hers  Oncology Nurse Navigator - [name] Patient [facility]  [facility]  [address], [place], [place] [address]    Center: [phone]  Voicemail: [phone]  [email or web address]

## 2022-10-05 NOTE — Unmapped (Signed)
[facility] Specialty Medication Onboarding    Specialty Medication: Cosentyx  Prior Authorization: Approved   Financial Assistance: No - copay  <$25  Final Copay/Day Supply: $4 / 35 days (LD)          $4 / 28 days (MD)    Insurance Restrictions: None     Notes to Pharmacist:   Credit Card on File: no    The triage team has completed the benefits investigation and has determined that the patient is able to fill this medication at [facility]. Please contact the patient to complete the onboarding or follow up with the prescribing physician as needed.

## 2022-10-05 NOTE — Unmapped (Signed)
[facility]   Patient Onboarding/Medication Counseling    Switch from Humira to Cosentyx d/t not being at treatment goal and oncologist recommended switch to Cosentyx vs. Increasing humira d/t current thyroid cancer    Paul Hines is a 52 y.o. male with HS who I am counseling today on initiation of therapy.  I am speaking to the patient.    Was a Nurse, learning disability used for this call? No    Verified patient's date of birth / HIPAA.    Specialty medication(s) to be sent: Inflammatory Disorders: Cosentyx      Non-specialty medications/supplies to be sent: nicotine patches, sharps      Medications not needed at this time: n/a         Cosentyx (secukinumab)    Medication & Administration     Dosage: Hidradenitis Suppurativa: Inject 300mg  under the skin at weeks 0, 1, 2, 3, and 4 followed by 300 mg every 4 weeks; consider an increase to 300 mg every 2 weeks in patients who have an inadequate response      Lab tests required prior to treatment initiation:  Tuberculosis: Tuberculosis screening resulted in a non-reactive Quantiferon TB Gold assay. (Completed 04/26/22)      Administration:       Prefilled Unoready?? auto-injector pen  Gather all supplies needed for injection on a clean, flat working surface: medication pen removed from packaging, alcohol swab, sharps container, etc.  Look at the medication label - look for correct medication, correct dose, and check the expiration date  Look at the medication - the liquid visible in the window on the side of the pen device should appear clear and colorless to slightly yellow  Lay the auto-injector pen on a flat surface and allow it to warm up to room temperature for at least  30-45 minutes  Select injection site - you can use the front of your thigh or your belly (but not the area 2 inches around your belly button); if someone else is giving you the injection you can also use your upper arm in the skin covering your triceps muscle  Prepare injection site - wash your hands and clean the skin at the injection site with an alcohol swab and let it air dry, do not touch the injection site again before the injection  Pull off the purple safety cap in the direction of the arrow, do not remove until immediately prior to injection and do not touch the red needle cover  Put the red needle cover against your skin at the injection site at a 90 degree angle, hold the pen such that you can see the clear medication window  Press down and hold the pen firmly against your skin, there will be a click when the injection starts  Continue to hold the pen firmly against your skin for about 10-15 seconds - the window will start to turn solid green  There will be a second click sound when the injection is almost complete, verify the window is solid green to indicate the injection is complete and then pull the pen away from your skin  Dispose of the used auto-injector pen immediately in your sharps disposal container the needle will be covered automatically  If you see any blood at the injection site, press a cotton ball or gauze on the site and maintain pressure until the bleeding stops, do not rub the injection site      Adherence/Missed dose instructions:  If your injection is given more  than 4 days after your scheduled injection date - consult your pharmacist for additional instructions on how to adjust your dosing schedule.        Goals of Therapy       Hidradenitis Suppurativa  Reduce the frequency and severity of new lesions  Minimize pain and suppuration  Prevent disease progression and limit scarring  Maintenance of effective psychosocial functioning      Side Effects & Monitoring Parameters     Injection site reaction (redness, irritation, inflammation localized to the site of administration)  Signs of a common cold - minor sore throat, runny or stuffy nose, etc.  Diarrhea    The following side effects should be reported to the provider:  Signs of a hypersensitivity reaction - rash; hives; itching; red, swollen, blistered, or peeling skin; wheezing; tightness in the chest or throat; difficulty breathing, swallowing, or talking; swelling of the mouth, face, lips, tongue, or throat; etc.  Reduced immune function - report signs of infection such as fever; chills; body aches; very bad sore throat; ear or sinus pain; cough; more sputum or change in color of sputum; pain with passing urine; wound that will not heal, etc.  Also at a slightly higher risk of some malignancies (mainly skin and blood cancers) due to this reduced immune function.  In the case of signs of infection - the patient should hold the next dose of Cosentyx?? and call your primary care provider to ensure adequate medical care.  Treatment may be resumed when infection is treated and patient is asymptomatic.  Muscle pain or weakness  Shortness of breath      Warnings, Precautions, & Contraindications     Have your bloodwork checked as you have been told by your prescriber  Talk with your doctor if you are pregnant, planning to become pregnant, or breastfeeding  Discuss the possible need for holding your dose(s) of Cosentyx?? when a planned procedure is scheduled with the prescriber as it may delay healing/recovery timeline       Drug/Food Interactions     Medication list reviewed in Epic. The patient was instructed to inform the care team before taking any new medications or supplements. No drug interactions identified.   If you have a latex allergy use caution when handling, the needle cap of the Cosentyx?? prefilled syringe and the safety cap for the Cosentyx Sensoready?? pen contains a derivative of natural rubber latex. Unoready?? pen does NOT contain latex.   Talk with you prescriber or pharmacist before receiving any live vaccinations while taking this medication and after you stop taking it      Storage, Handling Precautions, & Disposal     Store this medication in the refrigerator.  Do not freeze  May store intact Sensoready pens and 150 mg/mL prefilled syringes at ?30??C (?86??F) for up to 4 days; may return to the refrigerator if unused  Store in original packaging, protected from light  Do not shake  Dispose of used syringes/pens in a sharps disposal container           Current Medications (including OTC/herbals), Comorbidities and Allergies     Current Outpatient Medications   Medication Sig Dispense Refill    adalimumab (HUMIRA PEN CROHNS-UC-HS START) 40 mg/0.8 mL injection PEN Inject the contents of 1 pen (40 mg total) under the skin once a week. Maintenance dose 4 each 5    alcohol swabs (ALCOHOL PREP PADS) PadM Use three times a day as needed. 100 each 0  amlodipine (NORVASC) 10 MG tablet Take 1 tablet (10 mg total) by mouth daily. 90 tablet 3    atorvastatin (LIPITOR) 80 MG tablet Take 1 tablet (80 mg total) by mouth nightly. 90 tablet 2    blood sugar diagnostic (ACCU-CHEK GUIDE TEST STRIPS) Strp Use to check blood glucose once daily. 100 strip 1    blood-glucose meter kit Test blood glucose level once daily. 1 each 0    carvedilol (COREG) 6.25 MG tablet Take 1 tablet (6.25 mg total) by mouth two (2) times a day. 180 tablet 3    cetirizine (ZYRTEC) 10 MG tablet Take 1 tablet (10 mg total) by mouth daily. 30 tablet 2    clindamycin (CLEOCIN T) 1 % lotion Apply topically two (2) times a day. (Patient not taking: Reported on 10/04/2022) 60 mL 11    empty container Misc Use as directed to dispose of Humira pens. 1 each 2    gabapentin (NEURONTIN) 300 MG capsule Take 1 capsule (300 mg total) by mouth two (2) times a day. 180 capsule 2    HUMIRA PEN CITRATE FREE STARTER PACK FOR CROHN'S/UC/HS 3 X 80 MG/0.8 ML Inject the contents of 2 pens (160 mg) on day 1, THEN 1 pen (80 mg) on day 15. 3 each 0    hydrOXYzine (ATARAX) 25 MG tablet Take 1 tablet (25 mg total) by mouth every eight (8) hours as needed. 30 tablet 0    ibuprofen (MOTRIN) 600 MG tablet Take 1 tablet (600 mg total) by mouth Three (3) times a day as needed for pain. 30 tablet 1 insulin glargine (BASAGLAR, LANTUS) 100 unit/mL (3 mL) injection pen Inject 0.3 mL (30 Units total) under the skin nightly. 15 mL 2    lancets Misc Use to check blood glucose once daily. 100 each 1    lenvatinib (LENVIMA) 18 mg/day (10 mg x 1 AND 4 mg x 2) cap Take one (10mg ) capsule plus two (4mg ) capsules by mouth daily for a total of 18mg  daily. 90 capsule 2    levothyroxine (SYNTHROID) 300 MCG tablet Take 1 tablet (300 mcg total) by mouth daily. 90 tablet 3    losartan-hydroCHLOROthiazide (HYZAAR) 100-25 mg per tablet Take 1 tablet by mouth daily. 90 tablet 3    metFORMIN (GLUCOPHAGE) 500 MG tablet Take 2 tablets (1,000 mg total) by mouth in the morning and 2 tablets (1,000 mg total) in the evening. Take with meals. 360 tablet 2    nicotine (NICODERM CQ) 21 mg/24 hr patch Place 1 patch on the skin and change daily. 28 patch 4    pen needle, diabetic (TRUEPLUS PEN NEEDLE) 32 gauge x 5/32 (4 mm) Ndle Use with insulin glargine once daily 100 each 1    secukinumab (COSENTYX UNOREADY PEN) 300 mg/2 mL (150 mg/mL) PnIj Inject the contents of 1 pen (300mg ) under the skin weekly for 5 weeks as loading doses. (on weeks 0,1,2,3,4 as loading doses) 10 mL 0    secukinumab (COSENTYX UNOREADY PEN) 300 mg/2 mL (150 mg/mL) PnIj Inject the contents of 1 pen (300mg ) under the skin every 28 days as maintenance 2 mL 11    spironolactone (ALDACTONE) 25 MG tablet Take 1 tablet (25 mg total) by mouth daily. 90 tablet 3     No current facility-administered medications for this visit.       Allergies   Allergen Reactions    Penicillin Anaphylaxis    Fish Derived     Lisinopril     Peanut  Butter Flavor      Per allergy testing    Penicillins Other (See Comments)     Per patient, when he took at age 65 his legs and arms shook. Not sure if it was seizure or not.     Shellfish Containing Products     Soy        Patient Active Problem List   Diagnosis    Abnormal computed tomography angiography (CTA)    Thyroid cancer (CMS-HCC) Obesity    Right inguinal hernia    Diabetes (CMS-HCC)    Essential hypertension    Hidradenitis suppurativa    Postoperative hypothyroidism       Reviewed and up to date in Epic.    Appropriateness of Therapy     Acute infections noted within Epic:  No active infections  Patient reported infection: None    Is the medication and dose appropriate based on diagnosis, medication list, comorbidities, allergies, medical history, patient???s ability to self-administer the medication, and therapeutic goals? Yes    Prescription has been clinically reviewed: Yes      Baseline Quality of Life Assessment      How many days over the past month did your HS  keep you from your normal activities? For example, brushing your teeth or getting up in the morning. 0    Financial Information     Medication Assistance provided: Prior Authorization    Anticipated copay of $4/LD, $4/MD reviewed with patient. Verified delivery address.    Delivery Information     Scheduled delivery date: 10/09/22    Expected start date: 10/09/22      Medication will be delivered via Same Day Courier to the prescription address in [facility].  This shipment will not require a signature.      Explained the services we provide at [facility] and that each month we would call to set up refills.  Stressed importance of returning phone calls so that we could ensure they receive their medications in time each month.  Informed patient that we should be setting up refills 7-10 days prior to when they will run out of medication.  A pharmacist will reach out to perform a clinical assessment periodically.  Informed patient that a welcome packet, containing information about our pharmacy and other support services, a Notice of Privacy Practices, and a drug information handout will be sent.      The patient or caregiver noted above participated in the development of this care plan and knows that they can request review of or adjustments to the care plan at any time.      Patient or caregiver verbalized understanding of the above information as well as how to contact the pharmacy at [phone] option 4 with any questions/concerns.  The pharmacy is open Monday through Friday 8:30am-4:30pm.  A pharmacist is available 24/7 via pager to answer any clinical questions they may have.    Patient Specific Needs     Does the patient have any physical, cognitive, or cultural barriers? No    Does the patient have adequate living arrangements? (i.e. the ability to store and take their medication appropriately) Yes    Did you identify any home environmental safety or security hazards? No    Patient prefers to have medications discussed with  Patient     Is the patient or caregiver able to read and understand education materials at a high school level or above? Yes    Patient's primary language  is  English     Is the patient high risk? No    SOCIAL DETERMINANTS OF HEALTH     At the [facility], we have learned that life circumstances - like trouble affording food, housing, utilities, or transportation can affect the health of many of our patients.   That is why we wanted to ask: are you currently experiencing any life circumstances that are negatively impacting your health and/or quality of life? Patient declined to answer    Social Determinants of Health     Financial Resource Strain: Low Risk  (07/29/2020)    Overall Financial Resource Strain (CARDIA)     Difficulty of Paying Living Expenses: Not hard at all   Internet Connectivity: Not on file   Food Insecurity: No Food Insecurity (07/29/2020)    Hunger Vital Sign     Worried About Running Out of Food in the Last Year: Never true     Ran Out of Food in the Last Year: Never true   Tobacco Use: High Risk (10/03/2022)    Patient History     Smoking Tobacco Use: Some Days     Smokeless Tobacco Use: Never     Passive Exposure: Current   Housing/Utilities: Low Risk  (07/29/2020)    Housing/Utilities     Within the past 12 months, have you ever stayed: outside, in a car, in a tent, in an overnight shelter, or temporarily in someone else's home (i.e. couch-surfing)?: No     Are you worried about losing your housing?: No     Within the past 12 months, have you been unable to get utilities (heat, electricity) when it was really needed?: No   Alcohol Use: Alcohol Misuse (08/22/2021)    Received from [facility], [facility]    AUDIT-C     Frequency of Alcohol Consumption: 2-3 times a week     Average Number of Drinks: 5 or 6     Frequency of Binge Drinking: Weekly   Transportation Needs: Unmet Transportation Needs (05/26/2022)    PRAPARE - Transportation     Lack of Transportation (Medical): Yes     Lack of Transportation (Non-Medical): Not on file   Substance Use: Not on file   Health Literacy: Not on file   Physical Activity: Not on file   Interpersonal Safety: Unknown (10/05/2022)    Interpersonal Safety     Unsafe Where You Currently Live: Not on file     Physically Hurt by Anyone: Not on file     Abused by Anyone: Not on file   Stress: Not on file   Intimate Partner Violence: Not on file   Depression: Not at risk (04/19/2022)    PHQ-2     PHQ-2 Score: 0   Social Connections: Not on file       Would you be willing to receive help with any of the needs that you have identified today? Not applicable       Darryl Nestle, PharmD  [facility] Specialty Pharmacist

## 2022-10-06 MED ORDER — NICOTINE (POLACRILEX) 4 MG GUM
BUCCAL | 2 refills | 5 days | Status: CP | PRN
Start: 2022-10-06 — End: ?
  Filled 2022-11-21: qty 110, 5d supply, fill #0

## 2022-10-06 MED ORDER — NICOTINE 21 MG/24 HR DAILY TRANSDERMAL PATCH
MEDICATED_PATCH | TRANSDERMAL | 2 refills | 28 days | Status: CP
Start: 2022-10-06 — End: ?

## 2022-10-06 NOTE — Unmapped (Signed)
[facility]  PCP: Andree Coss, MD    Consulting providers:  Otolaryngology: Dr. 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 man with history of poorly controlled T2DM, hypertension, ischemic cardiac disease s/p catheterization in 2021 and metastatic thyroid cancer, s/p resection with Dr. Azucena Fallen on 04/12/20 and neck dissection on 07/28/20. Noted to have metastatic disease with pulmonary involvement on PET/CT (01/14/2022). Treating with 1L Lenvima 18 mg daily (02/21/21-current). Course has been complicated by poor compliance. He presents today in follow up on treatment to review scans.    # Metastatic Papillary Thyroid Cancer, BRAF WT  - Treatment goals are palliative.   - Tg: 75 (02/17/2020) --> 43 (06/24/2020) --> 212 (01/04/2021) --> 323 (02/01/21) ---> 502 (06/15/21) --> 662 (10/05/2021) --> 70 (02/01/2022) --> 107 (03/15/2022)  - Currently on Lenvima (02/21/21- current); notable for multiple missed doses  - CT Neck/Chest (03/15/2022): stable disease  - Continue Lenvima 18 mg daily  - Labs reviewed and acceptable  - reports some increased diff with swallowing at times, feels as if food sticks occ.    - will refer for swallowing study and ST eval.      # Hidradenitis suppurativa  - Poorly controlled, seen in ED 10/9 and prescribed clindamycin gel but not covered by insurance  - Followed by dermatology (Dr. Loura Pardon)    # Hypertension, poor control  - 186/120, remains above goal today.  Has not taken his medications today.  States he usually takes at 4pm and has nocturia.  Discussed taking in the AM upon waking.    - Current regimen includes amlodipine 10 mg daily, Lorsartan 100 mg, HCTZ 25 mg daily and Carvedilol  - Encouraged medical compliance and frequent home checks  - Has been referred to PCP, has appt 10/19/22    # Diabetes, ongoing hyperglycemia  - Poor control but improving, A1c 14.6% (11/17/21) down to 9.8% (02/01/22)  - Managing with metformin 1000 mg twice daily and Basaglar 20 units nightly  - We also discussed importance of compliance on insulin as well as frequent blood sugar checks  - PCP referral as above    # Hypothyroidism,  -  TSH 0.023, FT4 1.97 (9/6//2024)  - Poor control due to lack of medication compliance  - Goal TSH <0.1  -  Continue synthroid 200 mcg once daily.  Refilled today    # Supportive care  - Cancer-related pain: Continue on Gabapentin 300mg  + Tramadol 50 mg q6hrs prn (script given today)  - Psychosocial: denies any need for CCSP at this time.   - Constipation: Recommended Miralax     Follow up: 4 weeks with CPP    Vernona Rieger C. [place]da [place]  Division of Oncology  [place] [facility] [place] [facility]  --------------------------  Interval History  - fatigue remains  - some increase in diff swallowing  - has not been checking BP or BS at home, states he will try to obtaib BP monitor  - denies HA, dizziness, fevers, chills, n/v, diarrhea, constipation  - reports compliance with Lenvima  - presents alone    History of Present Illness:  JAYREN HEWETT is a 52 y.o. male with history of T2DM, hypertension, ischemic heart disease s/p cardiac catheterization (07/2019) who presents for evaluation of head and neck cancer. I have reviewed his records including history, imaging, pathology reports, and,  when applicable, operative notes and summarized his oncologic history below:     Patient was incidentally diagnosed on workup for chest pain in 2021. CTA cardiac (07/08/2019) showed bilateral pulmonary nodules and repeat imaging from 10/2019 and 12/2019 redemonstrated nodules. PET/CT obtained (10/2019) and was notable for hypermetabolic right hilar and mediastinal lymph nodes. FNA performed on 01/09/2020 to dominant right upper pulmonary nodule and confirmed papillary thyroid carcinoma. Further biopsy of lymph nodes were negative for malignancy.      He was evaluated by Dr. Azucena Fallen in February 2022. At that time he reports intermittent dysphagia, but was not significantly bothersome and did not limit his diet. He otherwise denied palpable neck mass, voice changes, otalgia or weight loss. Pre-treatment Tg was 75 in January 2022. Patient underwent total thyroidectomy on 04/12/2020 with bilateral neck dissection on 07/28/2020. Final pathology consistent with PTC, lymph node involvement (14/14) with positive ENE. Received adjuvant RAI (200 mCi I-131) on 05/20/2020.      PET/CT (01/14/2021) concerning for numerous pulmonary nodules that had increased in size when compared to prior imaging. Otherwise, no evidence of distant disease. Tumor marker was also noted to be elevated at 212 (01/04/2021).      02/01/2021: Present unaccompanied today, feeling well until recent illness as described above. He does report residual numbness all along the underside of his face. Previously smoked 2 packs of cigarettes per day for 14 years. Currently smoking ~3 cigarettes daily. He is not currently employed, but would like to work if possible. He was recently been released from prison and is currently staying with [facility].     Past Medical History:   Diagnosis Date    Abnormal findings on dx imaging of heart and cor circ     Acne vulgaris     Allergic contact dermatitis due to food in contact with skin     Atherosclerosis     Athscl heart disease of native coronary artery w/o ang pctrs     Chronic ischemic heart disease     Cutaneous abscess     Diabetic polyneuropathy (CMS-HCC)     Disorder of skin and subcutaneous tissue     Essential hypertension     H/O medication noncompliance     Hernia, inguinal, unilateral     Hypertrophic cardiomyopathy (CMS-HCC)     Malignant neoplasm of thyroid gland (CMS-HCC)     Presbyopia     Pseudofolliculitis barbae     Shoulder pain, bilateral     Tinea unguium     Type 2 diabetes mellitus (CMS-HCC)     Xerosis cutis        Past Surgical History:   Procedure Laterality Date PR BRNCHSC EBUS GUIDED SAMPL 1/2 NODE STATION/STRUX N/A 01/09/2020    Procedure: BRONCH, RIGID OR FLEXIBLE, INC FLUORO GUIDANCE, WHEN PERFORMED; WITH EBUS GUIDED TRANSTRACHEAL AND/OR TRANSBRONCHIAL SAMPLING, ONE OR TWO MEDIASTINAL AND/OR HILAR LYMPH NODE STATIONS OR STRUCTURES;  Surgeon: Jerelyn Charles, MD;  Location: MAIN OR [place];  Service: Pulmonary    PR BRNSCHSC TNDSC EBUS DX/TX INTERVENTION PERPH LES Right 01/09/2020    Procedure: BRONCH, RIGID OR FLEXIBLE, INCLUDING FLUORO GUIDANCE, WHEN PERFORMED; WITH TRANSENDOSCOPIC EBUS DURING BRONCHOSCOPIC DIAGNOSTIC OR THERAPEUTIC INTERVENTION(S) FOR PERIPHERAL LESION(S);  Surgeon: Jerelyn Charles, MD;  Location: MAIN OR [place];  Service: Pulmonary    PR BRONCHOSCOPY,COMPUTER ASSIST/IMAGE-GUIDED NAVIGATION Right 01/09/2020    Procedure: ROBOT ION BRONCHOSCOPY,RIGID OR FLEXIBLE,INCLUDE FLUORO WHEN PERFORMED; W/COMPUTER-ASSIST,IMAGE-GUIDED NAVIGATION;  Surgeon: Jerelyn Charles, MD;  Location: MAIN OR [place];  Service:  Pulmonary    PR BRONCHOSCOPY,DIAGNOSTIC W LAVAGE Right 01/09/2020    Procedure: BRONCHOSCOPY, RIGID OR FLEXIBLE, INCLUDE FLUOROSCOPIC GUIDANCE WHEN PERFORMED; W/BRONCHIAL ALVEOLAR LAVAGE;  Surgeon: Jerelyn Charles, MD;  Location: MAIN OR [place];  Service: Pulmonary    PR BRONCHOSCOPY,TRANSBRON ASPIR BX Right 01/09/2020    Procedure: BRONCHOSCOPY, RIGID/FLEX, INCL FLUORO; W/TRANSBRONCH NDL ASPIRAT BX, TRACHEA, MAIN STEM &/OR LOBAR BRONCHUS;  Surgeon: Jerelyn Charles, MD;  Location: MAIN OR [place];  Service: Pulmonary    PR BRONCHOSCOPY,TRANSBRONCH BIOPSY Right 01/09/2020    Procedure: BRONCHOSCOPY, RIGID/FLEXIBLE, INCLUDE FLUORO GUIDANCE WHEN PERFORMED; W/TRANSBRONCHIAL LUNG BX, SINGLE LOBE;  Surgeon: Jerelyn Charles, MD;  Location: MAIN OR [facility];  Service: Pulmonary    PR CATH PLACE/CORON ANGIO, IMG SUPER/INTERP,W LEFT HEART VENTRICULOGRAPHY N/A 08/26/2019    Procedure: Left Heart Catheterization;  Surgeon: Marlaine Hind, MD;  Location: [facility] CATH;  Service: Cardiology    PR LIGATN THOR DUCT,CERV APPROACH Bilateral 07/28/2020    Procedure: SUTURE &/OR LIG THORACIC DUCT; CERV APPROACH;  Surgeon: Lauralee Evener, MD;  Location: MAIN OR [facility];  Service: ENT    PR REMOVAL NODES, NECK,CERV MOD RAD Bilateral 07/28/2020    Procedure: CERVICAL LYMPHADENECTOMY (MODIFIED RADICAL NECK DISSECTION);  Surgeon: Lauralee Evener, MD;  Location: MAIN OR [facility];  Service: ENT    PR THYROIDECTOMY,MALIG,LTD NECK SURG Bilateral 04/12/2020    Procedure: THYROIDECTOMY, TOTAL OR SUBTOTAL FOR MALIGNANCY; WITH LIMITED NECK DISSECTION;  Surgeon: Lauralee Evener, MD;  Location: MAIN OR [facility];  Service: ENT       Current Outpatient Medications   Medication Sig Dispense Refill    adalimumab (HUMIRA PEN CROHNS-UC-HS START) 40 mg/0.8 mL injection PEN Inject the contents of 1 pen (40 mg total) under the skin once a week. Maintenance dose 4 each 5    alcohol swabs (ALCOHOL PREP PADS) PadM Use three times a day as needed. 100 each 0    atorvastatin (LIPITOR) 80 MG tablet Take 1 tablet (80 mg total) by mouth nightly. 90 tablet 2    blood sugar diagnostic (ACCU-CHEK GUIDE TEST STRIPS) Strp Use to check blood glucose once daily. 100 strip 1    blood-glucose meter kit Test blood glucose level once daily. 1 each 0    carvedilol (COREG) 6.25 MG tablet Take 1 tablet (6.25 mg total) by mouth two (2) times a day. 180 tablet 3    empty container Misc Use as directed to dispose of Humira pens. 1 each 2    HUMIRA PEN CITRATE FREE STARTER PACK FOR CROHN'S/UC/HS 3 X 80 MG/0.8 ML Inject the contents of 2 pens (160 mg) on day 1, THEN 1 pen (80 mg) on day 15. 3 each 0    insulin glargine (BASAGLAR, LANTUS) 100 unit/mL (3 mL) injection pen Inject 0.3 mL (30 Units total) under the skin nightly. 15 mL 2    lancets Misc Use to check blood glucose once daily. 100 each 1    lenvatinib (LENVIMA) 18 mg/day (10 mg x 1 AND 4 mg x 2) cap Take one (10mg ) capsule plus two (4mg ) capsules by mouth daily for a total of 18mg  daily. 90 capsule 2    metFORMIN (GLUCOPHAGE) 500 MG tablet Take 2 tablets (1,000 mg total) by mouth in the morning and 2 tablets (1,000 mg total) in the evening. Take with meals. 360 tablet 2    nicotine (NICODERM CQ) 21 mg/24 hr patch Place 1 patch on the skin and change daily. 28 patch 4    pen  needle, diabetic (TRUEPLUS PEN NEEDLE) 32 gauge x 5/32 (4 mm) Ndle Use with insulin glargine once daily 100 each 1    amlodipine (NORVASC) 10 MG tablet Take 1 tablet (10 mg total) by mouth daily. 90 tablet 3    cetirizine (ZYRTEC) 10 MG tablet Take 1 tablet (10 mg total) by mouth daily. 30 tablet 2    clindamycin (CLEOCIN T) 1 % lotion Apply topically two (2) times a day. (Patient not taking: Reported on 10/04/2022) 60 mL 11    gabapentin (NEURONTIN) 300 MG capsule Take 1 capsule (300 mg total) by mouth two (2) times a day. 180 capsule 2    hydrOXYzine (ATARAX) 25 MG tablet Take 1 tablet (25 mg total) by mouth every eight (8) hours as needed. 30 tablet 0    ibuprofen (MOTRIN) 600 MG tablet Take 1 tablet (600 mg total) by mouth Three (3) times a day as needed for pain. 30 tablet 1    levothyroxine (SYNTHROID) 300 MCG tablet Take 1 tablet (300 mcg total) by mouth daily. 90 tablet 3    losartan-hydroCHLOROthiazide (HYZAAR) 100-25 mg per tablet Take 1 tablet by mouth daily. 90 tablet 3    nicotine (NICODERM CQ) 21 mg/24 hr patch Place 1 patch on the skin daily. Remove old patch before applying new one. 28 patch 2    nicotine polacrilex (NICORETTE) 4 MG gum Chew 1 piece (4 mg total) and park in cheek every hour as needed for smoking cessation. Max is up to 24 pieces/day. 110 each 2    secukinumab (COSENTYX UNOREADY PEN) 300 mg/2 mL (150 mg/mL) PnIj Inject the contents of 1 pen (300mg ) under the skin weekly for 5 weeks as loading doses. (on weeks 0,1,2,3,4 as loading doses) 10 mL 0    secukinumab (COSENTYX UNOREADY PEN) 300 mg/2 mL (150 mg/mL) PnIj Inject the contents of 1 pen (300mg ) under the skin every 28 days as maintenance 2 mL 11    spironolactone (ALDACTONE) 25 MG tablet Take 1 tablet (25 mg total) by mouth daily. 90 tablet 3     No current facility-administered medications for this visit.       Allergies   Allergen Reactions    Penicillin Anaphylaxis    Fish Derived     Lisinopril     Peanut Butter Flavor      Per allergy testing    Penicillins Other (See Comments)     Per patient, when he took at age 71 his legs and arms shook. Not sure if it was seizure or not.     Shellfish Containing Products     Soy        Social History     Tobacco Use    Smoking status: Some Days     Types: Cigarettes     Passive exposure: Current    Smokeless tobacco: Never   Vaping Use    Vaping status: Never Used   Substance Use Topics    Alcohol use: Yes    Drug use: Not Currently     Types: Marijuana     Comment: quit in 1992       Social History     Social History Narrative    Not on file       Family History   Problem Relation Age of Onset    Melanoma Neg Hx     Basal cell carcinoma Neg Hx     Squamous cell carcinoma Neg Hx  Review of Systems: A 12-system review of systems was obtained including: Constitutional, Eyes, ENT, Cardiovascular, Respiratory, GI, GU, Musculoskeletal, Skin, Neurological, Psychiatric, Endocrine, Heme/Lymphatic, and Allergic/Immunologic systems. It is negative or non-contributory to the patient???s management except for as stated in patient's HPI    ECOG PS: 0    Physical Examination:  Vital Signs: BP (S) 186/120 Comment: Provider notified. Patient did not take BP medication today. - Pulse 77  - Temp 36.9 ??C (98.5 ??F) (Temporal)  - Resp 20  - Ht 171.5 cm (5' 7.5)  - Wt 76.9 kg (169 lb 8 oz)  - SpO2 98%  - BMI 26.16 kg/m??   CONSTITUTIONAL: Well appearing man in NAD  Oral Cavity: MMM, no oral lesions or mucositis  Lymphatics: No lymphadenopathy   CV: RRR; no lower extremity edema  RESP: normal work of breathing, clear bilaterally   GI: Soft, non-tender, non-distended  SKIN: No skin rashes; groin not examined today  NEURO: no focal deficits appreciated, speech clear, gait steady, alert and oriented  PSYCH: Normal mood and appropriate affect    LABS  Lab Results   Component Value Date    WBC 7.0 10/04/2022    HGB 12.9 10/04/2022    HCT 38.9 (L) 10/04/2022    PLT 243 10/04/2022       Lab Results   Component Value Date    NA 138 10/04/2022    K 4.6 10/04/2022    CL 106 10/04/2022    CO2 26.0 10/04/2022    BUN 16 10/04/2022    CREATININE 0.85 10/04/2022    GLU 372 (H) 10/04/2022    CALCIUM 9.2 10/04/2022    MG 1.6 07/31/2020    PHOS 3.0 07/31/2020       Lab Results   Component Value Date    BILITOT 0.2 (L) 10/04/2022    PROT 7.0 10/04/2022    ALBUMIN 2.9 (L) 10/04/2022    ALT 12 10/04/2022    AST 22 10/04/2022    ALKPHOS 92 10/04/2022       No results found for: PT, INR, APTT      IMAGING  CT Neck (03/15/2022)  -- No definitive recurrence within the thyroid resection bed or right jugular chain resection bed. No adenopathy.  -- Multiple bilateral upper lobe lung nodules. Please refer to dedicated chest CT for further evaluation.  -- Mucosal thickening of the inferior right maxillary sinus with bony dehiscence and periapical lucency of the second molar possibly suggesting odontogenic sinusitis.    CT Chest (03/15/2022)  -- Stable bilateral pulmonary metastases and sclerotic lesions within the right posterior eighth and 12th ribs. No new sites of disease within the chest.  -- Minimal coronary calcifications in LAD. Recommend primary care physician evaluation for cardiovascular risk assessment.    PATHOLOGY  THYROID GLAND  8th Edition - Protocol posted: 03/27/2018   THYROID GLAND: RESECTION - All Specimens  Clinical History  No known radiation exposure    SPECIMEN   Procedure  Total thyroidectomy    TUMOR   Tumor Focality  Multifocal    Tumor Characteristics     Tumor Site  Right lobe      Left lobe      Isthmus    Histologic Type  Papillary carcinoma, classic (usual, conventional)    Tumor Size  Greatest Dimension (Centimeters): 3.1 cm   Extrathyroidal Extension  Not identified    Angioinvasion (vascular invasion)  Not identified    Lymphatic Invasion  Not identified    Perineural  Invasion  Not identified    Margins  Uninvolved by carcinoma    Distance of Invasive Carcinoma from Closest Margin (Millimeters)  1 mm   Mitotic Rate  4 Mitoses per 2 mm^2   LYMPH NODES   Number of Lymph Nodes Involved  14    Nodal Levels Involved  Level VI    Size of Largest Metastatic Deposit (Centimeters)  5 cm   Extranodal Extension (ENE)  Present    Number of Lymph Nodes Examined  14    Nodal Levels Examined  Level VI    PATHOLOGIC STAGE CLASSIFICATION (pTNM, AJCC 8th Edition)   TNM Descriptors  m (multiple primary tumors)         Primary Tumor (pT)  pT2    Regional Lymph Nodes (pN)  pN1a    Distant Metastasis (pM)  pM1    Site(s)  lung see [id number]

## 2022-10-09 MED FILL — EMPTY CONTAINER: 120 days supply | Qty: 1 | Fill #1

## 2022-10-09 NOTE — Unmapped (Signed)
Patient [facility] Cancer Support Encounter    Mr. Nicklin called the Patient & [facility] with concern that he never received his injection needles. Conference called the [facility] to check tracking. Was delivered on 8/16. Mr. Cejas to check with roommates to see if package misplaced and if unable to find, will call again to see if able to get insurance approval for additional refill.     [name], [name], RN, [facility]   She/Her/Hers  Oncology Nurse Navigator - [name] Patient [facility]  [facility]  [address] [place], [place] [address]    Center: [phone]  Voicemail: [phone]  [email or web address]

## 2022-10-10 MED ORDER — ALCOHOL SWABS
0 refills | 0 days | Status: CP
Start: 2022-10-10 — End: ?
  Filled 2022-10-24: qty 100, 33d supply, fill #0

## 2022-10-10 NOTE — Unmapped (Signed)
Patient [facility] Cancer Support Encounter    Met with patient in the Patient & [facility]. Provided active listening and emotional support as patient discussed current needs and challenges.    Help set-up [employer] phone. Downloaded MyChart and Mr. Severson demonstrated he was able to navigate application. Phone set-up completed. Updated demographic information with new phone number.    Called pharmacy with update that Mr. Sellards was unable to find needles. Pharmacist, Tammy Sours, placed request for insurance override.     Mr. Flint reported that he received a phone call this morning with a new dermatology appointment. Has appointment scheduled in December, but Mr. Bensing thought appointment was next week. Called dermatology scheduling to confirm no appointments next week.     [name], [name], RN, [facility]   She/Her/Hers  Oncology Nurse Navigator - [name] Patient [facility]  [facility]  [address], [place], [place] [address]    Center: [phone]  Voicemail: [phone]  [email or web address]

## 2022-10-12 NOTE — Unmapped (Signed)
Patient [facility] Cancer Support Encounter    Mr. Roosevelt called the Patient [facility] requesting information about upcoming ride. His phone number has been updated and old phone number will be shut off on 9/15. Called [facility] to update phone number for arranged rides.     [name], [name], RN, [facility]   She/Her/Hers  Oncology Nurse Navigator - [name] Patient [facility]  [facility]  [address] [place], [place] [address]    Center: [phone]  Voicemail: [phone]  [email or web address]

## 2022-10-13 NOTE — Unmapped (Signed)
Patient [facility] Cancer Support Encounter    Mr. Moeller called the Patient & [facility] and left voicemail. Called Mr. Vana back. Provided active listening and emotional support as patient discussed current needs and challenges.    Had concerns about appointment with SLP and barium swallow at the same time. Explained that the double-booking is intentional for that appointment. Mr. Denecke verbalized understanding. Reviewed rides for upcoming appointment.     [name], [name], RN, [facility]   She/Her/Hers  Oncology Nurse Navigator - [name] Patient [facility]  [facility]  [address], [place], [place] [address]    Center: [phone]  Voicemail: [phone]  [email or web address]

## 2022-10-18 NOTE — Unmapped (Signed)
Patient [facility] Cancer Support Encounter    Mr. Broas called the Patient & [facility]. Provided active listening and emotional support as patient discussed current needs and challenges. Had attempted to connect with the pharmacy to arrange delivery of upcoming medications, but was unable to connect to a live agent and order necessary medications. Conference called with pharmacy and arranged deliveries.     [name], [name], RN, [facility]   She/Her/Hers  Oncology Nurse Navigator - [name] Patient [facility]  [facility]  [address], [place], [place] [address]    Center: [phone]  Voicemail: [phone]  [email or web address]

## 2022-10-18 NOTE — Unmapped (Signed)
[facility] Home Delivery Pharmacy Refill Coordination Note    Specialty Medication(s) to be Shipped:   Hematology/Oncology: Paul Hines    Other medication(s) to be shipped: No additional medications requested for fill at this time     Paul Hines, DOB: [birth year]  Phone: [phone] (home)       All above HIPAA information was verified with patient.     Was a Nurse, learning disability used for this call? No    Completed refill call assessment today to schedule patient's medication shipment from the [facility] and [facility] Delivery Pharmacy  [phone]).  All relevant notes have been reviewed.     Specialty medication(s) and dose(s) confirmed: Regimen is correct and unchanged.   Changes to medications: Paul Hines reports no changes at this time.  Changes to insurance: No  New side effects reported not previously addressed with a pharmacist or physician: None reported  Questions for the pharmacist: No    Confirmed patient received a Conservation officer, historic buildings and a Surveyor, mining with first shipment. The patient will receive a drug information handout for each medication shipped and additional FDA Medication Guides as required.       DISEASE/MEDICATION-SPECIFIC INFORMATION        N/A    SPECIALTY MEDICATION ADHERENCE     Medication Adherence    Patient reported X missed doses in the last month: 0  Specialty Medication: lenvima  Patient is on additional specialty medications: No  Patient is on more than two specialty medications: No  Any gaps in refill history greater than 2 weeks in the last 3 months: no  Demonstrates understanding of importance of adherence: yes  Informant: patient  Reliability of informant: reliable  Provider-estimated medication adherence level: good  Patient is at risk for Non-Adherence: No  Reasons for non-adherence: no problems identified  Confirmed plan for next specialty medication refill: delivery by pharmacy  Refills needed for supportive medications: not needed          Refill Coordination Has the Patients' Contact Information Changed: No  Is the Shipping Address Different: No         Were doses missed due to medication being on hold? No    lenvima 18mg : 8 days of medicine on hand       REFERRAL TO PHARMACIST     Referral to the pharmacist: Not needed      [facility]     Shipping address confirmed in Epic.       Delivery Scheduled: Yes, Expected medication delivery date: 09/25.     Medication will be delivered via Next Day Courier to the prescription address in Epic WAM.    Paul Hines   [facility] Pharm[facility]  Specialty Technician

## 2022-10-19 ENCOUNTER — Ambulatory Visit: Admit: 2022-10-19 | Discharge: 2022-10-20 | Payer: PRIVATE HEALTH INSURANCE

## 2022-10-19 DIAGNOSIS — E119 Type 2 diabetes mellitus without complications: Principal | ICD-10-CM

## 2022-10-19 DIAGNOSIS — Z23 Encounter for immunization: Principal | ICD-10-CM

## 2022-10-19 DIAGNOSIS — Z794 Long term (current) use of insulin: Principal | ICD-10-CM

## 2022-10-19 DIAGNOSIS — I10 Essential (primary) hypertension: Principal | ICD-10-CM

## 2022-10-19 DIAGNOSIS — C73 Malignant neoplasm of thyroid gland: Principal | ICD-10-CM

## 2022-10-19 DIAGNOSIS — Z1211 Encounter for screening for malignant neoplasm of colon: Principal | ICD-10-CM

## 2022-10-19 LAB — ALBUMIN / CREATININE URINE RATIO
ALBUMIN QUANT URINE: 2.1 mg/dL
ALBUMIN/CREATININE RATIO: 33.2 ug/mg — ABNORMAL HIGH (ref 0.0–30.0)
CREATININE, URINE: 63.2 mg/dL

## 2022-10-19 LAB — COMPREHENSIVE METABOLIC PANEL
ALBUMIN: 3.5 g/dL (ref 3.4–5.0)
ALKALINE PHOSPHATASE: 100 U/L (ref 46–116)
ALT (SGPT): 11 U/L (ref 10–49)
ANION GAP: 3 mmol/L — ABNORMAL LOW (ref 5–14)
AST (SGOT): 13 U/L (ref <=34)
BILIRUBIN TOTAL: 0.3 mg/dL (ref 0.3–1.2)
BLOOD UREA NITROGEN: 19 mg/dL (ref 9–23)
BUN / CREAT RATIO: 18
CALCIUM: 10.4 mg/dL (ref 8.7–10.4)
CHLORIDE: 100 mmol/L (ref 98–107)
CO2: 33 mmol/L — ABNORMAL HIGH (ref 20.0–31.0)
CREATININE: 1.04 mg/dL
EGFR CKD-EPI (2021) MALE: 85 mL/min/{1.73_m2} (ref >=60)
GLUCOSE RANDOM: 405 mg/dL (ref 70–179)
POTASSIUM: 3.8 mmol/L (ref 3.4–4.8)
PROTEIN TOTAL: 7.9 g/dL (ref 5.7–8.2)
SODIUM: 136 mmol/L (ref 135–145)

## 2022-10-19 LAB — LIPID PANEL
CHOLESTEROL/HDL RATIO SCREEN: 3.7 (ref 1.0–4.5)
CHOLESTEROL: 261 mg/dL — ABNORMAL HIGH (ref <=200)
HDL CHOLESTEROL: 71 mg/dL — ABNORMAL HIGH (ref 40–60)
LDL CHOLESTEROL CALCULATED: 145 mg/dL — ABNORMAL HIGH (ref 40–99)
NON-HDL CHOLESTEROL: 190 mg/dL — ABNORMAL HIGH (ref 70–130)
TRIGLYCERIDES: 225 mg/dL — ABNORMAL HIGH (ref 0–150)
VLDL CHOLESTEROL CAL: 45 mg/dL (ref 12–47)

## 2022-10-19 MED ORDER — PEN NEEDLE, DIABETIC 32 GAUGE X 5/32" (4 MM)
1 refills | 0 days | Status: CP
Start: 2022-10-19 — End: ?

## 2022-10-19 NOTE — Unmapped (Signed)
Immediately after or during the visit, I reviewed with the resident the medical history and the resident???s findings on physical examination.  I discussed with the resident the patient???s diagnosis and concur with the treatment plan as documented in the resident note. Theodore Demark, MD

## 2022-10-19 NOTE — Unmapped (Signed)
[facility]  Established Patient Clinic Note    Assessment/Plan:   PaulGangemi is a 52 y.o.male    1. Type 2 diabetes mellitus without complication, with long-term current use of insulin (CMS-HCC)  Patient with poorly controlled diabetes.  He is supposed to be taking insulin, however, he was unable to get needles for the pens and has not been using insulin for >2-3 months.   He states he is taking metformin - but seems to be only 1 tab twice a day.     Due to unclear picture on current medications, will have him follow up with pharmD in the next 1-2 weeks and then with me in 2 weeks. Patient told to bring all his medications to both apt to help guide treatment. Once we have a better idea of what he is taking, I will then start to titrate medications.     Current Medications:    --Metformin 500mg  2 tabs BID -- pt is only taking 1 tab twice a day  --Lantus 30u at bedtime -- Patient is NOT taking currently  --Atorvastatin 80 mg daily - ? taking  ---spironolactone 25mg  daily - ? taking  --amlodipine 10mg  - ?taking  --losartan- hydrochlorothiazide 100/25 - ?taking    Medication changes today:   --No changes today due to unclear what he is actually taking.     DMII prev med:   --Foot exam - done today - deficits on big toes bilaterally. No major callous or ulcers. Skin intact  --Eyes- DR screening referral placed today   --labs -   --A1C >14 today.   --Microalb/Cr (yearly, unless on ACE/ARB): ordered today   --BP (goal <140/90)-  above goal, see below for discussion  --ASCVD risk (q8yrs if well controlled): elevated. On statin, unsure compliance   --LDL- (<100 without complications, <70 with CAD)- pending      Orders:  - Microalbumin / creatinine urine ratio; Future  - Comprehensive Metabolic Panel; Future  - Lipid Panel; Future  - Ambulatory referral to [facility]; Future  - Ambulatory referral to Ophthalmology; Future  - Comprehensive Metabolic Panel  - Lipid Panel  - Microalbumin / creatinine urine ratio    2. Essential hypertension  Patient with long standing HTN not controlled today. States he took medications this morning but unsure what he took. I feel is poor control may be due to poor compliance rather than ineffective medications. Plan to see him back in 2 weeks to complete a full med review and make changes from there.     Regimen prescribed:   --spironolactone 25mg  daily - ? taking  --amlodipine 10mg  - ?taking  --losartan- hydrochlorothiazide 100/25 - ?taking  --Carvedilol 6.5mg  BID - ?taking    3. Thyroid cancer (CMS-HCC)  Followed by [facility] and Neck Onc clinic. Below is brief summary from most recent note. Appears treatment is palliative at this time. Unclear exact timeline but will try to get a better idea at follow up visits as this will help guide DM and routine health screenings.     - Tg: 75 (02/17/2020) --> 43 (06/24/2020) --> 212 (01/04/2021) --> 323 (02/01/21) ---> 502 (06/15/21) --> 662 (10/05/2021) --> 70 (02/01/2022) --> 107 (03/15/2022)  - Currently on Lenvima (02/21/21- current); notable for multiple missed doses  - CT Neck/Chest (03/15/2022): stable disease  - Continue Lenvima 18 mg daily  - Labs reviewed and acceptable  - reports some increased diff with swallowing at times, feels as if food sticks occ.    -  will refer for swallowing study and ST eval ordered by Surg Onc    4. Need for immunization against influenza  Given today   - INFLUENZA VACCINE IIV3(IM)(PF)6 MOS UP    5. Need for COVID-19 vaccine  Given today   - COVID-19 VAC, (49YR+) (COMIRNATY) MRNA PFIZER     6. Screening for colon cancer  Briefly discussed colonoscopy today. Unclear the overall utility of this given dx of thyroid cancer. Pt seemed interested so I placed a referral today and will discuss getting this done at next week. Otherwise, he can have that discussion with GI prior to screening to see if FIT or hemocult would be a better alternative.   - Colonoscopy; Future        Attending: Dr. Manson Passey    Subjective   [name] is a 52 y.o. male  coming to clinic today for the following issues:    Chief Complaint   Patient presents with    Establish Care    Foot Pain     Numbness;      HPI:    Paul Hines is a 52 year old male with history of metastatic thyroid cancer s/p resection by Dr. Azucena Fallen on 04/12/2020 and neck dissection on 07/28/2020, poorly controlled type 2 diabetes, hypertension, and ischemic cardiac disease status post cath in 2021.  He is here to establish care.    # Diabetes: Patient is unsure which medications he supposed to be on.  He states he is taking metformin as directed.  He was unable to get needles for insulin pens and has not been taking it for several months.  He denies any symptoms as far as polyuria or polydipsia.    # Metastatic papillary thyroid cancer: Followed by [facility] head and neck oncology clinic. Treatment goals are palliative.   - Tg: 75 (02/17/2020) --> 43 (06/24/2020) --> 212 (01/04/2021) --> 323 (02/01/21) ---> 502 (06/15/21) --> 662 (10/05/2021) --> 70 (02/01/2022) --> 107 (03/15/2022)  - Currently on Lenvima (02/21/21- current); notable for multiple missed doses  - CT Neck/Chest (03/15/2022): stable disease  - Continue Lenvima 18 mg daily    # Hypertension: Patient unsure about his current medication.  Based on record he should be taking amlodipine 10 mg daily, losartan 100 mg, HCTZ 25 mg, and carvedilol.  He is acutely hypertensive today. No symptoms         I have reviewed the problem list, medications, and allergies and have updated/reconciled them if needed.    Mr. Vanwhy  reports that he has been smoking cigarettes. He has been exposed to tobacco smoke. He has never used smokeless tobacco.  Health Maintenance   Topic Date Due    Pneumococcal Vaccine 0-64 (1 of 2 - PCV) Never done    Retinal Eye Exam  Never done    Urine Albumin/Creatinine Ratio  Never done    DTaP/Tdap/Td Vaccines (5 - Tdap) 02/12/1979    Zoster Vaccines (1 of 2) Never done    Colon Cancer Screening  Never done Hemoglobin A1c  09/20/2022    Serum Creatinine Monitoring  10/04/2023    Potassium Monitoring  10/04/2023    Foot Exam  10/19/2023    Hepatitis C Screen  Completed    COVID-19 Vaccine  Completed    Influenza Vaccine  Completed       Objective     VITALS: BP 146/101 Comment: avg-134/95 - Pulse 90  - Temp 36.5 ??C (97.7 ??F) (Skin)  - Ht 171.5 cm (5' 7.52)  -  Wt 75 kg (165 lb 6.4 oz)  - BMI 25.51 kg/m??     Physical Exam  Constitutional:       General: He is not in acute distress.     Appearance: Normal appearance. He is normal weight. He is not toxic-appearing.   HENT:      Head: Normocephalic and atraumatic.      Nose: Nose normal.   Eyes:      Extraocular Movements: Extraocular movements intact.      Conjunctiva/sclera: Conjunctivae normal.   Cardiovascular:      Rate and Rhythm: Normal rate.   Pulmonary:      Effort: Pulmonary effort is normal.   Musculoskeletal:      Cervical back: Normal range of motion and neck supple.   Skin:     General: Skin is warm.   Neurological:      Mental Status: He is alert and oriented to person, place, and time. Mental status is at baseline.         LABS/IMAGING  Office Visit on 10/19/2022   Component Date Value    HGB A1C, POC 10/19/2022 >14.0 (H)     EST AVERAGE GLUCOSE, POC 10/19/2022 >355     Sodium 10/19/2022 136     Potassium 10/19/2022 3.8     Chloride 10/19/2022 100     CO2 10/19/2022 33.0 (H)     Anion Gap 10/19/2022 3 (L)     BUN 10/19/2022 19     Creatinine 10/19/2022 1.04     BUN/Creatinine Ratio 10/19/2022 18     eGFR CKD-EPI (2021) Male 10/19/2022 85     Glucose 10/19/2022 405 (HH)     Calcium 10/19/2022 10.4     Albumin 10/19/2022 3.5     Total Protein 10/19/2022 7.9     Total Bilirubin 10/19/2022 0.3     AST 10/19/2022 13     ALT 10/19/2022 11     Alkaline Phosphatase 10/19/2022 100     Triglycerides 10/19/2022 225 (H)     Cholesterol 10/19/2022 261 (H)     HDL 10/19/2022 71 (H)     LDL Calculated 10/19/2022 166 (H)     VLDL Cholesterol Cal 10/19/2022 45     Chol/HDL Ratio 10/19/2022 3.7     Non-HDL Cholesterol 10/19/2022 190 (H)     FASTING 10/19/2022 No     Creat U 10/19/2022 63.2     Albumin Quantitative, Ur* 10/19/2022 2.1     Albumin/Creatinine Ratio 10/19/2022 33.2 (H)        [facility] [address] [address]  [place] [facility] [place] [facility]  [address], [place], [place] [address]  Telephone [phone]  Fax [phone]  [email or web address]

## 2022-10-19 NOTE — Unmapped (Signed)
Patient [facility] Cancer Support Encounter    [name] called the Patient & [facility] Provided active listening and emotional support as patient discussed current needs and challenges. Reports that his PCP scheduled follow-up appointments that he needs transportation arranged for. Working on self-efficacy and in previous interaction assisted with setting up MyChart on his new phone. [name] reports that he has ran out of allotted data for the month and does not have wifi at home. [name] does still have text messages for the month. Texted [name] the phone number to arrange his medical transportation, as well as appointment times and addresses. [name] plans to arrange transportation and will call/text me once arranged.     Paul Hines, [name], RN, [facility]   She/Her/Hers  Oncology Nurse Navigator - [name] [facility]  [facility]  [address] [place], [place] [address]    Center: [phone]  Voicemail: [phone]  [email or web address]

## 2022-10-20 NOTE — Unmapped (Unsigned)
Subjective     Reason for visit:    Paul Hines is a 52 y.o. male with a history of diabetes (type 2), who presents today for a diabetes pharmacotherapy visit.  Patient presents to this visit {Blank single:19197::alone,accompanied by *** who provides part of the history}.    Known DM Complications: no known complications    Date of Last Diabetes Related Visit: 10/19/22 with PCP    Action At Last Diabetes Related Visit:    Patient reported discrepancy in how he is taking medications vs. What has been prescribed and has been without DM supplies that prevents him from using insulin as prescribed. No medication changes were made at PCP visit, instead seeking to understand patient's current home regimen      Since Last visit / History of Present Illness:    Patient reports {AMB PHA Pt reports:21032589:o} plan from last visit. ***    Reported DM Regimen:    ***    DM medications tried in the past:   ***    Medication Adherence and Access:  Since last visit, patient {Blank single:19197::denies,reports,***} missing doses of ***.     SMBG {Blank single:19197::Per BG meter,Per patient provided BG log,Per patient memory,***}:    Overall, patient thinks that blood sugars are {Blank single:19197::***,lower,unchanged,higher} since last diabetes related visit.        Hypoglycemia:    Symptoms of hypoglycemia since last visit:  {Blank multiple:19197::yes,no,n/a,***}  If yes, it was treated by: {Blank multiple:19197::***,n/a}      DM Prevention:  Statin:  unclear if taking, prescribed atorvastatin 80 mg ; high intensity.   Aspirin - {Blank single:19197::indicated (primary prevention),indicated (secondary prevention),not indicated,unclear if indicated}; {Blank single:19197::***,Taking,Not taking,Intolerant to,Declines}    ACEI/ARB -  unclear if taking, prescribed losartan-hydrochlorothiazide ; Urine MA/CR Ratio - elevated urinary albumin excretion.  Last eye exam: no documentation; due now  Last foot exam: 10/19/22   Tobacco Use: Current smoker  Immunizations:   Immunization History   Administered Date(s) Administered    COVID-19 VAC,BIVALENT,MODERNA(BLUE CAP) 11/09/2020    COVID-19 VACCINE,MRNA(MODERNA)(PF) 03/06/2019, 04/03/2019, 12/18/2019, 06/08/2020    Covid-19 Vac, (68yr+) (Comirnaty) [employer]  10/19/2022    DTaP, Unspecified Formulation 12/13/1971, 02/09/1972, 04/26/1972, 10/03/1973    INFLUENZA VACCINE IIV3(IM)(PF)6 MOS UP 10/19/2022    Influenza Vaccine Quad(IM)6 MO-Adult(PF) 12/02/2020    Influenza Virus Vaccine, unspecified formulation 11/03/2020    Measles 12/13/1971    Mumps 03/28/1974    Polio Virus Vaccine, Unspecified Formulation 12/13/1971, 02/09/1972, 04/26/1972, 10/03/1973    Rubella 12/13/1971     _________________________________________________    Past Medical History: reviewed PMH in epic today    Social History:  Social History     Tobacco Use   Smoking Status Some Days    Types: Cigarettes    Passive exposure: Current   Smokeless Tobacco Never       Medications: Medications reviewed in EPIC medication station and updated today by the clinical pharmacist practitioner.         Objective   Vitals:  There were no vitals filed for this visit.    Wt Readings from Last 3 Encounters:   10/19/22 75 kg (165 lb 6.4 oz)   10/04/22 76.9 kg (169 lb 8 oz)   08/08/22 76.7 kg (169 lb)       There is no height or weight on file to calculate BMI.    The 10-year ASCVD risk score (Arnett DK, et al., 2019) is: 36.8%    Values used to calculate the  score:      Age: 26 years      Sex: Male      Is Non-Hispanic African American: Yes      Diabetic: Yes      Tobacco smoker: Yes      Systolic Blood Pressure: 146 mmHg      Is BP treated: Yes      HDL Cholesterol: 71 mg/dL      Total Cholesterol: 261 mg/dL    Note: For patients with SBP <90 or >200, Total Cholesterol <130 or >320, HDL <20 or >100 which are outside of the allowable range, the calculator will use these upper or lower values to calculate the patient???s risk score.      Labs:   Lab Results   Component Value Date    A1C >14.0 (H) 10/19/2022         Assessment/Plan:    1. Diabetes, type 2: uncontrolled per last A1c of >14.0%. Goal <7.0% per ADA guidelines.    Repeat A1c due ***.  Reviewed symptoms and treatment of hypoglycemia, including need to check BG prior to and following treatment.  SMBG instructions:  {AMB PHA SMBG Plan:21032599}      2. Hypertension: {Blank multiple:19197::uncontrolled,controlled,***} based on clinic BP of ***. Goal <*** per ADA guidelines.  Continue ***        Follow-up: Return to [facility] in *** for pharmacotherapy clinic and 10/4 to see PCP    Future Appointments   Date Time Provider Department Center   10/23/2022 10:30 AM Dwain Sarna, CPP [facility]   11/03/2022  9:00 AM Vernelle Emerald, DO [facility]   11/03/2022 11:00 AM ADULT ONC LAB [facility]   11/03/2022 12:00 PM Rivka Barbara, CPP [facility]   12/07/2022  2:30 PM Aumer, Carollee Herter, CCC-SLP [facility]   12/07/2022  2:30 PM [facility] RM 7 [facility] [place]   01/02/2023 11:00 AM Gabriel Carina, MD [facility]   02/20/2023  9:00 AM Elsie Stain, MD [facility]       I spent a total of {NUMBERS; 0-45 BY 5:10291} minutes {VISIT JYNW:29562} with the patient delivering clinical care and providing education/counseling.    _________________________________________________

## 2022-10-23 ENCOUNTER — Ambulatory Visit: Admit: 2022-10-23 | Payer: PRIVATE HEALTH INSURANCE

## 2022-10-24 MED FILL — LENVIMA 18 MG/DAY (10 MG X 1 AND 4 MG X 2) CAPSULE: ORAL | 30 days supply | Qty: 90 | Fill #1

## 2022-10-25 NOTE — Unmapped (Signed)
Provided follow-up counseling to Paul Hines, 52 y.o. for treatment of tobacco use/dependence.     SUMMARY: SW re-assessed pt's tobacco use status and followed up about treatment plan. Pt endorsed current daily tobacco use of 4cpd. Pt expressed continued interested and motivation in tobacco cessation. Pt has been using the nicotine patch with success. Pt denies current side effects as well as withdrawal symptoms. Pt is proud of his efforts so far. Pt shared he did not receive the nicotine lozenges but is still interested in using them. SW stated she will make sure lozenges are ready to be picked up at pt's next appt. SW and pt reviewed psychoeducation regarding behavioral techniques that can be used in tobacco cessation. SW and pt discussed behavioral strategies pt could adopt to help mitigate his urges and reduce his tobacco use (listed below). SW made plans to follow up with pt. Pt has SW's number if additional needs arise.    Tobacco Use Treatment  Program: [facility]  Type of Visit: Follow-up  Session Number: 4  Tobacco Use Treatment Visit: Talked with patient  Permission To Engage In Conversation Re: PHI w/ Visitors Present: n/a  Goals Of Session: Insight, increase, Assessment, Communication of feelings, Self-esteem, improve, Communication skills, improve, Behavior management, improve    Cancer Center Patients  Primary Service: Other (TA)  Primary Cancer Type: Head and Neck    Tobacco Use During Past 30 Days  Time Since Last Tobacco Use: smoked a cigarette today (at least one puff)  Tobacco Withdrawal (Past 24 Hours): None noted  Type of Tobacco Products Used: Cigarettes  Quantity Used: 4  Quantity Per: day  Relight Cigarettes: Yes  Time to First Use After Waking: Immediately  Wake During Sleep to Smoke: Never  Other Household Members Use Tobacco: Yes  Smoking Allowed in Home: Yes  Smoking Allowed in Vehicles: N/A  Smoke During Work Day: N/A    Tobacco Use History  Most Recent Attempt: Past year  Medications Used in Past Attempts: None    Behavioral Assessment  Why Uses: 1. Long Standing Habit 2. Stress 3. Those around him smoke  Reasons to Become Tobacco Free: 1. Health  Barriers/Challenges: 1. Habit 2. Stress  Strategies: 1. Nicotine patch and gum 2. Mindfulness 3. Behavioral Friction    Treatment Plan  Cessation Meds Currently Using: Patch 21mg   Outpatient/Discharge Medications Recommended: Patch 21mg , Gum 4mg   Plan to Obtain Outpatient Meds: Pt already has medication  Patient's Plan Post Discharge/Visit: Plan to quit as soon as possible  Follow-up Plan: Permission given, Phone follow-up scheduled  Family Members Included in Intervention/Plan: No    TTS Information  Diagnosis: Tobacco use disorder, unspecified, uncomplicated (F17.200)  Interventions: Assessed, Informed, Discussed, Motivational interviewing, Suggested, Taught, Encouraged  TTS Visit Length: >10 minutes    Khalessi Blough Valentina Lucks, LCSW-A  Tobacco Treatment Counselor  [facility] Tobacco Treatment Program  [phone]

## 2022-10-25 NOTE — Unmapped (Signed)
Patient [facility] Cancer Support Encounter    Received voicemail at the Patient & [facility] from Mr. Chiu.  Attempted to callback. No answer but left voicemail with my name, role, and direct callback number.     [name], [name], RN, [facility]   She/Her/Hers  Oncology Nurse Navigator - [name] [facility]  [facility]  [address] [place], [place] [address]    Center: [phone]  Voicemail: [phone]  [email or web address]

## 2022-10-25 NOTE — Unmapped (Signed)
Patient [facility] Cancer Support Encounter    Mr. Schlender called back. Provided active listening and emotional support as patient discussed current needs and challenges.    Reports he has not yet scheduled rides for appointments on 10/4. Plans to do tomorrow. [place] Spaniel he will call with update after he schedules.     [name], [name], RN, [facility]   She/Her/Hers  Oncology Nurse Navigator - [name] [facility]  [facility]  [address], [place], [place] [address]    Center: [phone]  Voicemail: [phone]  [email or web address]

## 2022-10-26 NOTE — Unmapped (Signed)
Patient was notified of operational disruptions. Patient opted to: schedule their refill with understanding of potential delay until 10/1 or later. This was facilitated by pharmacy staff     [facility] Clinical Assessment & Refill Coordination Note    Paul Hines, DOB: September 01, 1970  Phone: [phone] (home)     All above HIPAA information was verified with patient.     Was a Nurse, learning disability used for this call? No    Specialty Medication(s):   Inflammatory Disorders: Cosentyx     Current Outpatient Medications   Medication Sig Dispense Refill    alcohol swabs (ALCOHOL PREP PADS) PadM Use three times a day as needed. 100 each 0    amlodipine (NORVASC) 10 MG tablet Take 1 tablet (10 mg total) by mouth daily. 90 tablet 3    atorvastatin (LIPITOR) 80 MG tablet Take 1 tablet (80 mg total) by mouth nightly. 90 tablet 2    blood sugar diagnostic (ACCU-CHEK GUIDE TEST STRIPS) Strp Use to check blood glucose once daily. 100 strip 1    blood-glucose meter kit Test blood glucose level once daily. 1 each 0    carvedilol (COREG) 6.25 MG tablet Take 1 tablet (6.25 mg total) by mouth two (2) times a day. 180 tablet 3    cetirizine (ZYRTEC) 10 MG tablet Take 1 tablet (10 mg total) by mouth daily. 30 tablet 2    clindamycin (CLEOCIN T) 1 % lotion Apply topically two (2) times a day. 60 mL 11    empty container Misc Use as directed to dispose of Humira pens. 1 each 2    gabapentin (NEURONTIN) 300 MG capsule Take 1 capsule (300 mg total) by mouth two (2) times a day. 180 capsule 2    HUMIRA PEN CITRATE FREE STARTER PACK FOR CROHN'S/UC/HS 3 X 80 MG/0.8 ML Inject the contents of 2 pens (160 mg) on day 1, THEN 1 pen (80 mg) on day 15. 3 each 0    hydrOXYzine (ATARAX) 25 MG tablet Take 1 tablet (25 mg total) by mouth every eight (8) hours as needed. 30 tablet 0    ibuprofen (MOTRIN) 600 MG tablet Take 1 tablet (600 mg total) by mouth Three (3) times a day as needed for pain. 30 tablet 1    insulin glargine (BASAGLAR, LANTUS) 100 unit/mL (3 mL) injection pen Inject 0.3 mL (30 Units total) under the skin nightly. 15 mL 2    lancets Misc Use to check blood glucose once daily. 100 each 1    lenvatinib (LENVIMA) 18 mg/day (10 mg x 1 AND 4 mg x 2) cap Take one (10mg ) capsule plus two (4mg ) capsules by mouth daily for a total of 18mg  daily. 90 capsule 2    levothyroxine (SYNTHROID) 300 MCG tablet Take 1 tablet (300 mcg total) by mouth daily. 90 tablet 3    losartan-hydroCHLOROthiazide (HYZAAR) 100-25 mg per tablet Take 1 tablet by mouth daily. 90 tablet 3    metFORMIN (GLUCOPHAGE) 500 MG tablet Take 2 tablets (1,000 mg total) by mouth in the morning and 2 tablets (1,000 mg total) in the evening. Take with meals. 360 tablet 2    nicotine (NICODERM CQ) 21 mg/24 hr patch Place 1 patch on the skin and change daily. 28 patch 4    nicotine (NICODERM CQ) 21 mg/24 hr patch Place 1 patch on the skin daily. Remove old patch before applying new one. 28 patch 2    nicotine polacrilex (NICORETTE) 4 MG gum  Chew 1 piece (4 mg total) and park in cheek every hour as needed for smoking cessation. Max is up to 24 pieces/day. 110 each 2    pen needle, diabetic (TRUEPLUS PEN NEEDLE) 32 gauge x 5/32 (4 mm) Ndle Use with insulin glargine once daily 100 each 1    secukinumab (COSENTYX UNOREADY PEN) 300 mg/2 mL (150 mg/mL) PnIj Inject the contents of 1 pen (300mg ) under the skin weekly for 5 weeks as loading doses. (on weeks 0,1,2,3,4 as loading doses) 10 mL 0    secukinumab (COSENTYX UNOREADY PEN) 300 mg/2 mL (150 mg/mL) PnIj Inject the contents of 1 pen (300mg ) under the skin every 28 days as maintenance 2 mL 11    spironolactone (ALDACTONE) 25 MG tablet Take 1 tablet (25 mg total) by mouth daily. 90 tablet 3     No current facility-administered medications for this visit.        Changes to medications: Paul Hines reports no changes at this time.    Allergies   Allergen Reactions    Penicillin Anaphylaxis    Fish Derived     Lisinopril     Peanut Butter Flavor      Per allergy testing    Penicillins Other (See Comments)     Per patient, when he took at age 48 his legs and arms shook. Not sure if it was seizure or not.     Shellfish Containing Products     Soy        Changes to allergies: No    SPECIALTY MEDICATION ADHERENCE     Cosentyx Unoready 300mg /59mL : 1 doses of medicine on hand   Loading doses due: 09/30, 10/06       Medication Adherence    Patient reported X missed doses in the last month: 0  Specialty Medication: secukinumab (COSENTYX UNOREADY PEN) 300 mg/2 mL (150 mg/mL) PnIj  Informant: patient  Confirmed plan for next specialty medication refill: delivery by pharmacy  Refills needed for supportive medications: not needed          Specialty medication(s) dose(s) confirmed: Regimen is correct and unchanged.     Are there any concerns with adherence? No    Adherence counseling provided? Not needed    CLINICAL MANAGEMENT AND INTERVENTION      Clinical Benefit Assessment:    Do you feel the medicine is effective or helping your condition? Yes    Clinical Benefit counseling provided? Not needed    Adverse Effects Assessment:    Are you experiencing any side effects? No    Are you experiencing difficulty administering your medicine? No    Quality of Life Assessment:    Quality of Life    Rheumatology  Oncology  Dermatology  1. What impact has your specialty medication had on the symptoms of your skin condition (i.e. itchiness, soreness, stinging)?: Minimal  2. What impact has your specialty medication had on your comfort level with your skin?: Minimal  Cystic Fibrosis          How many days over the past month did your condition  keep you from your normal activities? For example, brushing your teeth or getting up in the morning. Patient declined to answer    Have you discussed this with your provider? Not needed    Acute Infection Status:    Acute infections noted within Epic:  No active infections  Patient reported infection: None    Therapy Appropriateness:    Is therapy appropriate based on current  medication list, adverse reactions, adherence, clinical benefit and progress toward achieving therapeutic goals? Yes, therapy is appropriate and should be continued     DISEASE/MEDICATION-SPECIFIC INFORMATION      For patients on injectable medications: Patient currently has 1 doses left.  Next injection is scheduled for 09/30.    Chronic Inflammatory Diseases: Have you experienced any flares in the last month? No  Has this been reported to your provider? Not applicable    PATIENT SPECIFIC NEEDS     Does the patient have any physical, cognitive, or cultural barriers? No    Is the patient high risk? No    Did the patient require a clinical intervention? No    Does the patient require physician intervention or other additional services (i.e., nutrition, smoking cessation, social work)? No    SOCIAL DETERMINANTS OF HEALTH     At the [facility], we have learned that life circumstances - like trouble affording food, housing, utilities, or transportation can affect the health of many of our patients.   That is why we wanted to ask: are you currently experiencing any life circumstances that are negatively impacting your health and/or quality of life? Patient declined to answer    Social Determinants of Health     Food Insecurity: No Food Insecurity (07/29/2020)    Hunger Vital Sign     Worried About Running Out of Food in the Last Year: Never true     Ran Out of Food in the Last Year: Never true   Internet Connectivity: Not on file   Housing/Utilities: Low Risk  (07/29/2020)    Housing/Utilities     Within the past 12 months, have you ever stayed: outside, in a car, in a tent, in an overnight shelter, or temporarily in someone else's home (i.e. couch-surfing)?: No     Are you worried about losing your housing?: No     Within the past 12 months, have you been unable to get utilities (heat, electricity) when it was really needed?: No   Tobacco Use: High Risk (10/19/2022)    Patient History     Smoking Tobacco Use: Some Days     Smokeless Tobacco Use: Never     Passive Exposure: Current   Transportation Needs: Unmet Transportation Needs (05/26/2022)    PRAPARE - Transportation     Lack of Transportation (Medical): Yes     Lack of Transportation (Non-Medical): Not on file   Alcohol Use: Alcohol Misuse (08/22/2021)    Received from [facility], [facility]    AUDIT-C     Frequency of Alcohol Consumption: 2-3 times a week     Average Number of Drinks: 5 or 6     Frequency of Binge Drinking: Weekly   Interpersonal Safety: Unknown (10/26/2022)    Interpersonal Safety     Unsafe Where You Currently Live: Not on file     Physically Hurt by Anyone: Not on file     Abused by Anyone: Not on file   Physical Activity: Not on file   Intimate Partner Violence: Not on file   Stress: Not on file   Substance Use: Not on file   Social Connections: Not on file   Financial Resource Strain: Low Risk  (07/29/2020)    Overall Financial Resource Strain (CARDIA)     Difficulty of Paying Living Expenses: Not hard at all   Depression: Not at risk (04/19/2022)    PHQ-2     PHQ-2 Score: 0  Health Literacy: Not on file       Would you be willing to receive help with any of the needs that you have identified today? Not applicable       SHIPPING     Specialty Medication(s) to be Shipped:   Inflammatory Disorders: Cosentyx    Other medication(s) to be shipped: No additional medications requested for fill at this time     Changes to insurance: No    Delivery Scheduled: Yes, Expected medication delivery date: 11/02/2022.     Medication will be delivered via Same Day Courier to the confirmed prescription address in [facility].    The patient will receive a drug information handout for each medication shipped and additional FDA Medication Guides as required.  Verified that patient has previously received a Conservation officer, historic buildings and a Surveyor, mining.    The patient or caregiver noted above participated in the development of this care plan and knows that they can request review of or adjustments to the care plan at any time.      All of the patient's questions and concerns have been addressed.    Elnora Morrison, PharmD   [facility] Specialty Pharmacist

## 2022-10-27 NOTE — Unmapped (Signed)
Patient [facility] Cancer Support Encounter    Mr. Spiering called the Patient & [facility]. Reports he has not set up his ride yet. Will update me on Monday.     [name], [name], RN, [facility]   She/Her/Hers  Oncology Nurse Navigator - [name] [facility]  [facility]  [address] [place], [place] [address]    Center: [phone]  Voicemail: [phone]  [email or web address]

## 2022-10-27 NOTE — Unmapped (Signed)
Patient [facility] Cancer Support Encounter    Called Mr. Hyneman to check in and make sure he was able to arrange rides for next week's appointments. No answer. Left voicemail with my name, role, reason for calling, and direct callback number.     [name], [name], RN, [facility]   She/Her/Hers  Oncology Nurse Navigator - [name] Patient [facility]  [facility]  [address], [place], [place] [address]    Center: [phone]  Voicemail: [phone]  [email or web address]

## 2022-10-30 NOTE — Unmapped (Signed)
Patient [facility] Cancer Support Encounter    Mr. Corpening called the Patient & [facility]. Provided active listening and emotional support as patient discussed current needs and challenges.    Mr. Weatherall unable to arrange rides because he cannot find the visit information in the MyChart application. Unable to find with walk-through over the phone. Will show on Friday.     Called [facility] [employer] to arrange rides for upcoming appointments. Trip number: [phone]    10/4: 7:50am pick-up from home to [facility]  10/4: 10:00am pick-up to [facility] main  10/4: 1:30pm pick-up from [facility] main to home    10/3: called Mr. Danis to update him that Medicaid transportation is picking him up at 7:50am    [name], [name], RN, [facility]   She/Her/Hers  Oncology Nurse Navigator - [name] Patient [facility]  [facility]  [address] [place], [place] [address]    Center: [phone]  Voicemail: [phone]  [email or web address]

## 2022-11-01 NOTE — Unmapped (Signed)
[facility]  Established Patient Clinic Note    Assessment/Plan:   Mr.Bless is a 52 y.o.male      1. Type 2 diabetes mellitus without complication, with long-term current use of insulin (CMS-HCC)  Patient with poorly controlled diabetes.  He is supposed to be taking insulin, however, he was unable to get needles for the pens and has not been using insulin for >2-3 months.   He states he is taking metformin - but seems to be only 1 tab twice a day.     Patient was instructed to bring medications today for full review to help guide treatment.  However he left home and left his bag there.  He is still confused and unsure what he is taken.  His glucose was over 500 today, I stressed the importance of restarting his insulin and getting on a better regimen.  He voiced understanding.  Will see him in 2 to 3 weeks.  Told him to bring logs and medications at that appointment.  Also, he has FM pharmacy consult to help manage medications as well.    Current Medications:    --Metformin 500mg  2 tabs BID -- pt is only taking 1 tab twice a day  --Lantus 30u at bedtime -- Patient is NOT taking currently  --Atorvastatin 80 mg daily - ? taking  --spironolactone 25mg  daily - ? taking  --amlodipine 10mg  - ?taking  --losartan- hydrochlorothiazide 100/25 - ?taking    Medication changes today:   --No changes today due to unclear what he is actually taking.     DMII prev med:   --Foot exam - UTD- deficits on big toes bilaterally. No major callous or ulcers. Skin intact  --Eyes- DR screening referral placed today   --labs -   --A1C >14 today.   --Microalb/Cr (yearly, unless on ACE/ARB): ordered today   --BP (goal <140/90)-  above goal, see below for discussion  --ASCVD risk (q42yrs if well controlled): elevated. On statin, unsure compliance   --LDL- (<100 without complications, <70 with CAD)- pending      2. Essential hypertension  Patient with long standing HTN. BP WNL this morning and pt says he did not take meds. I feel is poor control may be due to poor compliance rather than ineffective medications.  He was unable to bring his medications to today's visit.  Will follow-up in 2 to 3 weeks for full med reconciliation.  No changes today..     Regimen prescribed:   --spironolactone 25mg  daily - ? taking  --amlodipine 10mg  - ?taking  --losartan- hydrochlorothiazide 100/25 - ?taking  --Carvedilol 6.5mg  BID - ?taking    3. Thyroid cancer (CMS-HCC)  Followed by [facility] and Neck Onc clinic.  Based on their last notes this is palliative treatment.  He is unaware of his overall life expectancy and prognosis.  I asked him to have this discussion with the oncologist at the next visit.  I will message his nurse for more clarification as well..     Brief summary:   - Tg: 75 (02/17/2020) --> 43 (06/24/2020) --> 212 (01/04/2021) --> 323 (02/01/21) ---> 502 (06/15/21) --> 662 (10/05/2021) --> 70 (02/01/2022) --> 107 (03/15/2022)  - Currently on Lenvima (02/21/21- current); notable for multiple missed doses  - CT Neck/Chest (03/15/2022): stable disease  - Continue Lenvima 18 mg daily  - Labs reviewed and acceptable  - reports some increased diff with swallowing at times, feels as if food sticks occ.    -  will refer for swallowing study and ST eval ordered by Surg Onc    4. Need for immunization against influenza  Given today   - INFLUENZA VACCINE IIV3(IM)(PF)6 MOS UP    5. Need for COVID-19 vaccine  Given today   - COVID-19 VAC, (74YR+) (COMIRNATY) MRNA PFIZER     6. Screening for colon cancer  Briefly discussed colonoscopy again today. Unclear the overall utility of this given dx of thyroid cancer. Pt seemed interested.  Once we get a better idea of overall life expectancy/prognosis we can rediscuss utility of colon screening.      Attending: Dr. Manson Passey    Subjective   Mr. Minniefield is a 52 y.o. male  coming to clinic today for the following issues:    Chief Complaint   Patient presents with    Follow-up    Referral Setup     -colonoscopy HPI:    Mr. Poteete is a 52 year old male with history of metastatic thyroid cancer s/p resection by Dr. Azucena Fallen on 04/12/2020 and neck dissection on 07/28/2020, poorly controlled type 2 diabetes, hypertension, and ischemic cardiac disease status post cath in 2021.  He is here to establish care.    # Diabetes: Patient is unsure which medications he supposed to be on.  He states he is taking metformin as directed.  He was unable to get needles for insulin pens and has not been taking it for several months.  He denies any symptoms as far as polyuria or polydipsia.    # Metastatic papillary thyroid cancer: Followed by [facility] head and neck oncology clinic. Treatment goals are palliative.   - Tg: 75 (02/17/2020) --> 43 (06/24/2020) --> 212 (01/04/2021) --> 323 (02/01/21) ---> 502 (06/15/21) --> 662 (10/05/2021) --> 70 (02/01/2022) --> 107 (03/15/2022)  - Currently on Lenvima (02/21/21- current); notable for multiple missed doses  - CT Neck/Chest (03/15/2022): stable disease  - Continue Lenvima 18 mg daily    # Hypertension: Patient unsure about his current medication.  Based on record he should be taking amlodipine 10 mg daily, losartan 100 mg, HCTZ 25 mg, and carvedilol.  He is acutely hypertensive today. No symptoms         I have reviewed the problem list, medications, and allergies and have updated/reconciled them if needed.    Mr. Lebeau  reports that he has been smoking cigarettes. He has been exposed to tobacco smoke. He has never used smokeless tobacco.  Health Maintenance   Topic Date Due    Pneumococcal Vaccine 0-64 (1 of 2 - PCV) Never done    Retinal Eye Exam  Never done    DTaP/Tdap/Td Vaccines (5 - Tdap) [birth year]/12/1979    Zoster Vaccines (1 of 2) Never done    Colon Cancer Screening  Never done    Hemoglobin A1c  01/18/2023    Foot Exam  10/19/2023    Urine Albumin/Creatinine Ratio  10/19/2023    Serum Creatinine Monitoring  10/19/2023    Potassium Monitoring  10/19/2023    Hepatitis C Screen  Completed    COVID-19 Vaccine Completed    Influenza Vaccine  Completed       Objective     VITALS: BP 117/69 (BP Site: R Arm, BP Position: Sitting, BP Cuff Size: X-Large)  - Pulse 96  - Temp 36.2 ??C (97.2 ??F) (Temporal)  - Ht 170.2 cm (5' 7)  - Wt 74.2 kg (163 lb 9.6 oz)  - BMI 25.62 kg/m??     Physical Exam  Constitutional:  General: He is not in acute distress.     Appearance: Normal appearance. He is normal weight. He is not toxic-appearing.   HENT:      Head: Normocephalic and atraumatic.      Nose: Nose normal.   Eyes:      Extraocular Movements: Extraocular movements intact.      Conjunctiva/sclera: Conjunctivae normal.   Cardiovascular:      Rate and Rhythm: Normal rate.   Pulmonary:      Effort: Pulmonary effort is normal.   Musculoskeletal:      Cervical back: Normal range of motion and neck supple.   Skin:     General: Skin is warm.   Neurological:      Mental Status: He is alert and oriented to person, place, and time. Mental status is at baseline.       LABS/IMAGING  No visits with results within 3 Day(s) from this visit.   Latest known visit with results is:   Office Visit on 10/19/2022   Component Date Value    HGB A1C, POC 10/19/2022 >14.0 (H)     EST AVERAGE GLUCOSE, POC 10/19/2022 >355     Sodium 10/19/2022 136     Potassium 10/19/2022 3.8     Chloride 10/19/2022 100     CO2 10/19/2022 33.0 (H)     Anion Gap 10/19/2022 3 (L)     BUN 10/19/2022 19     Creatinine 10/19/2022 1.04     BUN/Creatinine Ratio 10/19/2022 18     eGFR CKD-EPI (2021) Male 10/19/2022 85     Glucose 10/19/2022 405 (HH)     Calcium 10/19/2022 10.4     Albumin 10/19/2022 3.5     Total Protein 10/19/2022 7.9     Total Bilirubin 10/19/2022 0.3     AST 10/19/2022 13     ALT 10/19/2022 11     Alkaline Phosphatase 10/19/2022 100     Triglycerides 10/19/2022 225 (H)     Cholesterol 10/19/2022 261 (H)     HDL 10/19/2022 71 (H)     LDL Calculated 10/19/2022 010 (H)     VLDL Cholesterol Cal 10/19/2022 45     Chol/HDL Ratio 10/19/2022 3.7     Non-HDL Cholesterol 10/19/2022 190 (H)     FASTING 10/19/2022 No     Creat U 10/19/2022 63.2     Albumin Quantitative, Ur* 10/19/2022 2.1     Albumin/Creatinine Ratio 10/19/2022 33.2 (H)        [facility]  [place] [facility] [place] [facility]  [address] [place], [place] [address]  Telephone [phone]  Fax [phone]  [email or web address]

## 2022-11-02 MED FILL — COSENTYX UNOREADY PEN 300 MG/2 ML (150 MG/ML) SUBCUTANEOUS: 28 days supply | Qty: 2 | Fill #1

## 2022-11-03 ENCOUNTER — Ambulatory Visit: Admit: 2022-11-03 | Discharge: 2022-11-03 | Payer: PRIVATE HEALTH INSURANCE

## 2022-11-03 ENCOUNTER — Ambulatory Visit: Admit: 2022-11-03 | Discharge: 2022-11-03 | Payer: PRIVATE HEALTH INSURANCE | Attending: Oncology | Primary: Oncology

## 2022-11-03 ENCOUNTER — Other Ambulatory Visit: Admit: 2022-11-03 | Discharge: 2022-11-03 | Payer: PRIVATE HEALTH INSURANCE

## 2022-11-03 DIAGNOSIS — C73 Malignant neoplasm of thyroid gland: Principal | ICD-10-CM

## 2022-11-03 LAB — CBC W/ AUTO DIFF
BASOPHILS ABSOLUTE COUNT: 0 10*9/L (ref 0.0–0.1)
BASOPHILS RELATIVE PERCENT: 0.6 %
EOSINOPHILS ABSOLUTE COUNT: 0.1 10*9/L (ref 0.0–0.5)
EOSINOPHILS RELATIVE PERCENT: 1.7 %
HEMATOCRIT: 38.6 % — ABNORMAL LOW (ref 39.0–48.0)
HEMOGLOBIN: 12.5 g/dL — ABNORMAL LOW (ref 12.9–16.5)
LYMPHOCYTES ABSOLUTE COUNT: 1.6 10*9/L (ref 1.1–3.6)
LYMPHOCYTES RELATIVE PERCENT: 22.5 %
MEAN CORPUSCULAR HEMOGLOBIN CONC: 32.4 g/dL (ref 32.0–36.0)
MEAN CORPUSCULAR HEMOGLOBIN: 23.4 pg — ABNORMAL LOW (ref 25.9–32.4)
MEAN CORPUSCULAR VOLUME: 72.4 fL — ABNORMAL LOW (ref 77.6–95.7)
MEAN PLATELET VOLUME: 9 fL (ref 6.8–10.7)
MONOCYTES ABSOLUTE COUNT: 0.5 10*9/L (ref 0.3–0.8)
MONOCYTES RELATIVE PERCENT: 7.2 %
NEUTROPHILS ABSOLUTE COUNT: 4.7 10*9/L (ref 1.8–7.8)
NEUTROPHILS RELATIVE PERCENT: 68 %
PLATELET COUNT: 240 10*9/L (ref 150–450)
RED BLOOD CELL COUNT: 5.33 10*12/L (ref 4.26–5.60)
RED CELL DISTRIBUTION WIDTH: 14.1 % (ref 12.2–15.2)
WBC ADJUSTED: 6.9 10*9/L (ref 3.6–11.2)

## 2022-11-03 LAB — COMPREHENSIVE METABOLIC PANEL
ALBUMIN: 3 g/dL — ABNORMAL LOW (ref 3.4–5.0)
ALKALINE PHOSPHATASE: 80 U/L (ref 46–116)
ALT (SGPT): <7 U/L — ABNORMAL LOW (ref 10–49)
ANION GAP: 6 mmol/L (ref 5–14)
AST (SGOT): 12 U/L (ref <=34)
BILIRUBIN TOTAL: 0.2 mg/dL — ABNORMAL LOW (ref 0.3–1.2)
BLOOD UREA NITROGEN: 18 mg/dL (ref 9–23)
BUN / CREAT RATIO: 21
CALCIUM: 9.1 mg/dL (ref 8.7–10.4)
CHLORIDE: 99 mmol/L (ref 98–107)
CO2: 29 mmol/L (ref 20.0–31.0)
CREATININE: 0.86 mg/dL
EGFR CKD-EPI (2021) MALE: >90 mL/min/{1.73_m2} (ref >=60)
GLUCOSE RANDOM: 538 mg/dL (ref 70–179)
POTASSIUM: 4.3 mmol/L (ref 3.4–4.8)
PROTEIN TOTAL: 6.7 g/dL (ref 5.7–8.2)
SODIUM: 134 mmol/L — ABNORMAL LOW (ref 135–145)

## 2022-11-03 LAB — T4, FREE: FREE T4: 1.35 ng/dL (ref 0.89–1.76)

## 2022-11-03 LAB — TSH: THYROID STIMULATING HORMONE: 0.016 u[IU]/mL — ABNORMAL LOW (ref 0.550–4.780)

## 2022-11-03 LAB — SLIDE REVIEW

## 2022-11-03 MED ORDER — HYDROXYZINE HCL 25 MG TABLET
ORAL_TABLET | Freq: Three times a day (TID) | ORAL | 0 refills | 10 days | Status: CP | PRN
Start: 2022-11-03 — End: 2022-11-03
  Filled 2022-11-03: qty 30, 10d supply, fill #0

## 2022-11-03 MED ORDER — ATORVASTATIN 80 MG TABLET
ORAL_TABLET | Freq: Every evening | ORAL | 2 refills | 90.00000 days | Status: CP
Start: 2022-11-03 — End: 2022-11-03
  Filled 2022-11-03: qty 90, 90d supply, fill #0

## 2022-11-03 MED ORDER — METFORMIN 500 MG TABLET
ORAL_TABLET | Freq: Two times a day (BID) | ORAL | 2 refills | 90.00000 days | Status: CP
Start: 2022-11-03 — End: 2022-11-03
  Filled 2022-11-03: qty 360, 90d supply, fill #0

## 2022-11-03 NOTE — Unmapped (Signed)
The following steps were taken to reduce the risk of falls:      - Patient oriented to environment  - Patient advised to wait to climb onto exam/procedure table/chair until clinic staff could assist  - Physical environment was clear of slip/trip hazards  - Footwear observed to ensure it was appropriate, if not, patient advised on importance of well fitted, closed toe, sturdy shoes with non-skid soles  - ???Preventing falls: Care instructions??? placed in patient AVS

## 2022-11-03 NOTE — Unmapped (Signed)
Subjective     Reason for visit:    Paul Hines is a 52 y.o. male with a history of diabetes (type 2), who presents today for a diabetes pharmacotherapy visit.  Patient presents to this visit alone.    Known DM Complications: no known complications    Date of Last Diabetes Related Visit: 10/19/22 with PCP    Action At Last Diabetes Related Visit:    Patient reported discrepancy in how he is taking medications vs. What has been prescribed and has been without DM supplies that prevents him from using insulin as prescribed. No medication changes were made at PCP visit, instead seeking to understand patient's current home regimen.     Since Last visit / History of Present Illness:    Patient reports fully implementing plan from last visit. Patient brings pill bottles to visit today, but states he isn't sure if he has more at home. He did not bring insulin today (in fridge). Together, we reviewed all medications that he brought with him to appointment. Today's visit primarily focused on reviewing each medication that patient brough in today.     Reported DM Regimen:    Metformin 500 mg 2 tablets in the morning  Lantus 15 units subcutaneous in the morning (8-10 am)     DM medications tried in the past:   N/a    Medication Adherence and Access:  Since last visit, patient denies missing doses of medications, though there are some gaps in fill history. Patient is prescribed carvedilol 6.25 mg BID and spironolactone 25 mg daily though does not have these bottles present today and states that he doesn't have any other bottles at home.      SMBG    :    Patient is not checking BG at this time. He states he needs a new glucometer. Interested in CGM.       Hypoglycemia:    Symptoms of hypoglycemia since last visit:  no  If yes, it was treated by: n/a      DM Prevention:  Statin:  unclear if taking, prescribed atorvastatin 80 mg ; high intensity.   Aspirin - unclear if indicated; Not taking    ACEI/ARB -  unclear if taking, prescribed losartan-hydrochlorothiazide ; Urine MA/CR Ratio - elevated urinary albumin excretion.  Last eye exam: no documentation; due now  Last foot exam: 10/19/22   Tobacco Use: Current smoker  Immunizations:   Immunization History   Administered Date(s) Administered    COVID-19 VAC,BIVALENT,MODERNA(BLUE CAP) 11/09/2020    COVID-19 VACCINE,MRNA(MODERNA)(PF) 03/06/2019, 04/03/2019, 12/18/2019, 06/08/2020    Covid-19 Vac, (26yr+) (Comirnaty) [employer]  10/19/2022    DTaP, Unspecified Formulation 12/13/1971, 02/09/1972, 04/26/1972, 10/03/1973    INFLUENZA VACCINE IIV3(IM)(PF)6 MOS UP 10/19/2022    Influenza Vaccine Quad(IM)6 MO-Adult(PF) 12/02/2020    Influenza Virus Vaccine, unspecified formulation 11/03/2020    Measles 12/13/1971    Mumps 03/28/1974    Pneumococcal Conjugate 20-valent 11/03/2022    Polio Virus Vaccine, Unspecified Formulation 12/13/1971, 02/09/1972, 04/26/1972, 10/03/1973    Rubella 12/13/1971    SHINGRIX-ZOSTER VACCINE (HZV),RECOMBINANT,ADJUVANTED(IM) 11/03/2022    TdaP 11/03/2022     _________________________________________________    Past Medical History: reviewed PMH in epic today    Social History:  Social History     Tobacco Use   Smoking Status Some Days    Types: Cigarettes    Passive exposure: Current   Smokeless Tobacco Never       Medications: Medications reviewed in EPIC medication station and  updated today by the clinical pharmacist practitioner.         Objective   Vitals:  There were no vitals filed for this visit.    Wt Readings from Last 3 Encounters:   11/03/22 74.2 kg (163 lb 9.6 oz)   10/19/22 75 kg (165 lb 6.4 oz)   10/04/22 76.9 kg (169 lb 8 oz)       There is no height or weight on file to calculate BMI.    The 10-year ASCVD risk score (Arnett DK, et al., 2019) is: 25.9%    Values used to calculate the score:      Age: 33 years      Sex: Male      Is Non-Hispanic African American: Yes      Diabetic: Yes      Tobacco smoker: Yes      Systolic Blood Pressure: 117 mmHg Is BP treated: Yes      HDL Cholesterol: 71 mg/dL      Total Cholesterol: 261 mg/dL    Note: For patients with SBP <90 or >200, Total Cholesterol <130 or >320, HDL <20 or >100 which are outside of the allowable range, the calculator will use these upper or lower values to calculate the patient???s risk score.      Labs:   Lab Results   Component Value Date    A1C >14.0 (H) 10/19/2022    A1C 11.4 (H) 06/20/2022    A1C 9.8 (H) 02/01/2022         Assessment/Plan:    1. Diabetes, type 2: uncontrolled per last A1c of >14.0%. Goal <7.0% per ADA guidelines. Per patient, he is taking 2 tablets of metformin 500 mg daily in the morning. He is taking Lantus 15 units subcutaneous in the morning. He does not check his BG because he does not have glucometer. Will send in new prescriptions for glucometer, lancets, strips today. We discussed the importance of controlling BG to prevent microvascular and macrovascular complications of uncontrolled T2DM. Discussed that reaching optimal BG ranges will likely take a number of months and will require frequent discussion with pharmacist and PCP. With once daily insulin injection, patient will qualify for continuous glucose monitor (CGM).   Provided education on Dexcom G7 and [employer] 3. Patient would like to use a reciever, not an app on phone. Will send in prescriptions for Dexcom today (patient selected due to application device) and complete required prior auth. Next visit will focus solely on setting up Dexcom. Unable to safely adjust insulin today given no BG data though suspect patient requires more than 15 units daily given reported symptoms of frequent urination, thirst, and fatigue. Encouraged patient to increase metformin dose to 2 tablets BID as prescribed. Unknown other medications patient has trialed. GLP1 agonists not good option given thyroid cancer. SGLT2 not preferred until A1c <10% given risk for urogenital infection, though would aid microalbuminuria.   Continue Lantus 15 units daily   Try to take around the same time each day  Continue metformin 500 mg 2 tablets in the morning. Increase to metformin 2 tablets BID after taking 2 tablets daily for 7 days continuously  Repeat A1c due 01/18/23.  Reviewed symptoms and treatment of hypoglycemia, including need to check BG prior to and following treatment.  SMBG instructions:  once daily fasting and HS      2. Hypertension: uncontrolled DBP based on clinic BP of 128/85 mmHg. Goal <130/80 mmHg per ADA guidelines. Per patient report and medication  bottles today, he is taking amlodipine 10 mg and losartan-hydrochlorothiazide 100-25 mg daily and is not taking spironolactone 25 mg or carvedilol 6.25 mg BID.  Continue amlodipine 10 mg daily and losartan-hydrochlorothiazide 100-25 mg daily    3. Future visit: address lipid panel, microalbuminuria, DM health maintenance      Follow-up: Return to [facility] in 2 weeks for pharmacotherapy clinic and 10/31 to see PCP    Future Appointments   Date Time Provider Department Center   11/03/2022 11:00 AM ADULT ONC LAB [facility]   11/03/2022 12:00 PM Rivka Barbara, CPP [facility]   11/13/2022  9:00 AM Dwain Sarna, CPP [facility]   11/30/2022  9:00 AM Vernelle Emerald, DO [facility]   12/07/2022  2:30 PM Aumer, Carollee Herter, CCC-SLP [facility]   12/07/2022  2:30 PM [facility] RM 7 [facility] [place]   01/02/2023 11:00 AM Gabriel Carina, MD [facility]   02/20/2023  9:00 AM Elsie Stain, MD [facility]       I spent a total of 45 minutes face to face with the patient delivering clinical care and providing education/counseling.    _________________________________________________      Karalee Height, PharmD CPP  [facility] Clinical Pharmacist

## 2022-11-03 NOTE — Unmapped (Signed)
Clinical Pharmacist Practitioner: [facility]    Patient Name: Paul Hines  Patient Age: 52 y.o.  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 Last CT scans on 09/20/2022 showed SD. He reports improved adherence with Lenvima, only missing 1-2 doses/month now. Last TG down to 46 from 134; repeat TG today pending.  Continue Lenvima 18 mg daily  Repeat scans in 6 weeks    Thyroglobulin, Tumor Marker, IA (ng/mL)   Date Value   10/04/2022 46 (H)   08/08/2022 134 (H)   06/20/2022 147 (H)   03/15/2022 107 (H)   02/01/2022 70 (H)     2. HTN: clinic BP 154/99 today. Not checking BP at home. Reports not taking any meds this morning as he was rushing out the door to appointments. That said, he states he is taking 4 BP meds but its unclear if/how he is taking them. He has now established care with FM so we will work closely with them to improve BP control as this does affect his ongoing treatment with Lenvima.  Continue amlodipine 10 mg daily, carvedilol 6.25 mg BID, Hyzaar 100-25 mg daily, and spironalactone 25 mg daily  F/u with [facility] CPP on 10/14    3. Type 2 diabetes: poorly controlled. Hgb A1c 11.4% on 06/20/22, up from 9.8% on 02/01/22. He states he is only taking 1 tablet of metformin daily now and ran out of insulin needles so hasn't been using insulin for 1-2 months. BG today very high at 538 so will titrate up on metformin and slowly introduce insulin back, but this will be managed by FM from now on.  Increase metformin to 500 mg BID x 1 week then 1 gm BID thereafter  Restart insulin glargine at 15 units daily; further management per FM  F/u with [facility] CPP on 10/14    4. Hidrandenitis suppurativa: followed by dermatology. Recently changed from Humira to Cosentyx. Received refill yesterday, discussed change from weekly to q28d dosing now that he completed loading doses.  Continue Cosentyx 300 mg SQ q28d  F/u with derm on 12/3    5. Postsurgical hypothyroidism: goal for TSH suppression (<0.1). TSH much improved today to 0.016 with normal free T4.  Continue levothyroxine 300 mcg daily    6. Peripheral neuropathy: likely 2/2 to T2DM. Stable  Continue gabapentin 300 mg BID    7. Neck pain: stable, relieved with prn ibuprofen.  Continue ibuprofen 600 mg q6h prn    F/U: RTC in 6 weeks for scans and appt w/ Dr Rosana Hoes    ______________________________________________________________________    HPI: Paul Hines is a 52 y.o. male with history of T2DM, hypertension, ischemic cardiac disease s/p catheterization in 2021 and metastatic thyroid cancer, s/p resection with Dr. Azucena Fallen on 04/12/20 and neck dissection on 07/28/20. Started Lenvima on 02/21/21 although has had issues with noncompliance.     Oral chemotherapy: Lenvima 18 mg daily  Start date: 02/21/21   Specialty pharmacy: [facility]  Copay: $4    Interim History:   - overall feeling well although feels he has a flare from his HS  - R neck continues to feel stiff, having trouble at times with swallowing -- has swallow test 11/7  - did not take any meds this morning  -  having some dry skin around mouth  - fatigue is stable  - no diarrhea, nausea, HFS    Adherence: confirms taking appropriately, missing 1-2 doses per month  Drug-Drug Interactions: none per current med list    Oncology History:  Hematology/Oncology History   Thyroid cancer (CMS-HCC)   03/09/2020 Initial Diagnosis    Thyroid cancer (CMS-HCC)     07/29/2021 -  Cancer Staged    Staging form: Thyroid - Differentiated, AJCC 8th Edition  - Clinical: Stage II (cM1, Age at diagnosis: < 55 years) - Signed by Dionicia Abler, DO on 07/29/2021           Problem List:   Patient Active Problem List   Diagnosis    Abnormal computed tomography angiography (CTA)    Thyroid cancer (CMS-HCC)    Obesity Right inguinal hernia    Diabetes (CMS-HCC)    Essential hypertension    Hidradenitis suppurativa    Postoperative hypothyroidism       Medications:  Current Outpatient Medications   Medication Sig Dispense Refill    alcohol swabs (ALCOHOL PREP PADS) PadM Use three times a day as needed. 100 each 0    amlodipine (NORVASC) 10 MG tablet Take 1 tablet (10 mg total) by mouth daily. 90 tablet 3    atorvastatin (LIPITOR) 80 MG tablet Take 1 tablet (80 mg total) by mouth nightly. 90 tablet 2    blood sugar diagnostic (ACCU-CHEK GUIDE TEST STRIPS) Strp Use to check blood glucose once daily. 100 strip 1    carvedilol (COREG) 6.25 MG tablet Take 1 tablet (6.25 mg total) by mouth two (2) times a day. 180 tablet 3    cetirizine (ZYRTEC) 10 MG tablet Take 1 tablet (10 mg total) by mouth daily. 30 tablet 2    clindamycin (CLEOCIN T) 1 % lotion Apply topically two (2) times a day. 60 mL 11    empty container Misc Use as directed to dispose of Humira pens. 1 each 2    gabapentin (NEURONTIN) 300 MG capsule Take 1 capsule (300 mg total) by mouth two (2) times a day. 180 capsule 2    hydrOXYzine (ATARAX) 25 MG tablet Take 1 tablet (25 mg total) by mouth every eight (8) hours as needed. 30 tablet 0    ibuprofen (MOTRIN) 600 MG tablet Take 1 tablet (600 mg total) by mouth Three (3) times a day as needed for pain. 30 tablet 1    insulin glargine (BASAGLAR, LANTUS) 100 unit/mL (3 mL) injection pen Inject 0.3 mL (30 Units total) under the skin nightly. 15 mL 2    lancets Misc Use to check blood glucose once daily. 100 each 1    lenvatinib (LENVIMA) 18 mg/day (10 mg x 1 AND 4 mg x 2) cap Take one (10mg ) capsule plus two (4mg ) capsules by mouth daily for a total of 18mg  daily. 90 capsule 2    levothyroxine (SYNTHROID) 300 MCG tablet Take 1 tablet (300 mcg total) by mouth daily. 90 tablet 3    losartan-hydroCHLOROthiazide (HYZAAR) 100-25 mg per tablet Take 1 tablet by mouth daily. 90 tablet 3    metFORMIN (GLUCOPHAGE) 500 MG tablet Take 2 tablets (1,000 mg total) by mouth in the morning and 2 tablets (1,000 mg total) in the evening. Take with meals. 360 tablet 2    nicotine (NICODERM CQ) 21 mg/24 hr patch Place 1 patch on the skin and change daily. 28 patch 4    nicotine (NICODERM CQ) 21 mg/24 hr patch  Place 1 patch on the skin daily. Remove old patch before applying new one. 28 patch 2    nicotine polacrilex (NICORETTE) 4 MG gum Chew 1 piece (4 mg total) and park in cheek every hour as needed for smoking cessation. Max is up to 24 pieces/day. 110 each 2    pen needle, diabetic (TRUEPLUS PEN NEEDLE) 32 gauge x 5/32 (4 mm) Ndle Use with insulin glargine once daily 100 each 1    secukinumab (COSENTYX UNOREADY PEN) 300 mg/2 mL (150 mg/mL) PnIj Inject the contents of 1 pen (300mg ) under the skin weekly for 5 weeks as loading doses. (on weeks 0,1,2,3,4 as loading doses) 10 mL 0    secukinumab (COSENTYX UNOREADY PEN) 300 mg/2 mL (150 mg/mL) PnIj Inject the contents of 1 pen (300mg ) under the skin every 28 days as maintenance 2 mL 11    spironolactone (ALDACTONE) 25 MG tablet Take 1 tablet (25 mg total) by mouth daily. 90 tablet 3    blood-glucose meter kit Test blood glucose level once daily. 1 each 0     No current facility-administered medications for this visit.       Allergies:  Allergies   Allergen Reactions    Penicillin Anaphylaxis    Fish Derived     Lisinopril     Peanut Butter Flavor      Per allergy testing    Penicillins Other (See Comments)     Per patient, when he took at age 41 his legs and arms shook. Not sure if it was seizure or not.     Shellfish Containing Products     Soy        Personal and Social History:   Social History     Tobacco Use    Smoking status: Some Days     Types: Cigarettes     Passive exposure: Current    Smokeless tobacco: Never   Substance Use Topics    Alcohol use: Yes   He reports that he does not currently use drugs after having used the following drugs: Marijuana.    Family History:  His family history is not on file.    Review of Systems: A complete review of systems was obtained including: Constitutional, Eyes, ENT, Cardiovascular, Respiratory, GI, GU, Musculoskeletal, Skin, Neurological, Psychiatric, Endocrine, Heme/Lymphatic, and Allergic/Immunologic systems. All other systems reviewed and are negative to the patient???s management except for what was mentioned in the interim history.     Vital Signs: BP 154/99  - Pulse 83  - Temp 36.3 ??C (97.3 ??F) (Temporal)  - Resp 16  - Ht 170.2 cm (5' 7)  - Wt 75.5 kg (166 lb 6.4 oz)  - SpO2 99%  - BMI 26.06 kg/m??     Laboratory Data:  Lab on 11/03/2022   Component Date Value    Sodium 11/03/2022 134 (L)     Potassium 11/03/2022 4.3     Chloride 11/03/2022 99     CO2 11/03/2022 29.0     Anion Gap 11/03/2022 6     BUN 11/03/2022 18     Creatinine 11/03/2022 0.86     BUN/Creatinine Ratio 11/03/2022 21     eGFR CKD-EPI (2021) Male 11/03/2022 >90     Glucose 11/03/2022 538 (HH)     Calcium 11/03/2022 9.1     Albumin 11/03/2022 3.0 (L)     Total Protein 11/03/2022 6.7     Total Bilirubin 11/03/2022 0.2 (L)     AST 11/03/2022 12  ALT 11/03/2022 <7 (L)     Alkaline Phosphatase 11/03/2022 80     Free T4 11/03/2022 1.35     TSH 11/03/2022 0.016 (L)     WBC 11/03/2022 6.9     RBC 11/03/2022 5.33     HGB 11/03/2022 12.5 (L)     HCT 11/03/2022 38.6 (L)     MCV 11/03/2022 72.4 (L)     MCH 11/03/2022 23.4 (L)     MCHC 11/03/2022 32.4     RDW 11/03/2022 14.1     MPV 11/03/2022 9.0     Platelet 11/03/2022 240     Neutrophils % 11/03/2022 68.0     Lymphocytes % 11/03/2022 22.5     Monocytes % 11/03/2022 7.2     Eosinophils % 11/03/2022 1.7     Basophils % 11/03/2022 0.6     Absolute Neutrophils 11/03/2022 4.7     Absolute Lymphocytes 11/03/2022 1.6     Absolute Monocytes 11/03/2022 0.5     Absolute Eosinophils 11/03/2022 0.1     Absolute Basophils 11/03/2022 0.0     Microcytosis 11/03/2022 Slight (A)     Hypochromasia 11/03/2022 Marked (A)     Smear Review Comments 11/03/2022 See Comment (A) I spent 20 minutes with Mr.Gittens in direct patient care.    Bobby Rumpf, PharmD, BCOP, CPP  Clinical Pharmacist Practitioner, [facility] and [facility]

## 2022-11-03 NOTE — Unmapped (Signed)
Hydroxyzine is the pill to help with itching    Increase metformin to 1 pill twice a day for a week, then increase to 2 pills twice a day    OK to restart insulin after you are on 2 pills twice a day of metformin, but start with a lower dose (15 units) and see how that does for your sugars.    Please bring all your medications to all future appointments.

## 2022-11-07 NOTE — Unmapped (Signed)
Patient [facility] Cancer Support Encounter    Contacted by Dr. Ulice Brilliant to connect with Mr. Heidinger about a form needed for disability. Attempted to call Mr. Lunde x2. No answer or voicemail on both phone numbers. Will try again.    Called [facility] [employer] to arrange ride for [employer] assessment on 10/21 at 1:00pm. Pick-up at 11:50pm, return trip scheduled for 3:00pm. Confirmation number: [id number]. Will update Mr. Kestenbaum about ride next time we speak.     [name], [name], RN, [facility]   She/Her/Hers  Oncology Nurse Navigator - [name] [facility]  [facility]  [address] [place], [place] [address]    Center: [phone]  Voicemail: [phone]  [email or web address]

## 2022-11-09 NOTE — Unmapped (Signed)
Patient [facility] Cancer Support Encounter    Message from Mr. Mastin on the Patient & [facility] phone. Attempted to call back. No answer, but left HIPAA-compliant voicemail with my name and a callback request.     Called [employer] [employer] to arrange transportation for upcoming appointments while Mr. Prokop is unable to access MyChart.   10/14: 7:45am pick-up and 9:45am return ([facility] will call Mr. Rodrick to confirm)  10/31: 7:45am pick-up and 10:00am return (reference number: [id number])    Will update Mr. Wittstruck when he calls back.     Mr. Kammer called back. Updated about upcoming rides and prescriptions.     [name], [name], RN, [facility]   She/Her/Hers  Oncology Nurse Navigator - [name] Patient [facility]  [facility]  [address] [place], [place] [address]    Center: [phone]  Voicemail: [phone]  [email or web address]

## 2022-11-09 NOTE — Unmapped (Signed)
Patient [facility] Cancer Support Encounter    Mr. Dahman called the Patient & [facility] back and left message. I called Mr. Bradbury back. Discussed upcoming ride to [employer]. Provided active listening and emotional support as patient discussed current needs and challenges. Mr. Mizrachi continues to have challenges with accessing MyChart. Needs additional assistance with arranging transportation until able to fix. Attempted to restart application but screen continues to be blank when he attempts to log-in to MyChart. Plans to come to [facility], when able, for assistance.     Contacted previously by PCP to clarify form discussed in recent appointment. Asked Mr. Henao about form he discussed with PCP. Clarified and determined that he is requesting a form be submitted for disability. Mr. Ricca applied for disability a while ago and was denied. Discussed different ways to apply. Mr. Reigner would prefer to apply in person at the DSS and plans to go before his PCP appointment on 10/31. Will follow-up.    Requested prescription to outpatient pharmacy. Will ask Lawernce Ion.     [name], [name], RN, [facility]   She/Her/Hers  Oncology Nurse Navigator - [name] [facility]  [facility]  [address], [place], [place] [address]    Center: [phone]  Voicemail: [phone]  [email or web address]

## 2022-11-10 DIAGNOSIS — C73 Malignant neoplasm of thyroid gland: Principal | ICD-10-CM

## 2022-11-10 MED ORDER — ACETAMINOPHEN 500 MG TABLET
ORAL_TABLET | Freq: Four times a day (QID) | ORAL | 2 refills | 15 days | Status: CP | PRN
Start: 2022-11-10 — End: ?

## 2022-11-10 MED ORDER — IBUPROFEN 400 MG TABLET
ORAL_TABLET | ORAL | 2 refills | 30 days | Status: CP | PRN
Start: 2022-11-10 — End: ?
  Filled 2022-11-21: qty 90, 30d supply, fill #0

## 2022-11-13 ENCOUNTER — Institutional Professional Consult (permissible substitution): Admit: 2022-11-13 | Discharge: 2022-11-14 | Payer: PRIVATE HEALTH INSURANCE

## 2022-11-13 MED ORDER — PEN NEEDLE, DIABETIC 31 GAUGE X 5/16" (8 MM)
11 refills | 0 days | Status: CP
Start: 2022-11-13 — End: 2023-11-13

## 2022-11-13 MED ORDER — DEXCOM G7 RECEIVER
0 refills | 0 days | Status: CP
Start: 2022-11-13 — End: ?

## 2022-11-13 MED ORDER — DEXCOM G7 SENSOR DEVICE
3 refills | 0 days | Status: CP
Start: 2022-11-13 — End: ?

## 2022-11-13 MED ORDER — BLOOD GLUCOSE TEST STRIPS
ORAL_STRIP | 11 refills | 0 days | Status: CP
Start: 2022-11-13 — End: 2023-11-13
  Filled 2022-12-19: qty 9, 90d supply, fill #0

## 2022-11-13 MED ORDER — BLOOD-GLUCOSE METER KIT WRAPPER
11 refills | 0 days | Status: CP
Start: 2022-11-13 — End: 2023-11-13

## 2022-11-13 MED ORDER — LANCETS
11 refills | 0 days | Status: CP
Start: 2022-11-13 — End: 2023-11-13
  Filled 2022-12-19: qty 100, 100d supply, fill #0

## 2022-11-13 NOTE — Unmapped (Signed)
Dear Paul Hines,    It was great talking with you today! Here is a summary of our visit.    Medication Changes   Medications for blood pressure: amlodipine 10 mg 1 tablet daily and losartan-hydrochlorothiazide 100-25 mg 1 tablet daily  Take 15 units insulin around the same time every day  We will send in prescription for both a blood sugar meter and a continuous monitor. At our next visit, we will discuss your continuous monitor and set it up (the [facility])    Return to clinic on: with pharmacist Raynelle Fanning on Monday, October 28th at 9 am    How often and when to check blood sugar:  Check 1 times each day:  before breakfast

## 2022-11-13 NOTE — Unmapped (Signed)
Patient [facility] Cancer Support Encounter    [name] called and left a voicemail. Called [name] back, no answer. Left HIPAA-compliant voicemail with my name, role, and callback request.     [name] called back. Reports that he has another appointment added to his schedule. Plans to come in tomorrow to pick up medications from pharmacy and stop into the [name] for assistance with phone.     Paul Hines, [name], RN, [facility]   She/Her/Hers  Oncology Nurse Navigator - [name] [facility]  [facility]  [address], [place], [place] [address]    Center: [phone]  Voicemail: [phone]  [email or web address]

## 2022-11-16 NOTE — Unmapped (Signed)
Provided follow-up counseling to Paul Hines, 52 y.o. for treatment of tobacco use/dependence.     SUMMARY: SW re-assessed pt's tobacco use status and followed up about treatment plan. Pt endorsed current daily tobacco use. Pt expressed continued interest in tobacco cessation counseling. Pt reported he does not have the nicotine gum yet. SW confirmed the nicotine gum should be ready this afternoon at the pharmacy. Pt confirmed he is able to pick up Monday or Tuesday. Pt stated he was busy and requested a callback next week. SW will callback Wednesday 11/22/2022. Call ended well.     Tobacco Use Treatment  Program: [facility]  Type of Visit: Scheduling  Tobacco Use Treatment Visit: Talked with patient  Permission To Engage In Conversation Re: PHI w/ Visitors Present: n/a  Goals Of Session: Insight, increase, Assessment, Communication of feelings    Cancer Center Patients  Primary Service: Other (TA)  Primary Cancer Type: Head and Neck    Tobacco Use During Past 30 Days  Time Since Last Tobacco Use: smoked a cigarette today (at least one puff)  Tobacco Withdrawal (Past 24 Hours): None noted  Type of Tobacco Products Used: Cigarettes  Relight Cigarettes: Yes  Time to First Use After Waking: Immediately  Wake During Sleep to Smoke: Never  Other Household Members Use Tobacco: Yes  Smoking Allowed in Home: Yes  Smoking Allowed in Vehicles: N/A  Smoke During Work Day: N/A    Tobacco Use History  Most Recent Attempt: Past year  Medications Used in Past Attempts: None    Behavioral Assessment  Why Uses: 1. Long Standing Habit 2. Stress 3. Those around him smoke  Reasons to Become Tobacco Free: 1. Health  Barriers/Challenges: 1. Habit 2. Stress  Strategies: 1. Nicotine patch and gum 2. Mindfulness 3. Behavioral Friction    Treatment Plan  Cessation Meds Currently Using: Patch 21mg   Outpatient/Discharge Medications Recommended: Patch 21mg , Gum 4mg   Plan to Obtain Outpatient Meds: Pt already has medication  Patient's Plan Post Discharge/Visit: Plan to quit as soon as possible  Follow-up Plan: Permission given, Phone follow-up scheduled  Family Members Included in Intervention/Plan: No    TTS Information  Diagnosis: Tobacco use disorder, unspecified, uncomplicated (F17.200)  Interventions: Assessed, Discussed, Informed  TTS Visit Length: Less than 3 minutes    Autry Droege Valentina Lucks, LCSW-A  Tobacco Treatment Counselor  [facility] Tobacco Treatment Program  [phone]

## 2022-11-20 NOTE — Unmapped (Signed)
Patient [facility] Cancer Support Encounter    Called Mr. Rykowski to remind him of his appointment at [facility] today. Mr. Pon plans to be ready for his ride at 11:50am.     [facility] Complete to arrange transportation for appointment add-on. Will update Mr. Momon in next conversation.    10/28- Pick-up at 7:45am and return trip at 9:45am (trip number: [id number])    [name], [name], RN, [facility]   She/Her/Hers  Oncology Nurse Navigator - [name] Patient [facility]  [facility]  [address] [place], [place] [address]    Center: [phone]  Voicemail: [phone]  [email or web address]

## 2022-11-21 NOTE — Unmapped (Signed)
Patient [facility] Cancer Support Encounter    Met with patient in the Patient & [facility]. Provided active listening and emotional support as patient discussed current needs and challenges.    Mr. Kopka had issues with MyChart application. Assisted with redownloading MyChart on phone and helped with navigating the application. Reviewed upcoming rides and appointments. Printed appointments out for Mr. Fawcett, as he does not have wifi at home. Encouraged Mr. Masten to use his After Visit Summaries for upcoming appointments. Plans to arrange rides on own for November appointments.     [name], [name], RN, [facility]   She/Her/Hers  Oncology Nurse Navigator - [name] Patient [facility]  [facility]  [address], [place], [place] [address]    Center: [phone]  Voicemail: [phone]  [email or web address]

## 2022-11-22 NOTE — Unmapped (Signed)
Patient [facility] Cancer Support Encounter    Mr. Thiam called the Patient & [facility]. Provided active listening and emotional support as patient discussed current needs and challenges.    At DSS and asked me to speak with worker to discuss form needed for primary care provider. DSS worker providing Mr. Baerga form and he can bring to his appointment on 10/31.      [name], [name], RN, [facility]   She/Her/Hers  Oncology Nurse Navigator - [name] Patient [facility]  [facility]  [address] [place], [place] [address]    Center: [phone]  Voicemail: [phone]  Katelinn [email or web address]

## 2022-11-24 NOTE — Unmapped (Signed)
Provided follow-up counseling to Paul Hines, 52 y.o. for treatment of tobacco use/dependence.     SUMMARY: SW re-assessed pt's tobacco use status and followed up about treatment plan. Pt endorsed current daily tobacco use of 2 cpd. Pt expressed continued interest and motivation in tobacco cessation. Pt stated he was able to pick up the nicotine gum and has been having success with it. Pt has lowered his use since starting the gum. SW and pt reviewed the proper use of the nicotine gum and potential side effects. SW and pt discussed behavioral strategies pt could adopt to help mitigate his urges and reduce his tobacco use (listed below). SW and pt reviewed psychoeducation regarding behavioral techniques that can be used in tobacco cessation. Pt feels confident in his cessation attempt and he will continue to work towards being fully quit. SW made plans to follow up with pt. Pt has SW's number if additional needs arise.     Tobacco Use Treatment  Program: [facility]  Type of Visit: Follow-up  Session Number: 5  Tobacco Use Treatment Visit: Talked with patient  Permission To Engage In Conversation Re: PHI w/ Visitors Present: n/a  Goals Of Session: Insight, increase, Assessment, Communication of feelings, Self-esteem, improve, Stress management, increase, Behavior management, improve    Cancer Center Patients  Primary Service: Other (TA)  Primary Cancer Type: Head and Neck    Tobacco Use During Past 30 Days  Time Since Last Tobacco Use: smoked a cigarette today (at least one puff)  Tobacco Withdrawal (Past 24 Hours): None noted  Type of Tobacco Products Used: Cigarettes  Quantity Used: 2  Quantity Per: day  Relight Cigarettes: Yes  Time to First Use After Waking: >60 minutes  Wake During Sleep to Smoke: Never  Other Household Members Use Tobacco: Yes  Smoking Allowed in Home: Yes  Smoking Allowed in Vehicles: N/A  Smoke During Work Day: N/A    Tobacco Use History  Most Recent Attempt: Currently working on  Medications Used in Past Attempts: None    Behavioral Assessment  Why Uses: 1. Long Standing Habit 2. Stress 3. Those around him smoke  Reasons to Become Tobacco Free: 1. Health  Barriers/Challenges: 1. Habit 2. Stress  Strategies: 1. Nicotine patch and gum 2. Mindfulness 3. Behavioral Friction    Treatment Plan  Cessation Meds Currently Using: Gum 4mg , Patch 21mg   Outpatient/Discharge Medications Recommended: Gum 4mg , Patch 21mg   Plan to Obtain Outpatient Meds: Pt already has medication  Patient's Plan Post Discharge/Visit: Plan to quit as soon as possible  Follow-up Plan: Permission given, Phone follow-up scheduled  Family Members Included in Intervention/Plan: No    TTS Information  Diagnosis: Tobacco use disorder, unspecified, uncomplicated (F17.200)  Interventions: Assessed, Discussed, Informed, Motivational interviewing, Taught, Suggested, Cognitive restructuring, Tx plan development, Encouraged  TTS Visit Length: 3-10 minutes    Devynn Scheff Valentina Lucks, LCSW-A  Tobacco Treatment Counselor  [facility] Tobacco Treatment Program  [phone]

## 2022-11-24 NOTE — Unmapped (Signed)
This encounter was created in error - please disregard.

## 2022-11-24 NOTE — Unmapped (Unsigned)
Subjective     Reason for visit:    Paul Hines is a 52 y.o. male with a history of diabetes (type 2), who presents today for a diabetes pharmacotherapy visit.  Patient presents to this visit alone.    Known DM Complications: no known complications    Date of Last Diabetes Related Visit: 10/19/22 with PCP    Action At Last Diabetes Related Visit:    Patient reported discrepancy in how he is taking medications vs. What has been prescribed and has been without DM supplies that prevents him from using insulin as prescribed. No medication changes were made at PCP visit, instead seeking to understand patient's current home regimen.     Since Last visit / History of Present Illness:    Patient reports fully implementing plan from last visit. Patient brings pill bottles to visit today, but states he isn't sure if he has more at home. He did not bring insulin today (in fridge). Together, we reviewed all medications that he brought with him to appointment. Today's visit primarily focused on reviewing each medication that patient brough in today.     Reported DM Regimen:    Metformin 500 mg 2 tablets in the morning  Lantus 15 units subcutaneous in the morning (8-10 am)     DM medications tried in the past:   N/a    Medication Adherence and Access:  Since last visit, patient denies missing doses of medications, though there are some gaps in fill history. Patient is prescribed carvedilol 6.25 mg BID and spironolactone 25 mg daily though does not have these bottles present today and states that he doesn't have any other bottles at home.      SMBG    :    Patient is not checking BG at this time. He states he needs a new glucometer. Interested in CGM.       Hypoglycemia:    Symptoms of hypoglycemia since last visit:  no  If yes, it was treated by: n/a      DM Prevention:  Statin:  unclear if taking, prescribed atorvastatin 80 mg ; high intensity.   Aspirin - unclear if indicated; Not taking    ACEI/ARB -  unclear if taking, prescribed losartan-hydrochlorothiazide ; Urine MA/CR Ratio - elevated urinary albumin excretion.  Last eye exam: no documentation; due now  Last foot exam: 10/19/22   Tobacco Use: Current smoker  Immunizations:   Immunization History   Administered Date(s) Administered    COVID-19 VAC,BIVALENT,MODERNA(BLUE CAP) 11/09/2020    COVID-19 VACCINE,MRNA(MODERNA)(PF) 03/06/2019, 04/03/2019, 12/18/2019, 06/08/2020    Covid-19 Vac, (25yr+) (Comirnaty) [employer]  10/19/2022    DTaP, Unspecified Formulation 12/13/1971, 02/09/1972, 04/26/1972, 10/03/1973    INFLUENZA VACCINE IIV3(IM)(PF)6 MOS UP 10/19/2022    Influenza Vaccine Quad(IM)6 MO-Adult(PF) 12/02/2020    Influenza Virus Vaccine, unspecified formulation 11/03/2020    Measles 12/13/1971    Mumps 03/28/1974    Pneumococcal Conjugate 20-valent 11/03/2022    Polio Virus Vaccine, Unspecified Formulation 12/13/1971, 02/09/1972, 04/26/1972, 10/03/1973    Rubella 12/13/1971    SHINGRIX-ZOSTER VACCINE (HZV),RECOMBINANT,ADJUVANTED(IM) 11/03/2022    TdaP 11/03/2022     _________________________________________________    Past Medical History: reviewed PMH in epic today    Social History:  Social History     Tobacco Use   Smoking Status Some Days    Types: Cigarettes    Passive exposure: Current   Smokeless Tobacco Never       Medications: Medications reviewed in EPIC medication station and  updated today by the clinical pharmacist practitioner.         Objective   Vitals:  There were no vitals filed for this visit.    Wt Readings from Last 3 Encounters:   11/03/22 75.5 kg (166 lb 6.4 oz)   11/03/22 74.2 kg (163 lb 9.6 oz)   10/19/22 75 kg (165 lb 6.4 oz)       There is no height or weight on file to calculate BMI.    The 10-year ASCVD risk score (Arnett DK, et al., 2019) is: 30%    Values used to calculate the score:      Age: 30 years      Sex: Male      Is Non-Hispanic African American: Yes      Diabetic: Yes      Tobacco smoker: Yes      Systolic Blood Pressure: 128 mmHg Is BP treated: Yes      HDL Cholesterol: 71 mg/dL      Total Cholesterol: 261 mg/dL    Note: For patients with SBP <90 or >200, Total Cholesterol <130 or >320, HDL <20 or >100 which are outside of the allowable range, the calculator will use these upper or lower values to calculate the patient???s risk score.      Labs:   Lab Results   Component Value Date    A1C >14.0 (H) 10/19/2022    A1C 11.4 (H) 06/20/2022    A1C 9.8 (H) 02/01/2022         Assessment/Plan:    1. Diabetes, type 2: uncontrolled per last A1c of >14.0%. Goal <7.0% per ADA guidelines. Per patient, he is taking 2 tablets of metformin 500 mg daily in the morning. He is taking Lantus 15 units subcutaneous in the morning. He does not check his BG because he does not have glucometer. Will send in new prescriptions for glucometer, lancets, strips today. We discussed the importance of controlling BG to prevent microvascular and macrovascular complications of uncontrolled T2DM. Discussed that reaching optimal BG ranges will likely take a number of months and will require frequent discussion with pharmacist and PCP. With once daily insulin injection, patient will qualify for continuous glucose monitor (CGM).   Provided education on Dexcom G7 and [employer] 3. Patient would like to use a reciever, not an app on phone. Will send in prescriptions for Dexcom today (patient selected due to application device) and complete required prior auth. Next visit will focus solely on setting up Dexcom. Unable to safely adjust insulin today given no BG data though suspect patient requires more than 15 units daily given reported symptoms of frequent urination, thirst, and fatigue. Encouraged patient to increase metformin dose to 2 tablets BID as prescribed. Unknown other medications patient has trialed. GLP1 agonists not good option given thyroid cancer. SGLT2 not preferred until A1c <10% given risk for urogenital infection, though would aid microalbuminuria.   Continue Lantus 15 units daily   Try to take around the same time each day  Continue metformin 500 mg 2 tablets in the morning. Increase to metformin 2 tablets BID after taking 2 tablets daily for 7 days continuously  Repeat A1c due 01/18/23.  Reviewed symptoms and treatment of hypoglycemia, including need to check BG prior to and following treatment.  SMBG instructions:  once daily fasting and HS      #CGM: Provided education on how to apply and set up {Blank multiple:19197::Freestyle Libre 3,Dexcom G7,***} CGM as detailed below. Observed pt apply  CGM to {Blank multiple:19197::back of upper arm,abdomen,upper buttocks,***}. Pt will be utilizing {Blank multiple:19197::smart phone,receiver,smart phone and receiver.***} to view blood glucose and set up alarms. Reviewed the following educational points with pt. Pt repeated educational points aloud.   Select site for CGM at least 3 inches from insulin injection site. Ensure area is clean and free of any lotions, perfumes, medications to help the adhesive stick. Do not use the same site for 2 sensors in a row. Avoid areas where your sensor may be frequently rubbed.   Check BG with finger stick with any CGM readings <70 or with any symptoms of high or low blood sugar.   Assisted pt with setting low BG (<***mg/dL) and high BG (>*** mg/dL) alerts on {Blank ZOXWRUEA:54098::JXBJY phone,receiver}.   Remove CGM prior to MRI, CT scans, diathermy treatment  The following information specific to Freestyle Libre CGM was also communicated:   During the first 12 hours of sensor wear, sensor glucose readings should not be used to make treatment decisions. Confirm sensor glucose with fingerstick blood glucose prior during this time.   Taking more than 500 mg of ascorbic acid (vitamin C) per day may falsely elevate blood glucose readings  There is a one-hour warm-up period after sensor placement  The following information specific to Dexcom G7 CGM was also communicated: You may calibrate CGM readings by selecting Menu > Calibration. You may choose to calibrate when your symptoms don't match your G7 readings. Wash and dry hands, check BG with fingerstick, then enter exact meter readings to align sensor readings with glucometer readings.   Hydroxyurea falsely elevates Dexcom G7 readings  Taking >1 gram of acetaminophen Q6H may falsely elevate Dexcom G7 readings.  There is a 30 minute warm-up period after sensor placement    Freestyle Libre- Reader/App Setup    Start your sensor with the Reader if you would like to use the Reader to monitor BG. If you also want to use the App, scan the sensor with the app after starting it with the reader.     Reader:  Press home button to turn on  [employer] the Reader within 1.5 in of sensor to scan it and start it.   From the home screen select Settings symbol > Alarms. Touch Change Alarm Settings to customize low and high glucose alarms.   Reader will automatically display your glucose reading after 60 minutes.     Smart device:   Tap the scan button  Hold the top of your phone (iPhone) or back of phone (Android) near the sensor. Hold still until you hear a tone or feel a vibration. This will start the sensor.   Go to the main menu to access app settings. Set Target Glucose Range. Adjust alarms and reminders as desired.     Freestyle Libre- Sensor Application                  Dexcom G6- Receiver/App Setup    You may select one smart device to set up a new sensor on. You may choose to use both a smart device and the Dexcom receiver to monitor BG.     Smart device settings: bluetooth ON, notifications ON, Dexcom app OPEN,     Download App  Create username and password  Review and set alarms/alerts as prompted  Allow to receive alarm/alerts   Enter transmitter SN (serial number). Either take a photo of the 2D barcode on the transmitter box or manually enter  6-digit SN.   Enter sensor code located on sensor applicator adhesive label by taking a picture of the barcode or manually entering. If you do not enter sensor code, will be asked to calibrate on a daily basis. If you enter sensor code on the app, the receiver will automatically join current session (no need to re-enter).   Insert sensor and attach transmitter. Transmitter will automatically pair with display device.   Tap Start Sensor to start 30 minute warmup period    Receiver: hold power button to turn on    Enter date and time. Set low and high alert levels.   Enter sensor code (on sensor applicator adhesive label)  TEFL teacher SN (on bottom of transmitter and its box)  Insert sensor and attach transmitter. Transmitter will automatically pair.     Dexcom G7- Sensor Application                                2. Hypertension: uncontrolled DBP based on clinic BP of 128/85 mmHg. Goal <130/80 mmHg per ADA guidelines. Per patient report and medication bottles today, he is taking amlodipine 10 mg and losartan-hydrochlorothiazide 100-25 mg daily and is not taking spironolactone 25 mg or carvedilol 6.25 mg BID.  Continue amlodipine 10 mg daily and losartan-hydrochlorothiazide 100-25 mg daily    3. Future visit: address lipid panel, microalbuminuria, DM health maintenance      Follow-up: Return to [facility] in 2 weeks for pharmacotherapy clinic and 10/31 to see PCP    Future Appointments   Date Time Provider Department Center   11/27/2022  9:00 AM Dwain Sarna, CPP [facility]   11/30/2022  9:00 AM Vernelle Emerald, DO [facility]   12/07/2022  2:30 PM Aumer, Carollee Herter, CCC-SLP [facility]   12/07/2022  2:30 PM [facility] RM 7 IFLUOUW [place]   12/11/2022 10:30 AM Ria Bush, MD [facility]   12/20/2022 12:40 PM IC CT RM 2 ICTRLGH [place] - IC   12/20/2022  1:45 PM ADULT ONC LAB [facility]   12/20/2022  2:40 PM Sheth, Siddharth Hemant, DO SURONC [facility]   01/02/2023 11:00 AM Gabriel Carina, MD DERMMRKT [facility] 02/20/2023  9:00 AM Elsie Stain, MD DERMMR[facility]       I spent a total of 45 minutes face to face with the patient delivering clinical care and providing education/counseling.    _________________________________________________      Karalee Height, PharmD CPP  [facility] Clinical Pharmacist

## 2022-11-27 ENCOUNTER — Ambulatory Visit: Admit: 2022-11-27 | Payer: PRIVATE HEALTH INSURANCE

## 2022-11-29 NOTE — Unmapped (Signed)
Patient [facility] Cancer Support Encounter    [name] is scheduled for [facility] on Tuesday and Thursdays at noon. Called [name] to update him on schedule. [name] educated in person on how to arrange rides for upcoming appointments and [facility]. Reports that he is able to schedule rides on own. Did update [name] that Joyice Faster sent him an email with the dates that [facility] will be closed:    12/14/22   12/28/22   01/11/23   01/23/23   01/25/23   01/30/23   02/01/23   02/08/23   03/15/23     [name] needed to refill his synthroid. Connected to outpatient pharmacy.     Paul Hines, [name], RN, [facility]   She/Her/Hers  Oncology Nurse Navigator - [name] Patient [facility]  [facility]  [address] [place], [place] [address]    Center: [phone]  Voicemail: [phone]  [email or web address]

## 2022-11-30 ENCOUNTER — Ambulatory Visit: Admit: 2022-11-30 | Discharge: 2022-12-01 | Payer: PRIVATE HEALTH INSURANCE

## 2022-11-30 MED ORDER — CETIRIZINE 10 MG TABLET
ORAL_TABLET | Freq: Every day | ORAL | 2 refills | 30 days | Status: CP
Start: 2022-11-30 — End: 2023-11-30
  Filled 2022-11-30: qty 30, 30d supply, fill #0

## 2022-11-30 NOTE — Unmapped (Signed)
Immediately after or during the visit, I reviewed with the resident the medical history and the resident???s findings on physical examination.  I discussed with the resident the patient???s diagnosis and concur with the treatment plan as documented in the resident note. Ailene Ravel, MD

## 2022-11-30 NOTE — Unmapped (Signed)
[facility]  Established Patient Clinic Note    Assessment/Plan:   PaulPaul Hines is a 52 y.o.male      1. Type 2 diabetes mellitus without complication, with long-term current use of insulin (CMS-HCC)  Poorly controlled diabetes, most recent A1c >14.0 (10/19/2022).  This is up from 11.4 in May 2024.  There has been confusion surrounding his medication regimen over the last several visits.  Today, he was able to bring all medications and for full review.  Additionally, he was able to meet with Pharm.D. who submitted a prior Auth for Dexcom.  I agree this will help with management of daily glucoses.  I again stressed the importance of medication adherence and better control of his glucose.  He seems more motivated today and will continue to work on glucose.  Repeat A1c December 2024. Due to thyroid cancer he is not a good candiate for GLP1. Will plan to add a SGLT2 once A1C is <10.    Will plan to follow up in 3-4 weeks. Continue to follow with pharmD.     Current Medications:    --Metformin 500mg  2 tabs BID -- taking 2 in the morning but none at night  --Lantus 15u daily -- per patient varies between 15-30u. Has not been taking consistently   --Atorvastatin 80 mg daily -  --amlodipine 10mg    --losartan- hydrochlorothiazide 100/25     Medication changes today:   --encouraged him to take metformin twice daily   --increase lantus to 20u daily. Encouraged him to do this consistently     DMII prev med:   --Foot exam - 10/19/2022 UTD- deficits on big toes bilaterally. No major callous or ulcers. Skin intact  --Eyes- DR screening referral placed on 10/19/2022  --labs -   --A1C >14 on 10/19/2022; repeat 01/18/2023  --Microalb/Cr (10/19/2022): 33.2; on ARB  --BP (goal <130/80)-  above goal, see below for discussion  --ASCVD risk (q45yrs if well controlled): elevated. On statin, unsure compliance       2. Essential hypertension  Patient with long standing HTN.  BPs have varied but largely uncontrolled.  I feel some of this is due to poor compliance.  We had a good discussion regarding medication adherence and recommended he continue amlodipine 10 mg, and losartan/hydrochlorothiazide 100/25; Per pharmcy note, he was not taking spironolactone but today, he states he is. BP was not at goal today despite him taking medications this morning.  Per patient, he was previously on clonidine while in prison.  This could be an option if he still is poorly controlled however, I have reservations given his poor compliance and rebound hypertension if we start this.  Additionally, he was on carvedilol in the past but no longer taking.  This may be a better option given compliance issues.      Regimen prescribed:   --amlodipine 10mg  -  --losartan- hydrochlorothiazide 100/25  --spironolactone 25mg  daily     Previous medications no longer taking:  --Carvedilol 6.5mg  BID  -- consider restarting if he remains elevated.        3. Thyroid cancer (CMS-HCC)  Followed by [facility].  While treatment is palliative, his overall life expectancy seems fairly good if compliant on medications (Lenvima). He has planned repeat scans in 4 weeks.   --continue to follow with surgery team  --repeat scans in 4 weeks per surgery team  --will place pain management referral today (11/30/2022) due to ongoing surgical site pain.  Vernelle Emerald, DO  Sports Medicine Fellow   [facility]         Attending: Dr. Manson Passey    Subjective   [name] is a 52 y.o. male  coming to clinic today for the following issues:    Chief Complaint   Patient presents with    Follow-up    Medication Refill     HPI:    Paul Hines is a 52 year old male with history of metastatic thyroid cancer s/p resection by Dr. Azucena Fallen on 04/12/2020 and neck dissection on 07/28/2020, poorly controlled type 2 diabetes, hypertension, and ischemic cardiac disease status post cath in 2021.      # Diabetes: not controlled. Pt brought his medications in today which were reviewed. Missed last pharmD follow up but did see them a few weeks ago. Dexcom prior auth submitted.     # Metastatic papillary thyroid cancer: Followed by [facility] and neck oncology clinic. Treatment goals are palliative.   - Tg: 75 (02/17/2020) --> 43 (06/24/2020) --> 212 (01/04/2021) --> 323 (02/01/21) ---> 502 (06/15/21) --> 662 (10/05/2021) --> 70 (02/01/2022) --> 107 (03/15/2022)  - Currently on Lenvima (02/21/21- current); notable for multiple missed doses  - CT Neck/Chest (03/15/2022): stable disease  - Continue Lenvima 18 mg daily    # Hypertension: Still not controlled.        Medications: spironolactone 25         I have reviewed the problem list, medications, and allergies and have updated/reconciled them if needed.    Paul Hines  reports that he has been smoking cigarettes. He has been exposed to tobacco smoke. He has never used smokeless tobacco.  Health Maintenance   Topic Date Due    Retinal Eye Exam  Never done    Colon Cancer Screening  Never done    Zoster Vaccines (2 of 2) 12/29/2022    Hemoglobin A1c  01/18/2023    Foot Exam  10/19/2023    Urine Albumin/Creatinine Ratio  10/19/2023    Serum Creatinine Monitoring  11/03/2023    Potassium Monitoring  11/03/2023    DTaP/Tdap/Td Vaccines (6 - Td or Tdap) 11/02/2032    Pneumococcal Vaccine 0-64  Completed    Hepatitis C Screen  Completed    COVID-19 Vaccine  Completed    Influenza Vaccine  Completed       Objective     VITALS: BP 144/101  - Pulse 85  - Ht 170.2 cm (5' 7)  - Wt 76.3 kg (168 lb 3.2 oz)  - BMI 26.34 kg/m??     Physical Exam  Constitutional:       General: He is not in acute distress.     Appearance: Normal appearance. He is normal weight. He is not toxic-appearing.   HENT:      Head: Normocephalic and atraumatic.      Nose: Nose normal.   Eyes:      Extraocular Movements: Extraocular movements intact.      Conjunctiva/sclera: Conjunctivae normal.   Cardiovascular:      Rate and Rhythm: Normal rate.   Pulmonary:      Effort: Pulmonary effort is normal. Musculoskeletal:      Cervical back: Normal range of motion and neck supple.   Skin:     General: Skin is warm.   Neurological:      Mental Status: He is alert and oriented to person, place, and time. Mental status is at baseline.  LABS/IMAGING  No visits with results within 3 Day(s) from this visit.   Latest known visit with results is:   Lab on 11/03/2022   Component Date Value    Sodium 11/03/2022 134 (L)     Potassium 11/03/2022 4.3     Chloride 11/03/2022 99     CO2 11/03/2022 29.0     Anion Gap 11/03/2022 6     BUN 11/03/2022 18     Creatinine 11/03/2022 0.86     BUN/Creatinine Ratio 11/03/2022 21     eGFR CKD-EPI (2021) Male 11/03/2022 >90     Glucose 11/03/2022 538 (HH)     Calcium 11/03/2022 9.1     Albumin 11/03/2022 3.0 (L)     Total Protein 11/03/2022 6.7     Total Bilirubin 11/03/2022 0.2 (L)     AST 11/03/2022 12     ALT 11/03/2022 <7 (L)     Alkaline Phosphatase 11/03/2022 80     THYROGLOBULIN AB 11/03/2022 <1.8     Free T4 11/03/2022 1.35     TSH 11/03/2022 0.016 (L)     WBC 11/03/2022 6.9     RBC 11/03/2022 5.33     HGB 11/03/2022 12.5 (L)     HCT 11/03/2022 38.6 (L)     MCV 11/03/2022 72.4 (L)     MCH 11/03/2022 23.4 (L)     MCHC 11/03/2022 32.4     RDW 11/03/2022 14.1     MPV 11/03/2022 9.0     Platelet 11/03/2022 240     Neutrophils % 11/03/2022 68.0     Lymphocytes % 11/03/2022 22.5     Monocytes % 11/03/2022 7.2     Eosinophils % 11/03/2022 1.7     Basophils % 11/03/2022 0.6     Absolute Neutrophils 11/03/2022 4.7     Absolute Lymphocytes 11/03/2022 1.6     Absolute Monocytes 11/03/2022 0.5     Absolute Eosinophils 11/03/2022 0.1     Absolute Basophils 11/03/2022 0.0     Microcytosis 11/03/2022 Slight (A)     Hypochromasia 11/03/2022 Marked (A)     Smear Review Comments 11/03/2022 See Comment (A)     Thyroglobulin, Tumor Mar* 11/03/2022 39 (H)     Thyroglubulin, IA, Interp 11/03/2022 SEE COMMENTS        [facility]  [place] [facility] [place] [facility]  [address] [place], [place] [address]  Telephone [phone]  Fax [phone]  [email or web address]

## 2022-11-30 NOTE — Unmapped (Addendum)
Medication changes:   -Increase lantus to 20u per day for diabetes   -take metformin 2 tablets in the morning and at night for diabetes   -take spironolactone 25mg  daily for blood pressure   -if possible, try to take your blood pressure   -keep apt with pharmacist to review medications.     Follow up with me in 2-3 weeks.

## 2022-12-10 NOTE — Unmapped (Incomplete)
Subjective     Reason for visit:    Paul Hines is a 52 y.o. male with a history of diabetes (type 2), who presents today for a diabetes pharmacotherapy visit.  Patient presents to this visit alone.    Known DM Complications:  mircoalbuminuria (UACR 10/19/22 33.2 mg/g)    Date of Last Diabetes Related Visit: 11/30/22 with PCP    Action At Last Diabetes Related Visit:    Increase insulin glargine (Basaglar) to 20 units daily  Encouraged patient to take metformin 500 mg - 2 tabs BID (not once daily)    Since Last visit / History of Present Illness:    Patient reports *** plan from last visit.    Pre-charting  - PMH: Metastatic papillary thyroid cancer s/p resection and neck dissection, HTN, ischemic heart disease s/p cath in 2021  - Insurance: [place] Medicaid  - Last visit: 11/13/22 with CPP, 11/30/22 PCP   - Med changes: Increase Lantus to 20 units, increase metformin to 1000 mg BID  - Current DM regimen: Metformin 1000 mg BID, Lantus 20 units  - Current BP regimen: losartan/hctz 100/25, amlo 10, spiro; prev on Coreg 25  - Preventative meds:   - ACE/ARB: losartan   - Statin: atorva 80   - Aspirin: unclear, indicated  - Labs:   - Last A1c >14% (10/19/22)   - BMP: K+ 4.3, Cr0.86   - Lipid panel: LDL 145 (10/19/22), ASCVD >20% (smoker, elevated BP, LDL), goal <70   - Cath: 07/2019 Non-obstructive CAD with 40% mid-LAD stenosis, 30% OM1, 30% mid LCx   - MACR: 33.2 (10/19/22)   - Clinic BP readings: 10/31 144/101  - Other notes:  - Future considerations:    Questions for today:  - Med adherence? Increase to Basaglar 20 units? Increased metformin?  - Need to refill Basaglar - likely expired  - CGM ordered/received? Checking BG?  Plan: Increase Basaglar to 24 units, review CGM instructions (if has on hand), consider addition of TZD or DPP-4i    - BP regimen?   Spiro 25 LFD 08/02/22 90ds   Amlo 10 LFD 10/04/22 90ds   Losartan/hydrochlorothiazide 100/25  11/30/22 90ds   Coreg 7/924 90 ds  - Checking BP?  - Adherence?   Plan: Adherence, recheck BMP, unclear when pt resumed spiro, consider titrating spiro or adding Coreg    CAD  -adherence to atorva?  -tobacco use? Contacted by social work re cessation  Plan: review ASCVD risk, consider adding Zetia      Reported DM Regimen:    Metformin 500 mg 2 tablets BID - ***  Lantus 20 units subcutaneous in the morning - ***    DM medications tried in the past:   N/a    Reported HTN Regimen:  Amlodipine 10 mg daily  Losartan-hydrochlorothiazide 100-25 mg  Spironolactone 25 mg daily    Past HTN medications tried in the past:  Carvedilol 25 mg BID  Clonidine     Medication Adherence and Access:  Since last visit, patient denies missing doses of medications,    SMBG    :    Patient is *** checking BG at this time. He has *** not filled CGM yet.      Hypoglycemia:    Symptoms of hypoglycemia since last visit:  no  If yes, it was treated by: n/a      DM Prevention:  Statin: Taking; high intensity. Atorva 80 mg  Aspirin - indicated (primary prevention), non-obstructive CAD per 2021 LHC;  Not taking    ACEI/ARB -  unclear if taking, prescribed losartan-hydrochlorothiazide ; Urine MA/CR Ratio - elevated urinary albumin excretion.  Last eye exam: no documentation; due now  Last foot exam: 10/19/22   Tobacco Use: Current smoker  Immunizations:   Immunization History   Administered Date(s) Administered    COVID-19 VAC,BIVALENT,MODERNA(BLUE CAP) 11/09/2020    COVID-19 VACCINE,MRNA(MODERNA)(PF) 03/06/2019, 04/03/2019, 12/18/2019, 06/08/2020    Covid-19 Vac, (65yr+) (Comirnaty) [employer]  10/19/2022    DTaP, Unspecified Formulation 12/13/1971, 02/09/1972, 04/26/1972, 10/03/1973    INFLUENZA VACCINE IIV3(IM)(PF)6 MOS UP 10/19/2022    Influenza Vaccine Quad(IM)6 MO-Adult(PF) 12/02/2020    Influenza Virus Vaccine, unspecified formulation 11/03/2020    Measles 12/13/1971    Mumps 03/28/1974    Pneumococcal Conjugate 20-valent 11/03/2022    Polio Virus Vaccine, Unspecified Formulation 12/13/1971, 02/09/1972, 04/26/1972, 10/03/1973    Rubella 12/13/1971    SHINGRIX-ZOSTER VACCINE (HZV),RECOMBINANT,ADJUVANTED(IM) 11/03/2022    TdaP 11/03/2022     _________________________________________________    Past Medical History: reviewed PMH in epic today    Social History:  Social History     Tobacco Use   Smoking Status Some Days    Types: Cigarettes    Passive exposure: Current   Smokeless Tobacco Never       Medications: Medications reviewed in EPIC medication station and updated today by the clinical pharmacist practitioner.         Objective   Vitals:  There were no vitals filed for this visit.    Wt Readings from Last 3 Encounters:   11/30/22 76.3 kg (168 lb 3.2 oz)   11/03/22 75.5 kg (166 lb 6.4 oz)   11/03/22 74.2 kg (163 lb 9.6 oz)       There is no height or weight on file to calculate BMI.    The 10-year ASCVD risk score (Arnett DK, et al., 2019) is: 36%    Values used to calculate the score:      Age: 28 years      Sex: Male      Is Non-Hispanic African American: Yes      Diabetic: Yes      Tobacco smoker: Yes      Systolic Blood Pressure: 144 mmHg      Is BP treated: Yes      HDL Cholesterol: 71 mg/dL      Total Cholesterol: 261 mg/dL    Note: For patients with SBP <90 or >200, Total Cholesterol <130 or >320, HDL <20 or >100 which are outside of the allowable range, the calculator will use these upper or lower values to calculate the patient???s risk score.      Labs:   Lab Results   Component Value Date    A1C >14.0 (H) 10/19/2022    A1C 11.4 (H) 06/20/2022    A1C 9.8 (H) 02/01/2022         Assessment/Plan:    1. Diabetes, type 2: uncontrolled per last A1c of >14.0%. Goal <7.0% per ADA guidelines.   Increase Basaglar to *** 24 units daily   Refill Basaglar, home supply is likely expired  Continue metformin 500 mg 2 tablets BID  *** Start CGM  Repeat A1c due 01/18/23.  Reviewed symptoms and treatment of hypoglycemia, including need to check BG prior to and following treatment.  SMBG instructions:  once daily fasting and HS      #CGM: Provided education on how to apply and set up {Blank multiple:19197::Freestyle Libre 3,Dexcom G7,***} CGM as detailed below. Observed  pt apply CGM to {Blank multiple:19197::back of upper arm,abdomen,upper buttocks,***}. Pt will be utilizing {Blank multiple:19197::smart phone,receiver,smart phone and receiver.***} to view blood glucose and set up alarms. Reviewed the following educational points with pt. Pt repeated educational points aloud.   Select site for CGM at least 3 inches from insulin injection site. Ensure area is clean and free of any lotions, perfumes, medications to help the adhesive stick. Do not use the same site for 2 sensors in a row. Avoid areas where your sensor may be frequently rubbed.   Check BG with finger stick with any CGM readings <70 or with any symptoms of high or low blood sugar.   Assisted pt with setting low BG (<***mg/dL) and high BG (>*** mg/dL) alerts on {Blank ZOXWRUEA:54098::JXBJY phone,receiver}.   Remove CGM prior to MRI, CT scans, diathermy treatment  The following information specific to Freestyle Libre CGM was also communicated:   During the first 12 hours of sensor wear, sensor glucose readings should not be used to make treatment decisions. Confirm sensor glucose with fingerstick blood glucose prior during this time.   Taking more than 500 mg of ascorbic acid (vitamin C) per day may falsely elevate blood glucose readings  There is a one-hour warm-up period after sensor placement  The following information specific to Dexcom G7 CGM was also communicated:   You may calibrate CGM readings by selecting Menu > Calibration. You may choose to calibrate when your symptoms don't match your G7 readings. Wash and dry hands, check BG with fingerstick, then enter exact meter readings to align sensor readings with glucometer readings.   Hydroxyurea falsely elevates Dexcom G7 readings  Taking >1 gram of acetaminophen Q6H may falsely elevate Dexcom G7 readings.  There is a 30 minute warm-up period after sensor placement      2. Hypertension: uncontrolled DBP based on clinic BP of ***  mmHg. Goal <130/80 mmHg per ADA guidelines. Per patient report and medication bottles today, he is taking amlodipine 10 mg and losartan-hydrochlorothiazide 100-25 mg daily and is not taking spironolactone 25 mg or carvedilol 6.25 mg BID.  Continue amlodipine 10 mg daily and losartan-hydrochlorothiazide 100-25 mg daily  *** Increase spironolactone to 50 mg daily    3. Future visit: address lipid panel, DM health maintenance    Follow-up: Return to [facility] in *** weeks for pharmacotherapy clinic and *** to see PCP    Future Appointments   Date Time Provider Department Center   12/11/2022 10:30 AM Ria Bush, MD [facility]   12/11/2022  1:00 PM Dwain Sarna, CPP [facility]   12/14/2022  9:00 AM Vernelle Emerald, DO [facility]   12/20/2022 12:40 PM IC CT RM 2 [facility] [address] - [facility]   12/20/2022  1:45 PM ADULT ONC LAB [facility]   12/20/2022  2:40 PM Sheth, Siddharth Hemant, DO [facility]   01/02/2023 11:00 AM Gabriel Carina, MD [facility]   02/20/2023  9:00 AM Elsie Stain, MD [facility]       I spent a total of 45 minutes face to face with the patient delivering clinical care and providing education/counseling.    _________________________________________________

## 2022-12-11 ENCOUNTER — Ambulatory Visit: Admit: 2022-12-11 | Payer: PRIVATE HEALTH INSURANCE

## 2022-12-14 ENCOUNTER — Ambulatory Visit: Admit: 2022-12-14 | Payer: PRIVATE HEALTH INSURANCE

## 2022-12-14 NOTE — Unmapped (Unsigned)
[facility]  Established Patient Clinic Note    Assessment/Plan:   Paul Hines is a 52 y.o.male      1. Type 2 diabetes mellitus without complication, with long-term current use of insulin (CMS-HCC)  Poorly controlled diabetes, most recent A1c >14.0 (10/19/2022).  This is up from 11.4 in May 2024.  Last seen by me on 11/30/2022.   Additionally, he was able to meet with Pharm.D. who submitted a prior Auth for Dexcom.  I agree this will help with management of daily glucoses.  I again stressed the /importance of medication adherence and better control of his glucose.  He seems more motivated today and will continue to work on glucose.  Repeat A1c December 2024. Due to thyroid cancer he is not a good candiate for GLP1. Will plan to add a SGLT2 once A1C is <10.    Will plan to follow up in  weeks. Continue to follow with pharmD.     Current Medications:    --Metformin 500mg  2 tabs BID -- taking 2 in the morning but none at night  --Lantus 20u daily -- per patient varies between 15-30u. Has not been taking consistently   --Atorvastatin 80 mg daily -  --amlodipine 10mg    --losartan- hydrochlorothiazide 100/25     Medication changes today:   --encouraged him to take metformin twice daily   --increase lantus to 20u daily. Encouraged him to do this consistently     DMII prev med:   --Foot exam - 10/19/2022 UTD- deficits on big toes bilaterally. No major callous or ulcers. Skin intact  --Eyes- DR screening referral placed on 10/19/2022  --labs -   --A1C >14 on 10/19/2022; repeat 01/18/2023  --Microalb/Cr (10/19/2022): 33.2; on ARB  --BP (goal <130/80)-  above goal, see below for discussion  --ASCVD risk (q70yrs if well controlled): elevated. On statin, unsure compliance       2. Essential hypertension  Patient with long standing HTN.  BPs have varied but largely uncontrolled.  I feel some of this is due to poor compliance.  We had a good discussion regarding medication adherence and recommended he continue amlodipine 10 mg, and losartan/hydrochlorothiazide 100/25; Per pharmcy note, he was not taking spironolactone but today, he states he is. BP was not at goal today despite him taking medications this morning.  Per patient, he was previously on clonidine while in prison.  This could be an option if he still is poorly controlled however, I have reservations given his poor compliance and rebound hypertension if we start this.  Additionally, he was on carvedilol in the past but no longer taking.  This may be a better option given compliance issues.      Regimen prescribed:   --amlodipine 10mg  -  --losartan- hydrochlorothiazide 100/25  --spironolactone 25mg  daily     Previous medications no longer taking:  --Carvedilol 6.5mg  BID  -- consider restarting if he remains elevated.        3. Thyroid cancer (CMS-HCC)  Followed by [facility] and Neck Onc clinic.  While treatment is palliative, his overall life expectancy seems fairly good if compliant on medications (Lenvima). He has planned repeat scans in 4 weeks. /  --continue to follow with surgery team  --repeat scans in 4 weeks per surgery team  --will place pain management referral today (11/30/2022) due to ongoing surgical site pain.         Vernelle Emerald, DO  Sports Medicine Fellow   [facility]  Attending: Dr. Manson Passey    Subjective   Paul Hines is a 52 y.o. male  coming to clinic today for the following issues:    No chief complaint on file.    HPI:    Paul Hines is a 52 year old male with history of metastatic thyroid cancer s/p resection by Dr. Azucena Fallen on 04/12/2020 and neck dissection on 07/28/2020, poorly controlled type 2 diabetes, hypertension, and ischemic cardiac disease status post cath in 2021.      # Diabetes: not controlled. Pt brought his medications in today which were reviewed. Missed last pharmD follow up but did see them a few weeks ago. Dexcom prior auth submitted.     # Metastatic papillary thyroid cancer: Followed by [facility] head and neck oncology clinic. Treatment goals are palliative.   - Tg: 75 (02/17/2020) --> 43 (06/24/2020) --> 212 (01/04/2021) --> 323 (02/01/21) ---> 502 (06/15/21) --> 662 (10/05/2021) --> 70 (02/01/2022) --> 107 (03/15/2022)  - Currently on Lenvima (02/21/21- current); notable for multiple missed doses  - CT Neck/Chest (03/15/2022): stable disease  - Continue Lenvima 18 mg daily    # Hypertension: Still not controlled.        Medications: spironolactone 25         I have reviewed the problem list, medications, and allergies and have updated/reconciled them if needed.    Paul Hines  reports that he has been smoking cigarettes. He has been exposed to tobacco smoke. He has never used smokeless tobacco.  Health Maintenance   Topic Date Due    Retinal Eye Exam  Never done    Colon Cancer Screening  Never done    Zoster Vaccines (2 of 2) 12/29/2022    Hemoglobin A1c  01/18/2023    Foot Exam  10/19/2023    Urine Albumin/Creatinine Ratio  10/19/2023    Serum Creatinine Monitoring  11/03/2023    Potassium Monitoring  11/03/2023    DTaP/Tdap/Td Vaccines (6 - Td or Tdap) 11/02/2032    Pneumococcal Vaccine 0-64  Completed    Hepatitis C Screen  Completed    COVID-19 Vaccine  Completed    Influenza Vaccine  Completed       Objective     VITALS: There were no vitals taken for this visit.    Physical Exam  Constitutional:       General: He is not in acute distress.     Appearance: Normal appearance. He is normal weight. He is not toxic-appearing.   HENT:      Head: Normocephalic and atraumatic.      Nose: Nose normal.   Eyes:      Extraocular Movements: Extraocular movements intact.      Conjunctiva/sclera: Conjunctivae normal.   Cardiovascular:      Rate and Rhythm: Normal rate.   Pulmonary:      Effort: Pulmonary effort is normal.   Musculoskeletal:      Cervical back: Normal range of motion and neck supple.   Skin:     General: Skin is warm.   Neurological:      Mental Status: He is alert and oriented to person, place, and time. Mental status is at baseline.         LABS/IMAGING  No visits with results within 3 Day(s) from this visit.   Latest known visit with results is:   Lab on 11/03/2022   Component Date Value    Sodium 11/03/2022 134 (L)     Potassium 11/03/2022 4.3     Chloride  11/03/2022 99     CO2 11/03/2022 29.0     Anion Gap 11/03/2022 6     BUN 11/03/2022 18     Creatinine 11/03/2022 0.86     BUN/Creatinine Ratio 11/03/2022 21     eGFR CKD-EPI (2021) Male 11/03/2022 >90     Glucose 11/03/2022 538 (HH)     Calcium 11/03/2022 9.1     Albumin 11/03/2022 3.0 (L)     Total Protein 11/03/2022 6.7     Total Bilirubin 11/03/2022 0.2 (L)     AST 11/03/2022 12     ALT 11/03/2022 <7 (L)     Alkaline Phosphatase 11/03/2022 80     THYROGLOBULIN AB 11/03/2022 <1.8     Free T4 11/03/2022 1.35     TSH 11/03/2022 0.016 (L)     WBC 11/03/2022 6.9     RBC 11/03/2022 5.33     HGB 11/03/2022 12.5 (L)     HCT 11/03/2022 38.6 (L)     MCV 11/03/2022 72.4 (L)     MCH 11/03/2022 23.4 (L)     MCHC 11/03/2022 32.4     RDW 11/03/2022 14.1     MPV 11/03/2022 9.0     Platelet 11/03/2022 240     Neutrophils % 11/03/2022 68.0     Lymphocytes % 11/03/2022 22.5     Monocytes % 11/03/2022 7.2     Eosinophils % 11/03/2022 1.7     Basophils % 11/03/2022 0.6     Absolute Neutrophils 11/03/2022 4.7     Absolute Lymphocytes 11/03/2022 1.6     Absolute Monocytes 11/03/2022 0.5     Absolute Eosinophils 11/03/2022 0.1     Absolute Basophils 11/03/2022 0.0     Microcytosis 11/03/2022 Slight (A)     Hypochromasia 11/03/2022 Marked (A)     Smear Review Comments 11/03/2022 See Comment (A)     Thyroglobulin, Tumor Mar* 11/03/2022 39 (H)     Thyroglubulin, IA, Interp 11/03/2022 SEE COMMENTS        [facility]  [facility]  [address] [place], [place] [address]  Telephone [phone]  Fax [phone]  [email or web address]

## 2022-12-15 DIAGNOSIS — Z9641 Presence of insulin pump (external) (internal): Principal | ICD-10-CM

## 2022-12-15 DIAGNOSIS — E119 Type 2 diabetes mellitus without complications: Principal | ICD-10-CM

## 2022-12-15 NOTE — Unmapped (Addendum)
Provided follow-up counseling to Lu Duffel, 52 y.o. for treatment of tobacco use/dependence.     SUMMARY: SW re-assessed pt's tobacco use status and followed up about treatment plan. Pt endorsed current daily tobacco use of 3 cpd. Pt expressed continued interest and motivation in tobacco cessation. Pt reported he has been using the nicotine gum about 4 times per day with success. SW offered support and encouragement to pt. SW and pt reviewed psychoeducation regarding behavioral techniques that can be used in tobacco cessation. SW and pt reviewed behavioral strategies pt could adopt to help mitigate his urges and reduce his tobacco use (listed below). SW made plans to follow up with pt. Pt has SW's number if additional needs arise.    Tobacco Use Treatment  Program: [facility]  Type of Visit: Follow-up  Session Number: 6  Tobacco Use Treatment Visit: Talked with patient  Permission To Engage In Conversation Re: PHI w/ Visitors Present: n/a  Goals Of Session: Insight, increase, Assessment, Communication of feelings, Stress management, increase, Behavior management, improve    Cancer Center Patients  Primary Service: Other (TA)  Primary Cancer Type: Head and Neck    Tobacco Use During Past 30 Days  Time Since Last Tobacco Use: smoked a cigarette today (at least one puff)  Tobacco Withdrawal (Past 24 Hours): None noted  Type of Tobacco Products Used: Cigarettes  Quantity Used: 3  Quantity Per: day  Relight Cigarettes: Yes  Time to First Use After Waking: >60 minutes  Wake During Sleep to Smoke: Never  Other Household Members Use Tobacco: Yes  Smoking Allowed in Home: Yes  Smoking Allowed in Vehicles: N/A  Smoke During Work Day: N/A    Tobacco Use History  Most Recent Attempt: Currently working on  Medications Used in Past Attempts: None    Behavioral Assessment  Why Uses: 1. Long Standing Habit 2. Stress 3. Those around him smoke  Reasons to Become Tobacco Free: 1. Health  Barriers/Challenges: 1. Habit 2. Stress  Strategies: 1. Nicotine patch and gum 2. Mindfulness 3. Behavioral Friction    Treatment Plan  Cessation Meds Currently Using: Gum 4mg , Patch 21mg   Outpatient/Discharge Medications Recommended: Patch 21mg , Gum 4mg   Plan to Obtain Outpatient Meds: Pt already has medication  Patient's Plan Post Discharge/Visit: Plan to quit as soon as possible  Follow-up Plan: Permission given, Phone follow-up scheduled  Family Members Included in Intervention/Plan: No    TTS Information  Diagnosis: Tobacco use disorder, unspecified, uncomplicated (F17.200)  Interventions: Assessed, Discussed, Informed, Motivational interviewing, Tx plan development, Encouraged  TTS Visit Length: 3-10 minutes    Simranjit Thayer Valentina Lucks, LCSW-A  Tobacco Treatment Counselor  [facility]  [phone]

## 2022-12-15 NOTE — Unmapped (Signed)
[facility] Clinical Assessment & Refill Coordination Note    Paul Hines, DOB: 1971-01-24  Phone: [phone] (home)     All above HIPAA information was verified with patient.     Was a Nurse, learning disability used for this call? No    Specialty Medication(s):   Hematology/Oncology: Lenvima and Inflammatory Disorders: Cosentyx     Current Outpatient Medications   Medication Sig Dispense Refill    acetaminophen (TYLENOL) 500 MG tablet Take 2 tablets (1,000 mg total) by mouth every six (6) hours as needed for pain. 120 tablet 2    alcohol swabs (ALCOHOL PREP PADS) PadM Use three times a day as needed. 100 each 0    amlodipine (NORVASC) 10 MG tablet Take 1 tablet (10 mg total) by mouth daily. 90 tablet 3    atorvastatin (LIPITOR) 80 MG tablet Take 1 tablet (80 mg total) by mouth nightly. 90 tablet 2    blood sugar diagnostic (GLUCOSE BLOOD) Strp Disp test strips preferred by insurance plan. Testing qday, Dx: E11.9 (Type 2 DM- controlled) 50 strip 11    blood-glucose meter kit Disp. blood glucose meter kit preferred by patient's insurance. Check blood sugars as directed by provider. Dx: Diabetes, E11.9 1 each 11    carvedilol (COREG) 6.25 MG tablet Take 1 tablet (6.25 mg total) by mouth two (2) times a day. (Patient not taking: Reported on 11/13/2022) 180 tablet 3    cetirizine (ZYRTEC) 10 MG tablet Take 1 tablet (10 mg total) by mouth daily. 30 tablet 2    cetirizine (ZYRTEC) 10 MG tablet Take 1 tablet (10 mg total) by mouth daily. 30 tablet 2    clindamycin (CLEOCIN T) 1 % lotion Apply topically two (2) times a day. 60 mL 11    DEXCOM G7 RECEIVER Misc Use as directed. 1 each 0    DEXCOM G7 SENSOR Devi Apply Dexcom G7 sensor every 10 days. 9 each 3    empty container Misc Use as directed to dispose of Humira pens. 1 each 2    gabapentin (NEURONTIN) 300 MG capsule Take 1 capsule (300 mg total) by mouth two (2) times a day. 180 capsule 2    hydrOXYzine (ATARAX) 25 MG tablet Take 1 tablet (25 mg total) by mouth every eight (8) hours as needed. 30 tablet 0    ibuprofen (MOTRIN) 400 MG tablet Take 1 tablet (400 mg total) by mouth Three (3) times a day as needed for pain. 90 tablet 2    insulin glargine (BASAGLAR, LANTUS) 100 unit/mL (3 mL) injection pen Inject 0.3 mL (30 Units total) under the skin nightly. 15 mL 2    lancets Misc Use to check blood glucose once daily. 100 each 1    lancets Misc Disp. lancets #100 or amount allowed, Testing Qday. Dx: E11.9 (Diabetes- controlled) 100 each 11    lenvatinib (LENVIMA) 18 mg/day (10 mg x 1 AND 4 mg x 2) cap Take one (10mg ) capsule plus two (4mg ) capsules by mouth daily for a total of 18mg  daily. 90 capsule 2    levothyroxine (SYNTHROID) 300 MCG tablet Take 1 tablet (300 mcg total) by mouth daily. 90 tablet 3    losartan-hydroCHLOROthiazide (HYZAAR) 100-25 mg per tablet Take 1 tablet by mouth daily. 90 tablet 3    metFORMIN (GLUCOPHAGE) 500 MG tablet Take 2 tablets (1,000 mg total) by mouth in the morning and 2 tablets (1,000 mg total) in the evening. Take with meals. 360 tablet 2  nicotine (NICODERM CQ) 21 mg/24 hr patch Place 1 patch on the skin daily. Remove old patch before applying new one. (Patient not taking: Reported on 11/13/2022) 28 patch 2    nicotine polacrilex (NICORETTE) 4 MG gum Chew 1 piece (4 mg total) and park in cheek every hour as needed for smoking cessation. Max is up to 24 pieces/day. (Patient not taking: Reported on 11/13/2022) 110 each 2    pen needle, diabetic (PEN NEEDLE) 31 gauge x 5/16 (8 mm) Ndle Injection Frequency is 1 time per day 100 each 11    pen needle, diabetic (TRUEPLUS PEN NEEDLE) 32 gauge x 5/32 (4 mm) Ndle Use with insulin glargine once daily 100 each 1    secukinumab (COSENTYX UNOREADY PEN) 300 mg/2 mL (150 mg/mL) PnIj Inject the contents of 1 pen (300mg ) under the skin weekly for 5 weeks as loading doses. (on weeks 0,1,2,3,4 as loading doses) 10 mL 0    secukinumab (COSENTYX UNOREADY PEN) 300 mg/2 mL (150 mg/mL) PnIj Inject the contents of 1 pen (300mg ) under the skin every 28 days as maintenance 2 mL 11    spironolactone (ALDACTONE) 25 MG tablet Take 1 tablet (25 mg total) by mouth daily. (Patient not taking: Reported on 11/13/2022) 90 tablet 3     No current facility-administered medications for this visit.        Changes to medications: Larry reports no changes at this time.    Allergies   Allergen Reactions    Penicillin Anaphylaxis    Fish Derived     Lisinopril     Peanut Butter Flavor      Per allergy testing    Penicillins Other (See Comments)     Per patient, when he took at age 89 his legs and arms shook. Not sure if it was seizure or not.     Shellfish Containing Products     Soy        Changes to allergies: No    SPECIALTY MEDICATION ADHERENCE     Lenvima 18 mg: 0 days of medicine on hand   Cosyntyx 300  mcg/33ml : 0 doses of medicine on hand     Medication Adherence    Patient reported X missed doses in the last month: 0  Specialty Medication: Lenvima 18mg   Patient is on additional specialty medications: Yes  Additional Specialty Medications: Cosentyx  Patient Reported Additional Medication X Missed Doses in the Last Month: 0          Specialty medication(s) dose(s) confirmed: Regimen is correct and unchanged.     Are there any concerns with adherence? No    Adherence counseling provided? Not needed    CLINICAL MANAGEMENT AND INTERVENTION      Clinical Benefit Assessment:    Do you feel the medicine is effective or helping your condition? Yes    Clinical Benefit counseling provided? Not needed    Adverse Effects Assessment:    Are you experiencing any side effects? No    Are you experiencing difficulty administering your medicine? No    Quality of Life Assessment:    Quality of Life      Oncology  1. What impact has your specialty medication had on the reduction of your daily pain or discomfort level?: None  2. On a scale of 1-10, how would you rate your ability to manage side effects associated with your specialty medication? (1=no issues, 10 = unable to take medication due to side effects): 1  Dermatology  1. What impact has your specialty medication had on the symptoms of your skin condition (i.e. itchiness, soreness, stinging)?: None  2. What impact has your specialty medication had on your comfort level with your skin?: None            How many days over the past month did your condition/medication  keep you from your normal activities? For example, brushing your teeth or getting up in the morning. 0    Have you discussed this with your provider? Not needed    Acute Infection Status:    Acute infections noted within Epic:  No active infections  Patient reported infection: None    Therapy Appropriateness:    Is therapy appropriate based on current medication list, adverse reactions, adherence, clinical benefit and progress toward achieving therapeutic goals? Yes, therapy is appropriate and should be continued     DISEASE/MEDICATION-SPECIFIC INFORMATION      N/A    Oncology: Is the patient receiving adequate infection prevention treatment? Not applicable  Does the patient have adequate nutritional support? Not applicable    PATIENT SPECIFIC NEEDS     Does the patient have any physical, cognitive, or cultural barriers? No    Is the patient high risk? Yes, patient is taking oral chemotherapy. Appropriateness of therapy as been assessed    Did the patient require a clinical intervention? No    Does the patient require physician intervention or other additional services (i.e., nutrition, smoking cessation, social work)? No    SOCIAL DETERMINANTS OF HEALTH     At the [facility], we have learned that life circumstances - like trouble affording food, housing, utilities, or transportation can affect the health of many of our patients.   That is why we wanted to ask: are you currently experiencing any life circumstances that are negatively impacting your health and/or quality of life? No    Social Determinants of Health     Food Insecurity: No Food Insecurity (07/29/2020)    Hunger Vital Sign     Worried About Running Out of Food in the Last Year: Never true     Ran Out of Food in the Last Year: Never true   Internet Connectivity: Not on file   Housing/Utilities: Low Risk  (07/29/2020)    Housing/Utilities     Within the past 12 months, have you ever stayed: outside, in a car, in a tent, in an overnight shelter, or temporarily in someone else's home (i.e. couch-surfing)?: No     Are you worried about losing your housing?: No     Within the past 12 months, have you been unable to get utilities (heat, electricity) when it was really needed?: No   Tobacco Use: High Risk (11/30/2022)    Patient History     Smoking Tobacco Use: Some Days     Smokeless Tobacco Use: Never     Passive Exposure: Current   Transportation Needs: Unmet Transportation Needs (05/26/2022)    PRAPARE - Transportation     Lack of Transportation (Medical): Yes     Lack of Transportation (Non-Medical): Not on file   Alcohol Use: Alcohol Misuse (08/22/2021)    [facility] from [facility], [facility]    AUDIT-C     Frequency of Alcohol Consumption: 2-3 times a week     Average Number of Drinks: 5 or 6     Frequency of Binge Drinking: Weekly   Interpersonal Safety: Unknown (12/15/2022)    Interpersonal Safety  Unsafe Where You Currently Live: Not on file     Physically Hurt by Anyone: Not on file     Abused by Anyone: Not on file   Physical Activity: Not on file   Intimate Partner Violence: Not on file   Stress: Not on file   Substance Use: Not on file (12/15/2022)   Social Connections: Not on file   Financial Resource Strain: Low Risk  (07/29/2020)    Overall Financial Resource Strain (CARDIA)     Difficulty of Paying Living Expenses: Not hard at all   Depression: Not at risk (04/19/2022)    PHQ-2     PHQ-2 Score: 0   Health Literacy: Not on file       Would you be willing to receive help with any of the needs that you have identified today? Not applicable       SHIPPING     Specialty Medication(s) to be Shipped:   Hematology/Oncology: [name] and Inflammatory Disorders: Cosentyx    Other medication(s) to be shipped:  cetirizine, ibuprofen, nicotine gum and patch, amlodipine, gabapentin, lantus, carvedilol, spironolactone, test strips, lancets, dexcom 7 receiver and  sensor, pen needles     Changes to insurance: No    Delivery Scheduled: Yes, Expected medication delivery date: 12/19/22.     Medication will be delivered via Same Day Courier to the confirmed prescription address in [facility].    The patient will receive a drug information handout for each medication shipped and additional FDA Medication Guides as required.  Verified that patient has previously received a Conservation officer, historic buildings and a Surveyor, mining.    The patient or caregiver noted above participated in the development of this care plan and knows that they can request review of or adjustments to the care plan at any time.      All of the patient's questions and concerns have been addressed.    Rollen Sox, [facility]   [facility] Specialty Pharmacist

## 2022-12-15 NOTE — Unmapped (Signed)
[facility] Support Encounter    Contacted by Joyice Faster, Get Real and Heel Coordinator, who reported that he has not attended any sessions yet. Called Mr. Swimmer to check-in.  Mr. Ondo reports that he stayed in [place] longer than he anticipated. Also reports he missed some appointments. Confirmed with Mr. Barile the appointments he missed. Provided scheduling numbers for corresponding appointments. Mr. Marceleno reports that he will call to reschedule missed appointments and is also working on scheduling his rides for his appointments next week.     [name], [name], RN, [facility]   She/Her/Hers  Oncology Nurse Navigator - [name] Patient [facility]  [facility]  [address] [place], [place] [address]    Center: [phone]  Voicemail: [phone]  [email or web address]

## 2022-12-18 NOTE — Unmapped (Signed)
Patient [facility] Cancer Support Encounter    Mr. Devincent left voicemail requesting callback. Called Mr. Yapp back. Reports that he was able to arrange rides for appointments on 11/20. Asked for phone numbers to reschedule Ophthalmology and Family Medicine appointments. Provided appropriate phone numbers.     [name], [name], RN, [facility]   She/Her/Hers  Oncology Nurse Navigator - [name] [facility]  [facility]  [address] [place], [place] [address]    Center: [phone]  Voicemail: [phone]  [email or web address]

## 2022-12-19 MED FILL — CETIRIZINE 10 MG TABLET: ORAL | 30 days supply | Qty: 30 | Fill #0

## 2022-12-19 MED FILL — TRUEPLUS PEN NEEDLE 31 GAUGE X 5/16" (8 MM): 90 days supply | Qty: 100 | Fill #0

## 2022-12-19 MED FILL — COSENTYX UNOREADY PEN 300 MG/2 ML (150 MG/ML) SUBCUTANEOUS: SUBCUTANEOUS | 28 days supply | Qty: 2 | Fill #0

## 2022-12-19 MED FILL — AMLODIPINE 10 MG TABLET: ORAL | 90 days supply | Qty: 90 | Fill #0

## 2022-12-19 MED FILL — LENVIMA 18 MG/DAY (10 MG X 1 AND 4 MG X 2) CAPSULE: ORAL | 30 days supply | Qty: 90 | Fill #2

## 2022-12-19 MED FILL — CARVEDILOL 6.25 MG TABLET: ORAL | 90 days supply | Qty: 180 | Fill #0

## 2022-12-19 MED FILL — GABAPENTIN 300 MG CAPSULE: ORAL | 90 days supply | Qty: 180 | Fill #0

## 2022-12-19 MED FILL — NICOTINE 21 MG/24 HR DAILY TRANSDERMAL PATCH: TRANSDERMAL | 28 days supply | Qty: 28 | Fill #0

## 2022-12-19 MED FILL — IBUPROFEN 400 MG TABLET: ORAL | 30 days supply | Qty: 90 | Fill #0

## 2022-12-19 MED FILL — ACCU-CHEK GUIDE TEST STRIPS: 34 days supply | Qty: 50 | Fill #0

## 2022-12-19 MED FILL — DEXCOM G7 RECEIVER: 90 days supply | Qty: 1 | Fill #0

## 2022-12-19 MED FILL — LANTUS SOLOSTAR U-100 INSULIN 100 UNIT/ML (3 ML) SUBCUTANEOUS PEN: SUBCUTANEOUS | 50 days supply | Qty: 15 | Fill #1

## 2022-12-19 MED FILL — NICOTINE (POLACRILEX) 4 MG GUM: BUCCAL | 5 days supply | Qty: 110 | Fill #0

## 2022-12-20 ENCOUNTER — Ambulatory Visit: Admit: 2022-12-20 | Discharge: 2022-12-20 | Payer: PRIVATE HEALTH INSURANCE

## 2022-12-20 ENCOUNTER — Ambulatory Visit
Admit: 2022-12-20 | Discharge: 2022-12-20 | Payer: PRIVATE HEALTH INSURANCE | Attending: Student in an Organized Health Care Education/Training Program | Primary: Student in an Organized Health Care Education/Training Program

## 2022-12-20 DIAGNOSIS — C73 Malignant neoplasm of thyroid gland: Principal | ICD-10-CM

## 2022-12-20 LAB — TSH: THYROID STIMULATING HORMONE: 0.251 u[IU]/mL — ABNORMAL LOW (ref 0.550–4.780)

## 2022-12-20 LAB — CBC W/ AUTO DIFF
BASOPHILS ABSOLUTE COUNT: 0 10*9/L (ref 0.0–0.1)
BASOPHILS RELATIVE PERCENT: 0.5 %
EOSINOPHILS ABSOLUTE COUNT: 0.2 10*9/L (ref 0.0–0.5)
EOSINOPHILS RELATIVE PERCENT: 2.8 %
HEMATOCRIT: 36.5 % — ABNORMAL LOW (ref 39.0–48.0)
HEMOGLOBIN: 11.8 g/dL — ABNORMAL LOW (ref 12.9–16.5)
LYMPHOCYTES ABSOLUTE COUNT: 1.5 10*9/L (ref 1.1–3.6)
LYMPHOCYTES RELATIVE PERCENT: 22.2 %
MEAN CORPUSCULAR HEMOGLOBIN CONC: 32.2 g/dL (ref 32.0–36.0)
MEAN CORPUSCULAR HEMOGLOBIN: 22.9 pg — ABNORMAL LOW (ref 25.9–32.4)
MEAN CORPUSCULAR VOLUME: 71.2 fL — ABNORMAL LOW (ref 77.6–95.7)
MEAN PLATELET VOLUME: 8.9 fL (ref 6.8–10.7)
MONOCYTES ABSOLUTE COUNT: 0.7 10*9/L (ref 0.3–0.8)
MONOCYTES RELATIVE PERCENT: 10.4 %
NEUTROPHILS ABSOLUTE COUNT: 4.4 10*9/L (ref 1.8–7.8)
NEUTROPHILS RELATIVE PERCENT: 64.1 %
PLATELET COUNT: 300 10*9/L (ref 150–450)
RED BLOOD CELL COUNT: 5.12 10*12/L (ref 4.26–5.60)
RED CELL DISTRIBUTION WIDTH: 15.7 % — ABNORMAL HIGH (ref 12.2–15.2)
WBC ADJUSTED: 6.9 10*9/L (ref 3.6–11.2)

## 2022-12-20 LAB — COMPREHENSIVE METABOLIC PANEL
ALBUMIN: 3 g/dL — ABNORMAL LOW (ref 3.4–5.0)
ALKALINE PHOSPHATASE: 96 U/L (ref 46–116)
ALT (SGPT): 11 U/L (ref 10–49)
ANION GAP: 8 mmol/L (ref 5–14)
AST (SGOT): 16 U/L (ref <=34)
BILIRUBIN TOTAL: 0.2 mg/dL — ABNORMAL LOW (ref 0.3–1.2)
BLOOD UREA NITROGEN: 15 mg/dL (ref 9–23)
BUN / CREAT RATIO: 15
CALCIUM: 9.4 mg/dL (ref 8.7–10.4)
CHLORIDE: 102 mmol/L (ref 98–107)
CO2: 26 mmol/L (ref 20.0–31.0)
CREATININE: 1.03 mg/dL (ref 0.73–1.18)
EGFR CKD-EPI (2021) MALE: 86 mL/min/{1.73_m2} (ref >=60)
GLUCOSE RANDOM: 285 mg/dL — ABNORMAL HIGH (ref 70–179)
POTASSIUM: 4.2 mmol/L (ref 3.4–4.8)
PROTEIN TOTAL: 7.9 g/dL (ref 5.7–8.2)
SODIUM: 136 mmol/L (ref 135–145)

## 2022-12-20 LAB — SLIDE REVIEW

## 2022-12-20 LAB — T4, FREE: FREE T4: 1.98 ng/dL — ABNORMAL HIGH (ref 0.89–1.76)

## 2022-12-20 MED ADMIN — iohexol (OMNIPAQUE) 350 mg iodine/mL solution 75 mL: 75 mL | INTRAVENOUS | @ 18:00:00 | Stop: 2022-12-20

## 2022-12-20 NOTE — Unmapped (Signed)
[facility]  PCP: Vernelle Emerald, DO    Consulting providers:  Otolaryngology: Dr. 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 man with history of poorly controlled T2DM, hypertension, ischemic cardiac disease s/p catheterization in 2021 and metastatic thyroid cancer, s/p resection with Dr. Azucena Fallen on 04/12/20 and neck dissection on 07/28/20. Noted to have metastatic disease with pulmonary involvement on PET/CT (01/14/2022). Treating with 1L Lenvima 18 mg daily (02/21/21-current). Course has been complicated by poor compliance. He presents today in follow up on treatment to review scans.    # Metastatic Papillary Thyroid Cancer, BRAF WT  - Treatment goals are palliative.   - Tg: 75 (02/17/2020) --> 43 (06/24/2020) --> 212 (01/04/2021) --> 323 (02/01/21) ---> 502 (06/15/21) --> 662 (10/05/2021) --> 70 (02/01/2022) --> 107 (03/15/2022) --> 147 (06/20/2022) --> 134 (08/08/2022) --> 46 (10/04/2022) --> 39 (11/03/2022) --> 45 (12/20/2022)  - Currently on Lenvima (02/21/21- current); improved compliance  - CT Neck/Chest (12/20/2022): stable disease; soft tissue density in neck of unclear signficance, will continue to monitor  - Continue Lenvima 18 mg daily  - Labs reviewed and acceptable    # Hidradenitis suppurativa  - Poorly controlled, seen in ED 10/9 a  - Continues to be biggest complaint  - Will present to [facility] ED or [facility] ED today or tomorrow  - Followed by dermatology (Dr. Loura Pardon)    # Hypertension, better control  - Current ramlodipine 10 mg daily, Lorsartan 100 mg, HCTZ 25 mg daily and Carvedilol  - Encouraged medical compliance and frequent home checks  - Managed by his PCP    # Diabetes, poorly controlled  - A1c 14.6% (11/17/21) --> 9.8% (02/01/22) --> 11.4 (06/20/2022)  - Continue metformin 1000 mg twice daily and lantus 30 units nightly  - Discussed importance of compliance     # Hypothyroidism,  -  TSH 0.016, FT4 1.35 (9/6//11/03/2022)  - Poor control due to lack of medication compliance  - Goal TSH <0.1  -  Continue synthroid 200 mcg once daily.  Refilled today    # Supportive care  - Cancer-related pain: Continue on Gabapentin 300mg  + Tramadol 50 mg q6hrs prn (script given today)  - Psychosocial: denies any need for CCSP at this time.   - Constipation: Recommended Miralax     Follow up: 3 months    Paul Hines   Head and Neck Medical Oncology  [place]    --------------------------  Interval History  - Presents for on treatment scans  - States that his HS is very painful and has been draining more recently despite taking medications  - Has been compliant with his lenvima; good tolerance  - Blood pressure has been slightly better controlled  - States that he is not checking his blood pressure or blood sugars at home like he is supposed to  - Denies fevers, chills, CP, SOB, cough, NVD, HFS  - Presents alone    History of Present Illness:  Paul Hines is a 52 y.o. male with history of T2DM, hypertension, ischemic heart disease s/p cardiac catheterization (07/2019) who presents for evaluation of head and neck cancer. I have reviewed his records including history, imaging, pathology reports, and, when applicable, operative notes and summarized his oncologic history below:     Patient was incidentally diagnosed on workup for chest pain in 2021. CTA  cardiac (07/08/2019) showed bilateral pulmonary nodules and repeat imaging from 10/2019 and 12/2019 redemonstrated nodules. PET/CT obtained (10/2019) and was notable for hypermetabolic right hilar and mediastinal lymph nodes. FNA performed on 01/09/2020 to dominant right upper pulmonary nodule and confirmed papillary thyroid carcinoma. Further biopsy of lymph nodes were negative for malignancy.      He was evaluated by Dr. Azucena Fallen in February 2022. At that time he reports intermittent dysphagia, but was not significantly bothersome and did not limit his diet. He otherwise denied palpable neck mass, voice changes, otalgia or weight loss. Pre-treatment Tg was 75 in January 2022. Patient underwent total thyroidectomy on 04/12/2020 with bilateral neck dissection on 07/28/2020. Final pathology consistent with PTC, lymph node involvement (14/14) with positive ENE. Received adjuvant RAI (200 mCi I-131) on 05/20/2020.      PET/CT (01/14/2021) concerning for numerous pulmonary nodules that had increased in size when compared to prior imaging. Otherwise, no evidence of distant disease. Tumor marker was also noted to be elevated at 212 (01/04/2021).      02/01/2021: Present unaccompanied today, feeling well until recent illness as described above. He does report residual numbness all along the underside of his face. Previously smoked 2 packs of cigarettes per day for 14 years. Currently smoking ~3 cigarettes daily. He is not currently employed, but would like to work if possible. He was recently been released from prison and is currently staying with [facility].     Past Medical History:   Diagnosis Date    Abnormal findings on dx imaging of heart and cor circ     Acne vulgaris     Allergic contact dermatitis due to food in contact with skin     Atherosclerosis     Athscl heart disease of native coronary artery w/o ang pctrs     Chronic ischemic heart disease     Cutaneous abscess     Diabetic polyneuropathy (CMS-HCC)     Disorder of skin and subcutaneous tissue     Essential hypertension     H/O medication noncompliance     Hernia, inguinal, unilateral     Hypertrophic cardiomyopathy (CMS-HCC)     Malignant neoplasm of thyroid gland (CMS-HCC)     Presbyopia     Pseudofolliculitis barbae     Shoulder pain, bilateral     Tinea unguium     Type 2 diabetes mellitus (CMS-HCC)     Xerosis cutis        Past Surgical History:   Procedure Laterality Date    PR BRNCHSC EBUS GUIDED SAMPL 1/2 NODE STATION/STRUX N/A 01/09/2020    Procedure: BRONCH, RIGID OR FLEXIBLE, INC FLUORO GUIDANCE, WHEN PERFORMED; WITH EBUS GUIDED TRANSTRACHEAL AND/OR TRANSBRONCHIAL SAMPLING, ONE OR TWO MEDIASTINAL AND/OR HILAR LYMPH NODE STATIONS OR STRUCTURES;  Surgeon: Jerelyn Charles, MD;  Location: MAIN OR [place];  Service: Pulmonary    PR BRNSCHSC TNDSC EBUS DX/TX INTERVENTION PERPH LES Right 01/09/2020    Procedure: BRONCH, RIGID OR FLEXIBLE, INCLUDING FLUORO GUIDANCE, WHEN PERFORMED; WITH TRANSENDOSCOPIC EBUS DURING BRONCHOSCOPIC DIAGNOSTIC OR THERAPEUTIC INTERVENTION(S) FOR PERIPHERAL LESION(S);  Surgeon: Jerelyn Charles, MD;  Location: MAIN OR [place];  Service: Pulmonary    PR BRONCHOSCOPY,COMPUTER ASSIST/IMAGE-GUIDED NAVIGATION Right 01/09/2020    Procedure: ROBOT ION BRONCHOSCOPY,RIGID OR FLEXIBLE,INCLUDE FLUORO WHEN PERFORMED; W/COMPUTER-ASSIST,IMAGE-GUIDED NAVIGATION;  Surgeon: Jerelyn Charles, MD;  Location: MAIN OR [place];  Service: Pulmonary    PR BRONCHOSCOPY,DIAGNOSTIC W LAVAGE Right 01/09/2020    Procedure: BRONCHOSCOPY, RIGID OR FLEXIBLE, INCLUDE FLUOROSCOPIC GUIDANCE WHEN PERFORMED;  W/BRONCHIAL ALVEOLAR LAVAGE;  Surgeon: Jerelyn Charles, MD;  Location: MAIN OR [facility];  Service: Pulmonary    PR BRONCHOSCOPY,TRANSBRON ASPIR BX Right 01/09/2020    Procedure: BRONCHOSCOPY, RIGID/FLEX, INCL FLUORO; W/TRANSBRONCH NDL ASPIRAT BX, TRACHEA, MAIN STEM &/OR LOBAR BRONCHUS;  Surgeon: Jerelyn Charles, MD;  Location: MAIN OR [place];  Service: Pulmonary    PR BRONCHOSCOPY,TRANSBRONCH BIOPSY Right 01/09/2020    Procedure: BRONCHOSCOPY, RIGID/FLEXIBLE, INCLUDE FLUORO GUIDANCE WHEN PERFORMED; W/TRANSBRONCHIAL LUNG BX, SINGLE LOBE;  Surgeon: Jerelyn Charles, MD;  Location: MAIN OR [facility];  Service: Pulmonary    PR CATH PLACE/CORON ANGIO, IMG SUPER/INTERP,W LEFT HEART VENTRICULOGRAPHY N/A 08/26/2019    Procedure: Left Heart Catheterization;  Surgeon: Marlaine Hind, MD;  Location: [facility] CATH;  Service: Cardiology    PR LIGATN THOR DUCT,CERV APPROACH Bilateral 07/28/2020    Procedure: SUTURE &/OR LIG THORACIC DUCT; CERV APPROACH;  Surgeon: Lauralee Evener, MD;  Location: MAIN OR [facility];  Service: ENT    PR REMOVAL NODES, NECK,CERV MOD RAD Bilateral 07/28/2020    Procedure: CERVICAL LYMPHADENECTOMY (MODIFIED RADICAL NECK DISSECTION);  Surgeon: Lauralee Evener, MD;  Location: MAIN OR [facility];  Service: ENT    PR THYROIDECTOMY,MALIG,LTD NECK SURG Bilateral 04/12/2020    Procedure: THYROIDECTOMY, TOTAL OR SUBTOTAL FOR MALIGNANCY; WITH LIMITED NECK DISSECTION;  Surgeon: Lauralee Evener, MD;  Location: MAIN OR [facility];  Service: ENT       Current Outpatient Medications   Medication Sig Dispense Refill    acetaminophen (TYLENOL) 500 MG tablet Take 2 tablets (1,000 mg total) by mouth every six (6) hours as needed for pain. 120 tablet 2    alcohol swabs (ALCOHOL PREP PADS) PadM Use three times a day as needed. 100 each 0    amlodipine (NORVASC) 10 MG tablet Take 1 tablet (10 mg total) by mouth daily. 90 tablet 3    atorvastatin (LIPITOR) 80 MG tablet Take 1 tablet (80 mg total) by mouth nightly. 90 tablet 2    blood sugar diagnostic (GLUCOSE BLOOD) Strp Disp test strips preferred by insurance plan. Testing qday, Dx: E11.9 (Type 2 DM- controlled) 50 strip 11    blood-glucose meter kit Disp. blood glucose meter kit preferred by patient's insurance. Check blood sugars as directed by provider. Dx: Diabetes, E11.9 1 each 11    carvedilol (COREG) 6.25 MG tablet Take 1 tablet (6.25 mg total) by mouth two (2) times a day. (Patient not taking: Reported on 11/13/2022) 180 tablet 3    cetirizine (ZYRTEC) 10 MG tablet Take 1 tablet (10 mg total) by mouth daily. 30 tablet 2    cetirizine (ZYRTEC) 10 MG tablet Take 1 tablet (10 mg total) by mouth daily. 30 tablet 2    clindamycin (CLEOCIN T) 1 % lotion Apply topically two (2) times a day. 60 mL 11    DEXCOM G7 RECEIVER Misc Use as directed. 1 each 0    DEXCOM G7 SENSOR Devi Apply Dexcom G7 sensor every 10 days. 9 each 3    empty container Misc Use as directed to dispose of Humira pens. 1 each 2    gabapentin (NEURONTIN) 300 MG capsule Take 1 capsule (300 mg total) by mouth two (2) times a day. 180 capsule 2    hydrOXYzine (ATARAX) 25 MG tablet Take 1 tablet (25 mg total) by mouth every eight (8) hours as needed. 30 tablet 0    ibuprofen (MOTRIN) 400 MG tablet Take 1 tablet (400 mg total) by mouth Three (3) times a day as  needed for pain. 90 tablet 2    insulin glargine (BASAGLAR, LANTUS) 100 unit/mL (3 mL) injection pen Inject 0.3 mL (30 Units total) under the skin nightly. 15 mL 2    lancets Misc Use to check blood glucose once daily. 100 each 1    lancets Misc Disp. lancets #100 or amount allowed, Testing Qday. Dx: E11.9 (Diabetes- controlled) 100 each 11    lenvatinib (LENVIMA) 18 mg/day (10 mg x 1 AND 4 mg x 2) cap Take one (10mg ) capsule plus two (4mg ) capsules by mouth daily for a total of 18mg  daily. 90 capsule 2    levothyroxine (SYNTHROID) 300 MCG tablet Take 1 tablet (300 mcg total) by mouth daily. 90 tablet 3    losartan-hydroCHLOROthiazide (HYZAAR) 100-25 mg per tablet Take 1 tablet by mouth daily. 90 tablet 3    metFORMIN (GLUCOPHAGE) 500 MG tablet Take 2 tablets (1,000 mg total) by mouth in the morning and 2 tablets (1,000 mg total) in the evening. Take with meals. 360 tablet 2    nicotine (NICODERM CQ) 21 mg/24 hr patch Place 1 patch on the skin daily. Remove old patch before applying new one. (Patient not taking: Reported on 11/13/2022) 28 patch 2    nicotine polacrilex (NICORETTE) 4 MG gum Chew 1 piece (4 mg total) and park in cheek every hour as needed for smoking cessation. Max is up to 24 pieces/day. (Patient not taking: Reported on 11/13/2022) 110 each 2    pen needle, diabetic (PEN NEEDLE) 31 gauge x 5/16 (8 mm) Ndle Injection Frequency is 1 time per day 100 each 11    pen needle, diabetic (TRUEPLUS PEN NEEDLE) 32 gauge x 5/32 (4 mm) Ndle Use with insulin glargine once daily 100 each 1    secukinumab (COSENTYX UNOREADY PEN) 300 mg/2 mL (150 mg/mL) PnIj Inject the contents of 1 pen (300mg ) under the skin weekly for 5 weeks as loading doses. (on weeks 0,1,2,3,4 as loading doses) 10 mL 0    secukinumab (COSENTYX UNOREADY PEN) 300 mg/2 mL (150 mg/mL) PnIj Inject the contents of 1 pen (300mg ) under the skin every 28 days as maintenance 2 mL 11    spironolactone (ALDACTONE) 25 MG tablet Take 1 tablet (25 mg total) by mouth daily. (Patient not taking: Reported on 11/13/2022) 90 tablet 3     No current facility-administered medications for this visit.       Allergies   Allergen Reactions    Penicillin Anaphylaxis    Fish Derived     Lisinopril     Peanut Butter Flavor      Per allergy testing    Penicillins Other (See Comments)     Per patient, when he took at age 75 his legs and arms shook. Not sure if it was seizure or not.     Shellfish Containing Products     Soy        Social History     Tobacco Use    Smoking status: Some Days     Types: Cigarettes     Passive exposure: Current    Smokeless tobacco: Never   Vaping Use    Vaping status: Never Used   Substance Use Topics    Alcohol use: Yes    Drug use: Not Currently     Types: Marijuana     Comment: quit in 1992       Social History     Social History Narrative    Not on file  Family History   Problem Relation Age of Onset    Melanoma Neg Hx     Basal cell carcinoma Neg Hx     Squamous cell carcinoma Neg Hx        Review of Systems: A 12-system review of systems was obtained including: Constitutional, Eyes, ENT, Cardiovascular, Respiratory, GI, GU, Musculoskeletal, Skin, Neurological, Psychiatric, Endocrine, Heme/Lymphatic, and Allergic/Immunologic systems. It is negative or non-contributory to the patient???s management except for as stated in patient's HPI    ECOG PS: 1    Physical Examination:  Vital Signs: BP 140/98  - Pulse 99  - Temp 36.9 ??C (98.4 ??F) (Temporal)  - Resp 16  - Ht 170.2 cm (5' 7)  - Wt 78.1 kg (172 lb 3.2 oz)  - SpO2 99%  - BMI 26.97 kg/m??   CONSTITUTIONAL: Pleasant man, mild discomfort due to HS  Oral Cavity: MMM, no oral lesions or mucositis  Lymphatics: No lymphadenopathy   CV: RRR; no lower extremity edema  RESP: normal work of breathing  GI: Soft, non-tender, non-distended  SKIN: No skin rashes; groin not examined however tender per patient  NEURO: no focal deficits appreciated,  PSYCH: Normal mood and appropriate affect    LABS  Lab Results   Component Value Date    WBC 6.9 12/20/2022    HGB 11.8 (L) 12/20/2022    HCT 36.5 (L) 12/20/2022    PLT 300 12/20/2022       Lab Results   Component Value Date    NA 136 12/20/2022    K 4.2 12/20/2022    CL 102 12/20/2022    CO2 26.0 12/20/2022    BUN 15 12/20/2022    CREATININE 1.03 12/20/2022    GLU 285 (H) 12/20/2022    CALCIUM 9.4 12/20/2022    MG 1.6 07/31/2020    PHOS 3.0 07/31/2020       Lab Results   Component Value Date    BILITOT 0.2 (L) 12/20/2022    PROT 7.9 12/20/2022    ALBUMIN 3.0 (L) 12/20/2022    ALT 11 12/20/2022    AST 16 12/20/2022    ALKPHOS 96 12/20/2022       No results found for: PT, INR, APTT      IMAGING  CT Neck (12/20/2022)  New rounded likely soft tissue densities in the visceral space of the neck between the trachea and esophagus that could represent recurrent nodal disease. However, evaluation is limited due to beam artifact from the contrast injection.      No other evidence of new metastatic or recurrent disease.      Multiple pulmonary nodules. Please see same-day dedicated CT chest for characterization of findings below the clavicles.       CT Chest (12/20/2022)  No evidence of new or worsening thoracic metastasis.      Stable bilateral pulmonary metastatic nodules since 09/20/2022.       PATHOLOGY  THYROID GLAND  8th Edition - Protocol posted: 03/27/2018   THYROID GLAND: RESECTION - All Specimens  Clinical History  No known radiation exposure    SPECIMEN   Procedure  Total thyroidectomy    TUMOR   Tumor Focality  Multifocal    Tumor Characteristics     Tumor Site  Right lobe      Left lobe      Isthmus Histologic Type  Papillary carcinoma, classic (usual, conventional)    Tumor Size  Greatest Dimension (Centimeters): 3.1 cm   Extrathyroidal Extension  Not  identified    Angioinvasion (vascular invasion)  Not identified    Lymphatic Invasion  Not identified    Perineural Invasion  Not identified    Margins  Uninvolved by carcinoma    Distance of Invasive Carcinoma from Closest Margin (Millimeters)  1 mm   Mitotic Rate  4 Mitoses per 2 mm^2   LYMPH NODES   Number of Lymph Nodes Involved  14    Nodal Levels Involved  Level VI    Size of Largest Metastatic Deposit (Centimeters)  5 cm   Extranodal Extension (ENE)  Present    Number of Lymph Nodes Examined  14    Nodal Levels Examined  Level VI    PATHOLOGIC STAGE CLASSIFICATION (pTNM, AJCC 8th Edition)   TNM Descriptors  m (multiple primary tumors)         Primary Tumor (pT)  pT2    Regional Lymph Nodes (pN)  pN1a    Distant Metastasis (pM)  pM1    Site(s)  lung see [id number]      I have personally reviewed relevant imaging, laboratory values, existing medical records, and pathology. I have summarized these findings in the oncology history above.

## 2022-12-25 NOTE — Unmapped (Signed)
Telephone outreach to patient to schedule [facility] Pharmacotherapy visit, with focus on new Dexcom sensor.       Karalee Height, PharmD CPP  [facility] Family Medicine Clinical Pharmacist

## 2022-12-25 NOTE — Unmapped (Signed)
[facility] On Call Intervention    Mr.Kaczmarek paged the On Call Specialty Pharmacist on 12/22/2022 at 4:48pm  regarding their medication Inflammatory Disorders: Cosentyx.     Type of intervention:  Medication access    Action taken: Pharmacist at [facility] called regarding a patient's medication, Cosentyx.  Attempting to fill medication while patient is inpatient to prevent delay however when processed claim was receiving a refill too soon and was calling to reverse our fill. Informed pharmacist that medication was already filled and delivered and unable to reverse.     Follow-up needed: n/a    Time spent: 15 minutes      This encounter has been routed to the patient's primary specialty pharmacist.      Teofilo Pod, PharmD  [facility] Pharmacist

## 2022-12-27 NOTE — Unmapped (Signed)
Nursing team, Please let home health know that I will sign orders on behalf of Dr. Ulice Brilliant.

## 2022-12-27 NOTE — Unmapped (Signed)
[facility] SUBJECT FOR NEW MSG: Home Health Report Patient Name: Paul Hines   Caller: Home Health  Name of Caller: Judeth Cornfield [facility]  Is a call back needed: yes   Call Back Phone Number: [phone]   Last seen in-person: 11/30/2022  Last telemedicine visit: Visit date not found    Next scheduled visit date: Visit date not found   Report: Home Health is asking if you will be willing to follow and sign for the patient's home health orders.

## 2022-12-27 NOTE — Unmapped (Signed)
Patient [facility] Cancer Support Encounter    Called Mr. Bassin back. Reports that he is unable to get a hold of [employer] to address his stolen phone. Called [employer]. Reports that stolen phone is still active and that he will need to cancel account before another phone can be requested. Account cancellation request submitted. [employer] to call Mr. Neyens at hospital number [phone]) in 24-48 hours. Mr. Sorgi told that he will not be discharged until 11/29. Updated Mr. Cesar and provided Case ID and Pin number. Mr. Chronister to apply for new phone once account cancelled.     [name], [name], RN, [facility]   She/Her/Hers  Oncology Nurse Navigator - [name] [facility]  [facility]  [address], [place], [place] [address]    Center: [phone]  Voicemail: [phone]  [email or web address]

## 2022-12-28 NOTE — Unmapped (Signed)
Nurse called Home health to give approval.

## 2022-12-29 MED ORDER — RIFAMPIN 300 MG CAPSULE
ORAL_CAPSULE | Freq: Two times a day (BID) | ORAL | 0 refills | 90.00 days
Start: 2022-12-29 — End: ?

## 2022-12-29 MED ORDER — CLINDAMYCIN HCL 300 MG CAPSULE
ORAL_CAPSULE | Freq: Two times a day (BID) | ORAL | 0 refills | 90.00 days
Start: 2022-12-29 — End: ?

## 2023-01-02 ENCOUNTER — Ambulatory Visit
Admit: 2023-01-02 | Discharge: 2023-01-03 | Payer: PRIVATE HEALTH INSURANCE | Attending: Dermatology | Primary: Dermatology

## 2023-01-02 DIAGNOSIS — C73 Malignant neoplasm of thyroid gland: Principal | ICD-10-CM

## 2023-01-02 DIAGNOSIS — L732 Hidradenitis suppurativa: Principal | ICD-10-CM

## 2023-01-02 MED ORDER — CETIRIZINE 10 MG TABLET
ORAL_TABLET | Freq: Every day | ORAL | 2 refills | 30 days | Status: CP
Start: 2023-01-02 — End: 2024-01-02
  Filled 2023-03-19: qty 30, 30d supply, fill #0

## 2023-01-02 MED ORDER — COSENTYX UNOREADY PEN 300 MG/2 ML (150 MG/ML) SUBCUTANEOUS
11 refills | 0 days | Status: CP
Start: 2023-01-02 — End: 2023-01-02

## 2023-01-02 MED ORDER — HYDROXYZINE HCL 25 MG TABLET
ORAL_TABLET | Freq: Three times a day (TID) | ORAL | 0 refills | 10 days | Status: CP | PRN
Start: 2023-01-02 — End: ?
  Filled 2023-01-02: qty 30, 10d supply, fill #0

## 2023-01-02 MED ORDER — BIMEKIZUMAB-BKZX 160 MG/ML SUBCUTANEOUS AUTO-INJECTOR
11 refills | 0 days | Status: CP
Start: 2023-01-02 — End: ?
  Filled 2023-01-08: qty 4, 28d supply, fill #0

## 2023-01-02 NOTE — Unmapped (Addendum)
Continue Clindamycin and rifampin  Will increase cosentyx to every 14 days  Follow up with pain clinic and Dr. Janyth Contes     Meet your team:     Your nurse is: Pam    Please remember to fill out the survey you will receive after your visit. Your comments help [place] continue to improve our care.      Thanks in advance!      [facility] Dermatology Clinical Staff

## 2023-01-02 NOTE — Unmapped (Signed)
Dermatology Note     Assessment and Plan:      Hidradenitis suppurativa, severe, Hurley III, Chronic: flared or not at treatment goal  Current stage 4 metastatic thyroid cancer being treated with lenvatinib   - We discussed the typical natural history, pathogenesis, treatment options, and expected course as well as the relapsing and sometimes recalcitrant nature of the disease and potential for sinus tract formation and scarring.    - We discussed associated comorbid conditions (pilonidal cysts, IBD, psoriasis, acne, dissecting cellulitis)  - Discussed that combination of medical and surgical therapy is often required for treatment.   - Reviewed association between HS and tobacco use; encouraged cessation  - Discussed that the medication he is on for his metastatic thyroid cancer (lenvatinib) can sometimes cause lesions to flare  - Patient has a history of uncontrolled diabetes mellitus (most recent A1C 13.6 on 11/24), encouraged glycemic control as this can also help with disease activity  - Referring oncologist, Dr. Ginette Otto Hemant     Plan:  - Will attempt to switch to   - Referred to Dr Janyth Contes for unroofing procedure consultation (appointment 02/19/22)  - Discussed importance of pain clinic appointment 12/30  - Currently getting home health wound care s/p hospitalization  - Will attempt to get coverage for bimekizumab recently approved for HS given continued flares with cosentyx, humira  - Continue clindamycin 300mg  BID  - Continue rifampin 300mg  BID  - continue clindamycin (CLEOCIN T) 1 % lotion; Apply topically two (2) times a day.  - Continue Hibiclens wash qday in shower  - continue hydrOXYzine (ATARAX) 25 MG tablet; Take 1 tablet (25 mg total) by mouth every eight (8) hours as needed for itching.  Dispense: 30 tablet; Refill: 0  - continue cetirizine (ZYRTEC) 10 MG tablet; Take 1 tablet (10 mg total) by mouth daily.  Dispense: 30 tablet; Refill: 2  - START bimekizumab-bkzx 160 mg/mL AtIn; Inject 320mg  every 2 weeks for the first 16 weeks followed by every 4 weeks.  Dispense: 4 mL; Refill: 4  - START bimekizumab-bkzx 160 mg/mL AtIn; Inject 320mg  subcutaneous every 4 weeks  Dispense: 2 mL; Refill: 11  - For now continue cosentyx 300mg  every 4 weeks. Could consider increase dose if above not approved       High risk medication use, Humira  - Quantiferon TB negative March 2024  - hepatitis panel within normal limits 2024      The patient was advised to call for an appointment should any new, changing, or symptomatic lesions develop.     RTC: Return for Next scheduled follow up with Dr. Janyth Contes. or sooner as needed   _________________________________________________________________      Chief Complaint     Chief Complaint   Patient presents with    Follow-up     HS surgery at hospital groin both sides       HPI     Paul Hines is a 52 y.o. male who presents as a returning patient (last seen 10/03/2022) to Dermatology for follow up of HS. He is currently having a severe flare in the groin area. Was recently in the hospital at [facility] with concern for [facility] s/p debridement consistent with HS. Culture with 1+ group B strep. On clindamycin and rifampin. Reports continued severe pain to groin area. Restarted cosentyx 11/27 at home.     The patient denies any other new or changing lesions or areas of concern.     Pertinent Past Medical History  No history of skin cancer    Problem List          Endocrine    Thyroid cancer (CMS-HCC)    Added automatically from request for surgery [id number]         Relevant Medications    hydrOXYzine (ATARAX) 25 MG tablet    cetirizine (ZYRTEC) 10 MG tablet       Musculoskeletal and Integument    Hidradenitis suppurativa    Relevant Medications    bimekizumab-bkzx 160 mg/mL AtIn    bimekizumab-bkzx 160 mg/mL AtIn     HS    Family History:   Negative for melanoma    Past Medical History, Family History, Social History, Medication List, Allergies, and Problem List were reviewed in the rooming section of Epic.     ROS: Other than symptoms mentioned in the HPI, no fevers, chills, or other skin complaints    Physical Examination     GENERAL: Well-appearing male in no acute distress, in pain  NEURO: Alert and oriented, answers questions appropriately  PSYCH: Normal mood and affect  SKIN (Focal Skin Exam): Per patient request, examination of groin, buttocks was performed    Draining sinus tract on lower abdomen  Numerous inflamed nodules and tunnels to bilateral inguinal creases, groin, and buttocks with significant scarring   Ulcers to bilateral inguinal folds with healthy granulation tissue at the base. No drainage.         (Approved Template 10/13/2019)

## 2023-01-02 NOTE — Unmapped (Signed)
Patient [facility] Cancer Support Encounter    Met with patient in the [facility]. Provided active listening and emotional support as patient discussed current needs and challenges.    Reports that his phone number has changed. Asked that I update in the system.     Also reports that since he was admitted at [facility] on 11/20, they stopped his lenvatinib (LENVIMA) 18 mg/day (10 mg x 1 AND 4 mg x 2) cap. Unsure when to restart. Will report concern to [facility] oncology team.     [name], [name], RN, [facility]   She/Her/Hers  Oncology Nurse Navigator - [name]  [facility]  [address], [place], [place] [address]    Center: [phone]  Voicemail: [phone]  [email or web address]

## 2023-01-03 DIAGNOSIS — L732 Hidradenitis suppurativa: Principal | ICD-10-CM

## 2023-01-04 NOTE — Unmapped (Signed)
[facility] Specialty Medication Onboarding    Specialty Medication: Bimzelx  Prior Authorization: Approved   Financial Assistance: No - copay  <$25  Final Copay/Day Supply: $4 / 28 days (LD & MD)    Insurance Restrictions: None     Notes to Pharmacist: t already enrolled in Melanoma que  Credit Card on File: no    The triage team has completed the benefits investigation and has determined that the patient is able to fill this medication at [facility]. Please contact the patient to complete the onboarding or follow up with the prescribing physician as needed.

## 2023-01-04 NOTE — Unmapped (Signed)
Specialty and Home Delivery Pharmacy    Patient Onboarding/Medication Counseling    Paul Hines is a 52 y.o. male with hidradenitis suppurativa who I am counseling today on initiation of therapy.  I am speaking to the patient.    Was a Nurse, learning disability used for this call? No    Verified patient's date of birth / HIPAA.    Specialty medication(s) to be sent: Inflammatory Disorders: Bimzelx      Non-specialty medications/supplies to be sent: NA      Medications not needed at this time: NA         Bimzelx (bimekizumab)    Medication & Administration     Dosage: Hidradenitis Suppurativa: Inject 320 mg (given as two 160 mg injections) at Week 0, 2, 4, 6, 8, 10, 12, 14 and 16, then every 4 weeks thereafter      Lab tests required prior to treatment initiation:  Tuberculosis: Tuberculosis screening resulted in a non-reactive Quantiferon TB Gold assay.  Liver Function Tests: LFTs, Alk Phos, and bilirubin are documented in the patient's chart.      Administration:     Systems developer all supplies needed for injection on a clean, flat working surface: medication pen removed from packaging, alcohol swab, sharps container, etc.  Look at the medication label - look for correct medication, correct dose, and check the expiration date  Check the medicine through the viewing window. The medicine should be slightly pearly and colorless to pale brownish-yellow and free of particles. You may see air bubbles in the liquid. This is normal. Do not used the pre-filled pen if the medicine is cloudy, discolored, or has particles.   Lay the auto-injector pen on a flat surface and allow it to warm up to room temperature for at least 30-45 minutes  Select injection site - you can use the front of your thigh or your belly (but not the area 2 inches around your belly button); if someone else is giving you the injection you can also use your upper arm in the skin covering your triceps muscle  Prepare injection site - wash your hands and clean the skin at the injection site with an alcohol swab and let it air dry, do not touch the injection site again before the injection  Pull off the safety cap, do not remove until immediately prior to injection and do not touch the green needle cover  Put the green needle cover against your skin at the injection site at a 90 degree angle, hold the pen such that you can see the clear medication window  Press down and hold the pen firmly against your skin, there will be a click when the injection starts  Continue to hold the pen firmly against your skin for about 15 seconds - the window will start to turn solid yellow  There will be a second click sound when the injection is almost complete, verify the window is solid green to indicate the injection is complete and then pull the pen away from your skin  Dispose of the used auto-injector pen immediately in your sharps disposal container the needle will be covered automatically  If you see any blood at the injection site, press a cotton ball or gauze on the site and maintain pressure until the bleeding stops, do not rub the injection site      Adherence/Missed dose instructions:  If your injection is given more than 4 days after your scheduled injection date - consult your  pharmacist for additional instructions on how to adjust your dosing schedule.        Goals of Therapy     Plaque Psoriasis  Minimize areas of skin involvement (% BSA)  Avoidance of long term glucocorticoid use  Maintenance of effective psychosocial functioning    Hidradenitis Suppurativa  Reduce the frequency and severity of new lesions  Minimize pain and suppuration  Prevent disease progression and limit scarring  Maintenance of effective psychosocial functioning      Side Effects & Monitoring Parameters     Injection site reaction (redness, irritation, inflammation localized to the site of administration)  Signs of a common cold - minor sore throat, runny or stuffy nose, etc.  Diarrhea  Mood changes/suicidal ideation  Liver Function Tests    The following side effects should be reported to the provider:  Signs of a hypersensitivity reaction - rash; hives; itching; red, swollen, blistered, or peeling skin; wheezing; tightness in the chest or throat; difficulty breathing, swallowing, or talking; swelling of the mouth, face, lips, tongue, or throat; etc.  Reduced immune function - report signs of infection such as fever; chills; body aches; very bad sore throat; ear or sinus pain; cough; more sputum or change in color of sputum; pain with passing urine; wound that will not heal, etc.  Also at a slightly higher risk of some malignancies (mainly skin and blood cancers) due to this reduced immune function.  In the case of signs of infection - the patient should hold the next dose of Bimzelx?? and call your primary care provider to ensure adequate medical care.  Treatment may be resumed when infection is treated and patient is asymptomatic.  Muscle pain or weakness  Shortness of breath      Warnings, Precautions, & Contraindications     Have your bloodwork checked as you have been told by your prescriber  Talk with your doctor if you are pregnant, planning to become pregnant, or breastfeeding  Discuss the possible need for holding your dose(s) of Bimzelx?? when a planned procedure is scheduled with the prescriber as it may delay healing/recovery timeline       Drug/Food Interactions     Medication list reviewed in Epic. The patient was instructed to inform the care team before taking any new medications or supplements. No drug interactions identified.   Talk with you prescriber or pharmacist before receiving any live vaccinations while taking this medication and after you stop taking it      Storage, Handling Precautions, & Disposal     Store this medication in the refrigerator.  Do not freeze  May store intact Bimzelx pens and 160 mg/mL prefilled syringes at <=25??C (<=77??F) for up to 30 days; do NOT return to the refrigerator   Store in original packaging, protected from light  Do not shake  Dispose of used syringes/pens in a sharps disposal container           Current Medications (including OTC/herbals), Comorbidities and Allergies     Current Outpatient Medications   Medication Sig Dispense Refill    acetaminophen (TYLENOL) 500 MG tablet Take 2 tablets (1,000 mg total) by mouth every six (6) hours as needed for pain. 120 tablet 2    alcohol swabs (ALCOHOL PREP PADS) PadM Use three times a day as needed. 100 each 0    amlodipine (NORVASC) 10 MG tablet Take 1 tablet (10 mg total) by mouth daily. 90 tablet 3    atorvastatin (LIPITOR) 80 MG tablet Take 1  tablet (80 mg total) by mouth nightly. 90 tablet 2    bimekizumab-bkzx 160 mg/mL AtIn Inject the contents of 2 pens (320mg ) every 2 weeks for the first 16 weeks followed by every 4 weeks. 4 mL 4    bimekizumab-bkzx 160 mg/mL AtIn Inject the contents of 2 pens (320mg ) subcutaneous every 4 weeks 2 mL 11    blood sugar diagnostic (GLUCOSE BLOOD) Strp Disp test strips preferred by insurance plan. Testing qday, Dx: E11.9 (Type 2 DM- controlled) 50 strip 11    blood-glucose meter kit Disp. blood glucose meter kit preferred by patient's insurance. Check blood sugars as directed by provider. Dx: Diabetes, E11.9 1 each 11    carvedilol (COREG) 6.25 MG tablet Take 1 tablet (6.25 mg total) by mouth two (2) times a day. 180 tablet 3    cetirizine (ZYRTEC) 10 MG tablet Take 1 tablet (10 mg total) by mouth daily. 30 tablet 2    cetirizine (ZYRTEC) 10 MG tablet Take 1 tablet (10 mg total) by mouth daily. 30 tablet 2    clindamycin (CLEOCIN T) 1 % lotion Apply topically two (2) times a day. 60 mL 11    clindamycin (CLEOCIN) 300 MG capsule Take 1 capsule (300 mg total) by mouth Three (3) times a day.      DEXCOM G7 RECEIVER Misc Use as directed. 1 each 0    DEXCOM G7 SENSOR Devi Apply Dexcom G7 sensor every 10 days. 9 each 3    empty container Misc Use as directed to dispose of Humira pens. 1 each 2    gabapentin (NEURONTIN) 300 MG capsule Take 1 capsule (300 mg total) by mouth two (2) times a day. 180 capsule 2    hydrOXYzine (ATARAX) 25 MG tablet Take 1 tablet (25 mg total) by mouth every eight (8) hours as needed for itching. 30 tablet 0    ibuprofen (MOTRIN) 400 MG tablet Take 1 tablet (400 mg total) by mouth Three (3) times a day as needed for pain. 90 tablet 2    insulin glargine (BASAGLAR, LANTUS) 100 unit/mL (3 mL) injection pen Inject 0.3 mL (30 Units total) under the skin nightly. 15 mL 2    lancets Misc Use to check blood glucose once daily. 100 each 1    lancets Misc Disp. lancets #100 or amount allowed, Testing Qday. Dx: E11.9 (Diabetes- controlled) 100 each 11    lenvatinib (LENVIMA) 18 mg/day (10 mg x 1 AND 4 mg x 2) cap Take one (10mg ) capsule plus two (4mg ) capsules by mouth daily for a total of 18mg  daily. 90 capsule 2    levothyroxine (SYNTHROID) 300 MCG tablet Take 1 tablet (300 mcg total) by mouth daily. 90 tablet 3    losartan-hydroCHLOROthiazide (HYZAAR) 100-25 mg per tablet Take 1 tablet by mouth daily. 90 tablet 3    metFORMIN (GLUCOPHAGE) 500 MG tablet Take 2 tablets (1,000 mg total) by mouth in the morning and 2 tablets (1,000 mg total) in the evening. Take with meals. 360 tablet 2    nicotine (NICODERM CQ) 21 mg/24 hr patch Place 1 patch on the skin daily. Remove old patch before applying new one. 28 patch 2    nicotine polacrilex (NICORETTE) 4 MG gum Chew 1 piece (4 mg total) and park in cheek every hour as needed for smoking cessation. Max is up to 24 pieces/day. 110 each 2    pen needle, diabetic (PEN NEEDLE) 31 gauge x 5/16 (8 mm) Ndle Injection Frequency is 1 time  per day 100 each 11    pen needle, diabetic (TRUEPLUS PEN NEEDLE) 32 gauge x 5/32 (4 mm) Ndle Use with insulin glargine once daily 100 each 1    spironolactone (ALDACTONE) 25 MG tablet Take 1 tablet (25 mg total) by mouth daily. 90 tablet 3     No current facility-administered medications for this visit.       Allergies   Allergen Reactions    Penicillin Anaphylaxis    Fish Derived     Lisinopril     Peanut Butter Flavor      Per allergy testing    Penicillins Other (See Comments)     Per patient, when he took at age 21 his legs and arms shook. Not sure if it was seizure or not.     Shellfish Containing Products     Soy        Patient Active Problem List   Diagnosis    Abnormal computed tomography angiography (CTA)    Thyroid cancer (CMS-HCC)    Obesity    Right inguinal hernia    Diabetes (CMS-HCC)    Essential hypertension    Hidradenitis suppurativa    Postoperative hypothyroidism       Reviewed and up to date in Epic.    Appropriateness of Therapy     Acute infections noted within Epic:  No active infections  Patient reported infection: None    Is the medication and dose appropriate based on diagnosis, medication list, comorbidities, allergies, medical history, patient???s ability to self-administer the medication, and therapeutic goals? Yes    Prescription has been clinically reviewed: Yes      Baseline Quality of Life Assessment      How many days over the past month did your HS  keep you from your normal activities? For example, brushing your teeth or getting up in the morning. Patient declined to answer    Financial Information     Medication Assistance provided: Prior Authorization    Anticipated copay of $4 reviewed with patient. Verified delivery address.    Delivery Information     Scheduled delivery date: 12/9    Expected start date: 12/9 -  confirming with MD.       Medication will be delivered via Same Day Courier to the prescription address in [facility].  This shipment will not require a signature.      Explained the services we provide at [facility] Specialty and Home Delivery Pharmacy and that each month we would call to set up refills.  Stressed importance of returning phone calls so that we could ensure they receive their medications in time each month.  Informed patient that we should be setting up refills 7-10 days prior to when they will run out of medication.  A pharmacist will reach out to perform a clinical assessment periodically.  Informed patient that a welcome packet, containing information about our pharmacy and other support services, a Notice of Privacy Practices, and a drug information handout will be sent.      The patient or caregiver noted above participated in the development of this care plan and knows that they can request review of or adjustments to the care plan at any time.      Patient or caregiver verbalized understanding of the above information as well as how to contact the pharmacy at [phone] option 4 with any questions/concerns.  The pharmacy is open Monday through Friday 8:30am-4:30pm.  A pharmacist is available 24/7 via pager to answer  any clinical questions they may have.    Patient Specific Needs     Does the patient have any physical, cognitive, or cultural barriers? No    Does the patient have adequate living arrangements? (i.e. the ability to store and take their medication appropriately) Yes    Did you identify any home environmental safety or security hazards? No    Patient prefers to have medications discussed with  Patient     Is the patient or caregiver able to read and understand education materials at a high school level or above? Yes    Patient's primary language is  English     Is the patient high risk? No    SOCIAL DETERMINANTS OF HEALTH     At the [facility], we have learned that life circumstances - like trouble affording food, housing, utilities, or transportation can affect the health of many of our patients.   That is why we wanted to ask: are you currently experiencing any life circumstances that are negatively impacting your health and/or quality of life? Patient declined to answer    Social Drivers of Health     Food Insecurity: No Food Insecurity (12/30/2022)    Received from [facility]    Hunger Vital Sign     Worried About Running Out of Food in the Last Year: Never true     Ran Out of Food in the Last Year: Never true   Internet Connectivity: Not on file   Housing/Utilities: Low Risk  (07/29/2020)    Housing/Utilities     Within the past 12 months, have you ever stayed: outside, in a car, in a tent, in an overnight shelter, or temporarily in someone else's home (i.e. couch-surfing)?: No     Are you worried about losing your housing?: No     Within the past 12 months, have you been unable to get utilities (heat, electricity) when it was really needed?: No   Tobacco Use: High Risk (01/02/2023)    Patient History     Smoking Tobacco Use: Every Day     Smokeless Tobacco Use: Never     Passive Exposure: Current   Transportation Needs: No Transportation Needs (12/30/2022)    Received from [facility] - Transportation     In the past 12 months, has lack of transportation kept you from medical appointments or from getting medications?: No     Lack of Transportation (Non-Medical): No   Alcohol Use: Alcohol Misuse (08/22/2021)    Received from [facility]    AUDIT-C     Frequency of Alcohol Consumption: 2-3 times a week     Average Number of Drinks: 5 or 6     Frequency of Binge Drinking: Weekly   Interpersonal Safety: Not on file   Physical Activity: Not on file   Intimate Partner Violence: Not on file   Stress: Not on file   Substance Use: Not on file (12/06/2022)   Social Connections: Not on file   Financial Resource Strain: Medium Risk (12/30/2022)    Received from [facility]    Overall Financial Resource Strain (CARDIA)     Difficulty of Paying Living Expenses: Somewhat hard   Depression: Not at risk (04/19/2022)    PHQ-2     PHQ-2 Score: 0   Health Literacy: Not on file       Would you be willing to receive help  with any of the needs that you have identified today? Not applicable       Dalexa Gentz [facility] Specialty Pharmacist

## 2023-01-05 DIAGNOSIS — C73 Malignant neoplasm of thyroid gland: Principal | ICD-10-CM

## 2023-01-05 NOTE — Unmapped (Signed)
Spoke with patient regarding Lenvima restart. Reassured patient to continue holding Lenvima until in-clinic follow up on 12/12. Patient verbalized understanding and will schedule lab visit prior to clinic visit as well

## 2023-01-09 MED ORDER — INSULIN SYRINGE U-100 WITH NEEDLE 0.3 ML 31 GAUGE X 5/16" (8 MM)
12 refills | 0.00 days
Start: 2023-01-09 — End: ?

## 2023-01-09 MED ORDER — BLOOD-GLUCOSE METER
0 refills | 0.00 days
Start: 2023-01-09 — End: ?

## 2023-01-11 ENCOUNTER — Ambulatory Visit: Admit: 2023-01-11 | Discharge: 2023-01-12 | Payer: PRIVATE HEALTH INSURANCE | Attending: Oncology | Primary: Oncology

## 2023-01-11 DIAGNOSIS — C73 Malignant neoplasm of thyroid gland: Principal | ICD-10-CM

## 2023-01-11 LAB — TSH: THYROID STIMULATING HORMONE: 3.523 u[IU]/mL (ref 0.550–4.780)

## 2023-01-11 LAB — CBC W/ AUTO DIFF
BASOPHILS ABSOLUTE COUNT: 0.1 10*9/L (ref 0.0–0.1)
BASOPHILS RELATIVE PERCENT: 1.2 %
EOSINOPHILS ABSOLUTE COUNT: 0.5 10*9/L (ref 0.0–0.5)
EOSINOPHILS RELATIVE PERCENT: 9.2 %
HEMATOCRIT: 33.7 % — ABNORMAL LOW (ref 39.0–48.0)
HEMOGLOBIN: 10.9 g/dL — ABNORMAL LOW (ref 12.9–16.5)
LYMPHOCYTES ABSOLUTE COUNT: 1.1 10*9/L (ref 1.1–3.6)
LYMPHOCYTES RELATIVE PERCENT: 21 %
MEAN CORPUSCULAR HEMOGLOBIN CONC: 32.3 g/dL (ref 32.0–36.0)
MEAN CORPUSCULAR HEMOGLOBIN: 23.1 pg — ABNORMAL LOW (ref 25.9–32.4)
MEAN CORPUSCULAR VOLUME: 71.6 fL — ABNORMAL LOW (ref 77.6–95.7)
MEAN PLATELET VOLUME: 8.5 fL (ref 6.8–10.7)
MONOCYTES ABSOLUTE COUNT: 0.3 10*9/L (ref 0.3–0.8)
MONOCYTES RELATIVE PERCENT: 5.6 %
NEUTROPHILS ABSOLUTE COUNT: 3.4 10*9/L (ref 1.8–7.8)
NEUTROPHILS RELATIVE PERCENT: 63 %
PLATELET COUNT: 392 10*9/L (ref 150–450)
RED BLOOD CELL COUNT: 4.71 10*12/L (ref 4.26–5.60)
RED CELL DISTRIBUTION WIDTH: 17.6 % — ABNORMAL HIGH (ref 12.2–15.2)
WBC ADJUSTED: 5.5 10*9/L (ref 3.6–11.2)

## 2023-01-11 LAB — SLIDE REVIEW

## 2023-01-11 LAB — COMPREHENSIVE METABOLIC PANEL
ALBUMIN: 3.1 g/dL — ABNORMAL LOW (ref 3.4–5.0)
ALKALINE PHOSPHATASE: 107 U/L (ref 46–116)
ALT (SGPT): 17 U/L (ref 10–49)
ANION GAP: 8 mmol/L (ref 5–14)
BILIRUBIN TOTAL: 0.2 mg/dL — ABNORMAL LOW (ref 0.3–1.2)
BLOOD UREA NITROGEN: 16 mg/dL (ref 9–23)
BUN / CREAT RATIO: 18
CALCIUM: 9.3 mg/dL (ref 8.7–10.4)
CHLORIDE: 99 mmol/L (ref 98–107)
CO2: 27 mmol/L (ref 20.0–31.0)
CREATININE: 0.87 mg/dL (ref 0.73–1.18)
EGFR CKD-EPI (2021) MALE: >90 mL/min/{1.73_m2} (ref >=60)
GLUCOSE RANDOM: 316 mg/dL — ABNORMAL HIGH (ref 70–179)
PROTEIN TOTAL: 7.5 g/dL (ref 5.7–8.2)
SODIUM: 134 mmol/L — ABNORMAL LOW (ref 135–145)

## 2023-01-11 LAB — T4, FREE: FREE T4: 0.98 ng/dL (ref 0.89–1.76)

## 2023-01-11 MED ORDER — INSULIN GLARGINE (U-100) 100 UNIT/ML (3 ML) SUBCUTANEOUS PEN
Freq: Every evening | SUBCUTANEOUS | 2 refills | 83.00 days | Status: CP
Start: 2023-01-11 — End: 2023-01-11
  Filled 2023-01-25: qty 15, 83d supply, fill #0

## 2023-01-11 MED ORDER — METFORMIN ER 500 MG TABLET,EXTENDED RELEASE 24 HR
ORAL_TABLET | Freq: Every day | ORAL | 2 refills | 30.00 days | Status: CP
Start: 2023-01-11 — End: 2023-01-11
  Filled 2023-01-25: qty 30, 30d supply, fill #0

## 2023-01-11 MED ORDER — IBUPROFEN 600 MG TABLET
ORAL_TABLET | Freq: Two times a day (BID) | ORAL | 1 refills | 30 days | Status: CP
Start: 2023-01-11 — End: 2023-01-11
  Filled 2023-01-25: qty 60, 30d supply, fill #0

## 2023-01-11 MED ORDER — HYDROXYZINE HCL 25 MG TABLET
ORAL_TABLET | Freq: Three times a day (TID) | ORAL | 0 refills | 10.00 days | Status: CP | PRN
Start: 2023-01-11 — End: 2023-01-11
  Filled 2023-01-25: qty 30, 10d supply, fill #0

## 2023-01-11 NOTE — Unmapped (Signed)
Clinical Pharmacist Practitioner: [facility]    Patient Name: Paul Hines  Patient Age: 52 y.o.  Encounter Date: 01/11/2023  Primary Oncologist: Ginette Otto, DO, MPH    Reason for visit: oral chemotherapy follow-up    ASSESSMENT & PLAN:  1. Metastatic PTC: patient started on Lenvima on 02/21/21 and he is overall tolerating well with stable fatigue. Last CT scans on 12/20/2022 showed SD. [name] has now been on hold since 12/20/22 when he was admitted for soft tissue infection requiring debridement from which he is still recovering.   Hold Lenvima 18 mg daily until postsurgical/HS wounds are healed   Will order [employer] genetic testing to identify potential targetable mutation given challenges with VEGF and HS    Thyroglobulin, Tumor Marker, IA (ng/mL)   Date Value   12/20/2022 45 (H)   11/03/2022 39 (H)   10/04/2022 46 (H)   08/08/2022 134 (H)   06/20/2022 147 (H)     2. HTN: clinic BP 160/93 today. Not checking BP at home. Reports taking his medications this morning. Upon hospital discharge, he was switched from combination losartan-HCTZ to losartan only and spironolactone and carvedilol were stopped. Given he is not on Lenvima at this time, will continue current regimen and defer further blood pressure titration to FM appt next week.  Continue amlodipine 10 mg daily and losartan 100 mg daily  Consider addition of thiazide, carvedilol and/or spironolactone at [facility] appt  F/u with FM for further BP management    3. Type 2 diabetes: poorly controlled. Hgb A1c POC > 14% on 10/19/22 up from 11.4% on 06/20/22. Since discharge, has not been taking metformin or Lantus. Was started on sliding scale on discharge from [facility] but not given needles for injection so not using. Reports challenges with remembering PM dose of metformin in past. BG today very high at 316 mg/dL but given patient hasn't been using medications will plan to restart metformin and insulin today with further titration by FM.  Start metformin XR 500 mg daily given adherence challenges with IR BID dosing in past  Restart insulin glargine at 18 units daily  F/u with FM for further management    4. Hidrandenitis suppurativa: followed by dermatology. Recently started on bimekizumab injections. Also using clindamycin and rifampin antibiotics after recent soft tissue infection and for prevention of further HS-associated SSTI. Will simplify clindamycin regimen to BID for patient adherence. Having some antibiotic-associated diarrhea, with 2 loose stools/day. Unlikely to be Cdiff due to lack of other symptoms but monitor closely.  Continue bimekizumab injections as instructed and clindamycin 1% cream  Continue rifampin 300 mg BID and change clindamycin to 300 mg BID  Monitor stools given clindamycin use as above  F/u with derm on 02/20/23    5. Postsurgical hypothyroidism: goal for TSH suppression (<0.1). TSH elevated today to 3.523 with normal FT4. Will reassess at next visit as he was previously at goal on this dose.  Continue levothyroxine 300 mcg daily.    6. Peripheral neuropathy: likely 2/2 to T2DM. Stable. Gabapentin discontinued on discharge for unclear reason, will restart today.  Restart gabapentin 300 mg BID    7. Wound site pain: having some pain in groin, relieved with prn ibuprofen and had small supply of oxycodone upon hospital discharge. Encouraged to use ibuprofen instead of oxycodone.  Increase ibuprofen to 600 mg BID    F/U: appt w/ Dr Rosana Hoes in 1 month    ______________________________________________________________________    HPI: Paul Hines is  a 52 y.o. male with history of T2DM, hypertension, ischemic cardiac disease s/p catheterization in 2021 and metastatic thyroid cancer, s/p resection with Dr. Azucena Fallen on 04/12/20 and neck dissection on 07/28/20. Started Lenvima on 02/21/21 although has had issues with noncompliance.     Oral chemotherapy: Lenvima 18 mg daily  Start date: 02/21/21   Specialty pharmacy: [facility]  Copay: $4    Interim History:   - Hospitalized for SSTI from 11/20-11/29 s/p debridement and now on clindamycin + rifampin for streptococcus wound infection and HS; holding lenvatinib since admission  - Reports feeling better since leaving hospital, still having some minor pain associated with wound site and some healing of area   - Feels like the lenvatinib is worsening HS causing flares and hospitalization, would prefer to take a different medication for his PTC  - Had some concerns about being off cancer medication, spent time discussing slow growing nature of cancer  - Taking most medications either differently than prescribed or not at all since leaving hospital  - Having increasing smoking cravings since discharge, not using nicotine patches  - Reports diarrhea twice a day, stools are loose but not super watery, no abdominal cramping associated with it  - see 11/29 discharge note for hospital discharge medication changes    Adherence: on hold since 11/20  Drug-Drug Interactions: none per current med list    Oncology History:  Hematology/Oncology History   Thyroid cancer (CMS-HCC)   03/09/2020 Initial Diagnosis    Thyroid cancer (CMS-HCC)     07/29/2021 -  Cancer Staged    Staging form: Thyroid - Differentiated, AJCC 8th Edition  - Clinical: Stage II (cM1, Age at diagnosis: < 55 years) - Signed by Dionicia Abler, DO on 07/29/2021           Problem List:   Patient Active Problem List   Diagnosis    Abnormal computed tomography angiography (CTA)    Thyroid cancer (CMS-HCC)    Obesity    Right inguinal hernia    Diabetes (CMS-HCC)    Essential hypertension    Hidradenitis suppurativa    Postoperative hypothyroidism       Medications:  Current Outpatient Medications   Medication Sig Dispense Refill    acetaminophen (TYLENOL) 500 MG tablet Take 2 tablets (1,000 mg total) by mouth every six (6) hours as needed for pain. 120 tablet 2    alcohol swabs (ALCOHOL PREP PADS) PadM Use three times a day as needed. 100 each 0 amlodipine (NORVASC) 10 MG tablet Take 1 tablet (10 mg total) by mouth daily. 90 tablet 3    atorvastatin (LIPITOR) 80 MG tablet Take 1 tablet (80 mg total) by mouth nightly. 90 tablet 2    bimekizumab-bkzx 160 mg/mL AtIn Inject the contents of 2 pens (320mg ) every 2 weeks for the first 16 weeks followed by every 4 weeks. 4 mL 4    bimekizumab-bkzx 160 mg/mL AtIn Inject the contents of 2 pens (320mg ) subcutaneous every 4 weeks 2 mL 11    blood sugar diagnostic (GLUCOSE BLOOD) Strp Disp test strips preferred by insurance plan. Testing qday, Dx: E11.9 (Type 2 DM- controlled) 50 strip 11    blood-glucose meter kit Disp. blood glucose meter kit preferred by patient's insurance. Check blood sugars as directed by provider. Dx: Diabetes, E11.9 1 each 11    carvedilol (COREG) 6.25 MG tablet Take 1 tablet (6.25 mg total) by mouth two (2) times a day. 180 tablet 3    cetirizine (ZYRTEC)  10 MG tablet Take 1 tablet (10 mg total) by mouth daily. 30 tablet 2    cetirizine (ZYRTEC) 10 MG tablet Take 1 tablet (10 mg total) by mouth daily. 30 tablet 2    clindamycin (CLEOCIN T) 1 % lotion Apply topically two (2) times a day. 60 mL 11    clindamycin (CLEOCIN) 300 MG capsule Take 1 capsule (300 mg total) by mouth Three (3) times a day.      DEXCOM G7 RECEIVER Misc Use as directed. 1 each 0    DEXCOM G7 SENSOR Devi Apply Dexcom G7 sensor every 10 days. 9 each 3    empty container Misc Use as directed to dispose of Humira pens. 1 each 2    gabapentin (NEURONTIN) 300 MG capsule Take 1 capsule (300 mg total) by mouth two (2) times a day. 180 capsule 2    hydrOXYzine (ATARAX) 25 MG tablet Take 1 tablet (25 mg total) by mouth every eight (8) hours as needed for itching. 30 tablet 0    ibuprofen (MOTRIN) 400 MG tablet Take 1 tablet (400 mg total) by mouth Three (3) times a day as needed for pain. 90 tablet 2    insulin glargine (BASAGLAR, LANTUS) 100 unit/mL (3 mL) injection pen Inject 0.3 mL (30 Units total) under the skin nightly. 15 mL 2 lancets Misc Use to check blood glucose once daily. 100 each 1    lancets Misc Disp. lancets #100 or amount allowed, Testing Qday. Dx: E11.9 (Diabetes- controlled) 100 each 11    lenvatinib (LENVIMA) 18 mg/day (10 mg x 1 AND 4 mg x 2) cap Take one (10mg ) capsule plus two (4mg ) capsules by mouth daily for a total of 18mg  daily. 90 capsule 2    levothyroxine (SYNTHROID) 300 MCG tablet Take 1 tablet (300 mcg total) by mouth daily. 90 tablet 3    losartan-hydroCHLOROthiazide (HYZAAR) 100-25 mg per tablet Take 1 tablet by mouth daily. 90 tablet 3    metFORMIN (GLUCOPHAGE) 500 MG tablet Take 2 tablets (1,000 mg total) by mouth in the morning and 2 tablets (1,000 mg total) in the evening. Take with meals. 360 tablet 2    nicotine (NICODERM CQ) 21 mg/24 hr patch Place 1 patch on the skin daily. Remove old patch before applying new one. 28 patch 2    nicotine polacrilex (NICORETTE) 4 MG gum Chew 1 piece (4 mg total) and park in cheek every hour as needed for smoking cessation. Max is up to 24 pieces/day. 110 each 2    pen needle, diabetic (PEN NEEDLE) 31 gauge x 5/16 (8 mm) Ndle Injection Frequency is 1 time per day 100 each 11    pen needle, diabetic (TRUEPLUS PEN NEEDLE) 32 gauge x 5/32 (4 mm) Ndle Use with insulin glargine once daily 100 each 1    spironolactone (ALDACTONE) 25 MG tablet Take 1 tablet (25 mg total) by mouth daily. 90 tablet 3     No current facility-administered medications for this visit.       Allergies:  Allergies   Allergen Reactions    Penicillin Anaphylaxis    Fish Derived     Lisinopril     Peanut Butter Flavor      Per allergy testing    Penicillins Other (See Comments)     Per patient, when he took at age 19 his legs and arms shook. Not sure if it was seizure or not.     Shellfish Containing Products     Soy  Personal and Social History:   Social History     Tobacco Use    Smoking status: Every Day     Types: Cigarettes     Passive exposure: Current    Smokeless tobacco: Never Substance Use Topics    Alcohol use: Yes   He reports that he does not currently use drugs after having used the following drugs: Marijuana.    Family History:  His family history is not on file.    Review of Systems: A complete review of systems was obtained including: Constitutional, Eyes, ENT, Cardiovascular, Respiratory, GI, GU, Musculoskeletal, Skin, Neurological, Psychiatric, Endocrine, Heme/Lymphatic, and Allergic/Immunologic systems. All other systems reviewed and are negative to the patient???s management except for what was mentioned in the interim history.     Vital Signs: BP 160/93  - Pulse 85  - Temp 37.1 ??C (98.8 ??F) (Temporal)  - Resp 16  - Ht 170.2 cm (5' 7)  - Wt 79.5 kg (175 lb 3.2 oz)  - SpO2 98%  - BMI 27.44 kg/m??     Laboratory Data:  Office Visit on 01/11/2023   Component Date Value    Sodium 01/11/2023 134 (L)     Potassium 01/11/2023      Chloride 01/11/2023 99     CO2 01/11/2023 27.0     Anion Gap 01/11/2023 8     BUN 01/11/2023 16     Creatinine 01/11/2023 0.87     BUN/Creatinine Ratio 01/11/2023 18     eGFR CKD-EPI (2021) Male 01/11/2023 >90     Glucose 01/11/2023 316 (H)     Calcium 01/11/2023 9.3     Albumin 01/11/2023 3.1 (L)     Total Protein 01/11/2023 7.5     Total Bilirubin 01/11/2023 0.2 (L)     AST 01/11/2023      ALT 01/11/2023 17     Alkaline Phosphatase 01/11/2023 107     Free T4 01/11/2023 0.98     TSH 01/11/2023 3.523     WBC 01/11/2023 5.5     RBC 01/11/2023 4.71     HGB 01/11/2023 10.9 (L)     HCT 01/11/2023 33.7 (L)     MCV 01/11/2023 71.6 (L)     MCH 01/11/2023 23.1 (L)     MCHC 01/11/2023 32.3     RDW 01/11/2023 17.6 (H)     MPV 01/11/2023 8.5     Platelet 01/11/2023 392     Neutrophils % 01/11/2023 63.0     Lymphocytes % 01/11/2023 21.0     Monocytes % 01/11/2023 5.6     Eosinophils % 01/11/2023 9.2     Basophils % 01/11/2023 1.2     Absolute Neutrophils 01/11/2023 3.4     Absolute Lymphocytes 01/11/2023 1.1     Absolute Monocytes 01/11/2023 0.3     Absolute Eosinophils 01/11/2023 0.5     Absolute Basophils 01/11/2023 0.1     Microcytosis 01/11/2023 Slight (A)     Anisocytosis 01/11/2023 Slight (A)     Hypochromasia 01/11/2023 Marked (A)     Smear Review Comments 01/11/2023 See Comment (A)     Polychromasia 01/11/2023 Slight (A)     Target Cells 01/11/2023 Moderate (A)     Poikilocytosis 01/11/2023 Moderate (A)         I spent 45 minutes with Paul Hines in direct patient care.    Ernestina Columbia, PharmD  PGY2 Oncology Pharmacy Resident    I was the precepting pharmacist in the delivery of services. I agree with  the plan as documented.    Bobby Rumpf, PharmD, BCOP, CPP  Clinical Pharmacist Practitioner, [facility]

## 2023-01-11 NOTE — Unmapped (Addendum)
Medication Instructions:    For thyroid:  - Take levothryroxine 300 mcg one time each day on an empty stomach    For blood pressure:  - Take amlodipine 10 mg one time each day  - Take losartan 100 mg one time each day    For Diabetes:  - Inject 18 units of insulin glargine every night  - Take metformin 500 mg one time each day    For HS:  - Take clindamycin 300 mg capsules two times a day (antibiotic)  - Take rifampin 300 mg capsules two times a day (antibiotic)  - Inject bimekizumab under the skin as instructed  - Apply clindamycin 1% lotion to affected area two times a day    For pain:  - Take ibuprofen 600 mg two times a day  - Take gabapentin 300 mg two times a day    Other medications:  - Take cetirizine 10 mg one time each day for allergies/itching  - Take hydroxyzine 25 mg every 8 hours as needed for itching  - Apply 21 mg nicotine patch one time each day, use 4 mg gum for additional cravings as needed  - Take atorvastatin 80 mg one time each day for cholesterol

## 2023-01-12 DIAGNOSIS — C73 Malignant neoplasm of thyroid gland: Principal | ICD-10-CM

## 2023-01-12 NOTE — Unmapped (Signed)
Paul Hines is still on hold until post-surgical wound is healed/better. Will follow up in a few weeks.

## 2023-01-12 NOTE — Unmapped (Addendum)
Provided follow-up counseling to Lu Duffel, 52 y.o. for treatment of tobacco use/dependence.     SUMMARY: SW re-assessed pt's tobacco use status and followed up about treatment plan. Pt endorsed current daily tobacco use of 3 cpd. Pt expressed continued interest and motivation in tobacco cessation. Pt continues to use Nicotine Replacement Therapy with success. Pt uses the gum multiple times per day (around 3-5). Pt uses the patch daily with success. PT is proud of the success he has had and is interested in becoming completely tobacco free. SW and pt reviewed psychoeducation regarding behavioral techniques that can be used in tobacco cessation. SW and pt discussed behavioral strategies pt could adopt to help mitigate his urges and reduce his tobacco use (listed below). Pt has been using mindfulness and behavioral friction to assist in cessation attempt. SW made plans to follow up with pt. Pt has SW's number if additional needs arise.    Tobacco Use Treatment  Program: [facility]  Type of Visit: Follow-up  Session Number: 7  Tobacco Use Treatment Visit: Talked with patient  Permission To Engage In Conversation Re: PHI w/ Visitors Present: n/a  Goals Of Session: Insight, increase, Assessment, Communication of feelings, Stress management, increase, Behavior management, improve    Cancer Center Patients  Primary Service: Other (TA)  Primary Cancer Type: Head and Neck    Tobacco Use During Past 30 Days  Time Since Last Tobacco Use: smoked a cigarette today (at least one puff)  Tobacco Withdrawal (Past 24 Hours): None noted  Type of Tobacco Products Used: Cigarettes  Quantity Used: 3  Quantity Per: day  Relight Cigarettes: Yes  Time to First Use After Waking: >60 minutes  Wake During Sleep to Smoke: Never  Other Household Members Use Tobacco: Yes  Smoking Allowed in Home: Yes  Smoking Allowed in Vehicles: N/A  Smoke During Work Day: N/A    Tobacco Use History  Most Recent Attempt: Currently working on  Medications Used in Past Attempts: None    Behavioral Assessment  Why Uses: 1. Long Standing Habit 2. Stress 3. Those around him smoke  Reasons to Become Tobacco Free: 1. Health  Barriers/Challenges: 1. Habit 2. Stress  Strategies: 1. Nicotine patch and gum 2. Mindfulness 3. Behavioral Friction    Treatment Plan  Cessation Meds Currently Using: Gum 4mg , Patch 21mg   Outpatient/Discharge Medications Recommended: Patch 21mg , Gum 4mg   Plan to Obtain Outpatient Meds: Pt already has medication  Patient's Plan Post Discharge/Visit: Plan to quit as soon as possible  Follow-up Plan: Permission given, Phone follow-up scheduled  Family Members Included in Intervention/Plan: No    TTS Information  Diagnosis: Tobacco use disorder, unspecified, uncomplicated (F17.200)  Interventions: Assessed, Discussed, Informed, Motivational interviewing, Supportive therapy, Encouraged  TTS Visit Length: 3-10 minutes    Kenzli Barritt Valentina Lucks, LCSW-A  Tobacco Treatment Counselor  [facility]  [phone]

## 2023-01-16 NOTE — Unmapped (Addendum)
Managed by derm with help from surgery.  Recently admitted for soft tissue infection in his groin with initial concern for necrotizing fasciitis.  He underwent debridement in the OR and now is followed by home health.  Current regimen is below.  He has follow-up early January with both Derm and surgery.    Current plan:    - Referred to Dr Janyth Contes for unroofing procedure consultation (appointment 02/19/22)  - pain clinic appointment 12/30  - Currently getting home health wound care s/p hospitalization  - Continue bimekizumab injections as instructed and clindamycin 1% cream  - Continue rifampin 300 mg BID and change clindamycin to 300 mg BID  - Monitor stools given clindamycin use as above  - F/u with derm on 02/20/23

## 2023-01-16 NOTE — Unmapped (Signed)
[facility]  Established Patient Clinic Note    Assessment/Plan:   Paul Hines is a 52 y.o.male here for follow-up.  Since last visit, he was admitted from 11/29 to 12/18 for soft tissue infection in his groin.  He was able to get Dexcom several days ago but still remains confused on medication regimen since discharge.  We spent a fair amount of time going through medications again today with a hard copy of medications given.    Assessment & Plan  Type 2 diabetes mellitus without complication, with long-term current use of insulin (CMS-HCC)    Poorly controlled diabetes.  A1c done while admitted was marginally better at 13.6 compared to > 14.0 in September.  He still taking Lantus inconsistently.  Thinks 2-3 times a week on average.  He was able to get Dexcom with 3-day average 168.  7 and 14-day unavailable.  Due to hospital admission, he missed his appointment with Pharm.D.  Will plan to get him reestablished to help with polypharmacy.     Repeat A1c due in February 2025.   Due to thyroid cancer he is not a good candiate for GLP1. Will plan to add a SGLT2 once A1C is <10.     Will plan to follow up in 3-4 weeks. Continue to follow with pharmD.      Current Medications:    --Metformin 500mg  2 tabs BID -- taking 2 in the morning but none at night  --Lantus 15u daily -- per patient varies between 15-30u. Has not been taking consistently   --Atorvastatin 80 mg daily -  --amlodipine 10mg    --losartan- hydrochlorothiazide 100/25     Medication changes today:   --increase lantus to 20u daily. Encouraged him to do this consistently      DMII prev med:   --Foot exam - 10/19/2022 UTD- deficits on big toes bilaterally. No major callous or ulcers. Skin intact  --Eyes- DR screening referral placed on 10/19/2022  --labs -   --A1C 12/21/2022 13.6.;  A1C >14 on 10/19/2022; Next Due Feb 2025  --Microalb/Cr (10/19/2022): 33.2; on ARB  --BP (goal <130/80)-  above goal, see below for discussion  --ASCVD risk (q84yrs if well controlled): elevated. On statin, unsure compliance          Essential hypertension  Patient with long standing HTN with elevated BP today.  Unclear if he is taking all of his medication as ordered.  Again, we had a good discussion regarding adherence to medication regimen and updated med list printed out for patient today.   I do still have reservations about increasing or changing his medication as I feel his poor blood pressure control may stem from lack of compliance.  I still have reservations about adding or changing medications at this time and I feel the majority of his poor control is due to lack of adherence.  We again reviewed medications and handout given with regimen.     Regimen prescribed:   --amlodipine 10mg  -  --losartan- hydrochlorothiazide 100/25  --spironolactone 25mg  daily      Previous medications no longer taking:  --Carvedilol 6.5mg  BID  -- consider restarting if he remains elevated.            Hidradenitis suppurativa  Managed by derm with help from surgery.  Recently admitted for soft tissue infection in his groin with initial concern for necrotizing fasciitis.  He underwent debridement in the OR and now is followed by home health.  Current regimen is below.  He has follow-up early January with both Derm and surgery.    Current plan:    - Referred to Dr Janyth Contes for unroofing procedure consultation (appointment 02/19/22)  - pain clinic appointment 12/30  - Currently getting home health wound care s/p hospitalization  - Continue bimekizumab injections as instructed and clindamycin 1% cream  - Continue rifampin 300 mg BID and change clindamycin to 300 mg BID  - Monitor stools given clindamycin use as above  - F/u with derm on 02/20/23      Thyroid cancer (CMS-HCC)  patient started on Lenvima on 02/21/21 and he is overall tolerating well with stable fatigue. Last CT scans on 12/20/2022 showed SD. [name] has now been on hold since 12/20/22 when he was admitted for soft tissue infection requiring debridement from which he is still recovering.               Attending: Karl Ito, MD     Subjective   Paul Hines is a 52 y.o. male  coming to clinic today for the following issues:    Chief Complaint   Patient presents with    Follow-up     Procedure on inside of thighs     HPI:     He was recently admitted to [facility] secondary to a soft tissue infection in his groin with initial concerns for necrotizing fasciitis.  He was taken to the OR for debridement and washout.  He was subsequently admitted from 11/29 to 12/18.  Since discharge she has been seen by home health.  Still having mild acute groin pain.  But overall doing better.  He has home health coming twice a week to help with bandage and wound checks.  He has seen dermatology and started on since discharge.    He still remains slightly confused about his medication regimen.  We spent a fair amount of time today going through medication list and comparing new recommendations since discharge.  He was able to get his Dexcom since last visit but only recently started wearing it.    I have reviewed the problem list, medications, and allergies and have updated/reconciled them if needed.    Paul Hines  reports that he has been smoking cigarettes. He has been exposed to tobacco smoke. He has never used smokeless tobacco.  Health Maintenance   Topic Date Due    Retinal Eye Exam  Never done    Colon Cancer Screening  Never done    Zoster Vaccines (2 of 2) 12/29/2022    Hemoglobin A1c  03/23/2023    Foot Exam  10/19/2023    Urine Albumin/Creatinine Ratio  10/19/2023    Potassium Monitoring  12/29/2023    Serum Creatinine Monitoring  01/11/2024    DTaP/Tdap/Td Vaccines (6 - Td or Tdap) 11/02/2032    Pneumococcal Vaccine 0-64  Completed    Hepatitis C Screen  Completed    COVID-19 Vaccine  Completed    Influenza Vaccine  Completed       Objective     VITALS: BP 150/92  - Pulse 98  - Temp 36.6 ??C (97.9 ??F) (Temporal)  - Ht 170.2 cm (5' 7)  - Wt 78.8 kg (173 lb 12.8 oz)  - BMI 27.22 kg/m??     Physical Exam  Constitutional:       Appearance: Normal appearance. He is normal weight.   HENT:      Head: Normocephalic and atraumatic.      Nose: Nose normal.  Mouth/Throat:      Mouth: Mucous membranes are moist.   Eyes:      Extraocular Movements: Extraocular movements intact.   Cardiovascular:      Rate and Rhythm: Normal rate.   Pulmonary:      Effort: Pulmonary effort is normal.   Skin:     General: Skin is warm.      Capillary Refill: Capillary refill takes less than 2 seconds.   Neurological:      General: No focal deficit present.      Mental Status: He is alert and oriented to person, place, and time. Mental status is at baseline.   Psychiatric:         Mood and Affect: Mood normal.         LABS/IMAGING  Office Visit on 01/11/2023   Component Date Value    Sodium 01/11/2023 134 (L)     Potassium 01/11/2023      Chloride 01/11/2023 99     CO2 01/11/2023 27.0     Anion Gap 01/11/2023 8     BUN 01/11/2023 16     Creatinine 01/11/2023 0.87     BUN/Creatinine Ratio 01/11/2023 18     eGFR CKD-EPI (2021) Male 01/11/2023 >90     Glucose 01/11/2023 316 (H)     Calcium 01/11/2023 9.3     Albumin 01/11/2023 3.1 (L)     Total Protein 01/11/2023 7.5     Total Bilirubin 01/11/2023 0.2 (L)     AST 01/11/2023      ALT 01/11/2023 17     Alkaline Phosphatase 01/11/2023 107     Free T4 01/11/2023 0.98     TSH 01/11/2023 3.523     WBC 01/11/2023 5.5     RBC 01/11/2023 4.71     HGB 01/11/2023 10.9 (L)     HCT 01/11/2023 33.7 (L)     MCV 01/11/2023 71.6 (L)     MCH 01/11/2023 23.1 (L)     MCHC 01/11/2023 32.3     RDW 01/11/2023 17.6 (H)     MPV 01/11/2023 8.5     Platelet 01/11/2023 392     Neutrophils % 01/11/2023 63.0     Lymphocytes % 01/11/2023 21.0     Monocytes % 01/11/2023 5.6     Eosinophils % 01/11/2023 9.2     Basophils % 01/11/2023 1.2     Absolute Neutrophils 01/11/2023 3.4     Absolute Lymphocytes 01/11/2023 1.1     Absolute Monocytes 01/11/2023 0.3 Absolute Eosinophils 01/11/2023 0.5     Absolute Basophils 01/11/2023 0.1     Microcytosis 01/11/2023 Slight (A)     Anisocytosis 01/11/2023 Slight (A)     Hypochromasia 01/11/2023 Marked (A)     Smear Review Comments 01/11/2023 See Comment (A)     Polychromasia 01/11/2023 Slight (A)     Target Cells 01/11/2023 Moderate (A)     Poikilocytosis 01/11/2023 Moderate (A)    Hospital Outpatient Visit on 12/20/2022   Component Date Value    Creatinine Whole Blood, * 12/20/2022 1.1     eGFR CKD-EPI (2021) Male 12/20/2022 80    Appointment on 12/20/2022   Component Date Value    Sodium 12/20/2022 136     Potassium 12/20/2022 4.2     Chloride 12/20/2022 102     CO2 12/20/2022 26.0     Anion Gap 12/20/2022 8     BUN 12/20/2022 15     Creatinine 12/20/2022 1.03     BUN/Creatinine Ratio 12/20/2022  15     eGFR CKD-EPI (2021) Male 12/20/2022 86     Glucose 12/20/2022 285 (H)     Calcium 12/20/2022 9.4     Albumin 12/20/2022 3.0 (L)     Total Protein 12/20/2022 7.9     Total Bilirubin 12/20/2022 0.2 (L)     AST 12/20/2022 16     ALT 12/20/2022 11     Alkaline Phosphatase 12/20/2022 96     THYROGLOBULIN AB 12/20/2022 <1.8     Free T4 12/20/2022 1.98 (H)     TSH 12/20/2022 0.251 (L)     WBC 12/20/2022 6.9     RBC 12/20/2022 5.12     HGB 12/20/2022 11.8 (L)     HCT 12/20/2022 36.5 (L)     MCV 12/20/2022 71.2 (L)     MCH 12/20/2022 22.9 (L)     MCHC 12/20/2022 32.2     RDW 12/20/2022 15.7 (H)     MPV 12/20/2022 8.9     Platelet 12/20/2022 300     Neutrophils % 12/20/2022 64.1     Lymphocytes % 12/20/2022 22.2     Monocytes % 12/20/2022 10.4     Eosinophils % 12/20/2022 2.8     Basophils % 12/20/2022 0.5     Absolute Neutrophils 12/20/2022 4.4     Absolute Lymphocytes 12/20/2022 1.5     Absolute Monocytes 12/20/2022 0.7     Absolute Eosinophils 12/20/2022 0.2     Absolute Basophils 12/20/2022 0.0     Microcytosis 12/20/2022 Slight (A)     Hypochromasia 12/20/2022 Marked (A)     Smear Review Comments 12/20/2022 See Comment (A) Thyroglobulin, Tumor Mar* 12/20/2022 45 (H)     Thyroglubulin, IA, Interp 12/20/2022 SEE COMMENTS        [facility]  [place] [facility] [place] [place] [facility]  [address] [place], [place] [address]  Telephone [phone]  Fax [phone]  [email or web address]

## 2023-01-16 NOTE — Unmapped (Addendum)
Patient with long standing HTN with elevated BP today.  Unclear if he is taking all of his medication as ordered.  Again, we had a good discussion regarding adherence to medication regimen and updated med list printed out for patient today.   I do still have reservations about increasing or changing his medication as I feel his poor blood pressure control may stem from lack of compliance.  I still have reservations about adding or changing medications at this time and I feel the majority of his poor control is due to lack of adherence.  We again reviewed medications and handout given with regimen.     Regimen prescribed:   --amlodipine 10mg  -  --losartan- hydrochlorothiazide 100/25  --spironolactone 25mg  daily      Previous medications no longer taking:  --Carvedilol 6.5mg  BID  -- consider restarting if he remains elevated.

## 2023-01-16 NOTE — Unmapped (Addendum)
Poorly controlled diabetes.  A1c done while admitted was marginally better at 13.6 compared to > 14.0 in September.  He still taking Lantus inconsistently.  Thinks 2-3 times a week on average.  He was able to get Dexcom with 3-day average 168.  7 and 14-day unavailable.  Due to hospital admission, he missed his appointment with Pharm.D.  Will plan to get him reestablished to help with polypharmacy.     Repeat A1c due in February 2025.   Due to thyroid cancer he is not a good candiate for GLP1. Will plan to add a SGLT2 once A1C is <10.     Will plan to follow up in 3-4 weeks. Continue to follow with pharmD.      Current Medications:    --Metformin 500mg  2 tabs BID -- taking 2 in the morning but none at night  --Lantus 15u daily -- per patient varies between 15-30u. Has not been taking consistently   --Atorvastatin 80 mg daily -  --amlodipine 10mg    --losartan- hydrochlorothiazide 100/25     Medication changes today:   --increase lantus to 20u daily. Encouraged him to do this consistently      DMII prev med:   --Foot exam - 10/19/2022 UTD- deficits on big toes bilaterally. No major callous or ulcers. Skin intact  --Eyes- DR screening referral placed on 10/19/2022  --labs -   --A1C 12/21/2022 13.6.;  A1C >14 on 10/19/2022; Next Due Feb 2025  --Microalb/Cr (10/19/2022): 33.2; on ARB  --BP (goal <130/80)-  above goal, see below for discussion  --ASCVD risk (q37yrs if well controlled): elevated. On statin, unsure compliance

## 2023-01-16 NOTE — Unmapped (Addendum)
patient started on Lenvima on 02/21/21 and he is overall tolerating well with stable fatigue. Last CT scans on 12/20/2022 showed SD. [name] has now been on hold since 12/20/22 when he was admitted for soft tissue infection requiring debridement from which he is still recovering.

## 2023-01-17 ENCOUNTER — Ambulatory Visit: Admit: 2023-01-17 | Discharge: 2023-01-18 | Payer: PRIVATE HEALTH INSURANCE

## 2023-01-17 NOTE — Unmapped (Addendum)
Current Medications:   Diabetes:    --Metformin 500mg  2 tabs twice a day (morning and night)   --Lantus 20u daily   --gabapentin 300 mg tablet twice a day (AM and PM)  --watch sugars. If over 250 on dexcom consistently send me a message    Cholesterol  --Atorvastatin 80 mg daily     Blood pressure:   --amlodipine 10mg  daily   --losartan- hydrochlorothiazide 100/25  daily   --spironolactone 25mg  daily     Hidradenitis (skin infections)  --bimekizumab injections 160 mg/mL Atin, Inject the contents of 2 pens (320mg ) every 2 weeks for the first 16 weeks followed by every 4 weeks.  --rifampin 300 mg twice daily (AM and PM)   --clindamycin to 300 mg twice daily (AM and PM)  --clindamycin (CLEOCIN T) 1 % lotion; Apply topically two (2) times a day.  --certirizine 10mg  daily   -- Hibiclens wash qday in shower    Thyroid medication:   --levothyroxine 300 mcg daily       Follow up apt:   Pain management on Jan 29 2023 at 1000 with Dr. Solon Palm MD at [facility]    02/14/2023 3:40 PM Sheth, Valora Corporal, DO [facility] [place]     02/20/2023 9:00 AM Elsie Stain, MD [facility]       01/29/2023 10:00 AM Karma Greaser, MD

## 2023-01-18 NOTE — Unmapped (Signed)
[facility] Refill Coordination Note    Specialty Medication(s) to be Shipped:   Inflammatory Disorders: Bimzelx    Other medication(s) to be shipped:  ibuprofen , lantus , metformin , zyrtec , nicotine , pen needles and hydroxyzine     Paul Hines, DOB: 1970/11/27  Phone: [phone] (home)       All above HIPAA information was verified with patient.     Was a Nurse, learning disability used for this call? No    Completed refill call assessment today to schedule patient's medication shipment from the [facility]  [phone]).  All relevant notes have been reviewed.     Specialty medication(s) and dose(s) confirmed: Regimen is correct and unchanged.   Changes to medications: [name] reports no changes at this time.  Changes to insurance: No  New side effects reported not previously addressed with a pharmacist or physician: None reported  Questions for the pharmacist: No    Confirmed patient received a Conservation officer, historic buildings and a Surveyor, mining with first shipment. The patient will receive a drug information handout for each medication shipped and additional FDA Medication Guides as required.       DISEASE/MEDICATION-SPECIFIC INFORMATION        For patients on injectable medications: Patient currently has 0 doses left.  Next injection is scheduled for 01/24/2023.    SPECIALTY MEDICATION ADHERENCE     Medication Adherence    Patient reported X missed doses in the last month: 0  Specialty Medication: BIMZELX AUTOINJECTOR 160 mg/mL Atin (bimekizumab-bkzx)  Patient is on additional specialty medications: No              Were doses missed due to medication being on hold? No    Bimzelx 160 mg/ml: 0 doses of medicine on hand       REFERRAL TO PHARMACIST     Referral to the pharmacist: Not needed      SHIPPING     Shipping address confirmed in Epic.       Delivery Scheduled: Yes, Expected medication delivery date: 01/19/2023.     Medication will be delivered via Same Day Courier to the prescription address in Epic WAM.    Quintella Reichert   [facility]  Specialty Technician

## 2023-01-19 NOTE — Unmapped (Signed)
WARFIELD FICKE 's [facility] AUTOINJECTOR 160 mg/mL Atin (bimekizumab-bkzx) shipment will be delayed as a result of the medication is too soon to refill until 01/25/23.     I have reached out to the patient  at [phone]  and communicated the delay. We will wait for a call back from the patient to reschedule the delivery.  We have not confirmed the new delivery date.

## 2023-01-19 NOTE — Unmapped (Signed)
Paul Hines 's Bimzelex shipment will be rescheduled as a result of the medication is too soon to refill until 12/26.     I have spoken with the patient  via incoming phone call and communicated the delivery change. We will reschedule the medication for the delivery date that the patient agreed upon.  We have confirmed the delivery date as 12/26

## 2023-01-25 MED FILL — BIMZELX AUTOINJECTOR 160 MG/ML SUBCUTANEOUS AUTO-INJECTOR: 28 days supply | Qty: 2 | Fill #0

## 2023-01-25 MED FILL — NICOTINE (POLACRILEX) 4 MG GUM: BUCCAL | 5 days supply | Qty: 110 | Fill #1

## 2023-01-25 MED FILL — ULTICARE PEN NEEDLE 32 GAUGE X 5/32" (4 MM): 90 days supply | Qty: 100 | Fill #0

## 2023-01-28 NOTE — Unmapped (Unsigned)
Department of Anesthesiology  [facility]  [address]  [place], [place] [address]  [phone]      Date: January 28, 2023  Patient Name: [facility]  MRN: [id number]  PCP: [facility]  Referring Provider: Ailene Ravel, MD    Assessment:   Attending: Bradin [facility] is a 52 y.o. male with a PMHx of Bilateral shoulder pain, Diabetic polyneuropathy, Type 2 diabetes mellitus, and Essential hypertension. He is being [facility] for pain associated with Thyroid Cancer surgical site.    Per chart review, patient is being referred by PCP for ongoing surgical site pain in the neck. He is currently being Followed by [facility]. While treatment is palliative, his overall life expectancy seems fairly good if compliant on medications ([place]).     Today, ***    The patient completed the Opioid Risk Tool (ORT) to help identify risk related to opiate abuse and/or misuse (Health and Behavior Assessment-15 mins). This measure assesses risk factors for opiate abuse and/or misuse in adults with chronic pain, with empirically validated risk factors including: Family history of substance abuse, personal history of substance abuse (alcohol, illicit drugs, prescription drugs), age, history of preadolescent sexual abuse, and psychological comorbidity (depression, other mental health comorbidity).     -The patient scored 0-3 on the ORT indicating low risk for future abuse.  -The patient scored 4-7 on the ORT indicating moderate risk for opiate abuse.  -The patient scored 8+ on the ORT indicating a high risk for opiate abuse.      Patient {DOES /DOES VHQ:46962} appear to be utilizing pain medications appropriately and {DOES /DOES XBM:84132} report that the medications do improve patient's quality of life and functionality level.  At today's visit, the patient reports {ajlmedresponse:47259} analgesia from their current medication regimen {with/without:43712} significant adverse effects.    Current Pain Provider: {YES/NO/OTHER:21266}  Urine toxicology: {None:21267}  Urine toxicology screen {loboncurine:46577} appropriate.   Last Opioid Change: {loboncoption:45525}  Last EKG: {ajlna:57045}  Previous Compliance Issues: {None:21267}  Naloxone ordered: {ajlyesnoother:47260}  Total morphine equivalents: ***  Benzodiazepine: {YES/NO:21013}  Pain Psychology: {ajlpainpsych:59727::N/A}  [place] DOC: {None:21267}  [place] database {was/was GMW:10272} reviewed 01/28/23.      No diagnosis found.    General Recommendations: The pain condition that the patient suffers from is best treated with a multidisciplinary approach that involves an increase in physical activity to prevent de-conditioning and worsening of the pain cycle, as well as psychological counseling (formal and/or informal) to address the co-morbid psychological affects of pain.  Treatment will often involve judicious use of pain medications and interventional procedures to decrease the pain, allowing the patient to participate in the physical activity that will ultimately produce long-lasting pain reductions.  The goal of the multidisciplinary approach is to return the patient to a higher level of overall function and to restore their ability to perform activities of daily living.      Plan:     ***    Future Considerations:  ***    -No follow-ups on file.    No orders of the defined types were placed in this encounter.    Requested Prescriptions      No prescriptions requested or ordered in this encounter         Subjective:     HPI:  Paul [facility] is seen in consultation at the request of Paul Ravel, MD  For evaluation and  recommendations regarding His chronic pain.     Today, ***    Pain Clinic? {ajlpainclinic:50590}    Current Medications:  Gabapentin 300 mg BID  Ibuprofen 600 mg BID      His pain started approximately  {NUMBERS:20191} {TIME; DAYS-YEARS:20939} ago.  His pain involving {dljright:27057} {AMB PAIN 1:25272}.    His pain is described as {ajlpaindescription:47264}  [facility] is  presenting to the clinic today with pain that is graded as {numbers 0-10:5044}/10 in intensity. His highest level of pain is reported as {numbers 0-10:5044}/10 in intensity, least pain level is {0 - 10:19007}/10 and average pain level is {numbers 0-10:5044}/10.  His pain started {painstart:34630}.  His pain symptoms {Actions; are/are not:16769} associated with {PAIN [phone]}.  The patient {HAS HAS BJY:78295} experienced lost of bowel or bladder control.  His pain is made worse with {PAIN WORSE AOZH:[id number]}.  His pain is present {painpresent:34631}   His pain is improved with {PAIN IMPROVEMENT:[phone]}.     Previous interventions include {ajlprevioustrials:51236}    Previous tests include {ajltests:45132}  Workmans Compensation {HRT options:36140} involved.  This {HRT options:36140} involved with a lawsuit or lawyer  The treatment goals include {ajlgoals:45133}    Previous Medication Trials: {ajlmeds:45134}    Past Medical History:   Diagnosis Date    Abnormal findings on dx imaging of heart and cor circ     Acne vulgaris     Allergic contact dermatitis due to food in contact with skin     Atherosclerosis     Athscl heart disease of native coronary artery w/o ang pctrs     Chronic ischemic heart disease     Cutaneous abscess     Diabetic polyneuropathy (CMS-HCC)     Disorder of skin and subcutaneous tissue     Essential hypertension     H/O medication noncompliance     Hernia, inguinal, unilateral     Hypertrophic cardiomyopathy (CMS-HCC)     Malignant neoplasm of thyroid gland (CMS-HCC)     Presbyopia     Pseudofolliculitis barbae     Shoulder pain, bilateral     Tinea unguium     Type 2 diabetes mellitus (CMS-HCC)     Xerosis cutis      Past Surgical History:   Procedure Laterality Date    PR BRNCHSC EBUS GUIDED SAMPL 1/2 NODE STATION/STRUX N/A 01/09/2020 Procedure: BRONCH, RIGID OR FLEXIBLE, INC FLUORO GUIDANCE, WHEN PERFORMED; WITH EBUS GUIDED TRANSTRACHEAL AND/OR TRANSBRONCHIAL SAMPLING, ONE OR TWO MEDIASTINAL AND/OR HILAR LYMPH NODE STATIONS OR STRUCTURES;  Surgeon: Jerelyn Charles, MD;  Location: MAIN OR [facility];  Service: Pulmonary    PR BRNSCHSC TNDSC EBUS DX/TX INTERVENTION PERPH LES Right 01/09/2020    Procedure: BRONCH, RIGID OR FLEXIBLE, INCLUDING FLUORO GUIDANCE, WHEN PERFORMED; WITH TRANSENDOSCOPIC EBUS DURING BRONCHOSCOPIC DIAGNOSTIC OR THERAPEUTIC INTERVENTION(S) FOR PERIPHERAL LESION(S);  Surgeon: Jerelyn Charles, MD;  Location: MAIN OR [facility];  Service: Pulmonary    PR BRONCHOSCOPY,COMPUTER ASSIST/IMAGE-GUIDED NAVIGATION Right 01/09/2020    Procedure: ROBOT ION BRONCHOSCOPY,RIGID OR FLEXIBLE,INCLUDE FLUORO WHEN PERFORMED; W/COMPUTER-ASSIST,IMAGE-GUIDED NAVIGATION;  Surgeon: Jerelyn Charles, MD;  Location: MAIN OR [facility];  Service: Pulmonary    PR BRONCHOSCOPY,DIAGNOSTIC W LAVAGE Right 01/09/2020    Procedure: BRONCHOSCOPY, RIGID OR FLEXIBLE, INCLUDE FLUOROSCOPIC GUIDANCE WHEN PERFORMED; W/BRONCHIAL ALVEOLAR LAVAGE;  Surgeon: Jerelyn Charles, MD;  Location: MAIN OR ;  Service: Pulmonary    PR BRONCHOSCOPY,TRANSBRON ASPIR BX Right 01/09/2020    Procedure: BRONCHOSCOPY, RIGID/FLEX, INCL FLUORO; W/TRANSBRONCH NDL ASPIRAT BX, TRACHEA, MAIN STEM &/OR LOBAR BRONCHUS;  Surgeon: Jerelyn Charles, MD;  Location: [facility];  Service: Pulmonary    PR BRONCHOSCOPY,TRANSBRONCH BIOPSY Right 01/09/2020    Procedure: BRONCHOSCOPY, RIGID/FLEXIBLE, INCLUDE FLUORO GUIDANCE WHEN PERFORMED; W/TRANSBRONCHIAL LUNG BX, SINGLE LOBE;  Surgeon: Jerelyn Charles, MD;  Location: MAIN OR [facility];  Service: Pulmonary    PR CATH PLACE/CORON ANGIO, IMG SUPER/INTERP,W LEFT HEART VENTRICULOGRAPHY N/A 08/26/2019    Procedure: Left Heart Catheterization;  Surgeon: Marlaine Hind, MD;  Location: [facility] CATH;  Service: Cardiology    PR LIGATN THOR DUCT,CERV APPROACH Bilateral 07/28/2020    Procedure: SUTURE &/OR LIG THORACIC DUCT; CERV APPROACH;  Surgeon: Lauralee Evener, MD;  Location: MAIN OR [facility];  Service: ENT    PR REMOVAL NODES, NECK,CERV MOD RAD Bilateral 07/28/2020    Procedure: CERVICAL LYMPHADENECTOMY (MODIFIED RADICAL NECK DISSECTION);  Surgeon: Lauralee Evener, MD;  Location: MAIN OR [facility];  Service: ENT    PR THYROIDECTOMY,MALIG,LTD NECK SURG Bilateral 04/12/2020    Procedure: THYROIDECTOMY, TOTAL OR SUBTOTAL FOR MALIGNANCY; WITH LIMITED NECK DISSECTION;  Surgeon: Lauralee Evener, MD;  [facility];  Service: ENT     Family History   Problem Relation Age of Onset    Melanoma Neg Hx     Basal cell carcinoma Neg Hx     Squamous cell carcinoma Neg Hx        Social History:  He reports that he has been smoking cigarettes. He has been exposed to tobacco smoke. He has never used smokeless tobacco. He reports current alcohol use. He reports that he does not currently use drugs after having used the following drugs: Marijuana.    The patient is {loboncmarital:42395}  The patient has children: {ajlchildren:43507}  The patient is {loboncliving:42396}  Highest level of education: {loboncschool:42397}  Current Employment: {loboncjob:42399}  Occupation: ***  Exercise: ***  Recreational Drugs: {YES NO [id number]  Treatment for Substance abuse: {YES NO L6539673  Use anothers prescription medications: {YES NO GNFAO:13086}    Allergies as of 01/29/2023 - Reviewed 01/18/2023   Allergen Reaction Noted    Penicillin Anaphylaxis 11/17/2021    Fish derived  03/09/2020    Lisinopril  03/09/2020    Peanut butter flavor  04/12/2020    Penicillins Other (See Comments) 07/08/2019    Shellfish containing products  03/09/2020    Soy  03/09/2020      Current Outpatient Medications   Medication Sig Dispense Refill    acetaminophen (TYLENOL) 500 MG tablet Take 2 tablets (1,000 mg total) by mouth every six (6) hours as needed for pain. 120 tablet 2 alcohol swabs (ALCOHOL PREP PADS) PadM Use three times a day as needed. 100 each 0    amlodipine (NORVASC) 10 MG tablet Take 1 tablet (10 mg total) by mouth daily. 90 tablet 3    atorvastatin (LIPITOR) 80 MG tablet Take 1 tablet (80 mg total) by mouth nightly. 90 tablet 2    bimekizumab-bkzx 160 mg/mL AtIn Inject the contents of 2 pens (320mg ) every 2 weeks for the first 16 weeks followed by every 4 weeks. 4 mL 4    bimekizumab-bkzx 160 mg/mL AtIn Inject the contents of 2 pens (320mg ) subcutaneous every 4 weeks 2 mL 11    blood sugar diagnostic (GLUCOSE BLOOD) Strp Disp test strips preferred by insurance plan. Testing qday, Dx: E11.9 (Type 2 DM- controlled) 50 strip 11    blood-glucose meter kit Disp. blood glucose meter kit preferred by patient's insurance. Check blood sugars as directed by provider. Dx: Diabetes,  E11.9 1 each 11    blood-glucose meter Misc Use as directed 1 each 0    cetirizine (ZYRTEC) 10 MG tablet Take 1 tablet (10 mg total) by mouth daily. 30 tablet 2    clindamycin (CLEOCIN T) 1 % lotion Apply topically two (2) times a day. 60 mL 11    clindamycin (CLEOCIN) 300 MG capsule Take 1 capsule (300 mg total) by mouth two (2) times a day.      DEXCOM G7 RECEIVER Misc Use as directed. 1 each 0    DEXCOM G7 SENSOR Devi Apply Dexcom G7 sensor every 10 days. 9 each 3    empty container Misc Use as directed to dispose of Humira pens. 1 each 2    gabapentin (NEURONTIN) 300 MG capsule Take 1 capsule (300 mg total) by mouth two (2) times a day. 180 capsule 2    hydrOXYzine (ATARAX) 25 MG tablet Take 1 tablet (25 mg total) by mouth every eight (8) hours as needed for itching. 30 tablet 0    ibuprofen (MOTRIN) 600 MG tablet Take 1 tablet (600 mg total) by mouth two (2) times a day. 60 tablet 1    insulin glargine (BASAGLAR, LANTUS) 100 unit/mL (3 mL) injection pen Inject 0.18 mL (18 Units total) under the skin nightly. 15 mL 2    insulin syringe-needle U-100 0.3 mL 31 gauge x 5/16 (8 mm) Syrg Use as directed 90 each 12    lancets Misc Use to check blood glucose once daily. 100 each 1    lancets Misc Disp. lancets #100 or amount allowed, Testing Qday. Dx: E11.9 (Diabetes- controlled) 100 each 11    levothyroxine (SYNTHROID) 300 MCG tablet Take 1 tablet (300 mcg total) by mouth daily. 90 tablet 3    losartan (COZAAR) 100 MG tablet Take 1 tablet (100 mg total) by mouth daily at 0600.      metFORMIN (GLUCOPHAGE-XR) 500 MG 24 hr tablet Take 1 tablet (500 mg total) by mouth daily with evening meal. 30 tablet 2    nicotine (NICODERM CQ) 21 mg/24 hr patch Place 1 patch on the skin daily. Remove old patch before applying new one. 28 patch 2    nicotine polacrilex (NICORETTE) 4 MG gum Chew 1 piece (4 mg total) and park in cheek every hour as needed for smoking cessation. Max is up to 24 pieces/day. 110 each 2    pen needle, diabetic (PEN NEEDLE) 31 gauge x 5/16 (8 mm) Ndle Injection Frequency is 1 time per day 100 each 11    pen needle, diabetic (TRUEPLUS PEN NEEDLE) 32 gauge x 5/32 (4 mm) Ndle Use with insulin glargine once daily 100 each 1    rifAMPin (RIFADIN) 300 MG capsule Take 1 capsule (300 mg total) by mouth two (2) times a day.       No current facility-administered medications for this visit.       Imaging/Tests:     CT Neck Soft Tissue W Contrast 12/20/2022  FINDINGS:   The visualized portions of the brain and the posterior fossa are normal. Smooth-walled, fat-containing ovoid lesion within the left posterior scalp subcutaneous tissues is favored to be a benign lipoma and is similar in size when compared to 05/30/2022.      The paranasal sinuses are normal. There is mucosal thickening/debris within the left sphenoid sinus. The orbits are normal. The nasal cavity and nasopharynx are normal.      The oropharynx and oral cavity are normal. The parapharyngeal spaces  are clear. The salivary glands are normal.      Postsurgical appearance of bilateral cervical neck dissection, thoracic duct ligation and total thyroidectomy. No evidence of residual or recurrent disease within the thyroidectomy bed.      New rounded soft tissue densities in the visceral space of the neck near the right brachiocephalic confluence, measuring up to 1.1 cm (4:69), possibly reflecting paratracheal nodes. Evaluation of this area is limited due to artifact from contrast injection.      No bone abnormality is demonstrated. Multiple pulmonary nodules. Please see same-day dedicated CT chest for characterization of findings below the clavicles.      Normal intravascular enhancement is seen.     Impression      New rounded likely soft tissue densities in the visceral space of the neck between the trachea and esophagus that could represent recurrent nodal disease. However, evaluation is limited due to beam artifact from the contrast injection.      No other evidence of new metastatic or recurrent disease.      Multiple pulmonary nodules. Please see same-day dedicated CT chest for characterization of findings below the clavicles.       Lab Results   Component Value Date    CREATININE 0.87 01/11/2023       Lab Results   Component Value Date    ALKPHOS 107 01/11/2023    BILITOT 0.2 (L) 01/11/2023    PROT 7.5 01/11/2023    ALBUMIN 3.1 (L) 01/11/2023    ALT 17 01/11/2023    AST  01/11/2023      Comment:      Specimen hemolyzed.       Lab Results   Component Value Date    PLT 392 01/11/2023       Lab Results   Component Value Date    A1C >14.0 (H) 10/19/2022       Urine toxiciology screen  No results found for: AMPHU, BARBU, BENZU, CANNAU, METHU, OPIAU, COCAU    OPIOID CONFIRMATION:  No results found for: CDIFFTOX, NBUPR, LABCO, HYDROCODONE, HYDROMORPH, MORPHINE, OXYCODONE, OXMU, MAMU, OPIU     BENZODIAZEPINE CONFIRMATION:  No results found for: CDIFFTOX, OHALP, CLONU, HDXYFLRAZUR, 7NHFLU, DESALKYCONF, LORAZURQT, HDXYTRIAZUR, MIDAZURQT, BNZU    Review of Systems:  GENERAL: {nikaROSgen:23990}  HEENT: {NIKAROSENT:24068}  SKIN:{nikaROSskin:23995}  PULMONARY:{nikarospulm:24070}  ENDOCRINE: {nikaROSendoc:23997}  GASTROINTESTINAL:{nikaROSGI:23994:a}  GENITOURINARY:{nikarosgu:24073:a}  MUSCULOSKELETAL:{nikaROSmusculosk:23999:a}  CARDIOVASCULAR:{nikaROSCV:23991:a}  NEUROLOGIC:{nikaROSneur:24000:a}  PSYCHIATRIC: {nikaROSpsych:24001:a}  He {AMB Pain denies/reports:[phone]} any homicidal or suicidal ideation.   HEMATOLOGIC: {nikaroshema:24075:a}  IMMUNOLOGIC: {nikarosimmuno:24076:a}      Objective:     PHYSICAL EXAM:  There were no vitals taken for this visit.  Wt Readings from Last 3 Encounters:   01/17/23 78.8 kg (173 lb 12.8 oz)   01/11/23 79.5 kg (175 lb 3.2 oz)   12/20/22 78.1 kg (172 lb 3.2 oz)     GENERAL: Well developed, well-nourished {loboncobese:42394} and is in {BLANK NUUVOZ:36644} distress. The patient is pleasant and interactive. Patient is a good historian.  No evidence of sedation or intoxication.   HEENT: Normocephalic/atraumatic.   CARDIOVASCULAR:  {ajlcardiacexam:61906}  RESPIRATORY:  {ajlpulmexam:61907::Normal work of breathing, no supplemental 02}  EXTREMITIES: No clubbing, cyanosis noted.    GASTROINTESTINAL: Soft, nondistended  NEUROLOGIC:  Alert and oriented, speech fluent, normal language. Cranial nerves grossly intact.  Sensation {ajlneuroexam:62877}. Reflexes were {Reflexes:140032} and {were/were not:54765} equal in upper and lower extremities  MUSCULOSKELETAL:  Motor function  {Pain Motor Numbers:[phone]::5/5} in {ajlupperlower:48905::upper,lower} extremities. Patient rises from a seated position with {ajlambulate3:46823::no} difficulty.  The patient was able to ambulate with {ajlambulate3:46823::no} difficulty throughout the clinic today {With/without:5700} the assistance of a walking aid-{ajlcane:46931::None}.     SPINE:The patient {DOES /DOES QVZ:56387} have pain with palpation of the {Cervical Thoracic Lumbar:47568} spine.  {AMB Pain Normal/Abnormal:[phone]::Normal} flexion and {AMB Pain Normal/Abnormal:[phone]::Normal} extension at the waist. {AMB Pain Positive/Negative:([phone]} pain on facet loading procedures.Surgical scaring {HRT options:36140} noted on spine. Patrick's maneuver {AMB Pain Positive/[phone]}{Right/left:16020}. Straight leg raising {AMB Pain Positive/Negative:(540)150-0829} {Right/left:16020}.   SKIN: No obvious rashes lesions or erythema on the exposed skin  PSYCHIATRIC:Appropriate, full range affect, no psychomotor retardation

## 2023-01-29 ENCOUNTER — Ambulatory Visit: Admit: 2023-01-29 | Payer: PRIVATE HEALTH INSURANCE

## 2023-01-31 NOTE — Unmapped (Signed)
Subjective     Reason for visit:    Paul Hines is a 53 y.o. 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  Patient presents to this visit alone.    Known DM Complications: peripheral neuropathy    Date of Last Diabetes Related Visit: 11/13/22 with CPP Karalee Height, 01/11/23 with onc CPP, 01/17/23 with PCP    Action At Last Diabetes Related Visit:    Follow-up after recent admission at [facility] (11/20-11/29) for SSTI in bilateral inguinal region, initial c/f Fournier's gangrene but later confirmed no e/o necrotizing infection, started on treatment with clindamycin + rifampin per dermatology  Pt discharged from hospital on sliding scale insulin but not given needles for injection so not using and also reports difficulty remembering PM dose of metformin IR  Restart metformin XR 500 mg daily  Restart insulin glargine 18 units daily - later increased to 20 units daily per PCP  Pt able to get Dexcom CGM and just started using in mid-December    Since Last visit / History of Present Illness:    Patient reports fully implementing plan from last visit. Brought med bottles to visit today to review. Restarted metformin as instructed and is now up to 2 tablets daily, although has bottles of both IR and XR and believes he more often takes IR tabs. Confirms increasing insulin dose but thought he was recommended to take BID so has been doing that several days over the past week but admits he forgot PM dose at least a few days in the past week so only took insulin once daily some days.    Currently smoking 1/2 PPD. Tries to use nicotine gum when he thinks of it. Not really wearing patch but has on hand.    Reported HTN Regimen:  Amlodipine 10 mg daily  Losartan-HCTZ 100-25 mg daily  Carvedilol 6.25 mg BID  Spironolactone 25 mg daily    Home BP Monitoring:  Does not check BP at home. Denies s/sx orthostasis. Denies s/sx elevated BP, including headaches or stroke sx. Endorses some blurry vision when reading at times, though unsure if related to BP or BG.    Reported DM Regimen:    Metformin IR 1000 mg qAM (also has XR tablets but not taking recently)  Lantus 20 units BID for past ~1 week - maybe 4 days in the past week took BID, all days at least 1 dose    DM medications tried in the past:   Glipizide  Insulin NPH    Medication Adherence and Access:  Since last visit, patient reports missing 2nd dose of insulin (note: pt thought insulin was intended to be BID since last PCP visit as stated above) ~3 days in the past week, otherwise has gotten at least 1 dose of insulin every day. Also does think he has missed some doses of rifampin and clindamycin, though unsure how many or how often.    Does not currently use a pillbox. Has never been informed about pill packs previously but would be open to this in the future, not ready to make this switch today (since would need to change pharmacies).    Biggest reason for missing doses of meds is due to simply forgetting to take. Phone does have alarm capabilities but doesn't think this would help. Tries to have routine of taking meds  right away in AM after waking.    Has not taken any meds yet this AM as he was running behind to get to clinic appointment.    Has pill bottles of both atorvastatin 40 mg + 80 mg tablets, will take only 1 of them but dose depends on which one he happens to see first that day. Also has pill bottles for carvedilol 6.25 mg tablets and spironolactone 25 mg tablets, which were previously thought to be not in current regimen as pt did not have bottles for these at past visits.    SMBG Per patient memory:    Using Dexcom G7 CGM as instructed (new as of mid-December 2024). Forgot CGM Receiver at home today so reports glucose values from memory.    For past ~2 weeks:  This AM: 150  Average AM: 130-150  Highest: 300s - CGM alarms for >250, almost every night at bedtime  Lowest: 126    Hypoglycemia:    Symptoms of hypoglycemia since last visit:  no  If yes, it was treated by: n/a    DM Prevention:  Statin: Taking; high intensity (atorvastatin 80 mg)  Aspirin - unclear if indicated; Not taking    ACEI/ARB - Taking (losartan 100 mg daily - in combo pill w/ HCTZ 25 mg); Urine MA/CR Ratio - elevated urinary albumin excretion of 33.2 ug/g (last checked 10/19/22).  Last eye exam: unknown - DUE  Last foot exam: 10/19/22   Tobacco Use: Current smoker  Immunizations:   Immunization History   Administered Date(s) Administered    COVID-19 VAC,BIVALENT,MODERNA(BLUE CAP) 11/09/2020    COVID-19 VACCINE,MRNA(MODERNA)(PF) 03/06/2019, 04/03/2019, 12/18/2019, 06/08/2020    Covid-19 Vac, (41yr+) (Comirnaty) Mrna Pfizer  10/19/2022    DTaP, Unspecified Formulation 12/13/1971, 02/09/1972, 04/26/1972, 10/03/1973    INFLUENZA VACCINE IIV3(IM)(PF)6 MOS UP 10/19/2022    Influenza Vaccine Quad(IM)6 MO-Adult(PF) 12/02/2020    Influenza Virus Vaccine, unspecified formulation 11/03/2020    Measles 12/13/1971    Mumps 03/28/1974    Pneumococcal Conjugate 20-valent 11/03/2022    Polio Virus Vaccine, Unspecified Formulation 12/13/1971, 02/09/1972, 04/26/1972, 10/03/1973    Rubella 12/13/1971    SHINGRIX-ZOSTER VACCINE (HZV),RECOMBINANT,ADJUVANTED(IM) 11/03/2022    TdaP 11/03/2022     _________________________________________________    Past Medical History: reviewed PMH in epic today    Social History:  Social History     Tobacco Use   Smoking Status Every Day    Types: Cigarettes    Passive exposure: Current   Smokeless Tobacco Never       Medications: Medications reviewed in EPIC medication station and updated today by the clinical Insurance underwriter.         Objective   Vitals:    Vitals:    02/01/23 0852   BP: 157/97   Pulse: 86        Wt Readings from Last 3 Encounters:   01/17/23 78.8 kg (173 lb 12.8 oz) 01/11/23 79.5 kg (175 lb 3.2 oz)   12/20/22 78.1 kg (172 lb 3.2 oz)       There is no height or weight on file to calculate BMI.    The 10-year ASCVD risk score (Arnett DK, et al., 2019) is: 41%    Values used to calculate the score:      Age: 55 years      Sex: Male      Is Non-Hispanic African American: Yes      Diabetic: Yes      Tobacco smoker: Yes  Systolic Blood Pressure: 157 mmHg      Is BP treated: Yes      HDL Cholesterol: 71 mg/dL      Total Cholesterol: 261 mg/dL    Note: For patients with SBP <90 or >200, Total Cholesterol <130 or >320, HDL <20 or >100 which are outside of the allowable range, the calculator will use these upper or lower values to calculate the patient???s risk score.      Labs:   Lab Results   Component Value Date    A1C >14.0 (H) 10/19/2022    A1C 11.4 (H) 06/20/2022    A1C 9.8 (H) 02/01/2022     Per CareEverywhere: 13.6% (12/21/22)      Assessment/Plan:    1. Diabetes, type 2: uncontrolled per last A1c 13.6% (12/21/22 per CareEverywhere), which is slightly improved from >14% in Sept 2024. Goal <7% per ADA guidelines. Unable to assess CGM data since pt forget CGM Receiver at home, but per pt report, appears still persistent hyperglycemia with fasting glucose values typically above goal 80-130 and nearly daily having glucose readings up into the 300s later in the day, which is well above top of goal range <180. Somewhat difficult to know how to adjust insulin since pt misunderstood instructions and has been taking total daily dose of 40 units about half the days in the past week and 20 units the other half of the days, therefore, will attempt to meet somewhere in the middle for now to see how glucose readings steady out and reviewed he really does only need to give basal insulin once daily given 24-hour duration of action. To continued to work towards improved BG control, will also titrate metformin further and encouraged pt to stick w/ XR tabs so he can take the full dose at the same time.  Change Lantus insulin to 30 units once daily   Increase metformin XR from 1000 to 1500 mg daily  Repeat A1c due 03/23/23  Reviewed symptoms and treatment of hypoglycemia, including need to check BG prior to and following treatment.  SMBG instructions:  use Dexcom G7 CGM (including receiver) as instructed (change sensors every 10 days)  Future considerations:  Caution w/ SGLT2i given recent SSTI in inguinal region and uncontrolled HS affecting inguinal region (also potentially higher risk of GU infections with significantly elevated A1c)  Could still consider GLP-1 RA given non-medullary thyroid carcinomas not a contraindication and have unclear association - also potential to improve CKD/CV-related outcomes given microalbuminuria present (if SGLT2i not pursued)  If challenges with injections, then possibly TZD (e.g. pioglitazone) - reasonable as long as no CHF or bladder cancer history    2. Hypertension: uncontrolled based on today's clinic BP of 157/97 (HR 86), although pt has not yet taken any meds (including BP meds) today. Goal <130/80 mmHg per ADA guidelines. Difficult to assess BP control on current med regimen since pt did not take meds before visit today. Therefore, will defer any med changes today but did update med list with carvedilol and spironolactone that were previously thought to have been self-discontinued. Discussed several strategies for helping to improve med adherence as well, which I suspect is playing a large role in poor BP and BG control.  Continue amlodipine 10 mg daily  Continue losartan-HCTZ 100 mg daily  Continue spironolactone 25 mg daily  Continue carvedilol 6.25 mg BID  Future considerations:  Longer-acting ARB (e.g. irbesartan, telmisartan, olmesartan) and/or thiazide (e.g. chlorthalidone or indapamide) if concern for wearing off effect  3. Non-obstructive CAD - ASCVD risk reduction: last LDL elevated at 145 (10/19/22). Goal LDL <70 given DM + ASCVD risk >7.5% (as well as notable history of non-obstructive CAD). Appropriately on high intensity statin, though unclear adherence. May need additional non-statin LDL lowering therapies in the future but agreed to defer for now with focus on consistent adherence to current statin regimen. Briefly discussed smoking cessation as well and encouraged pt to trial nicotine patch in addition to PRN use of nicotine gum to help reduce cigarette cravings throughout the day and work towards complete cessation.  Continue atorvastatin 80 mg daily  Recommended pt start using nicotine 21 mg/24h patch more consistently + continue nicotine 4 mg gum PRN cravings  Future considerations:  Add PO ezetimibe if LDL remains above goal despite confirmed adherence w/ max dose statin    4. Medication management and care coordination:  Discarded pill bottles for metformin IR and atorvastatin 40 mg tablets and recommend pt use metformin XR and atorvastatin 80 mg tablets exclusively.  Sent message to [facility] with request to transfer Rx's for clindamycin + rifampin from [facility] to streamline which pharmacy pt will get refills from going forward.  Encouraged pt to schedule eye exam, appears ophtho referral previously placed and pt had appt but did not show so explained he should be able to call and reschedule. Provided pt with phone # for [facility] ophtho.  Given continued concerns with med adherence and management, would strongly consider pursuing pill packs. Confirmed with [facility] they would be able to provide pill packs and deliver to pt at no additional charge. Per pt preference, deferred making this transition now but would continue to revisit with pt at future follow-up visits.  Created and printed out med action plan for patient that includes names of all meds, dosing, and indications as listed below:  Name of Medication Morning Evening What It's For   Amlodipine  10 mg tablet 1 tablet  High blood pressure   Atorvastatin  80 mg tablet 1 tablet  High cholesterol  Prevent future heart attack and stroke   Carvedilol  6.25 mg tablet 1 tablet 1 tablet High blood pressure   Cetirizine  10 mg tablet 1 tablet  Allergies and itching   Clindamycin  300 mg capsule 1 capsule 1 capsule Skin infection   Rifampin  300 mg capsule 1 capsule 1 capsule Skin infection   Levothyroxine  300 mcg tablet 1 tablet  Thyroid supplement   Losartan-HCTZ  100-25 mg tablet 1 tablet  High blood pressure   Lantus insulin Inject 30 units once daily. Diabetes   Metformin XR  500 mg tablet 3 tablets  Diabetes   Spironolactone  25 mg tablet 1 tablet  High blood pressure   Bimzelx injection Inject 2 pens (320 mg) every 2 weeks for 16 weeks. Hidradenitis suppurativa   As Needed Medications   Hydroxyzine  25 mg tablet Take 1 tablet (25 mg) by mouth every 8 hours as needed. Itching   Ibuprofen  600 mg tablet Take 1 tablet (600 mg) by mouth twice daily as needed. Pain   Gabapentin  300 mg capsule Take 1 capsule (300 mg) by mouth twice daily. Pain   Nicotine  21 mg/24 hour patch Place 1 patch on the skin daily. Decrease cigarette cravings   Nicotine  4 mg gum Chew and park 1 piece of gum in cheek every 1 hour as needed. Cigarette cravings       Follow-up:  end of January with PCP as scheduled and ~2-3 weeks after that with CPP    Future Appointments   Date Time Provider Department Center   02/09/2023  1:30 PM Eliezer Mccoy, SLP [facility]   02/09/2023  1:30 PM [facility]O RM 7 IFLUOUW [place]   02/14/2023  1:00 PM IC CT RM 2 I[facility] [place] [facility]   02/14/2023  2:30 PM ADULT ONC LAB [facility]   02/14/2023  3:40 PM Sheth, Siddharth Hemant, DO [facility]   02/20/2023  9:00 AM Elsie Stain, MD DERMMRKT [facility]   02/28/2023  1:40 PM Vernelle Emerald, DO [facility]   03/22/2023 10:00 AM Melba Coon, CPP [facility]   04/04/2023 12:30 PM ADULT ONC LAB [facility]   04/04/2023  1:40 PM Sheth, Siddharth Hemant, DO [facility]       I spent a total of 40 minutes face to face with the patient delivering clinical care and providing education/counseling.  _________________________________________________  Damita Dunnings, PharmD, CPP, [facility]  Family Medicine Clinical Pharmacist

## 2023-01-31 NOTE — Unmapped (Addendum)
Call [facility] for medication refills:  [phone] (option #4)    Schedule eye exam at [facility] eye center:  [facility]MOLOGY [address] [place]  Phone number: [phone]    Diabetes medications:  Lantus insulin 30 units once daily  Metformin XR 3 tablets (1500 mg) once daily - INCREASE!    How often and when to check blood sugar:  Use Dexcom G7 continuous glucose monitor (CGM) as instructed. Change sensors every 10 days.    How to treat low blood sugar:  For blood sugar less than 70 --> Treat with 4 ounces of juice or regular soda, or with 3 to 4 glucose tablets. Re-check blood sugar in 15 minutes.    If blood sugar is still less than 70 on re-check, treat again and re-check in 15 minutes.   If blood sugar is over 70 on re-check and it is time to eat your regular meal, eat regular meal and take insulin as prescribed for that blood sugar.    When to follow-up:  Thursday, February 20th at 10am with Tobi Bastos (pharmacist)      Damita Dunnings, PharmD, CPP, [facility]  Family Medicine Clinical Pharmacist  Clinic Phone: [phone]      Name of Medication Morning Evening What It's For   Amlodipine  10 mg tablet 1 tablet  High blood pressure   Atorvastatin  80 mg tablet 1 tablet  High cholesterol  Prevent future heart attack and stroke   Carvedilol  6.25 mg tablet 1 tablet 1 tablet High blood pressure   Cetirizine  10 mg tablet 1 tablet  Allergies and itching   Clindamycin  300 mg capsule 1 capsule 1 capsule Skin infection   Rifampin  300 mg capsule 1 capsule 1 capsule Skin infection   Levothyroxine  300 mcg tablet 1 tablet  Thyroid supplement   Losartan-HCTZ  100-25 mg tablet 1 tablet  High blood pressure   Lantus insulin Inject 30 units once daily. Diabetes   Metformin XR  500 mg tablet 3 tablets  Diabetes   Spironolactone  25 mg tablet 1 tablet  High blood pressure   Bimzelx injection Inject 2 pens (320 mg) every 2 weeks for 16 weeks. Hidradenitis suppurativa   As Needed Medications Hydroxyzine  25 mg tablet Take 1 tablet (25 mg) by mouth every 8 hours as needed. Itching   Ibuprofen  600 mg tablet Take 1 tablet (600 mg) by mouth twice daily as needed. Pain   Gabapentin  300 mg capsule Take 1 capsule (300 mg) by mouth twice daily. Pain   Nicotine  21 mg/24 hour patch Place 1 patch on the skin daily. Decrease cigarette cravings   Nicotine  4 mg gum Chew and park 1 piece of gum in cheek every 1 hour as needed. Cigarette cravings

## 2023-02-01 ENCOUNTER — Ambulatory Visit
Admit: 2023-02-01 | Discharge: 2023-02-02 | Payer: PRIVATE HEALTH INSURANCE | Attending: Ambulatory Care | Primary: Ambulatory Care

## 2023-02-01 MED ORDER — METFORMIN ER 500 MG TABLET,EXTENDED RELEASE 24 HR
ORAL_TABLET | Freq: Every day | ORAL | 2 refills | 90.00 days | Status: CP
Start: 2023-02-01 — End: ?
  Filled 2023-03-19: qty 270, 90d supply, fill #0

## 2023-02-01 MED ORDER — INSULIN GLARGINE (U-100) 100 UNIT/ML (3 ML) SUBCUTANEOUS PEN
Freq: Every evening | SUBCUTANEOUS | 2 refills | 50.00 days | Status: CP
Start: 2023-02-01 — End: ?
  Filled 2023-03-19: qty 15, 50d supply, fill #0

## 2023-02-08 NOTE — Unmapped (Signed)
Provided follow-up counseling to Paul Hines, 53 y.o. for treatment of tobacco use/dependence.     SUMMARY: SW re-assessed pt's tobacco use status and followed up about treatment plan. Pt endorsed currently daily tobacco use of 3 cpd. Pt expressed continued interest and motivation in tobacco cessation. Pt shared he is doing well and feeling good about his quit attempt. Pt is in need of more patches. SW called pharmacy to have prescription refilled. Pt continues to use behavioral strategies to assist (listed below). Pt is not experiences side effects or nicotine withdrawal. Pt appreciates the continued support from the [facility] TTP team. SW made plans to follow up with pt. Pt has SW's number if additional needs arise.    Tobacco Use Treatment  Program: [facility]  Type of Visit: Follow-up  Session Number: 8  Tobacco Use Treatment Visit: Talked with patient  Permission To Engage In Conversation Re: PHI w/ Visitors Present: n/a  Goals Of Session: Insight, increase, Assessment, Communication of feelings, Problem-solving skill development, Behavior management, improve    Cancer Center Patients  Primary Service: Other (TA)  Primary Cancer Type: Head and Neck    Tobacco Use During Past 30 Days  Time Since Last Tobacco Use: smoked a cigarette today (at least one puff)  Tobacco Withdrawal (Past 24 Hours): None noted  Type of Tobacco Products Used: Cigarettes  Quantity Used: 3  Quantity Per: day  Relight Cigarettes: Yes  Time to First Use After Waking: >60 minutes  Wake During Sleep to Smoke: Never  Other Household Members Use Tobacco: Yes  Smoking Allowed in Home: Yes  Smoking Allowed in Vehicles: N/A  Smoke During Work Day: N/A    Tobacco Use History  Most Recent Attempt: Currently working on  Medications Used in Past Attempts: None    Behavioral Assessment  Why Uses: 1. Long Standing Habit 2. Stress 3. Those around him smoke  Reasons to Become Tobacco Free: 1. Health  Barriers/Challenges: 1. Habit 2. Stress  Strategies: 1. Nicotine patch and gum 2. Mindfulness 3. Behavioral Friction    Treatment Plan  Cessation Meds Currently Using: Gum 4mg , Patch 21mg   Outpatient/Discharge Medications Recommended: Gum 4mg , Patch 21mg   Plan to Obtain Outpatient Meds: Pt already has medication  Patient's Plan Post Discharge/Visit: Plan to quit as soon as possible  Follow-up Plan: Permission given, Phone follow-up scheduled  Family Members Included in Intervention/Plan: No    TTS Information  Diagnosis: Tobacco use disorder, unspecified, uncomplicated (F17.200)  Interventions: Assessed, Discussed, Informed, Motivational interviewing, Suggested, Encouraged  TTS Visit Length: 3-10 minutes    Kari Kerth Valentina Lucks, LCSW-A  Tobacco Treatment Counselor  [facility] Tobacco Treatment Program  [phone]

## 2023-02-09 ENCOUNTER — Ambulatory Visit: Admit: 2023-02-09 | Discharge: 2023-02-09 | Payer: PRIVATE HEALTH INSURANCE

## 2023-02-09 ENCOUNTER — Inpatient Hospital Stay
Admit: 2023-02-09 | Discharge: 2023-02-09 | Payer: PRIVATE HEALTH INSURANCE | Attending: Speech-Language Pathologist | Primary: Speech-Language Pathologist

## 2023-02-09 ENCOUNTER — Inpatient Hospital Stay: Admit: 2023-02-09 | Discharge: 2023-02-09 | Payer: PRIVATE HEALTH INSURANCE

## 2023-02-09 MED ADMIN — barium sulfate 40 % (W/V) oral powder: 15 mL | ORAL | @ 20:00:00 | Stop: 2023-02-09

## 2023-02-09 MED FILL — NICOTINE 21 MG/24 HR DAILY TRANSDERMAL PATCH: TRANSDERMAL | 28 days supply | Qty: 28 | Fill #1

## 2023-02-09 NOTE — Unmapped (Signed)
Paul Hines came to the [facility] for assistance with setting up his [employer] account. This RN helped him call in and submit application for a new phone which should arrive in about 2 weeks.

## 2023-02-12 NOTE — Unmapped (Signed)
[facility] ADULT SPEECH THERAPY [place]  OUTPATIENT SPEECH PATHOLOGY  02/09/2023      Patient Name: Paul Hines  Date of Birth:1970/12/09  Session Number: 1  Diagnosis: metastatic thyroid cancer     General:  Date of Evaluation: 02/09/23  Referred by: Doneen Poisson ANP  Reason for Referral: MBSS    Assessment   Patient seated in exam chair in upright position in NAD presented with pur??e consistency, thin liquid, solid consistency trials.  Fluoroscopy views lateral and AP.  Patient demonstrated essentially normal oropharyngeal swallowing function.  He did show mild pharyngeal onset delay versus posterior spillage manifesting as silent laryngeal penetration to the level of ventricular vocal folds.  Subsequent swallowing clear this material from vestibule.  All other swallows with all other consistencies showed is normal but with mild pharyngeal onset delay is noted.  Thin liquid consistency laryngeal penetration times 1 out of 2 presented boluses with approximately the same volume.  Patient then excepted 5 to 15 mm boluses of pur??e consistency with no laryngeal penetration or aspiration or significant residue.  Additionally, solid food mastication was normal with masticated solid food pharyngeal trigger at vallecula with no laryngeal penetration or aspiration.  AP view showed significant slowing of boluses through cervical region of the esophagus through 2 aortic arch.  Barium swallow study would be recommended for  further exploration and characterization.  No further targets.     Recommendations:  Recommendations: PO Diet  Diet Liquids Recommendations: No Restrictions  Diet Solids Recommendation: Regular Consistency Solids  Recommended Form of Medications:  (preference)   Recommended Compensatory Techniques : Upright 90 degrees, Effortful swallow smaller single thin liquid sips    Follow-up Referral Recommendations : Follow-up modified barium swallow (Monitor for increase in symptoms.)  Recommendations: PO Diet Subjective:  Patient with complaint of difficulty swallowing primarily solid foods and pills. He is currnently under medical care for stage 4 metastatic thyroid cancer being treated with lenvatinib Patient has a history of uncontrolled diabetes mellitus.Referring oncologist, Marshell Garfinkel ANP.    Communication Preference: Verbal    Barriers to Learning: No Barriers      Prior treatment for referral reason: No    Pain?: No   Precautions: None    Prior Function: Independent      Past Medical History:   Diagnosis Date    Abnormal findings on dx imaging of heart and cor circ     Acne vulgaris     Allergic contact dermatitis due to food in contact with skin     Atherosclerosis     Athscl heart disease of native coronary artery w/o ang pctrs     Chronic ischemic heart disease     Cutaneous abscess     Diabetic polyneuropathy (CMS-HCC)     Disorder of skin and subcutaneous tissue     Essential hypertension     H/O medication noncompliance     Hernia, inguinal, unilateral     Hypertrophic cardiomyopathy (CMS-HCC)     Malignant neoplasm of thyroid gland (CMS-HCC)     Presbyopia     Pseudofolliculitis barbae     Shoulder pain, bilateral     Tinea unguium     Type 2 diabetes mellitus (CMS-HCC)     Xerosis cutis       Family History   Problem Relation Age of Onset    Melanoma Neg Hx     Basal cell carcinoma Neg Hx     Squamous cell carcinoma Neg Hx  Past Surgical History:   Procedure Laterality Date    PR BRNCHSC EBUS GUIDED SAMPL 1/2 NODE STATION/STRUX N/A 01/09/2020    Procedure: BRONCH, RIGID OR FLEXIBLE, INC FLUORO GUIDANCE, WHEN PERFORMED; WITH EBUS GUIDED TRANSTRACHEAL AND/OR TRANSBRONCHIAL SAMPLING, ONE OR TWO MEDIASTINAL AND/OR HILAR LYMPH NODE STATIONS OR STRUCTURES;  Surgeon: Jerelyn Charles, MD;  Location: MAIN OR [place];  Service: Pulmonary    PR BRNSCHSC TNDSC EBUS DX/TX INTERVENTION PERPH LES Right 01/09/2020    Procedure: BRONCH, RIGID OR FLEXIBLE, INCLUDING FLUORO GUIDANCE, WHEN PERFORMED; WITH TRANSENDOSCOPIC EBUS DURING BRONCHOSCOPIC DIAGNOSTIC OR THERAPEUTIC INTERVENTION(S) FOR PERIPHERAL LESION(S);  Surgeon: Jerelyn Charles, MD;  Location: MAIN OR [place];  Service: Pulmonary    PR BRONCHOSCOPY,COMPUTER ASSIST/IMAGE-GUIDED NAVIGATION Right 01/09/2020    Procedure: ROBOT ION BRONCHOSCOPY,RIGID OR FLEXIBLE,INCLUDE FLUORO WHEN PERFORMED; W/COMPUTER-ASSIST,IMAGE-GUIDED NAVIGATION;  Surgeon: Jerelyn Charles, MD;  Location: MAIN OR [place];  Service: Pulmonary    PR BRONCHOSCOPY,DIAGNOSTIC W LAVAGE Right 01/09/2020    Procedure: BRONCHOSCOPY, RIGID OR FLEXIBLE, INCLUDE FLUOROSCOPIC GUIDANCE WHEN PERFORMED; W/BRONCHIAL ALVEOLAR LAVAGE;  Surgeon: Jerelyn Charles, MD;  Location: MAIN OR [place];  Service: Pulmonary    PR BRONCHOSCOPY,TRANSBRON ASPIR BX Right 01/09/2020    Procedure: BRONCHOSCOPY, RIGID/FLEX, INCL FLUORO; W/TRANSBRONCH NDL ASPIRAT BX, TRACHEA, MAIN STEM &/OR LOBAR BRONCHUS;  Surgeon: Jerelyn Charles, MD;  Location: MAIN OR ;  Service: Pulmonary    PR BRONCHOSCOPY,TRANSBRONCH BIOPSY Right 01/09/2020    Procedure: BRONCHOSCOPY, RIGID/FLEXIBLE, INCLUDE FLUORO GUIDANCE WHEN PERFORMED; W/TRANSBRONCHIAL LUNG BX, SINGLE LOBE;  Surgeon: Jerelyn Charles, MD;  Location: MAIN OR [facility];  Service: Pulmonary    PR CATH PLACE/CORON ANGIO, IMG SUPER/INTERP,W LEFT HEART VENTRICULOGRAPHY N/A 08/26/2019    Procedure: Left Heart Catheterization;  Surgeon: Marlaine Hind, MD;  Location: [facility] CATH;  Service: Cardiology    PR LIGATN THOR DUCT,CERV APPROACH Bilateral 07/28/2020    Procedure: SUTURE &/OR LIG THORACIC DUCT; CERV APPROACH;  Surgeon: Lauralee Evener, MD;  Location: MAIN OR [facility];  Service: ENT    PR REMOVAL NODES, NECK,CERV MOD RAD Bilateral 07/28/2020    Procedure: CERVICAL LYMPHADENECTOMY (MODIFIED RADICAL NECK DISSECTION);  Surgeon: Lauralee Evener, MD;  Location: MAIN OR [facility];  Service: ENT    PR THYROIDECTOMY,MALIG,LTD NECK SURG Bilateral 04/12/2020    Procedure: THYROIDECTOMY, TOTAL OR SUBTOTAL FOR MALIGNANCY; WITH LIMITED NECK DISSECTION;  Surgeon: Lauralee Evener, MD;  Location: MAIN OR [facility];  Service: ENT      Allergies   Allergen Reactions    Penicillin Anaphylaxis    Fish Derived     Lisinopril     Peanut Butter Flavor      Per allergy testing    Penicillins Other (See Comments)     Per patient, when he took at age 1 his legs and arms shook. Not sure if it was seizure or not.     Shellfish Containing Products     Soy      Social History     Tobacco Use    Smoking status: Every Day     Types: Cigarettes     Passive exposure: Current    Smokeless tobacco: Never   Substance Use Topics    Alcohol use: Yes      Current Outpatient Medications   Medication Sig Dispense Refill    alcohol swabs (ALCOHOL PREP PADS) PadM Use three times a day as needed. 100 each 0    amlodipine (NORVASC) 10 MG tablet Take 1 tablet (10 mg total) by mouth daily. 90 tablet  3    atorvastatin (LIPITOR) 80 MG tablet Take 1 tablet (80 mg total) by mouth nightly. 90 tablet 2    bimekizumab-bkzx 160 mg/mL AtIn Inject the contents of 2 pens (320mg ) every 2 weeks for the first 16 weeks followed by every 4 weeks. 4 mL 4    bimekizumab-bkzx 160 mg/mL AtIn Inject the contents of 2 pens (320mg ) subcutaneous every 4 weeks 2 mL 11    blood sugar diagnostic (GLUCOSE BLOOD) Strp Disp test strips preferred by insurance plan. Testing qday, Dx: E11.9 (Type 2 DM- controlled) 50 strip 11    blood-glucose meter kit Disp. blood glucose meter kit preferred by patient's insurance. Check blood sugars as directed by provider. Dx: Diabetes, E11.9 1 each 11    carvedilol (COREG) 6.25 MG tablet Take 1 tablet (6.25 mg total) by mouth two (2) times a day.      cetirizine (ZYRTEC) 10 MG tablet Take 1 tablet (10 mg total) by mouth daily. 30 tablet 2    clindamycin (CLEOCIN T) 1 % lotion Apply topically two (2) times a day. 60 mL 11    clindamycin (CLEOCIN) 300 MG capsule Take 1 capsule (300 mg total) by mouth two (2) times a day.      DEXCOM G7 RECEIVER Misc Use as directed. 1 each 0    DEXCOM G7 SENSOR Devi Apply Dexcom G7 sensor every 10 days. 9 each 3    empty container Misc Use as directed to dispose of Humira pens. 1 each 2    gabapentin (NEURONTIN) 300 MG capsule Take 1 capsule (300 mg total) by mouth two (2) times a day. 180 capsule 2    hydrOXYzine (ATARAX) 25 MG tablet Take 1 tablet (25 mg total) by mouth every eight (8) hours as needed for itching. 30 tablet 0    ibuprofen (MOTRIN) 600 MG tablet Take 1 tablet (600 mg total) by mouth two (2) times a day. 60 tablet 1    insulin glargine (BASAGLAR, LANTUS) 100 unit/mL (3 mL) injection pen Inject 0.3 mL (30 Units total) under the skin nightly. 15 mL 2    insulin syringe-needle U-100 0.3 mL 31 gauge x 5/16 (8 mm) Syrg Use as directed 90 each 12    lancets Misc Disp. lancets #100 or amount allowed, Testing Qday. Dx: E11.9 (Diabetes- controlled) 100 each 11    levothyroxine (SYNTHROID) 300 MCG tablet Take 1 tablet (300 mcg total) by mouth daily. 90 tablet 3    losartan-hydroCHLOROthiazide (HYZAAR) 100-25 mg per tablet Take 1 tablet by mouth daily.      metFORMIN (GLUCOPHAGE-XR) 500 MG 24 hr tablet Take 3 tablets (1,500 mg total) by mouth daily with evening meal. 270 tablet 2    nicotine (NICODERM CQ) 21 mg/24 hr patch Place 1 patch on the skin daily. Remove old patch before applying new one. 28 patch 2    nicotine polacrilex (NICORETTE) 4 MG gum Chew 1 piece (4 mg total) and park in cheek every hour as needed for smoking cessation. Max is up to 24 pieces/day. 110 each 2    pen needle, diabetic (PEN NEEDLE) 31 gauge x 5/16 (8 mm) Ndle Injection Frequency is 1 time per day 100 each 11    pen needle, diabetic (TRUEPLUS PEN NEEDLE) 32 gauge x 5/32 (4 mm) Ndle Use with insulin glargine once daily 100 each 1    rifAMPin (RIFADIN) 300 MG capsule Take 1 capsule (300 mg total) by mouth two (2) times a day.  spironolactone (ALDACTONE) 25 MG tablet Take 1 tablet (25 mg total) by mouth daily.       No current facility-administered medications for this encounter.     Prior Functional Status:   Dysphagia History: Patient with complaint of difficulty swallowing primarily solid foods and pills. He is currnently under medical care for stage 4 metastatic thyroid cancer being treated with lenvatinib Patient has a history of uncontrolled diabetes mellitus.Referring oncologist, Marshell Garfinkel ANP.    Diet Prior to this Study: regular per report      Objective  Cognitive-Communication Status : normal per quick interaction   Oral Mechanism : grossly normal    Positioning : Upright in chair, MBSS Chair    Bolus Presentation : Cup Sip, Spoon      Oral Consistency 1  Stage of Consistency: Thin Liquids, Level 0  Oral Stage: Piecemeal deglutition, Mild oral residue, Reduced control with oral residue  Swallow Initiation : Swallow initiation after spillage over the epiglottis  Nasopharyngeal Reflux : None noted  Pharyngeal Stage : Posterior Leakage Present  Penetration Aspiration Score: 2 - Material enters the airway, remains above the vocal folds, and is ejected from the airway.  Aspiration Volume : None  Esophageal Phase Screening : UES Function WFL     Oral Consistency 2  Oral Consistency: Puree- Extremely Thick Level 4  Oral Stage : WFL  Swallow Initiation : Vallecular space swallow initiation  Nasopharyngeal Reflux: None noted  Pharyngeal Stage : WFL, Pharyngeal Constriction WFL, Laryngeal Closure WFL  Penetration Aspiration Score : 1 - Material does not enter airway  Aspiration Volume : None  Esophageal Phase Screening : UES Function WFL     Oral Consistency 3  Oral Consistency: Regular Solids  Oral Stage : WFL  Swallow Initiation : Vallecular space swallow initiation  Nasopharyngeal Reflux: None noted  Pharyngeal Stage : WFL  Penetration Aspiration Score: 1 - Material does not enter airway  Aspiration Volume : None  Esophageal Phase Screening : UES Function WFL     Duration of Visit: 30    I attest that I have reviewed the above information.  Signed: Edwin Cap, CCC-SLP  02/09/2023 4:58 PM

## 2023-02-14 ENCOUNTER — Inpatient Hospital Stay: Admit: 2023-02-14 | Discharge: 2023-02-14 | Payer: PRIVATE HEALTH INSURANCE

## 2023-02-14 ENCOUNTER — Other Ambulatory Visit: Admit: 2023-02-14 | Discharge: 2023-02-14 | Payer: PRIVATE HEALTH INSURANCE

## 2023-02-14 ENCOUNTER — Ambulatory Visit
Admit: 2023-02-14 | Discharge: 2023-02-14 | Payer: PRIVATE HEALTH INSURANCE | Attending: Student in an Organized Health Care Education/Training Program | Primary: Student in an Organized Health Care Education/Training Program

## 2023-02-14 DIAGNOSIS — C73 Malignant neoplasm of thyroid gland: Principal | ICD-10-CM

## 2023-02-14 DIAGNOSIS — L732 Hidradenitis suppurativa: Principal | ICD-10-CM

## 2023-02-14 LAB — CBC W/ AUTO DIFF
BASOPHILS ABSOLUTE COUNT: 0 10*9/L (ref 0.0–0.1)
BASOPHILS RELATIVE PERCENT: 0.5 %
EOSINOPHILS ABSOLUTE COUNT: 0.3 10*9/L (ref 0.0–0.5)
EOSINOPHILS RELATIVE PERCENT: 3.5 %
HEMATOCRIT: 39.7 % (ref 39.0–48.0)
HEMOGLOBIN: 12.9 g/dL (ref 12.9–16.5)
LYMPHOCYTES ABSOLUTE COUNT: 1.5 10*9/L (ref 1.1–3.6)
LYMPHOCYTES RELATIVE PERCENT: 21.3 %
MEAN CORPUSCULAR HEMOGLOBIN CONC: 32.5 g/dL (ref 32.0–36.0)
MEAN CORPUSCULAR HEMOGLOBIN: 23.3 pg — ABNORMAL LOW (ref 25.9–32.4)
MEAN CORPUSCULAR VOLUME: 71.8 fL — ABNORMAL LOW (ref 77.6–95.7)
MEAN PLATELET VOLUME: 9.2 fL (ref 6.8–10.7)
MONOCYTES ABSOLUTE COUNT: 0.5 10*9/L (ref 0.3–0.8)
MONOCYTES RELATIVE PERCENT: 6.7 %
NEUTROPHILS ABSOLUTE COUNT: 4.9 10*9/L (ref 1.8–7.8)
NEUTROPHILS RELATIVE PERCENT: 68 %
PLATELET COUNT: 302 10*9/L (ref 150–450)
RED BLOOD CELL COUNT: 5.53 10*12/L (ref 4.26–5.60)
RED CELL DISTRIBUTION WIDTH: 17.1 % — ABNORMAL HIGH (ref 12.2–15.2)
WBC ADJUSTED: 7.3 10*9/L (ref 3.6–11.2)

## 2023-02-14 LAB — SLIDE REVIEW

## 2023-02-14 LAB — COMPREHENSIVE METABOLIC PANEL
ALBUMIN: 3.8 g/dL (ref 3.4–5.0)
ALKALINE PHOSPHATASE: 118 U/L — ABNORMAL HIGH (ref 46–116)
ALT (SGPT): 23 U/L (ref 10–49)
ANION GAP: 13 mmol/L (ref 5–14)
AST (SGOT): 23 U/L (ref <=34)
BILIRUBIN TOTAL: 0.4 mg/dL (ref 0.3–1.2)
BLOOD UREA NITROGEN: 15 mg/dL (ref 9–23)
BUN / CREAT RATIO: 15
CALCIUM: 10.3 mg/dL (ref 8.7–10.4)
CHLORIDE: 96 mmol/L — ABNORMAL LOW (ref 98–107)
CO2: 28 mmol/L (ref 20.0–31.0)
CREATININE: 1.02 mg/dL (ref 0.73–1.18)
EGFR CKD-EPI (2021) MALE: 87 mL/min/{1.73_m2} (ref >=60)
GLUCOSE RANDOM: 173 mg/dL (ref 70–179)
POTASSIUM: 3.6 mmol/L (ref 3.4–4.8)
PROTEIN TOTAL: 8.5 g/dL — ABNORMAL HIGH (ref 5.7–8.2)
SODIUM: 137 mmol/L (ref 135–145)

## 2023-02-14 LAB — T4, FREE: FREE T4: 1.81 ng/dL — ABNORMAL HIGH (ref 0.89–1.76)

## 2023-02-14 LAB — TSH: THYROID STIMULATING HORMONE: 1.183 u[IU]/mL (ref 0.550–4.780)

## 2023-02-14 MED ORDER — HYDROXYZINE HCL 25 MG TABLET
ORAL_TABLET | Freq: Three times a day (TID) | ORAL | 0 refills | 10.00 days | Status: CP | PRN
Start: 2023-02-14 — End: ?
  Filled 2023-03-19: qty 30, 10d supply, fill #0

## 2023-02-14 MED ORDER — CLINDAMYCIN HCL 300 MG CAPSULE
ORAL_CAPSULE | Freq: Two times a day (BID) | ORAL | 0 refills | 30.00 days | Status: CP
Start: 2023-02-14 — End: ?
  Filled 2023-03-21: qty 68, 34d supply, fill #0

## 2023-02-14 MED ORDER — CLINDAMYCIN 1 % LOTION
Freq: Two times a day (BID) | TOPICAL | 11 refills | 0.00 days | Status: CP
Start: 2023-02-14 — End: ?

## 2023-02-14 MED ORDER — RIFAMPIN 300 MG CAPSULE
ORAL_CAPSULE | Freq: Two times a day (BID) | ORAL | 2 refills | 30 days | Status: CP
Start: 2023-02-14 — End: 2023-05-15

## 2023-02-14 MED ADMIN — iohexol (OMNIPAQUE) 350 mg iodine/mL solution 75 mL: 75 mL | INTRAVENOUS | @ 18:00:00 | Stop: 2023-02-14

## 2023-02-14 NOTE — Unmapped (Signed)
[facility]  PCP: Vernelle Emerald, DO    Consulting providers:  Otolaryngology: Dr. 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 man with history of poorly controlled T2DM, hypertension, ischemic cardiac disease s/p catheterization in 2021 and metastatic thyroid cancer, s/p resection with Dr. Azucena Fallen on 04/12/20 and neck dissection on 07/28/20. Noted to have metastatic disease with pulmonary involvement on PET/CT (01/14/2022). Treating with 1L Lenvima 18 mg daily (02/21/21-current). Course has been complicated by poor compliance and HS flares. He presents today in follow up on treatment to review scans.    # Metastatic Papillary Thyroid Cancer, BRAF WT  - Treatment goals are palliative.   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 Janyth Contes, on 02/20/23    # Hypertension, better control  - Continue current amlodipine 10 mg daily and Lorsartan 100 mg  - Encouraged medical compliance and frequent home checks  - Managed by his PCP    # Diabetes, poorly controlled  - A1c 14.6% (11/17/21) --> 9.8% (02/01/22) --> 11.4 (06/20/22) --> 13.6 (12/21/22)  - Continue metformin XR 500 mg daily given adherence challenges with IR BID dosing in past  - Continue Lantus 18 units daily  - Discussed importance of compliance     # Hypothyroidism,  - Poor control due to lack of medication compliance  - Goal TSH <0.1  - TSH 1.183, FT4 1.81 today (02/14/2023)  - Continue synthroid 300 mcg once daily.  Refilled today    # Supportive care  - Cancer-related pain: Continue on Gabapentin 300mg  BID, ibuprofen PRN for wound site pain  - Psychosocial: denies any need for CCSP at this time.   - Constipation: Recommended Miralax     Follow up: 3 months    Carle Fenech   Head and Neck Medical Oncology  [facility]    --------------------------  Interval History  - At his last visit in November, I recommended urgent treatment of his HS, which was his primary concern  - He was admitted to [facility] later that day with concern for necrotizing fasciitis s/p debridement consistent with HS. Started on clindamycin + rifampin for streptococcus wound infection and HS. Now established with [facility] and plans to see Dr. Janyth Contes next week for unroofing procedure consultation   - Paul Hines has been on hold since his admission on 12/20/22, to allow his wounds to heal  - In the interim, [name] ordered [facility] genetic testing to identify potential targetable mutation given challenges with VEGF  and HS   - He returns today for a discussion of interval genetic testing and today's scans   - Patient reports he has been feeling okay, with slowly improving discomfort d/t HS  - Still having wounds packed  - Denies fevers, chills, CP, SOB, cough, NVD, HFS  - Presents alone    History of Present Illness:  Paul Hines is a 53 y.o. male with history of T2DM, hypertension, ischemic heart disease s/p cardiac catheterization (07/2019) who presents for evaluation of head and neck cancer. I have reviewed his records including history, imaging, pathology reports, and, when applicable, operative notes and summarized his oncologic history below:     Patient was incidentally diagnosed on workup for chest pain in 2021. CTA cardiac (07/08/2019) showed bilateral pulmonary nodules and repeat imaging from 10/2019 and 12/2019 redemonstrated nodules. PET/CT obtained (10/2019) and was notable for hypermetabolic right hilar and mediastinal lymph nodes. FNA performed on 01/09/2020 to dominant right upper pulmonary nodule and confirmed papillary thyroid carcinoma. Further biopsy of lymph nodes were negative for malignancy.      He was evaluated by Dr. Azucena Fallen in February 2022. At that time he reports intermittent dysphagia, but was not significantly bothersome and did not limit his diet. He otherwise denied palpable neck mass, voice changes, otalgia or weight loss. Pre-treatment Tg was 75 in January 2022. Patient underwent total thyroidectomy on 04/12/2020 with bilateral neck dissection on 07/28/2020. Final pathology consistent with PTC, lymph node involvement (14/14) with positive ENE. Received adjuvant RAI (200 mCi I-131) on 05/20/2020.      PET/CT (01/14/2021) concerning for numerous pulmonary nodules that had increased in size when compared to prior imaging. Otherwise, no evidence of distant disease. Tumor marker was also noted to be elevated at 212 (01/04/2021).      02/01/2021: Present unaccompanied today, feeling well until recent illness as described above. He does report residual numbness all along the underside of his face. Previously smoked 2 packs of cigarettes per day for 14 years. Currently smoking ~3 cigarettes daily. He is not currently employed, but would like to work if possible. He was recently been released from prison and is currently staying with [facility].     Past Medical History:   Diagnosis Date    Abnormal findings on dx imaging of heart and cor circ Acne vulgaris     Allergic contact dermatitis due to food in contact with skin     Atherosclerosis     Athscl heart disease of native coronary artery w/o ang pctrs     Chronic ischemic heart disease     Cutaneous abscess     Diabetic polyneuropathy (CMS-HCC)     Disorder of skin and subcutaneous tissue     Essential hypertension     H/O medication noncompliance     Hernia, inguinal, unilateral     Hypertrophic cardiomyopathy (CMS-HCC)     Malignant neoplasm of thyroid gland (CMS-HCC)     Presbyopia     Pseudofolliculitis barbae     Shoulder pain, bilateral     Tinea unguium     Type 2 diabetes mellitus (CMS-HCC)     Xerosis cutis        Past Surgical History:   Procedure Laterality Date    PR BRNCHSC EBUS GUIDED SAMPL 1/2 NODE STATION/STRUX N/A 01/09/2020    Procedure: BRONCH, RIGID OR FLEXIBLE, INC FLUORO GUIDANCE, WHEN PERFORMED; WITH EBUS GUIDED TRANSTRACHEAL AND/OR TRANSBRONCHIAL SAMPLING, ONE OR TWO MEDIASTINAL AND/OR HILAR LYMPH NODE STATIONS OR STRUCTURES;  Surgeon: Jerelyn Charles, MD;  Location: MAIN OR [facility];  Service: Pulmonary    PR BRNSCHSC TNDSC EBUS DX/TX INTERVENTION PERPH LES Right 01/09/2020    Procedure: BRONCH, RIGID OR FLEXIBLE, INCLUDING FLUORO GUIDANCE, WHEN PERFORMED; WITH TRANSENDOSCOPIC EBUS DURING BRONCHOSCOPIC DIAGNOSTIC OR THERAPEUTIC INTERVENTION(S) FOR PERIPHERAL LESION(S);  Surgeon: Jerelyn Charles, MD;  Location: MAIN OR [place];  Service: Pulmonary    PR BRONCHOSCOPY,COMPUTER ASSIST/IMAGE-GUIDED NAVIGATION Right 01/09/2020    Procedure: ROBOT ION BRONCHOSCOPY,RIGID OR FLEXIBLE,INCLUDE FLUORO WHEN PERFORMED; W/COMPUTER-ASSIST,IMAGE-GUIDED NAVIGATION;  Surgeon: Jerelyn Charles, MD;  Location: MAIN OR [place];  Service: Pulmonary    PR BRONCHOSCOPY,DIAGNOSTIC W LAVAGE Right 01/09/2020    Procedure: BRONCHOSCOPY, RIGID OR FLEXIBLE, INCLUDE FLUOROSCOPIC GUIDANCE WHEN PERFORMED; W/BRONCHIAL ALVEOLAR LAVAGE;  Surgeon: Jerelyn Charles, MD;  Location: MAIN OR [place];  Service: Pulmonary    PR BRONCHOSCOPY,TRANSBRON ASPIR BX Right 01/09/2020    Procedure: BRONCHOSCOPY, RIGID/FLEX, INCL FLUORO; W/TRANSBRONCH NDL ASPIRAT BX, TRACHEA, MAIN STEM &/OR LOBAR BRONCHUS;  Surgeon: Jerelyn Charles, MD;  Location: MAIN OR [name];  Service: Pulmonary    PR BRONCHOSCOPY,TRANSBRONCH BIOPSY Right 01/09/2020    Procedure: BRONCHOSCOPY, RIGID/FLEXIBLE, INCLUDE FLUORO GUIDANCE WHEN PERFORMED; W/TRANSBRONCHIAL LUNG BX, SINGLE LOBE;  Surgeon: Jerelyn Charles, MD;  Location: MAIN OR [facility];  Service: Pulmonary    PR CATH PLACE/CORON ANGIO, IMG SUPER/INTERP,W LEFT HEART VENTRICULOGRAPHY N/A 08/26/2019    Procedure: Left Heart Catheterization;  Surgeon: Marlaine Hind, MD;  Location[facility] CATH;  Service: Cardiology    PR LIGATN THOR DUCT,CERV APPROACH Bilateral 07/28/2020    Procedure: SUTURE &/OR LIG THORACIC DUCT; CERV APPROACH;  Surgeon: Lauralee Evener, MD;  Location: MAIN OR [facility];  Service: ENT    PR REMOVAL NODES, NECK,CERV MOD RAD Bilateral 07/28/2020    Procedure: CERVICAL LYMPHADENECTOMY (MODIFIED RADICAL NECK DISSECTION);  Surgeon: Lauralee Evener, MD;  Location: MAIN OR [facility];  Service: ENT    PR THYROIDECTOMY,MALIG,LTD NECK SURG Bilateral 04/12/2020    Procedure: THYROIDECTOMY, TOTAL OR SUBTOTAL FOR MALIGNANCY; WITH LIMITED NECK DISSECTION;  Surgeon: Lauralee Evener, MD;  Location: MAIN OR [facility];  Service: ENT       Current Outpatient Medications   Medication Sig Dispense Refill    alcohol swabs (ALCOHOL PREP PADS) PadM Use three times a day as needed. 100 each 0    amlodipine (NORVASC) 10 MG tablet Take 1 tablet (10 mg total) by mouth daily. 90 tablet 3    atorvastatin (LIPITOR) 80 MG tablet Take 1 tablet (80 mg total) by mouth nightly. 90 tablet 2    BD LUER-LOK SYRINGE 3 mL 23 gauge x 1 1/2 Syrg       bimekizumab-bkzx 160 mg/mL AtIn Inject the contents of 2 pens (320mg ) every 2 weeks for the first 16 weeks followed by every 4 weeks. 4 mL 4    bimekizumab-bkzx 160 mg/mL AtIn Inject the contents of 2 pens (320mg ) subcutaneous every 4 weeks 2 mL 11    blood sugar diagnostic (GLUCOSE BLOOD) Strp Disp test strips preferred by insurance plan. Testing qday, Dx: E11.9 (Type 2 DM- controlled) 50 strip 11    blood-glucose meter kit Disp. blood glucose meter kit preferred by patient's insurance. Check blood sugars as directed by provider. Dx: Diabetes, E11.9 1 each 11    carvedilol (COREG) 6.25 MG tablet Take 1 tablet (6.25 mg total) by mouth two (2) times a day.      cetirizine (ZYRTEC) 10 MG tablet Take 1 tablet (10 mg total) by mouth daily. 30 tablet  2    DEXCOM G7 RECEIVER Misc Use as directed. 1 each 0    DEXCOM G7 SENSOR Devi Apply Dexcom G7 sensor every 10 days. 9 each 3    empty container Misc Use as directed to dispose of Humira pens. 1 each 2    gabapentin (NEURONTIN) 300 MG capsule Take 1 capsule (300 mg total) by mouth two (2) times a day. 180 capsule 2    ibuprofen (MOTRIN) 600 MG tablet Take 1 tablet (600 mg total) by mouth two (2) times a day. 60 tablet 1    insulin glargine (BASAGLAR, LANTUS) 100 unit/mL (3 mL) injection pen Inject 0.3 mL (30 Units total) under the skin nightly. 15 mL 2    insulin lispro (HUMALOG) 100 unit/mL injection Inject 0-0.12 mL (0-12 Units total) under the skin.      insulin syringe-needle U-100 0.3 mL 31 gauge x 5/16 (8 mm) Syrg Use as directed 90 each 12    lancets Misc Disp. lancets #100 or amount allowed, Testing Qday. Dx: E11.9 (Diabetes- controlled) 100 each 11    levothyroxine (SYNTHROID) 300 MCG tablet Take 1 tablet (300 mcg total) by mouth daily. 90 tablet 3    losartan-hydroCHLOROthiazide (HYZAAR) 100-25 mg per tablet Take 1 tablet by mouth daily.      metFORMIN (GLUCOPHAGE-XR) 500 MG 24 hr tablet Take 3 tablets (1,500 mg total) by mouth daily with evening meal. 270 tablet 2    naloxone (NARCAN) 0.4 mg/mL injection Infuse 1 mL (0.4 mg total) into a venous catheter.      nicotine (NICODERM CQ) 21 mg/24 hr patch Place 1 patch on the skin daily. Remove old patch before applying new one. 28 patch 2    nicotine polacrilex (NICORETTE) 4 MG gum Chew 1 piece (4 mg total) and park in cheek every hour as needed for smoking cessation. Max is up to 24 pieces/day. 110 each 2    ondansetron (ZOFRAN-ODT) 4 MG disintegrating tablet       pen needle, diabetic (PEN NEEDLE) 31 gauge x 5/16 (8 mm) Ndle Injection Frequency is 1 time per day 100 each 11    pen needle, diabetic (TRUEPLUS PEN NEEDLE) 32 gauge x 5/32 (4 mm) Ndle Use with insulin glargine once daily 100 each 1    spironolactone (ALDACTONE) 25 MG tablet Take 1 tablet (25 mg total) by mouth daily.      clindamycin (CLEOCIN T) 1 % lotion Apply topically two (2) times a day. 60 mL 11    clindamycin (CLEOCIN) 300 MG capsule Take 1 capsule (300 mg total) by mouth two (2) times a day. 60 capsule 0    hydrOXYzine (ATARAX) 25 MG tablet Take 1 tablet (25 mg total) by mouth every eight (8) hours as needed for itching. 30 tablet 0    rifAMPin (RIFADIN) 300 MG capsule Take 1 capsule (300 mg total) by mouth two (2) times a day. 60 capsule 2     No current facility-administered medications for this visit.       Allergies   Allergen Reactions    Penicillin Anaphylaxis    Fish Derived     Lisinopril     Peanut Butter Flavor      Per allergy testing    Penicillins Other (See Comments)     Per patient, when he took at age 27 his legs and arms shook. Not sure if it was seizure or not.     Shellfish Containing Products     Soy  Social History     Tobacco Use    Smoking status: Every Day     Types: Cigarettes     Passive exposure: Current    Smokeless tobacco: Never   Vaping Use    Vaping status: Never Used   Substance Use Topics    Alcohol use: Yes    Drug use: Not Currently     Types: Marijuana     Comment: quit in 1992       Social History     Social History Narrative    Not on file       Family History   Problem Relation Age of Onset    Melanoma Neg Hx     Basal cell carcinoma Neg Hx     Squamous cell carcinoma Neg Hx        Review of Systems: A 12-system review of systems was obtained including: Constitutional, Eyes, ENT, Cardiovascular, Respiratory, GI, GU, Musculoskeletal, Skin, Neurological, Psychiatric, Endocrine, Heme/Lymphatic, and Allergic/Immunologic systems. It is negative or non-contributory to the patient???s management except for as stated in patient's HPI    ECOG PS: 1    Physical Examination:  Vital Signs: BP 140/81  - Pulse 93  - Temp (!) 3.1 ??C (37.6 ??F) (Temporal)  - Resp 16  - Ht 170.2 cm (5' 7)  - Wt 78.5 kg (173 lb)  - SpO2 99%  - BMI 27.10 kg/m??   CONSTITUTIONAL: Pleasant man, mild discomfort due to HS  Oral Cavity: MMM, no oral lesions or mucositis  Lymphatics: No lymphadenopathy   CV: RRR; no lower extremity edema  RESP: normal work of breathing  GI: Soft, non-tender, non-distended  SKIN: No skin rashes; groin not examined however tender per patient  NEURO: no focal deficits appreciated,  PSYCH: Normal mood and appropriate affect    LABS  Lab Results   Component Value Date    WBC 7.3 02/14/2023    HGB 12.9 02/14/2023    HCT 39.7 02/14/2023    PLT 302 02/14/2023       Lab Results   Component Value Date    NA 137 02/14/2023    K 3.6 02/14/2023    CL 96 (L) 02/14/2023    CO2 28.0 02/14/2023    BUN 15 02/14/2023    CREATININE 1.02 02/14/2023    GLU 173 02/14/2023    CALCIUM 10.3 02/14/2023    MG 1.6 07/31/2020    PHOS 3.0 07/31/2020       Lab Results   Component Value Date    BILITOT 0.4 02/14/2023    PROT 8.5 (H) 02/14/2023    ALBUMIN 3.8 02/14/2023    ALT 23 02/14/2023    AST 23 02/14/2023    ALKPHOS 118 (H) 02/14/2023       No results found for: PT, INR, APTT      IMAGING  CT Neck (02/14/2023)  No definitive evidence of recurrent disease in the neck. Previously described hypodensities in the lower neck between the esophagus and trachea are less conspicuous. Recommend attention on follow-up examination.     CT Chest (02/14/2023)  Stable miliary pulmonary metastasis.     PATHOLOGY  THYROID GLAND  8th Edition - Protocol posted: 03/27/2018   THYROID GLAND: RESECTION - All Specimens  Clinical History  No known radiation exposure    SPECIMEN   Procedure  Total thyroidectomy    TUMOR   Tumor Focality  Multifocal    Tumor Characteristics     Tumor Site  Right  lobe      Left lobe      Isthmus    Histologic Type  Papillary carcinoma, classic (usual, conventional)    Tumor Size  Greatest Dimension (Centimeters): 3.1 cm   Extrathyroidal Extension  Not identified    Angioinvasion (vascular invasion)  Not identified    Lymphatic Invasion  Not identified    Perineural Invasion  Not identified    Margins  Uninvolved by carcinoma    Distance of Invasive Carcinoma from Closest Margin (Millimeters)  1 mm   Mitotic Rate  4 Mitoses per 2 mm^2   LYMPH NODES   Number of Lymph Nodes Involved  14    Nodal Levels Involved  Level VI    Size of Largest Metastatic Deposit (Centimeters)  5 cm   Extranodal Extension (ENE)  Present    Number of Lymph Nodes Examined  14    Nodal Levels Examined  Level VI    PATHOLOGIC STAGE CLASSIFICATION (pTNM, AJCC 8th Edition)   TNM Descriptors  m (multiple primary tumors)         Primary Tumor (pT)  pT2    Regional Lymph Nodes (pN)  pN1a    Distant Metastasis (pM)  pM1    Site(s)  lung see [id number]      [facility] xT       I have personally reviewed relevant imaging, laboratory values, existing medical records, and pathology. I have summarized these findings in the oncology history above.     Scribe's Attestation: Ginette Otto, DO obtained and performed the history, physical exam and medical decision making elements that were entered into the chart. Signed by Welton Flakes, Scribe, on February 14, 2023 at 4:13 PM     ----------------------------------------------------------------------------------------------------------------------  Documentation assistance provided by the Scribe. I was present during the time the encounter was recorded. The information recorded by the Scribe was done at my direction and has been reviewed and validated by me.  ----------------------------------------------------------------------------------------------------------------------

## 2023-02-15 NOTE — Unmapped (Signed)
Paul Hines has been discontinued by provider and patient will transition to Vitrakvi. Will forward information to pharmacist.

## 2023-02-16 DIAGNOSIS — C73 Malignant neoplasm of thyroid gland: Principal | ICD-10-CM

## 2023-02-16 MED ORDER — LAROTRECTINIB 100 MG CAPSULE
ORAL_CAPSULE | Freq: Two times a day (BID) | ORAL | 5 refills | 30.00 days | Status: CP
Start: 2023-02-16 — End: ?
  Filled 2023-03-19: qty 60, 30d supply, fill #0

## 2023-02-20 ENCOUNTER — Ambulatory Visit: Admit: 2023-02-20 | Discharge: 2023-02-21 | Payer: PRIVATE HEALTH INSURANCE

## 2023-02-20 MED ORDER — TRIAMCINOLONE ACETONIDE 0.1 % TOPICAL OINTMENT
Freq: Two times a day (BID) | TOPICAL | 0 refills | 0.00 days | Status: CP
Start: 2023-02-20 — End: 2024-02-20
  Filled 2023-03-19: qty 80, 30d supply, fill #0

## 2023-02-20 MED ORDER — DIAZEPAM 5 MG TABLET
ORAL_TABLET | Freq: Once | ORAL | 0 refills | 1.00 days | Status: CP | PRN
Start: 2023-02-20 — End: 2023-02-20

## 2023-02-20 NOTE — Unmapped (Deleted)
ASSESSMENT/PLAN:  Hidradenitis Suppurativa   1. We discussed the typical natural history, pathogenesis, treatment options, and expected course as well as the relapsing and sometimes recalcitrant nature of the disease.    2. ***    RTC: ***     SUBJECTIVE:    CC: Hidradenitis Suppurativa    Paul Hines is a 53 y.o. male  who is seen for consultation today at the request of *** for evaluation of hidradenitis suppurativa.            ROS: the balance of 10 systems is negative unless otherwise documented      OBJECTIVE:   Gen: Well-appearing patient, appropriate, interactive, in no acute distress  Skin: Examination of the scalp, face, neck, chest, back, abdomen, bilateral upper and lower extremities, hands, palms, soles, nails, buttocks, and external genitalia performed today and pertinent for:     location Abscess Inflamed nodule Non-inflamed nodule Draining sinus Non-draining Sinus [place] BSA at site Color change  Inflamed induration Open skin surface  Tunnels   R axilla              L axilla              R inframammary              L inframammary              Intermammary              Pubic              R inguinal              R thigh              L inguinal              L thigh              Scrotum/Vulva              Perianal              R buttock              L buttock              Other (list)                            *0=none, 1=mild, 2=moderate, 3=severe    AN count (total sum of abscess and inflammatory nodule): ***  Pilonidal sinus (Y/N, or previously treated)? {pilonidal presence:55619}  Approximate BSA involved by inflammatory lesions: ***  Intertriginous comedones: {Desc; few - many:55616}  Diffuse comedones (trunk, face, etc): {Desc; few - many:55616}  Acne scars: {Desc; few - MWNU:27253}  Cribriform scarring: {YES/NO:21013}  Intertrigionus epidermal inclusion cysts: {Desc; few - many:55618}  Diffuse (trunk, feace, extremities) epidermal inclusion cysts: {Desc; few - GUYQ:03474}  Phenotype: {hsphenotype:64293}  -sites not commented on demonstrate normal findings.

## 2023-02-20 NOTE — Unmapped (Signed)
 I saw and evaluated the patient, participating in the key portions of the service.  I reviewed the resident???s note.  I agree with the resident???s findings and plan. Leonette Nutting, MD

## 2023-02-20 NOTE — Unmapped (Signed)
If having trouble with your bimekizumab [facility])    [facility] [phone])

## 2023-02-20 NOTE — Unmapped (Addendum)
Dermatology Note     Assessment and Plan:      Hidradenitis suppurativa, severe, Hurley III, Chronic: flared or not at treatment goal  Current stage 4 metastatic thyroid cancer being treated with lenvatinib   - Discussed that combination of medical and surgical therapy is often required for treatment.   - Discussed at length role of surgery and unroofing procedures  - Discussed that unroofing procedures under [place] may not be ideal with comorbid tobacco use, uncontrolled T2DM, HTN, CAD  - Made joint decision for  unroofing of infra-abdominal (size: 6cm x 4cm) or left inguinal crease (4cm x 6cm), message sent to schedulers. In the future, would want the buttocks unroofed, but this is probably a little more complex and I'd like to see how he does with Bimzelx first.  Kennon Portela a few areas that are about 3-4cm and deeper on buttocks and perianal area.  - Discussed that the medication he is on for his metastatic thyroid cancer (lenvatinib) can sometimes cause lesions to flare  - Patient has a history of uncontrolled diabetes mellitus (most recent A1C 13.6 on 11/24), encouraged glycemic control as this can also help with disease activity  - Referring oncologist, Dr. Ginette Otto Hemant  - Continue bimekizumab (Bimzelx) 160 mg/mL AtIn; Inject 320mg  every 2 weeks for the first 16 weeks followed by every 4 weeks.  Dispense: 4 mL; Refill: 4  - Continue clindamycin 300mg  BID  - Continue rifampin 300mg  BID  - continue clindamycin (CLEOCIN T) 1 % lotion; Apply topically two (2) times a day.  - Continue Hibiclens wash qday in shower  - continue hydrOXYzine (ATARAX) 25 MG tablet; Take 1 tablet (25 mg total) by mouth every eight (8) hours as needed for itching.  Dispense: 30 tablet; Refill: 0  - continue cetirizine (ZYRTEC) 10 MG tablet; Take 1 tablet (10 mg total) by mouth daily.  Dispense: 30 tablet; Refill: 2  - Start triamcinolone (KENALOG) 0.1 % ointment; Apply topically two (2) times a day. Apply twice a day to affected areas of the skin until the skin feels smooth, then stop.  Dispense: 80 g; Refill: 0     High risk medication use, bimekizumab (Bimzelx)   - Quantiferon TB negative March 2024  - hepatitis panel within normal limits 2024    Anxiety due to invasive procedure  - diazePAM (VALIUM) 5 MG tablet; Take 2 tablets (10 mg total) by mouth 1 (one) time if needed for anxiety for up to 1 dose. Take 30 minutes before procedure.  Dispense: 2 tablet; Refill: 0      The patient was advised to call for an appointment should any new, changing, or symptomatic lesions develop.     RTC: Return in about 3 months (around 05/21/2023) for HS unroofing. or sooner as needed   _________________________________________________________________      Chief Complaint     Chief Complaint   Patient presents with    HS     On groin for 11/2 years. Tx:       HPI     Paul Hines is a 53 y.o. male who presents as a patient who is seen in consultation by Leonette Nutting, MD at the request of Gabriel Carina, MD to Dermatology for  evaluation for hidradenitis suppurativa unroofing.     Per chart review, started bimekizumab (Bimzelx) on 01/03/23 (likely received and started after, has had first 2 loading doses). Has not noticed much of a difference so far.  Self-reported severity (0-5): 5  VAS pain today: 5  VAS average pain for the last month: 8  Requiring pain medication? -  If so, what type/frequency? Ibuprofen  How often in pain?  continuously  Level of odor (0-5): 4  Level of itching (0-5): 5  Dressing changes needed for drainage:Several times a day  How much drainage: a lot of drainage  Flare in the last month (Y/N)? -  How long ago was the last flare? more than one year ago  Developing new lesions? more than one year ago  Number of inflammatory lesions montly: not answered  DLQI: 17  HiSQOL: -  Current treatment: see above     How helpful is the current treatment in managing the following aspects of your disease?  Not at all helpful Somewhat helpful Very helpful   Pain      Decreasing length of flares      Decreasing new lesions      Drainage      Decreasing frequency of flares      Decreasing severity of flares      Odor        Disease course:  Year when symptoms first noticed: not answered  Year of diagnosis: not answered  Who diagnosed you? Not answered  Location of first symptoms: groin  Typical involved areas include: groin, buttocks  Typical number of inflammatory lesions each month at baseline (from first visit): not answered  Disease triggers: sweat     FH:      Patient Mother Father Son Daughter Brother Sister Maternal Grandmother Maternal Grandfather Paternal Grandmother Paternal Grandfather Maternal Aunt Maternal Uncle Paternal Aunt Paternal Uncle Other:   Derm Hidradenitis Suppurativa   x                                   Pilonidal sinus                                     Acne                                      Dissecting cellulitis(Scalp)                                     Eczema                                     Allergies                    Rheum Joint pains                                      SAPHO                                     Pyoderma gangrenosum  Back pain                                    Auto-immune disease                                     Asthma                    Endo Polycystic ovarian syndrome                                     Thyroid disease                    Type 1 diabetes                    Type 2 diabetes                    Vitamin D deficiency                   Psych Anxiety                                     Depression                                     Dementia                                     Suicidal thoughts*                                   Cardio Hypertension                                     High cholesterol                                     Heart attack                                    Stroke                                     Hem-onc Cancer  ______________                                    Anemia                    GI IBD (UC/Crohn's)  ID HIV                     Syphilis                     Other                         Social History:  Current or former smoker? current  Amount smoking: half-1 ppd  How many years: 35 years  ED visits in the last 5 years? Not answered  Difficulty affording medications? Not answered  Marital Status: not answered  Living with some one? Not answered    Never vaped   No Cannabis use      Prior treatments:  Topical: clindamycin  Systemic: many antibiotics with >3 months course, Humira, Cosentyx  Past surgical procedures: -  Past laser procedures: none  Past Medical History, Family History, Social History, Medication List, Allergies, and Problem List were reviewed in the rooming section of Epic.     ROS: Other than symptoms mentioned in the HPI, no fevers, chills, or other skin complaints    Physical Examination     GENERAL: Well-appearing male in no acute distress, resting comfortably.  NEURO: Alert and oriented, answers questions appropriately  SKIN (Focal Skin Exam): Per patient request, examination of groin, buttocks was performed    location Abscess Inflamed nodule Non-inflamed nodule Draining sinus Non-draining Sinus Hurley % scar   R axilla          L axilla          R inframammary          L inframammary          Intermammary          Pubic          R inguinal  2  3      R thigh  1  4      L inguinal          L thigh          Scrotum/Vulva          Perianal  1  2      R buttock 1 2  2       L buttock 2 2  1 2      Other (list)  Infra-abdominal    1 2                 AN count (total sum of abscess and inflammatory nodule): 11  Pilonidal sinus? N  Facial acne scars present  Cribriform scar present  Syndromic type  Many intertriginous comedones  >10 intertriginous cysts    All areas not commented on are within normal limits or unremarkable  - male chaperone present      (Approved Template 10/13/2019)

## 2023-02-21 DIAGNOSIS — C73 Malignant neoplasm of thyroid gland: Principal | ICD-10-CM

## 2023-02-28 NOTE — Unmapped (Signed)
I called and spoke with the patient and informed him to stop taking the medication Rifampin 300 mg capsules, patient stated he had not picked up the medication from the pharmacy and he wrote down the medication name to confirm and stated he understood.    Darely Becknell,cma

## 2023-02-28 NOTE — Unmapped (Signed)
----- Message from Tammi Sou, MD sent at 02/27/2023  7:46 PM EST -----  Regarding: RE: rifampin  Sorry - totally slipped my mind to confirm it with him.     RN team - are you able to reach out and let him know we'd like him to stop his rifampin so he can start the new treatment for his thyroid cancer. He can continue his other treatments for now.  Thanks -CS  ----- Message -----  From: Rivka Barbara, CPP  Sent: 02/27/2023   9:03 AM EST  To: Elsie Stain, MD; Gabriel Carina, MD; #  Subject: RE: rifampin                                     Hi, can you all confirm that you stopped the rifampin? The note from 1/21 states to continue it, and its still on his med list. We will need it off before we can start his cancer therapy.     Thanks,  [name]  ----- Message -----  From: Gabriel Carina, MD  Sent: 02/17/2023   3:46 PM EST  To: Elsie Stain, MD; #  Subject: RE: rifampin                                     Hi All,     Definitely fine to stop the rifampin- his cancer treatment takes priority as we have other options. He was recently switched to bimzelx which should work better so hopefully he is doing better. If not when he sees [place] next week we can add an alternative.        Fleet Contras  ----- Message -----  From: Elsie Stain, MD  Sent: 02/16/2023   3:48 PM EST  To: Gabriel Carina, MD; [facility], DO; #  Subject: RE: rifampin                                     Hi - I haven't met him yet so I'm including Dr. Francia Greaves who has managed him so far. I think the cancer treatment will take priority over the rifampin and an alternative can be pursued for hidradenitis management, but would want Dr. Francia Greaves to weigh in as well.  [place] -CS  ----- Message -----  From: Rivka Barbara, CPP  Sent: 02/16/2023   3:33 PM EST  To: Elsie Stain, MD; #  Subject: rifampin                                         Hi Dr Janyth Contes,    I'm the CPP working with Dr Rosana Hoes to care for Mr. Membreno. We are planning to start a new treatment for his thyroid cancer (larotrectinib) which unfortunately has a major drug interaction with rifampin, reducing the efficacy of the cancer treatment. All alternative treatments also have a major DDI with rifampin.    Is it possible to take him off rifampin? You are seeing him next Tuesday and we will plan to start the treatment in the next week or so, and can wait to see what you think after that appointment.  Thanks,  [name]

## 2023-03-01 NOTE — Unmapped (Signed)
[facility] Specialty Medication Onboarding    Specialty Medication: VITRAKVI 100 mg capsule (larotrectinib)  Prior Authorization: Approved   Financial Assistance: No - copay  <$25  Final Copay/Day Supply: $4.00 / 30 days    Insurance Restrictions: None     Notes to Pharmacist: none  Credit Card on File: no  Start Date on Rx:  02/16/23    The triage team has completed the benefits investigation and has determined that the patient is able to fill this medication at [facility]. Please contact the patient to complete the onboarding or follow up with the prescribing physician as needed.

## 2023-03-06 NOTE — Unmapped (Signed)
[facility]    Patient Onboarding/Medication Counseling    Paul Hines is a 53 y.o. male with Vitrakvi who I am counseling today on initiation of therapy.  I am speaking to the patient.    Was a Nurse, learning disability used for this call? No    Verified patient's date of birth / HIPAA.    Specialty medication(s) to be sent: Hematology/Oncology: Vitrakvi and Inflammatory Disorders: Bimzelx      Non-specialty medications/supplies to be sent: sharps container, test strips, lancets,amlodipine,atorvastatin,cetirizine, dexcom 7 receiver and sensor, gabapentin,hydroxyzine,ibuprofen,lantus,levothyroxine,metformin, triamcinolone, pen needles      Medications not needed at this time: n/a         Vitrakvi (larotrectinib)    Medication & Administration     Dosage: take 1 capsules twice daily    Administration:  Discussed that capsules should be swallowed whole. Do not open, break or chew the capsule. May be administered with or without food      Adherence/Missed dose instructions:  Take missed dose as soon as you remember, if it is less than 6 hours until the next dose, skip the missed dose and go back to the normal time.  If you vomiting after taking a dose do not take another dose until next scheduled time  Record missed doses so our team is aware      Goals of Therapy     Prevent disease progression    Side Effects & Monitoring Parameters   Common side effects  Nausea/vomiting  Diarrhea/constipation  Fatigue  Abdominal pain  Cough  Stuffy nose  Muscle/joint pain  Muscle weakness  Lack of appetite  Weight gain        The following side effects should be reported to the provider  Heartbeat that doesn't feel normal (heart feels like it's racing, skipping a beat or fluttering)  Signs of a liver problem (dark urine, yellowing of skin and/or eyes, fatigue, lack of appetite, nausea, abdominal pain, light colored stools, vomiting)  Signs of kidney problems (unable to pass urine, blood in urine, change in amount of urine passed, change in urine color, or weight gain)  Signs of allergic reaction (rash, hives, shortness of breath)  Signs of neurotoxicity (delirium, tremors, dizziness, trouble speaking, change in balance, trouble with memory, feeling confused)  Signs of high blood pressure (severe headache, dizziness, passing out change in eyesight)  A burning , numbness or tingling feeling that is not normal  Swelling of arms or legs  Shortness of breath  Severe loss of strength and energy  Signs of infection (fever, chills, sore throat)    Monitoring parameters  Access neurotrophic receptor tyrosine kinase (NTRK) gene fusion status in tumor specimen (prior to treatment initiation)  Monitor liver function tests (ALT/AST every 2 weeks during the 1st month of treatment , then monthly thereafter and as clinically indicated)  Pregnancy tests in females of reproductive potential prior to treatment  Signs/Symptoms of neurotoxicity  Monitor adherence      Warnings & Precautions     Bone marrow suppression (anemia, neutropenia)  GI toxicity   Hepatotoxicity   Neurotoxicity  Some dosage forms may contain propylene glycol which in large amounts are potentially toxic and have been associated with hyperosmolality, lactic acidosis, seizures and respiratory depression.  Exposure of an unborn child to this medication could cause birth defects so you should not become pregnant or father a child while on this medication and for at least 1 week after treatment completion  It is not known  if present in breast milk therefore breastfeeding is not recommended during therapy and for 1 week after final dose.      Drug/Food Interactions     Medication list reviewed in Epic. The patient was instructed to inform the care team before taking any new medications or supplements.  Patient instructed to stop taking Rifampin . Avoid live vaccines.  Avoid grapefruit and grapefruit juice    Storage, Handling Precautions, & Disposal     The liquid formulation should be stored in the refrigerator (do not freeze) and throw away any part not used 90 days after opening the bottle  The capsule form of this medication should be stored at room temperature.   Keep out of reach of others including children and pets. Keep the medicine in the original container with a child-proof top (no pillboxes) and protected from light. Do not throw away or flush unused medication down the toilet or sink. This drug is considered hazardous and should be handled as little as possible.  If someone else helps with medication administration, they should wear gloves.      Current Medications (including OTC/herbals), Comorbidities and Allergies     Current Outpatient Medications   Medication Sig Dispense Refill    alcohol swabs (ALCOHOL PREP PADS) PadM Use three times a day as needed. 100 each 0    amlodipine (NORVASC) 10 MG tablet Take 1 tablet (10 mg total) by mouth daily. 90 tablet 3    atorvastatin (LIPITOR) 80 MG tablet Take 1 tablet (80 mg total) by mouth nightly. 90 tablet 2    BD LUER-LOK SYRINGE 3 mL 23 gauge x 1 1/2 Syrg       bimekizumab-bkzx 160 mg/mL AtIn Inject the contents of 2 pens (320mg ) every 2 weeks for the first 16 weeks followed by every 4 weeks. 4 mL 4    bimekizumab-bkzx 160 mg/mL AtIn Inject the contents of 2 pens (320mg ) subcutaneous every 4 weeks 2 mL 11    blood sugar diagnostic (GLUCOSE BLOOD) Strp Disp test strips preferred by insurance plan. Testing qday, Dx: E11.9 (Type 2 DM- controlled) 50 strip 11    blood-glucose meter kit Disp. blood glucose meter kit preferred by patient's insurance. Check blood sugars as directed by provider. Dx: Diabetes, E11.9 1 each 11    carvedilol (COREG) 6.25 MG tablet Take 1 tablet (6.25 mg total) by mouth two (2) times a day.      cetirizine (ZYRTEC) 10 MG tablet Take 1 tablet (10 mg total) by mouth daily. 30 tablet 2    clindamycin (CLEOCIN T) 1 % lotion Apply topically two (2) times a day. 60 mL 11    clindamycin (CLEOCIN) 300 MG capsule Take 1 capsule (300 mg total) by mouth two (2) times a day. 60 capsule 0    clindamycin (CLEOCIN) 300 MG capsule Take 1 capsule (300 mg total) by mouth every twelve (12) hours. 180 capsule 0    DEXCOM G7 RECEIVER Misc Use as directed. 1 each 0    DEXCOM G7 SENSOR Devi Apply Dexcom G7 sensor every 10 days. 9 each 3    empty container Misc Use as directed to dispose of Humira pens. 1 each 2    gabapentin (NEURONTIN) 300 MG capsule Take 1 capsule (300 mg total) by mouth two (2) times a day. 180 capsule 2    hydrOXYzine (ATARAX) 25 MG tablet Take 1 tablet (25 mg total) by mouth every eight (8) hours as needed for itching. 30 tablet 0  ibuprofen (MOTRIN) 600 MG tablet Take 1 tablet (600 mg total) by mouth two (2) times a day. 60 tablet 1    insulin glargine (BASAGLAR, LANTUS) 100 unit/mL (3 mL) injection pen Inject 0.3 mL (30 Units total) under the skin nightly. 15 mL 2    insulin lispro (HUMALOG) 100 unit/mL injection Inject 0-0.12 mL (0-12 Units total) under the skin.      insulin syringe-needle U-100 0.3 mL 31 gauge x 5/16 (8 mm) Syrg Use as directed 90 each 12    lancets Misc Disp. lancets #100 or amount allowed, Testing Qday. Dx: E11.9 (Diabetes- controlled) 100 each 11    larotrectinib (VITRAKVI) 100 mg capsule Take 1 capsule (100 mg total) by mouth two (2) times a day . 60 capsule 5    levothyroxine (SYNTHROID) 300 MCG tablet Take 1 tablet (300 mcg total) by mouth daily. 90 tablet 3    losartan-hydroCHLOROthiazide (HYZAAR) 100-25 mg per tablet Take 1 tablet by mouth daily.      metFORMIN (GLUCOPHAGE-XR) 500 MG 24 hr tablet Take 3 tablets (1,500 mg total) by mouth daily with evening meal. 270 tablet 2    naloxone (NARCAN) 0.4 mg/mL injection Infuse 1 mL (0.4 mg total) into a venous catheter.      nicotine (NICODERM CQ) 21 mg/24 hr patch Place 1 patch on the skin daily. Remove old patch before applying new one. 28 patch 2    nicotine polacrilex (NICORETTE) 4 MG gum Chew 1 piece (4 mg total) and park in cheek every hour as needed for smoking cessation. Max is up to 24 pieces/day. 110 each 2    ondansetron (ZOFRAN-ODT) 4 MG disintegrating tablet       pen needle, diabetic (PEN NEEDLE) 31 gauge x 5/16 (8 mm) Ndle Injection Frequency is 1 time per day 100 each 11    pen needle, diabetic (TRUEPLUS PEN NEEDLE) 32 gauge x 5/32 (4 mm) Ndle Use with insulin glargine once daily 100 each 1    rifAMPin (RIFADIN) 300 MG capsule Take 1 capsule (300 mg total) by mouth two (2) times a day. 60 capsule 2    rifAMPin (RIFADIN) 300 MG capsule Take 1 capsule (300 mg total) by mouth every twelve (12) hours. 180 capsule 0    spironolactone (ALDACTONE) 25 MG tablet Take 1 tablet (25 mg total) by mouth daily.      triamcinolone (KENALOG) 0.1 % ointment Apply topically two (2) times a day. Apply twice a day to affected areas of the skin until the skin feels smooth, then stop. 80 g 0     No current facility-administered medications for this visit.       Allergies   Allergen Reactions    Penicillin Anaphylaxis    Fish Derived     Lisinopril     Peanut Butter Flavor      Per allergy testing    Penicillins Other (See Comments)     Per patient, when he took at age 66 his legs and arms shook. Not sure if it was seizure or not.     Shellfish Containing Products     Soy        Patient Active Problem List   Diagnosis    Abnormal computed tomography angiography (CTA)    Thyroid cancer (CMS-HCC)    Obesity    Right inguinal hernia    Diabetes (CMS-HCC)    Essential hypertension    Hidradenitis suppurativa    Postoperative hypothyroidism  Medication list has been reviewed and updated in Epic: Yes    Allergies have been reviewed and updated in Epic: Yes    Appropriateness of Therapy     Acute infections noted within Epic:  No active infections  Patient reported infection: None    Is the medication and dose appropriate based on diagnosis, medication list, comorbidities, allergies, medical history, patient???s ability to self-administer the medication, and therapeutic goals? Yes    Prescription has been clinically reviewed: Yes      Baseline Quality of Life Assessment      How many days over the past month did your cancer  keep you from your normal activities? For example, brushing your teeth or getting up in the morning. 0    Financial Information     Medication Assistance provided: Prior Authorization    Anticipated copay of $4 reviewed with patient. Verified delivery address.    Delivery Information     Scheduled delivery date: 03/19/23    Expected start date: 03/19/23      Medication will be delivered via Same Day Courier to the prescription address in [facility].  This shipment will not require a signature.      Explained the services we provide at [facility] and that each month we would call to set up refills.  Stressed importance of returning phone calls so that we could ensure they receive their medications in time each month.  Informed patient that we should be setting up refills 7-10 days prior to when they will run out of medication.  A pharmacist will reach out to perform a clinical assessment periodically.  Informed patient that a welcome packet, containing information about our pharmacy and other support services, a Notice of Privacy Practices, and a drug information handout will be sent.      The patient or caregiver noted above participated in the development of this care plan and knows that they can request review of or adjustments to the care plan at any time.      Patient or caregiver verbalized understanding of the above information as well as how to contact the pharmacy at [phone] option 4 with any questions/concerns.  The pharmacy is open Monday through Friday 8:30am-4:30pm.  A pharmacist is available 24/7 via pager to answer any clinical questions they may have.    Patient Specific Needs     Does the patient have any physical, cognitive, or cultural barriers? No    Does the patient have adequate living arrangements? (i.e. the ability to store and take their medication appropriately) Yes    Did you identify any home environmental safety or security hazards? No    Patient prefers to have medications discussed with  Patient     Is the patient or caregiver able to read and understand education materials at a high school level or above? Yes    Patient's primary language is  English     Is the patient high risk? Yes, patient is taking oral chemotherapy. Appropriateness of therapy as been assessed    Does the patient have an additional or emergency contact listed in their chart? Yes    SOCIAL DETERMINANTS OF HEALTH     At the [facility], we have learned that life circumstances - like trouble affording food, housing, utilities, or transportation can affect the health of many of our patients.   That is why we wanted to ask: are you currently experiencing any life circumstances that are negatively  impacting your health and/or quality of life? No    Social Drivers of Health     Food Insecurity: No Food Insecurity (01/04/2023)    Received from [facility]    Hunger Vital Sign     Worried About Running Out of Food in the Last Year: Never true     Ran Out of Food in the Last Year: Never true   Internet Connectivity: Not on file   Housing/Utilities: Low Risk  (07/29/2020)    Housing/Utilities     Within the past 12 months, have you ever stayed: outside, in a car, in a tent, in an overnight shelter, or temporarily in someone else's home (i.e. couch-surfing)?: No     Are you worried about losing your housing?: No     Within the past 12 months, have you been unable to get utilities (heat, electricity) when it was really needed?: No   Tobacco Use: High Risk (02/20/2023)    Patient History     Smoking Tobacco Use: Every Day     Smokeless Tobacco Use: Never     Passive Exposure: Current   Transportation Needs: No Transportation Needs (01/04/2023)    Received from [facility] - Transportation     In the past 12 months, has lack of transportation kept you from medical appointments or from getting medications?: No     Lack of Transportation (Non-Medical): No   Alcohol Use: Not At Risk (01/04/2023)    Received from [facility]    AUDIT-C     Frequency of Alcohol Consumption: 4 or more times a week     Average Number of Drinks: 1 or 2     Frequency of Binge Drinking: Never   Interpersonal Safety: Not on file   Physical Activity: Inactive (01/04/2023)    Received from [facility]    Exercise Vital Sign     Days of Exercise per Week: 0 days     Minutes of Exercise per Session: 0 min   Intimate Partner Violence: Not on file   Stress: No Stress Concern Present (01/04/2023)    Received from [facility] - Occupational Stress Questionnaire     Feeling of Stress : Not at all   Substance Use: Not on file (12/06/2022)   Social Connections: Socially Isolated (01/04/2023)    Received from [facility]    Social Connection and Isolation Panel [NHANES]     Frequency of Communication with Friends and Family: More than three times a week     Frequency of Social Gatherings with Friends and Family: Never     Attends Religious Services: Never     Database administrator or Organizations: No     Attends Banker Meetings: Never     Marital Status: Never married   Physicist, medical Strain: Medium Risk (01/04/2023)    Received from [facility]    Overall Financial Resource Strain (CARDIA)     Difficulty of Paying Living Expenses: Somewhat hard   Depression: Not at risk (01/04/2023)    Received from [facility]    PHQ-2     Patient Health Questionnaire-2 Score: 1   Health Literacy: Inadequate Health Literacy (01/04/2023)    Received from [facility]    B1300 Health Literacy     Frequency of need for help with medical  instructions: Sometimes Would you be willing to receive help with any of the needs that you have identified today? Not applicable       Clydell Hakim, PharmD  [facility] Specialty Pharmacist

## 2023-03-13 NOTE — Unmapped (Addendum)
Patient with long standing HTN with elevated BP today.  Unclear if he is taking all of his medication as ordered.  Again, we had a good discussion regarding adherence to medication regimen and updated med list printed out for patient today.       He was given a pillbox today which I think will help with medication compliance.  I am still hesitant to add clonidine or hydralazine given his poor compliance and the chance of rebound hypertension.  But these are options once we get him on a consistent regimen and he still hypertensive.       Current Regimen prescribed:   --amlodipine 10mg  -  --losartan- hydrochlorothiazide 100/25  --spironolactone 25mg  daily    --Carvedilol 6.5mg  BID   - there has been confusion if he is taking this or not, today he had several pill bottles with carvedilol and says he is taking this.  At this point with his blood pressure, not unreasonable to restart.

## 2023-03-13 NOTE — Unmapped (Addendum)
Managed by derm with help from surgery. Recently seen by derm 02/20/2023.  Current regimen is below.       Current plan:  - Stopped rifampin 300 mg BID 02/20/2023 due to change in cancer tx   - Referred to Dr Janyth Contes for unroofing procedure consultation (appointment 02/19/22) - infra-abdominal and inguinal crease unroofed.   - pain clinic appointment 12/30  - Currently getting home health wound care s/p hospitalization  - Continue bimekizumab injections as instructed and clindamycin 1% cream  -Continue clindamycin to 300 mg BID  - has follow up with Derm 05/21/2023

## 2023-03-13 NOTE — Unmapped (Addendum)
patient started on Lenvima on 02/21/21.  Last CT scans on 12/20/2022 showed SD. Paul Hines has now been on hold since 12/20/22 when he was admitted for soft tissue infection requiring debridement from which he is still recovering.     Had follow up with Dr Rosana Hoes on 02/14/2023. Decision to transition from [place],  to [employer] (Larotrectinib). Continue to follow up with surg onc.

## 2023-03-13 NOTE — Unmapped (Signed)
[facility]  Established Patient Clinic Note    Assessment/Plan:   [name] is a 53 y.o.male    [name] is a 53 y.o.male with complex history to include poorly controlled DMII, thyroid cancer, HA, and HTN here for follow-up.  He was able to get Dexcom  but still remains confused on medication regimen despite numerous med rec handouts given and follow up with pharmD.      We were able to find a pill box today. I demonstrated how to fill the box and use it. I think this will help with medication compliance. Instructed him to bring to pharmD apt.   Assessment & Plan  Type 2 diabetes mellitus with hyperglycemia, with long-term current use of insulin (CMS-HCC)  Poorly controlled diabetes.  A1c done while admitted was marginally better at 13.6 compared to > 14.0 in September.   He still taking Lantus inconsistently, thinks it is around 20u daily. Last visit in Dec 2024 he was given a full print out with medications to take.       Repeat A1c  today.  Which is down to 9.1 indicating he is somewhat consistent with medications.     Due to thyroid cancer he is not a good candiate for GLP1. Will consider adding SGLT2 in the future, however, due to severe HA with several ongoing groin lesions with planned unroofing will hold off for now. Once HA undercontrol will consider adding.      Will plan to follow up in 3-4 weeks. Continue to follow with pharmD.      Current Medications:    --Metformin XR 1000mg  to 1500mg  daily -- taking 2 in the morning but none at night  --Lantus 30u daily per pharmD on 02/01/2023 -- per patient varies between 15-30u. Has not been taking consistently; new list given today  --Atorvastatin 80 mg daily -  --amlodipine 10mg    --losartan- hydrochlorothiazide 100/25     Medication changes today:   --increase lantus to 30u daily. Encouraged him to do this consistently   --New Dexcom placed today during visit.  --consider adding SGLT2 once HA is better controlled to decrease infection risk.      DMII prev med:   --Foot exam - 10/19/2022 UTD- deficits on big toes bilaterally. No major callous or ulcers. Skin intact  --Eyes- DR screening referral placed on 10/19/2022  --labs -   --A1C 9.1  Mar 14 2023;    12/21/2022 13.6.;  A1C >14 on 10/19/2022;   --Microalb/Cr (10/19/2022): 33.2; on ARB  --BP (goal <130/80)-  above goal, see below for discussion  --ASCVD risk (q73yrs if well controlled): elevated. On statin, unsure compliance            Essential hypertension  Patient with long standing HTN with elevated BP today.  Unclear if he is taking all of his medication as ordered.  Again, we had a good discussion regarding adherence to medication regimen and updated med list printed out for patient today.       He was given a pillbox today which I think will help with medication compliance.  I am still hesitant to add clonidine or hydralazine given his poor compliance and the chance of rebound hypertension.  But these are options once we get him on a consistent regimen and he still hypertensive.       Current Regimen prescribed:   --amlodipine 10mg  -  --losartan- hydrochlorothiazide 100/25  --spironolactone 25mg  daily    --Carvedilol 6.5mg  BID   -  there has been confusion if he is taking this or not, today he had several pill bottles with carvedilol and says he is taking this.  At this point with his blood pressure, not unreasonable to restart.         Hidradenitis suppurativa  Managed by derm with help from surgery. Recently seen by derm 02/20/2023.  Current regimen is below.       Current plan:  - Stopped rifampin 300 mg BID 02/20/2023 due to change in cancer tx   - Referred to Dr Janyth Contes for unroofing procedure consultation (appointment 02/19/22) - infra-abdominal and inguinal crease unroofed.   - pain clinic appointment 12/30  - Currently getting home health wound care s/p hospitalization  - Continue bimekizumab injections as instructed and clindamycin 1% cream  -Continue clindamycin to 300 mg BID  - has follow up with Derm 05/21/2023         Thyroid cancer (CMS-HCC)  patient started on Lenvima on 02/21/21.  Last CT scans on 12/20/2022 showed SD. [name] has now been on hold since 12/20/22 when he was admitted for soft tissue infection requiring debridement from which he is still recovering.     Had follow up with Dr Rosana Hoes on 02/14/2023. Decision to transition from [place],  to [employer] (Larotrectinib). Continue to follow up with surg onc.                 Attending: Karl Ito, MD     Subjective   [name] is a 53 y.o. male  coming to clinic today for the following issues:    Chief Complaint   Patient presents with    Follow-up     bp     HPI:    Here for follow-up regarding diabetes and hyper tension.  Still unsure exact regimen and he has multiple duplicate pill bottles.  He still struggles with taking medications consistently despite given medication list several times.  He has been taking insulin 20 units multiple times a week but not daily.  Last saw pharmacist who recommend going up to 30 which I agree with.    I have reviewed the problem list, medications, and allergies and have updated/reconciled them if needed.    [name]  reports that he has been smoking cigarettes. He has been exposed to tobacco smoke. He has never used smokeless tobacco.  Health Maintenance   Topic Date Due    Retinal Eye Exam  Never done    Colon Cancer Screening  Never done    Hemoglobin A1c  06/11/2023    Foot Exam  10/19/2023    Urine Albumin/Creatinine Ratio  10/19/2023    Serum Creatinine Monitoring  02/14/2024    Potassium Monitoring  02/14/2024    DTaP/Tdap/Td Vaccines (6 - Td or Tdap) 11/02/2032    Pneumococcal Vaccine 50+  Completed    Hepatitis C Screen  Completed    COVID-19 Vaccine  Completed    Influenza Vaccine  Completed    Zoster Vaccines  Completed       Objective     VITALS: BP 174/111  - Pulse 81  - Temp 36.3 ??C (97.3 ??F) (Temporal)  - Ht 170.2 cm (5' 7)  - Wt 80 kg (176 lb 6.4 oz)  - BMI 27.63 kg/m?? Physical Exam  Constitutional:       Appearance: Normal appearance. He is normal weight.   HENT:      Head: Normocephalic and atraumatic.      Nose: Nose normal.  Mouth/Throat:      Mouth: Mucous membranes are moist.   Eyes:      Extraocular Movements: Extraocular movements intact.      Conjunctiva/sclera: Conjunctivae normal.   Cardiovascular:      Rate and Rhythm: Normal rate.   Pulmonary:      Effort: Pulmonary effort is normal.   Musculoskeletal:         General: Normal range of motion.      Cervical back: Normal range of motion.   Skin:     General: Skin is warm.   Neurological:      General: No focal deficit present.      Mental Status: He is alert and oriented to person, place, and time. Mental status is at baseline.   Psychiatric:         Mood and Affect: Mood normal.         Behavior: Behavior normal.         Thought Content: Thought content normal.         Judgment: Judgment normal.         LABS/IMAGING  Office Visit on 03/14/2023   Component Date Value    HGB A1C, POC 03/14/2023 9.1 (H)     EST AVERAGE GLUCOSE, POC 03/14/2023 214        [facility]  [facility]  [address], [place], [place] [address]  Telephone [phone]  Fax [phone]  [email or web address]

## 2023-03-13 NOTE — Unmapped (Addendum)
Poorly controlled diabetes.  A1c done while admitted was marginally better at 13.6 compared to > 14.0 in September.   He still taking Lantus inconsistently, thinks it is around 20u daily. Last visit in Dec 2024 he was given a full print out with medications to take.       Repeat A1c  today.  Which is down to 9.1 indicating he is somewhat consistent with medications.     Due to thyroid cancer he is not a good candiate for GLP1. Will consider adding SGLT2 in the future, however, due to severe HA with several ongoing groin lesions with planned unroofing will hold off for now. Once HA undercontrol will consider adding.      Will plan to follow up in 3-4 weeks. Continue to follow with pharmD.      Current Medications:    --Metformin XR 1000mg  to 1500mg  daily -- taking 2 in the morning but none at night  --Lantus 30u daily per pharmD on 02/01/2023 -- per patient varies between 15-30u. Has not been taking consistently; new list given today  --Atorvastatin 80 mg daily -  --amlodipine 10mg    --losartan- hydrochlorothiazide 100/25     Medication changes today:   --increase lantus to 30u daily. Encouraged him to do this consistently   --New Dexcom placed today during visit.  --consider adding SGLT2 once HA is better controlled to decrease infection risk.      DMII prev med:   --Foot exam - 10/19/2022 UTD- deficits on big toes bilaterally. No major callous or ulcers. Skin intact  --Eyes- DR screening referral placed on 10/19/2022  --labs -   --A1C 9.1  Mar 14 2023;    12/21/2022 13.6.;  A1C >14 on 10/19/2022;   --Microalb/Cr (10/19/2022): 33.2; on ARB  --BP (goal <130/80)-  above goal, see below for discussion  --ASCVD risk (q51yrs if well controlled): elevated. On statin, unsure compliance

## 2023-03-14 ENCOUNTER — Ambulatory Visit: Admit: 2023-03-14 | Discharge: 2023-03-15 | Payer: PRIVATE HEALTH INSURANCE

## 2023-03-14 NOTE — Unmapped (Addendum)
Current Medications:   Diabetes:    --Metformin 500mg  2 tabs twice a day (morning and night)   --Lantus 30 units daily   --gabapentin 300 mg tablet twice a day (AM and PM)  --watch sugars. If over 250 on dexcom consistently send me a message     Cholesterol  --Atorvastatin 80 mg daily      Blood pressure:   --amlodipine 10mg  daily   --losartan- hydrochlorothiazide 100/25  daily   --spironolactone 25mg  daily   --carvedilol 6.25mg  twice a day (once in the morning and once in the afternoon)     Hidradenitis (skin infections)  --bimekizumab injections 160 mg/mL Atin, Inject the contents of 2 pens (320mg ) every 2 weeks for the first 16 weeks followed by every 4 weeks.  --STOP taking rifampin   --clindamycin to 300 mg twice daily (AM and PM)  --clindamycin (CLEOCIN T) 1 % lotion; Apply topically two (2) times a day.  --certirizine 10mg  daily   -- Hibiclens wash qday in shower     Thyroid medication:   --levothyroxine 300 mcg daily         --follow up with Pharmacist   03/22/2023 10:00 AM Paul Hines

## 2023-03-15 DIAGNOSIS — E119 Type 2 diabetes mellitus without complications: Principal | ICD-10-CM

## 2023-03-15 DIAGNOSIS — Z794 Long term (current) use of insulin: Principal | ICD-10-CM

## 2023-03-15 MED ORDER — DEXCOM G7 RECEIVER
0 refills | 0.00 days
Start: 2023-03-15 — End: ?

## 2023-03-15 NOTE — Unmapped (Signed)
Mr. Busche came to the resource center and told staff he was having issues getting his cancer pills. We reached out to his nurse navigator and was told he does have refills and that he would need to call the specialty home pharmacy. We assisted him call the pharmacy and request that his medications (the oral chemotherapy pills as well as his regular medications for hypertension, diabetes, hypothyroid and skin irritation) be delivered to his house. We spoke with the pharmacist, Noreene Larsson, with Mr. Morandi on speakerphone, meds to be delivered Monday 2/17. Mr Boomhower plans to take the medications as directed and will return for follow lab appointment 3/5.

## 2023-03-15 NOTE — Unmapped (Signed)
[facility] Clinical Assessment & Refill Coordination Note    JENNER Hines, DOB: 1970/05/03  Phone: [phone] (home)     All above HIPAA information was verified with patient.     Was a Nurse, learning disability used for this call? No    Specialty Medication(s):   Inflammatory Disorders: Bimzelx     Current Outpatient Medications   Medication Sig Dispense Refill    alcohol swabs (ALCOHOL PREP PADS) PadM Use three times a day as needed. 100 each 0    amlodipine (NORVASC) 10 MG tablet Take 1 tablet (10 mg total) by mouth daily. 90 tablet 3    atorvastatin (LIPITOR) 80 MG tablet Take 1 tablet (80 mg total) by mouth nightly. 90 tablet 2    BD LUER-LOK SYRINGE 3 mL 23 gauge x 1 1/2 Syrg       bimekizumab-bkzx 160 mg/mL AtIn Inject the contents of 2 pens (320mg ) every 2 weeks for the first 16 weeks followed by every 4 weeks. 4 mL 4    bimekizumab-bkzx 160 mg/mL AtIn Inject the contents of 2 pens (320mg ) subcutaneous every 4 weeks 2 mL 11    blood sugar diagnostic (GLUCOSE BLOOD) Strp Disp test strips preferred by insurance plan. Testing qday, Dx: E11.9 (Type 2 DM- controlled) 50 strip 11    blood-glucose meter kit Disp. blood glucose meter kit preferred by patient's insurance. Check blood sugars as directed by provider. Dx: Diabetes, E11.9 1 each 11    carvedilol (COREG) 6.25 MG tablet Take 1 tablet (6.25 mg total) by mouth two (2) times a day.      cetirizine (ZYRTEC) 10 MG tablet Take 1 tablet (10 mg total) by mouth daily. 30 tablet 2    clindamycin (CLEOCIN T) 1 % lotion Apply topically two (2) times a day. 60 mL 11    clindamycin (CLEOCIN) 300 MG capsule Take 1 capsule (300 mg total) by mouth two (2) times a day. 60 capsule 0    clindamycin (CLEOCIN) 300 MG capsule Take 1 capsule (300 mg total) by mouth every twelve (12) hours. 180 capsule 0    DEXCOM G7 RECEIVER Misc Use as directed. 1 each 0    DEXCOM G7 SENSOR Devi Apply Dexcom G7 sensor every 10 days. 9 each 3    empty container Misc Use as directed to dispose of Humira pens. 1 each 2    gabapentin (NEURONTIN) 300 MG capsule Take 1 capsule (300 mg total) by mouth two (2) times a day. 180 capsule 2    hydrOXYzine (ATARAX) 25 MG tablet Take 1 tablet (25 mg total) by mouth every eight (8) hours as needed for itching. 30 tablet 0    ibuprofen (MOTRIN) 600 MG tablet Take 1 tablet (600 mg total) by mouth two (2) times a day. 60 tablet 1    insulin glargine (BASAGLAR, LANTUS) 100 unit/mL (3 mL) injection pen Inject 0.3 mL (30 Units total) under the skin nightly. 15 mL 2    insulin lispro (HUMALOG) 100 unit/mL injection Inject 0-0.12 mL (0-12 Units total) under the skin.      insulin syringe-needle U-100 0.3 mL 31 gauge x 5/16 (8 mm) Syrg Use as directed 90 each 12    lancets Misc Disp. lancets #100 or amount allowed, Testing Qday. Dx: E11.9 (Diabetes- controlled) 100 each 11    larotrectinib (VITRAKVI) 100 mg capsule Take 1 capsule (100 mg total) by mouth two (2) times a day . 60 capsule 5    levothyroxine (  SYNTHROID) 300 MCG tablet Take 1 tablet (300 mcg total) by mouth daily. 90 tablet 3    losartan-hydroCHLOROthiazide (HYZAAR) 100-25 mg per tablet Take 1 tablet by mouth daily.      metFORMIN (GLUCOPHAGE-XR) 500 MG 24 hr tablet Take 3 tablets (1,500 mg total) by mouth daily with evening meal. 270 tablet 2    naloxone (NARCAN) 0.4 mg/mL injection Infuse 1 mL (0.4 mg total) into a venous catheter.      nicotine (NICODERM CQ) 21 mg/24 hr patch Place 1 patch on the skin daily. Remove old patch before applying new one. 28 patch 2    nicotine polacrilex (NICORETTE) 4 MG gum Chew 1 piece (4 mg total) and park in cheek every hour as needed for smoking cessation. Max is up to 24 pieces/day. 110 each 2    ondansetron (ZOFRAN-ODT) 4 MG disintegrating tablet       pen needle, diabetic (PEN NEEDLE) 31 gauge x 5/16 (8 mm) Ndle Injection Frequency is 1 time per day 100 each 11    pen needle, diabetic (TRUEPLUS PEN NEEDLE) 32 gauge x 5/32 (4 mm) Ndle Use with insulin glargine once daily 100 each 1    spironolactone (ALDACTONE) 25 MG tablet Take 1 tablet (25 mg total) by mouth daily.      triamcinolone (KENALOG) 0.1 % ointment Apply topically two (2) times a day. Apply twice a day to affected areas of the skin until the skin feels smooth, then stop. 80 g 0     No current facility-administered medications for this visit.        Changes to medications: Karlon reports no changes at this time.    Medication list has been reviewed and updated in Epic: Yes    Allergies   Allergen Reactions    Penicillin Anaphylaxis    Fish Derived     Lisinopril     Peanut Butter Flavor      Per allergy testing    Penicillins Other (See Comments)     Per patient, when he took at age 53 his legs and arms shook. Not sure if it was seizure or not.     Shellfish Containing Products     Soy        Changes to allergies: No    Allergies have been reviewed and updated in Epic: Yes    SPECIALTY MEDICATION ADHERENCE     Bimzelx 160 mg/ml: 0 days of medicine on hand      Medication Adherence    Patient reported X missed doses in the last month: 0  Specialty Medication: bimzelx          Specialty medication(s) dose(s) confirmed: Regimen is correct and unchanged.     Are there any concerns with adherence? Yes: very difficult to reach patient    Adherence counseling provided? Yes: please call back or call us when running low    CLINICAL MANAGEMENT AND INTERVENTION      Clinical Benefit Assessment:    Do you feel the medicine is effective or helping your condition? Yes    Clinical Benefit counseling provided? Not needed    Adverse Effects Assessment:    Are you experiencing any side effects? No    Are you experiencing difficulty administering your medicine? No    Quality of Life Assessment:    Quality of Life    Rheumatology  Oncology  Dermatology  Cystic Fibrosis          How many days over the past  month did your condition/medication  keep you from your normal activities? For example, brushing your teeth or getting up in the morning. 0    Have you discussed this with your provider? Not needed    Acute Infection Status:    Acute infections noted within Epic:  No active infections  Patient reported infection: None    Therapy Appropriateness:    Is therapy appropriate based on current medication list, adverse reactions, adherence, clinical benefit and progress toward achieving therapeutic goals? Yes, therapy is appropriate and should be continued     DISEASE/MEDICATION-SPECIFIC INFORMATION      N/A    Oncology: Is the patient receiving adequate infection prevention treatment? Not applicable  Does the patient have adequate nutritional support? Not applicable    PATIENT SPECIFIC NEEDS     Does the patient have any physical, cognitive, or cultural barriers? No    Is the patient high risk? Yes, patient is taking oral chemotherapy. Appropriateness of therapy as been assessed    Did the patient require a clinical intervention? No    Does the patient require physician intervention or other additional services (i.e., nutrition, smoking cessation, social work)? No    Does the patient have an additional or emergency contact listed in their chart? Yes    SOCIAL DETERMINANTS OF HEALTH     At the [facility], we have learned that life circumstances - like trouble affording food, housing, utilities, or transportation can affect the health of many of our patients.   That is why we wanted to ask: are you currently experiencing any life circumstances that are negatively impacting your health and/or quality of life? No    Social Drivers of Health     Food Insecurity: No Food Insecurity (01/04/2023)    Received from [facility]    Hunger Vital Sign     Worried About Running Out of Food in the Last Year: Never true     Ran Out of Food in the Last Year: Never true   Internet Connectivity: Not on file   Housing/Utilities: Low Risk  (07/29/2020)    Housing/Utilities     Within the past 12 months, have you ever stayed: outside, in a car, in a tent, in an overnight shelter, or temporarily in someone else's home (i.e. couch-surfing)?: No     Are you worried about losing your housing?: No     Within the past 12 months, have you been unable to get utilities (heat, electricity) when it was really needed?: No   Tobacco Use: High Risk (03/14/2023)    Patient History     Smoking Tobacco Use: Every Day     Smokeless Tobacco Use: Never     Passive Exposure: Current   Transportation Needs: No Transportation Needs (01/04/2023)    Received from [facility] - Transportation     In the past 12 months, has lack of transportation kept you from medical appointments or from getting medications?: No     Lack of Transportation (Non-Medical): No   Alcohol Use: Not At Risk (01/04/2023)    Received from [facility]    AUDIT-C     Frequency of Alcohol Consumption: 4 or more times a week     Average Number of Drinks: 1 or 2     Frequency of Binge Drinking: Never   Interpersonal Safety: Not on file   Physical Activity: Inactive (01/04/2023)    Received from [facility]    Exercise Vital Sign     Days of Exercise per Week: 0 days     Minutes of Exercise per Session: 0 min   Intimate Partner Violence: Not on file   Stress: No Stress Concern Present (01/04/2023)    Received from [facility] - Occupational Stress Questionnaire     Feeling of Stress : Not at all   Substance Use: Not on file (12/06/2022)   Social Connections: Socially Isolated (01/04/2023)    Received from [facility]    Social Connection and Isolation Panel [NHANES]     Frequency of Communication with Friends and Family: More than three times a week     Frequency of Social Gatherings with Friends and Family: Never     Attends Religious Services: Never     Database administrator or Organizations: No     Attends Engineer, structural: Never     Marital Status: Never married   Physicist, medical Strain: Medium Risk (01/04/2023)    Received from [facility]    Overall Financial Resource Strain (CARDIA)     Difficulty of Paying Living Expenses: Somewhat hard   Depression: Not at risk (01/04/2023)    Received from [facility]    PHQ-2     Patient Health Questionnaire-2 Score: 1   Health Literacy: Inadequate Health Literacy (01/04/2023)    Received from [facility]    [phone] Health Literacy     Frequency of need for help with medical instructions: Sometimes       Would you be willing to receive help with any of the needs that you have identified today? Not applicable       SHIPPING     Specialty Medication(s) to be Shipped:   Hematology/Oncology: Vitrakvi and Inflammatory Disorders: Bimzelx    Other medication(s) to be shipped: sharps container, test strips, lancets,amlodipine,atorvastatin,cetirizine, dexcom 7 receiver and sensor, gabapentin,hydroxyzine,ibuprofen,lantus,levothyroxine,metformin, triamcinolone, pen needles     Changes to insurance: No    Delivery Scheduled: Yes, Expected medication delivery date: 03/19/23.     Medication will be delivered via Same Day Courier to the confirmed prescription address in [facility].    The patient will receive a drug information handout for each medication shipped and additional FDA Medication Guides as required.  Verified that patient has previously received a Conservation officer, historic buildings and a Surveyor, mining.    The patient or caregiver noted above participated in the development of this care plan and knows that they can request review of or adjustments to the care plan at any time.      All of the patient's questions and concerns have been addressed.    Rollen Sox, [facility]   [facility] Specialty Pharmacist

## 2023-03-15 NOTE — Unmapped (Signed)
[facility] Other Call ONC Phone Room Smart Lists: Medication Refill -     Hi,    Patient Paul Hines called requesting a medication refill for the following:    [name] states that a new cancer medication was prescribed, he is not sure the name of it. Pharmacy is saying they do not have it.  [facility]  [address], [place] [address]  Phone: [phone]  Fax: [phone]     Please call [name] asap at [phone], he is at [facility] so he would like to pick it up today    Thank you,  Yehuda Mao  [facility]  [phone]    [facility]

## 2023-03-16 DIAGNOSIS — C73 Malignant neoplasm of thyroid gland: Principal | ICD-10-CM

## 2023-03-16 MED ORDER — DEXCOM G7 RECEIVER
0 refills | 0.00 days | Status: CP
Start: 2023-03-16 — End: ?

## 2023-03-18 DIAGNOSIS — Z794 Long term (current) use of insulin: Principal | ICD-10-CM

## 2023-03-18 DIAGNOSIS — E119 Type 2 diabetes mellitus without complications: Principal | ICD-10-CM

## 2023-03-19 MED ORDER — ALCOHOL SWABS
0 refills | 0.00 days
Start: 2023-03-19 — End: ?

## 2023-03-19 MED FILL — DEXCOM G7 SENSOR DEVICE: 90 days supply | Qty: 9 | Fill #1

## 2023-03-19 MED FILL — DEXCOM G7 RECEIVER: 90 days supply | Qty: 1 | Fill #0

## 2023-03-19 MED FILL — EMPTY CONTAINER: 120 days supply | Qty: 1 | Fill #2

## 2023-03-19 MED FILL — IBUPROFEN 600 MG TABLET: ORAL | 30 days supply | Qty: 60 | Fill #1

## 2023-03-19 MED FILL — AMLODIPINE 10 MG TABLET: ORAL | 90 days supply | Qty: 90 | Fill #1

## 2023-03-19 MED FILL — LEVOTHYROXINE 300 MCG TABLET: ORAL | 90 days supply | Qty: 90 | Fill #0

## 2023-03-19 MED FILL — TRUEPLUS PEN NEEDLE 31 GAUGE X 5/16" (8 MM): 90 days supply | Qty: 100 | Fill #1

## 2023-03-19 MED FILL — ACCU-CHEK SOFTCLIX LANCETS: 100 days supply | Qty: 100 | Fill #1

## 2023-03-19 MED FILL — BIMZELX AUTOINJECTOR 160 MG/ML SUBCUTANEOUS AUTO-INJECTOR: 28 days supply | Qty: 2 | Fill #1

## 2023-03-19 MED FILL — GABAPENTIN 300 MG CAPSULE: ORAL | 90 days supply | Qty: 180 | Fill #1

## 2023-03-19 MED FILL — ATORVASTATIN 80 MG TABLET: ORAL | 90 days supply | Qty: 90 | Fill #0

## 2023-03-19 MED FILL — ACCU-CHEK GUIDE TEST STRIPS: 50 days supply | Qty: 50 | Fill #1

## 2023-03-20 MED ORDER — ALCOHOL SWABS
0 refills | 0.00 days | Status: CP
Start: 2023-03-20 — End: ?

## 2023-03-20 NOTE — Unmapped (Signed)
 Provided follow-up counseling to Paul Hines, 53 y.o. for treatment of tobacco use/dependence.     SUMMARY: SW re-assessed pt's tobacco use status and followed up about treatment plan. Pt endorsed current daily tobacco use of 3 cpd. Pt expressed continued interest and motivation in tobacco cessation. Pt is still using nicotine patches and gum with success. Pt stated he feels well supported and is proud of his success thus far. SW offered support and encouragement. This was the pt's final session with this SW, and pt was provided program contact information for future opportunities to engage in tobacco treatment support.      Tobacco Use Treatment  Program: [facility]  Type of Visit: Follow-up  Session Number: 9  Tobacco Use Treatment Visit: Talked with patient  Permission To Engage In Conversation Re: PHI w/ Visitors Present: n/a  Goals Of Session: Insight, increase, Assessment, Communication of feelings    Cancer Center Patients  Primary Service: Other (TA)  Primary Cancer Type: Head and Neck    Tobacco Use During Past 30 Days  Time Since Last Tobacco Use: smoked a cigarette today (at least one puff)  Tobacco Withdrawal (Past 24 Hours): None noted  Type of Tobacco Products Used: Cigarettes  Quantity Used: 3  Quantity Per: day  Relight Cigarettes: Yes  Time to First Use After Waking: >60 minutes  Wake During Sleep to Smoke: Never  Other Household Members Use Tobacco: Yes  Smoking Allowed in Home: Yes  Smoking Allowed in Vehicles: N/A  Smoke During Work Day: N/A    Tobacco Use History  Most Recent Attempt: Currently working on  Medications Used in Past Attempts: None    Behavioral Assessment  Why Uses: 1. Long Standing Habit 2. Stress  Reasons to Become Tobacco Free: 1. Health  Barriers/Challenges: 1. Habit 2. Stress  Strategies: 1. patch and gum 2. behaioral friction 3. mindfulness    Treatment Plan  Cessation Meds Currently Using: Patch 21mg , Gum 4mg    Outpatient/Discharge Medications Recommended: Gum 4mg , Patch 21mg   Plan to Obtain Outpatient Meds: Pt already has medication  Patient's Plan Post Discharge/Visit: Plan to quit as soon as possible  Follow-up Plan: Pt would prefer to contact counselor  Family Members Included in Intervention/Plan: No    TTS Information  Diagnosis: Tobacco use disorder, unspecified, uncomplicated (F17.200)  Interventions: Assessed, Discussed, Motivational interviewing, Encouraged  TTS Visit Length: 3-10 minutes    Andee Lineman, LCSW-A  Tobacco Treatment Counselor  [facility]  [phone]

## 2023-03-21 MED FILL — DOXYCYCLINE HYCLATE 100 MG TABLET: ORAL | 34 days supply | Qty: 68 | Fill #0

## 2023-03-21 MED FILL — NICOTINE 21 MG/24 HR DAILY TRANSDERMAL PATCH: TRANSDERMAL | 28 days supply | Qty: 28 | Fill #2

## 2023-03-21 NOTE — Unmapped (Unsigned)
 Subjective     Reason for visit:    Paul Hines is a 53 y.o. 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  Patient presents to this visit alone.    Known DM Complications: peripheral neuropathy    Date of Last Diabetes Related Visit: 02/01/23 with CPP, 03/14/23 with PCP    Action At Last Diabetes Related Visit:    Change Lantus insulin to 30 units once daily (pt reported taking 40 units half the days of the week and 20 units the other days)  Increase metformin XR from 1000 to 1500 mg daily  Continue BP med regimen: amlodipine 10 mg daily, losartan-HCTZ 100-25 mg daily, spironolactone 25 mg daily, carvedilol 6.25 mg BID  Recommended pt start using nicotine 21 mg/24h patch more consistently + continue nicotine 4 mg gum PRN cravings  Continue atorvastatin 80 mg daily  Encouraged pt to schedule eye exam and provided phone # for [facility] ophtho (referral placed previously)  Encouraged pt to strongly consider pill packs but deferred making this transition to future visit per pt preference  Gave pt pillbox at PCP visit    Since Last visit / History of Present Illness:    Patient reports fully implementing plan from last visit. ***    Questions/notes for today:  - Told to stop rifampin since change in cancer tx 02/20/23    - Review Dexcom CGM?  - S/sx high or low BG?  - Confirm Lantus insulin dose (pt previously has reported varying dose 15-40 units)?  - Adherence/tolerability of increased metformin XR (okay to take dose all at once)?  - Future: GLP-1 (non-medullary thyroid cancer hx) or TZD (as long as no hx CHF or bladder cancer)   - However A1c did improve significantly recently with metformin + basal insulin so may defer adding other agents for now?    - Clinic BP?  - Meds taken before visit?  - Taper off spiro if able to simplify med regimen (keep carvedilol given hx non-obstructive CAD?)?  - Longer-acting ARB or thiazide (although would like to utilize combo pills if possible)?    - Pillbox education?  - Missed doses?  - Reconsider transition to pill packs?  - Reduce # of copays?   - Amlod/atorva or amlod/valsartan/HCTZ (Exforge)   - Taper off spiro if able    - Clarify duration of clinda/rifampin?    - Smoking cessation?    - Still needs eye exam?  - [place] pharmacy needs credit card on file to ship meds?    Reported HTN Regimen:  Amlodipine 10 mg daily  Losartan-HCTZ 100-25 mg daily  Carvedilol 6.25 mg BID  Spironolactone 25 mg daily    Home BP Monitoring:  Does not check BP at home. Denies s/sx orthostasis. Denies s/sx elevated BP, including headaches or stroke sx. Endorses some blurry vision when reading at times, though unsure if related to BP or BG.    Reported DM Regimen:    Metformin XR 1500 mg qAM  Lantus 30 units daily    DM medications tried in the past:   Glipizide  Insulin NPH    Medication Adherence and Access:  Since last visit, patient reports missing 2nd dose of insulin (note: pt thought insulin was intended to be BID since last PCP visit as stated above) ~3 days in the past week, otherwise  has gotten at least 1 dose of insulin every day. Also does think he has missed some doses of rifampin and clindamycin, though unsure how many or how often.    Does not currently use a pillbox. Has never been informed about pill packs previously but would be open to this in the future, not ready to make this switch today (since would need to change pharmacies).    Biggest reason for missing doses of meds is due to simply forgetting to take. Phone does have alarm capabilities but doesn't think this would help. Tries to have routine of taking meds right away in AM after waking.    Has not taken any meds yet this AM as he was running behind to get to clinic appointment.    Has pill bottles of both atorvastatin 40 mg + 80 mg tablets, will take only 1 of them but dose depends on which one he happens to see first that day. Also has pill bottles for carvedilol 6.25 mg tablets and spironolactone 25 mg tablets, which were previously thought to be not in current regimen as pt did not have bottles for these at past visits.    SMBG Per patient memory:    Using Dexcom G7 CGM as instructed (new as of mid-December 2024). Forgot CGM Receiver at home today so reports glucose values from memory.    For past ~2 weeks:  This AM: 150  Average AM: 130-150  Highest: 300s - CGM alarms for >250, almost every night at bedtime  Lowest: 126    Hypoglycemia:    Symptoms of hypoglycemia since last visit:  no  If yes, it was treated by: n/a    DM Prevention:  Statin: Taking; high intensity (atorvastatin 80 mg)  Aspirin - unclear if indicated; Not taking    ACEI/ARB - Taking (losartan 100 mg daily - in combo pill w/ HCTZ 25 mg); Urine MA/CR Ratio - elevated urinary albumin excretion of 33.2 ug/g (last checked 10/19/22).  Last eye exam: unknown - DUE  Last foot exam: 10/19/22   Tobacco Use: Current smoker  Immunizations:   Immunization History   Administered Date(s) Administered    COVID-19 VAC,BIVALENT,MODERNA(BLUE CAP) 11/09/2020    COVID-19 VACCINE,MRNA(MODERNA)(PF) 03/06/2019, 04/03/2019, 12/18/2019, 06/08/2020    Covid-19 Vac, (61yr+) (Comirnaty) Mrna Pfizer  10/19/2022    DTaP, Unspecified Formulation 12/13/1971, 02/09/1972, 04/26/1972, 10/03/1973    INFLUENZA VACCINE IIV3(IM)(PF)6 MOS UP 10/19/2022    Influenza Vaccine Quad(IM)6 MO-Adult(PF) 12/02/2020    Influenza Virus Vaccine, unspecified formulation 11/03/2020    Measles 12/13/1971    Mumps 03/28/1974    Pneumococcal Conjugate 20-valent 11/03/2022    Polio Virus Vaccine, Unspecified Formulation 12/13/1971, 02/09/1972, 04/26/1972, 10/03/1973    Rubella 12/13/1971    SHINGRIX-ZOSTER VACCINE (HZV),RECOMBINANT,ADJUVANTED(IM) 11/03/2022, 03/14/2023    TdaP 11/03/2022     _________________________________________________    Past Medical History: reviewed PMH in epic today    Social History:  Social History     Tobacco Use   Smoking Status Every Day    Types: Cigarettes    Passive exposure: Current   Smokeless Tobacco Never       Medications: Medications reviewed in EPIC medication station and updated today by the clinical Insurance underwriter.         Objective   Vitals:    There were no vitals filed for this visit. ***     Wt Readings from Last 3 Encounters:   03/14/23 80 kg (176 lb 6.4 oz)   02/20/23 81.8 kg (180  lb 5 oz)   02/14/23 78.5 kg (173 lb)       There is no height or weight on file to calculate BMI.    The 10-year ASCVD risk score (Arnett DK, et al., 2019) is: 48.9%    Values used to calculate the score:      Age: 25 years      Sex: Male      Is Non-Hispanic African American: Yes      Diabetic: Yes      Tobacco smoker: Yes      Systolic Blood Pressure: 174 mmHg      Is BP treated: Yes      HDL Cholesterol: 71 mg/dL      Total Cholesterol: 261 mg/dL    Note: For patients with SBP <90 or >200, Total Cholesterol <130 or >320, HDL <20 or >100 which are outside of the allowable range, the calculator will use these upper or lower values to calculate the patient???s risk score.      Labs:   Lab Results   Component Value Date    A1C 9.1 (H) 03/14/2023    A1C >14.0 (H) 10/19/2022    A1C 11.4 (H) 06/20/2022     Per CareEverywhere: 13.6% (12/21/22)      Assessment/Plan:    1. Diabetes, type 2: uncontrolled per last A1c 9.1% (03/14/23), which is improved from 13.6% in Nov 2024 (per CareEverywhere). Goal <7% per ADA guidelines. *** Unable to assess CGM data since pt forget CGM Receiver at home, but per pt report, appears still persistent hyperglycemia with fasting glucose values typically above goal 80-130 and nearly daily having glucose readings up into the 300s later in the day, which is well above top of goal range <180. Somewhat difficult to know how to adjust insulin since pt misunderstood instructions and has been taking total daily dose of 40 units about half the days in the past week and 20 units the other half of the days, therefore, will attempt to meet somewhere in the middle for now to see how glucose readings steady out and reviewed he really does only need to give basal insulin once daily given 24-hour duration of action. To continued to work towards improved BG control, will also titrate metformin further and encouraged pt to stick w/ XR tabs so he can take the full dose at the same time.  Change Lantus insulin to 30 units once daily   Increase metformin XR from 1000 to 1500 mg daily  Repeat A1c due 03/23/23  Reviewed symptoms and treatment of hypoglycemia, including need to check BG prior to and following treatment.  SMBG instructions:  use Dexcom G7 CGM (including receiver) as instructed (change sensors every 10 days)  Future considerations:  Caution w/ SGLT2i given recent SSTI in inguinal region and uncontrolled HS affecting inguinal region  Could still consider GLP-1 RA given non-medullary thyroid carcinomas not a contraindication and have unclear association - also potential to improve CKD/CV-related outcomes given microalbuminuria present (if SGLT2i not pursued)  If challenges with injections, then possibly TZD (e.g. pioglitazone) - reasonable as long as no CHF or bladder cancer history    2. Hypertension: uncontrolled based on today's clinic BP of 157/97 (HR 86), although pt has not yet taken any meds (including BP meds) today. Goal <130/80 mmHg per ADA guidelines. Difficult to assess BP control on current med regimen since pt did not take meds before visit today. Therefore, will defer any med changes today but did update med list  with carvedilol and spironolactone that were previously thought to have been self-discontinued. Discussed several strategies for helping to improve med adherence as well, which I suspect is playing a large role in poor BP and BG control.  Continue amlodipine 10 mg daily  Continue losartan-HCTZ 100-25 mg daily  Continue spironolactone 25 mg daily  Continue carvedilol 6.25 mg BID  Future considerations:  Longer-acting ARB (e.g. irbesartan, telmisartan, olmesartan) and/or thiazide (e.g. chlorthalidone or indapamide) if concern for wearing off effect    3. Non-obstructive CAD - ASCVD risk reduction: last LDL elevated at 145 (10/19/22). Goal LDL <70 given DM + ASCVD risk >20% (as well as notable history of non-obstructive CAD). Appropriately on high intensity statin, though unclear adherence. May need additional non-statin LDL lowering therapies in the future but agreed to defer for now with focus on consistent adherence to current statin regimen. Briefly discussed smoking cessation as well and encouraged pt to trial nicotine patch in addition to PRN use of nicotine gum to help reduce cigarette cravings throughout the day and work towards complete cessation.  Continue atorvastatin 80 mg daily  Recommended pt start using nicotine 21 mg/24h patch more consistently + continue nicotine 4 mg gum PRN cravings  Future considerations:  Add PO ezetimibe if LDL remains above goal despite confirmed adherence w/ max dose statin    4. Medication management and care coordination:  Discarded pill bottles for metformin IR and atorvastatin 40 mg tablets and recommend pt use metformin XR and atorvastatin 80 mg tablets exclusively.  Sent message to [facility] with request to transfer Rx's for clindamycin + rifampin from [facility] to streamline which pharmacy pt will get refills from going forward.  Encouraged pt to schedule eye exam, appears ophtho referral previously placed and pt had appt but did not show so explained he should be able to call and reschedule. Provided pt with phone # for [facility] ophtho.  Given continued concerns with med adherence and management, would strongly consider pursuing pill packs. Confirmed with [facility] they would be able to provide pill packs and deliver to pt at no additional charge. Per pt preference, deferred making this transition now but would continue to revisit with pt at future follow-up visits.  Created and printed out med action plan for patient that includes names of all meds, dosing, and indications as listed below:  Name of Medication Morning Evening What It's For   Amlodipine  10 mg tablet 1 tablet  High blood pressure   Atorvastatin  80 mg tablet 1 tablet  High cholesterol  Prevent future heart attack and stroke   Carvedilol  6.25 mg tablet 1 tablet 1 tablet High blood pressure   Cetirizine  10 mg tablet 1 tablet  Allergies and itching   Clindamycin  300 mg capsule 1 capsule 1 capsule Skin infection   Rifampin  300 mg capsule 1 capsule 1 capsule Skin infection   Levothyroxine  300 mcg tablet 1 tablet  Thyroid supplement   Losartan-HCTZ  100-25 mg tablet 1 tablet  High blood pressure   Lantus insulin Inject 30 units once daily. Diabetes   Metformin XR  500 mg tablet 3 tablets  Diabetes   Spironolactone  25 mg tablet 1 tablet  High blood pressure   Bimzelx injection Inject 2 pens (320 mg) every 2 weeks for 16 weeks. Hidradenitis suppurativa   As Needed Medications   Hydroxyzine  25 mg tablet Take 1 tablet (25 mg) by mouth every 8 hours as  needed. Itching   Ibuprofen  600 mg tablet Take 1 tablet (600 mg) by mouth twice daily as needed. Pain   Gabapentin  300 mg capsule Take 1 capsule (300 mg) by mouth twice daily. Pain   Nicotine  21 mg/24 hour patch Place 1 patch on the skin daily. Decrease cigarette cravings   Nicotine  4 mg gum Chew and park 1 piece of gum in cheek every 1 hour as needed. Cigarette cravings       Follow-up: end of January with PCP as scheduled and ~2-3 weeks after that with CPP    Future Appointments   Date Time Provider Department Center   03/22/2023 10:00 AM Melba Coon, CPP [facility]   04/04/2023 12:30 PM ADULT ONC LAB [facility]   04/04/2023  1:40 PM Ginette Otto [place], DO [facility]   04/11/2023  3:40 PM Vernelle Emerald, DO [facility]   05/29/2023  1:00 PM Elsie Stain, MD [facility]       I spent a total of 40 minutes face to face with the patient delivering clinical care and providing education/counseling.  _________________________________________________  Damita Dunnings, PharmD, CPP, [facility]  Family Medicine Clinical Pharmacist

## 2023-03-21 NOTE — Unmapped (Incomplete)
 Call [facility] for medication refills:  [phone] (option #4)    Schedule eye exam at [facility] eye center:  [facility] [address]   Phone number: [phone]    Diabetes medications:  Lantus insulin 30 units once daily  Metformin XR 3 tablets (1500 mg) once daily - INCREASE!    How often and when to check blood sugar:  Use Dexcom G7 continuous glucose monitor (CGM) as instructed. Change sensors every 10 days.    How to treat low blood sugar:  For blood sugar less than 70 --> Treat with 4 ounces of juice or regular soda, or with 3 to 4 glucose tablets. Re-check blood sugar in 15 minutes.    If blood sugar is still less than 70 on re-check, treat again and re-check in 15 minutes.   If blood sugar is over 70 on re-check and it is time to eat your regular meal, eat regular meal and take insulin as prescribed for that blood sugar.    When to follow-up:  Thursday, February 20th at 10am with Tobi Bastos (pharmacist)      Damita Dunnings, PharmD, CPP, [facility]  Family Medicine Clinical Pharmacist  Clinic Phone: [phone]      Name of Medication Morning Evening What It's For   Amlodipine  10 mg tablet 1 tablet  High blood pressure   Atorvastatin  80 mg tablet 1 tablet  High cholesterol  Prevent future heart attack and stroke   Carvedilol  6.25 mg tablet 1 tablet 1 tablet High blood pressure   Cetirizine  10 mg tablet 1 tablet  Allergies and itching   Clindamycin  300 mg capsule 1 capsule 1 capsule Skin infection   Levothyroxine  300 mcg tablet 1 tablet  Thyroid supplement   Losartan-HCTZ  100-25 mg tablet 1 tablet  High blood pressure   Lantus insulin Inject 30 units once daily. Diabetes   Metformin XR  500 mg tablet 3 tablets  Diabetes   Spironolactone  25 mg tablet 1 tablet  High blood pressure   Bimzelx injection Inject 2 pens (320 mg) every 2 weeks for 16 weeks. Hidradenitis suppurativa   As Needed Medications   Hydroxyzine  25 mg tablet Take 1 tablet (25 mg) by mouth every 8 hours as needed. Itching   Ibuprofen  600 mg tablet Take 1 tablet (600 mg) by mouth twice daily as needed. Pain   Gabapentin  300 mg capsule Take 1 capsule (300 mg) by mouth twice daily. Pain   Nicotine  21 mg/24 hour patch Place 1 patch on the skin daily. Decrease cigarette cravings   Nicotine  4 mg gum Chew and park 1 piece of gum in cheek every 1 hour as needed. Cigarette cravings

## 2023-04-03 NOTE — Unmapped (Unsigned)
 Clinical Pharmacist Practitioner: [facility]    Patient Name: Paul Hines  Patient Age: 53 y.o.  Encounter Date: 04/04/2023  Primary Oncologist: Ginette Otto, DO, MPH    Reason for visit: oral targeted therapy follow-up    ASSESSMENT & PLAN:  1. Metastatic Papillary Thyroid Cancer, BRAF WT: started on NTRK1 targeted therapy for NTRK1 gene fusion approximately ~4 weeks ago. He is tolerating treatment well ***. CMP and CBC w/diff were reviewed and acceptable for continuing treatment ***.  Continue on  2.     F/U: ***    ______________________________________________________________________    HPI: Paul Hines is a 53 y.o. male with papillary thyroid cancer and a history of poorly controlled T2DM, hypertension, ischemic cardiac disease s/p catheterization in 2021 and metastatic thyroid cancer, s/p resection with Dr. Azucena Fallen on 04/12/20 and neck dissection on 07/28/20. Noted to have metastatic disease with pulmonary involvement on PET/CT (01/14/2022)     Oral chemotherapy: Larotrectinib (Vitrakvi) 100 mg capsule BID  Start date: ***  Specialty pharmacy: [facility]  Copay: $4    Interim History: ***  Neurotox, Labs, LFTs/CBC, fatigue, N/V/D/C    Adherence: *** how taking, any missed doses  Drug-Drug Interactions: ***    Oncology History:  Hematology/Oncology History   Thyroid cancer (CMS-HCC)   03/09/2020 Initial Diagnosis    Thyroid cancer (CMS-HCC)     07/29/2021 -  Cancer Staged    Staging form: Thyroid - Differentiated, AJCC 8th Edition  - Clinical: Stage II (cM1, Age at diagnosis: < 55 years) - Signed by Dionicia Abler, DO on 07/29/2021           Problem List:   Patient Active Problem List   Diagnosis    Abnormal computed tomography angiography (CTA)    Thyroid cancer (CMS-HCC)    Obesity    Right inguinal hernia    Diabetes (CMS-HCC)    Essential hypertension    Hidradenitis suppurativa    Postoperative hypothyroidism       Medications:  Current Outpatient Medications   Medication Sig Dispense Refill    alcohol swabs (ALCOHOL PREP PADS) PadM Use three times a day as needed. 100 each 0    amlodipine (NORVASC) 10 MG tablet Take 1 tablet (10 mg total) by mouth daily. 90 tablet 3    atorvastatin (LIPITOR) 80 MG tablet Take 1 tablet (80 mg total) by mouth nightly. 90 tablet 2    BD LUER-LOK SYRINGE 3 mL 23 gauge x 1 1/2 Syrg       bimekizumab-bkzx 160 mg/mL AtIn Inject the contents of 2 pens (320mg ) every 2 weeks for the first 16 weeks followed by every 4 weeks. 4 mL 4    bimekizumab-bkzx 160 mg/mL AtIn Inject the contents of 2 pens (320mg ) subcutaneous every 4 weeks 2 mL 11    blood sugar diagnostic (GLUCOSE BLOOD) Strp Test blood glucose once daily. 50 strip 11    blood-glucose meter kit Disp. blood glucose meter kit preferred by patient's insurance. Check blood sugars as directed by provider. Dx: Diabetes, E11.9 1 each 11    carvedilol (COREG) 6.25 MG tablet Take 1 tablet (6.25 mg total) by mouth two (2) times a day.      cetirizine (ZYRTEC) 10 MG tablet Take 1 tablet (10 mg total) by mouth daily. 30 tablet 2    clindamycin (CLEOCIN T) 1 % lotion Apply topically two (2) times a day. 60 mL 11    clindamycin (CLEOCIN) 300 MG capsule Take  1 capsule (300 mg total) by mouth two (2) times a day. 60 capsule 0    clindamycin (CLEOCIN) 300 MG capsule Take 1 capsule (300 mg total) by mouth every twelve (12) hours. 180 capsule 0    DEXCOM G7 RECEIVER Misc Use as directed. 1 each 0    DEXCOM G7 SENSOR Devi Apply Dexcom G7 sensor every 10 days. 9 each 3    doxycycline (VIBRA-TABS) 100 MG tablet Take 1 tablet (100 mg total) by mouth two (2) times a day. Take with food. 168 tablet 0    empty container Misc Use as directed to dispose of Humira pens. 1 each 2    gabapentin (NEURONTIN) 300 MG capsule Take 1 capsule (300 mg total) by mouth two (2) times a day. 180 capsule 2    hydrOXYzine (ATARAX) 25 MG tablet Take 1 tablet (25 mg total) by mouth every eight (8) hours as needed for itching. 30 tablet 0 ibuprofen (MOTRIN) 600 MG tablet Take 1 tablet (600 mg total) by mouth two (2) times a day. 60 tablet 1    insulin glargine (BASAGLAR, LANTUS) 100 unit/mL (3 mL) injection pen Inject 0.3 mL (30 Units total) under the skin nightly. 15 mL 2    insulin syringe-needle U-100 0.3 mL 31 gauge x 5/16 (8 mm) Syrg Use as directed 90 each 12    lancets Misc Test blood glucose once daily. 100 each 11    larotrectinib (VITRAKVI) 100 mg capsule Take 1 capsule (100 mg total) by mouth two (2) times a day . 60 capsule 5    levothyroxine (SYNTHROID) 300 MCG tablet Take 1 tablet (300 mcg total) by mouth daily. 90 tablet 3    losartan-hydroCHLOROthiazide (HYZAAR) 100-25 mg per tablet Take 1 tablet by mouth daily.      metFORMIN (GLUCOPHAGE-XR) 500 MG 24 hr tablet Take 3 tablets (1,500 mg total) by mouth daily with evening meal. 270 tablet 2    naloxone (NARCAN) 0.4 mg/mL injection Infuse 1 mL (0.4 mg total) into a venous catheter.      nicotine (NICODERM CQ) 21 mg/24 hr patch Place 1 patch on the skin daily. Remove old patch before applying new one. 28 patch 2    nicotine polacrilex (NICORETTE) 4 MG gum Chew 1 piece (4 mg total) and park in cheek every hour as needed for smoking cessation. Max is up to 24 pieces/day. 110 each 2    ondansetron (ZOFRAN-ODT) 4 MG disintegrating tablet       pen needle, diabetic (PEN NEEDLE) 31 gauge x 5/16 (8 mm) Ndle Injection Frequency is 1 time per day 100 each 11    pen needle, diabetic (TRUEPLUS PEN NEEDLE) 32 gauge x 5/32 (4 mm) Ndle Use with insulin glargine once daily 100 each 1    spironolactone (ALDACTONE) 25 MG tablet Take 1 tablet (25 mg total) by mouth daily.      triamcinolone (KENALOG) 0.1 % ointment Apply topically two (2) times a day. Apply twice a day to affected areas of the skin until the skin feels smooth, then stop. 80 g 0     No current facility-administered medications for this visit.       Allergies:  Allergies   Allergen Reactions    Penicillin Anaphylaxis    Fish Derived Lisinopril     Peanut Butter Flavor      Per allergy testing    Penicillins Other (See Comments)     Per patient, when he took at age 69 his legs  and arms shook. Not sure if it was seizure or not.     Shellfish Containing Products     Soy        Personal and Social History:   Social History     Tobacco Use    Smoking status: Every Day     Types: Cigarettes     Passive exposure: Current    Smokeless tobacco: Never   Substance Use Topics    Alcohol use: Yes   He reports that he does not currently use drugs after having used the following drugs: Marijuana.    Family History:  His family history is not on file.    Review of Systems: A complete review of systems was obtained including: Constitutional, Eyes, ENT, Cardiovascular, Respiratory, GI, GU, Musculoskeletal, Skin, Neurological, Psychiatric, Endocrine, Heme/Lymphatic, and Allergic/Immunologic systems. All other systems reviewed and are negative to the patient???s management except for what was mentioned in the interim history.     Vital Signs: There were no vitals taken for this visit.    Laboratory Data:  No visits with results within 1 Day(s) from this visit.   Latest known visit with results is:   Office Visit on 03/14/2023   Component Date Value    HGB A1C, POC 03/14/2023 9.1 (H)     EST AVERAGE GLUCOSE, POC 03/14/2023 214         I spent *** minutes with Mr.Balingit in direct patient care.    Bobby Rumpf, PharmD, BCOP, CPP  Clinical Pharmacist Practitioner, [facility]

## 2023-04-04 ENCOUNTER — Ambulatory Visit: Admit: 2023-04-04 | Payer: PRIVATE HEALTH INSURANCE

## 2023-04-04 ENCOUNTER — Ambulatory Visit: Admit: 2023-04-04 | Payer: PRIVATE HEALTH INSURANCE | Attending: Oncology | Primary: Oncology

## 2023-04-04 ENCOUNTER — Ambulatory Visit
Admit: 2023-04-04 | Payer: PRIVATE HEALTH INSURANCE | Attending: Student in an Organized Health Care Education/Training Program | Primary: Student in an Organized Health Care Education/Training Program

## 2023-04-04 NOTE — Unmapped (Unsigned)
 {** REMINDER - THIS NOTE IS NOT A FINAL MEDICAL RECORD UNTIL IT IS SIGNED.  UNTIL THEN, THE CONTENT BELOW MAY REFLECT INFORMATION FROM A DOCUMENTATION TEMPLATE, NOT THE ACTUAL PATIENT VISIT. **}       [facility]  PCP: Paul Emerald, DO    Consulting providers:  Otolaryngology: Dr. 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 man with history of poorly controlled T2DM, hypertension, ischemic cardiac disease s/p catheterization in 2021 and metastatic thyroid cancer, s/p resection with Dr. Azucena Hines on 04/12/20 and neck dissection on 07/28/20. Noted to have metastatic disease with pulmonary involvement on PET/CT (01/14/2022). Treating with 1L Lenvima 18 mg daily (02/21/21-current). Course has been complicated by poor compliance and HS flares. 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 He presents today for a follow-up tox check on Larotrectinib.     # Metastatic Papillary Thyroid Cancer, BRAF WT  - Treatment goals are palliative.   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 Janyth H[facility], on 02/20/23    # Hypertension, better control  - Continue current amlodipine 10 mg daily and Lorsartan 100 mg  - Encouraged medical compliance and frequent home checks  - Managed by his PCP    # Diabetes, poorly controlled  - A1c 14.6% (11/17/21) --> 9.8% (02/01/22) --> 11.4 (06/20/22) --> 13.6 (12/21/22)  - Continue metformin XR 500 mg daily given adherence challenges with IR BID dosing in past  - Continue Lantus 18 units daily  - Discussed importance of compliance     # Hypothyroidism,  - Poor control due to lack of medication compliance  - Goal TSH <0.1  - TSH 1.183, FT4 1.81 today (02/14/2023)  - Continue synthroid 300 mcg once daily.  Refilled today    # Supportive care  - Cancer-related pain: Continue on Gabapentin 300mg  BID, ibuprofen PRN for wound site pain  - Psychosocial: denies any need for CCSP at this time.   - Constipation: Recommended Miralax     Follow up: 3 months    Paul [facility]   Head and Neck Medical Oncology  [place] [facility] [place]    --------------------------  Interval History  - At his last visit with me in January, we discussed transitioning from [place], which has not been well-tolerated, to [employer] (Larotrectinib) and started working on Therapist, occupational  - He received the drug and started taking Larotrectinib on ***  - Last seen by dermatology, Dr. 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 Started on clindamycin + rifampin for streptococcus wound infection and HS. Now established with [facility] and plans to see Dr. Janyth Hines next week for unroofing procedure consultation   - Paul Hines has been on hold since his admission on 12/20/22, to allow his wounds to heal  - In the interim, Paul Hines ordered [facility] genetic testing to identify potential targetable mutation given challenges with VEGF and HS   - He returns today for a discussion of interval genetic testing and today's scans   - Patient reports he has been feeling okay, with slowly improving discomfort d/t HS  - Still having wounds packed  - Denies fevers, chills, CP, SOB, cough, NVD, HFS  - Presents alone    History of Present Illness:  Paul Hines is a 53 y.o. male with history of T2DM, hypertension, ischemic heart disease s/p cardiac catheterization (07/2019) who presents for evaluation of head and neck cancer. I have reviewed his records including history, imaging, pathology reports, and, when applicable, operative notes and summarized his oncologic history below:     Patient was incidentally diagnosed on workup for chest pain in 2021. CTA cardiac (07/08/2019) showed bilateral pulmonary nodules and repeat imaging from 10/2019 and 12/2019 redemonstrated nodules. PET/CT obtained (10/2019) and was notable for hypermetabolic right hilar and mediastinal lymph nodes. FNA performed on 01/09/2020 to dominant right upper pulmonary nodule and confirmed papillary thyroid carcinoma. Further biopsy of lymph nodes were negative for malignancy.      He was evaluated by Dr. Azucena Hines in February 2022. At that time he reports intermittent dysphagia, but was not significantly bothersome and did not limit his diet. He otherwise denied palpable neck mass, voice changes, otalgia or weight loss. Pre-treatment Tg was 75 in January 2022. Patient underwent total thyroidectomy on 04/12/2020 with bilateral neck dissection on 07/28/2020. Final pathology consistent with PTC, lymph node involvement (14/14) with positive ENE. Received adjuvant RAI (200 mCi I-131) on 05/20/2020.      PET/CT (01/14/2021) concerning for numerous pulmonary nodules that had increased in size when compared to prior imaging. Otherwise, no evidence of distant disease. Tumor marker was also noted to be elevated at 212 (01/04/2021).      02/01/2021: Present unaccompanied today, feeling well until recent illness as described above. He does report residual numbness all along the underside of his face. Previously smoked 2 packs of cigarettes per day for 14 years. Currently smoking ~3 cigarettes daily. He is not currently employed, but would like to work if possible. He was recently been released from prison and is currently staying with [facility].     Past Medical History:   Diagnosis Date    Abnormal findings on dx imaging of heart and cor circ     Acne vulgaris     Allergic contact dermatitis due to food in contact with skin     Atherosclerosis     Athscl heart disease of native coronary artery w/o ang pctrs     Chronic ischemic heart disease     Cutaneous abscess     Diabetic polyneuropathy (CMS-HCC)  Disorder of skin and subcutaneous tissue     Essential hypertension     H/O medication noncompliance     Hernia, inguinal, unilateral     Hypertrophic cardiomyopathy (CMS-HCC)     Malignant neoplasm of thyroid gland (CMS-HCC)     Presbyopia     Pseudofolliculitis barbae     Shoulder pain, bilateral     Tinea unguium     Type 2 diabetes mellitus (CMS-HCC)     Xerosis cutis        Past Surgical History:   Procedure Laterality Date    PR BRNCHSC EBUS GUIDED SAMPL 1/2 NODE STATION/STRUX N/A 01/09/2020    Procedure: BRONCH, RIGID OR FLEXIBLE, INC FLUORO GUIDANCE, WHEN PERFORMED; WITH EBUS GUIDED TRANSTRACHEAL AND/OR TRANSBRONCHIAL SAMPLING, ONE OR TWO MEDIASTINAL AND/OR HILAR LYMPH NODE STATIONS OR STRUCTURES;  Surgeon: Paul Charles, MD;  Location: MAIN OR [place];  Service: Pulmonary    PR BRNSCHSC TNDSC EBUS DX/TX INTERVENTION PERPH LES Right 01/09/2020    Procedure: BRONCH, RIGID OR FLEXIBLE, INCLUDING FLUORO GUIDANCE, WHEN PERFORMED; WITH TRANSENDOSCOPIC EBUS DURING BRONCHOSCOPIC DIAGNOSTIC OR THERAPEUTIC INTERVENTION(S) FOR PERIPHERAL LESION(S);  Surgeon: Paul Charles, MD;  Location: MAIN OR [place];  Service: Pulmonary    PR BRONCHOSCOPY,COMPUTER ASSIST/IMAGE-GUIDED NAVIGATION Right 01/09/2020    Procedure: ROBOT ION BRONCHOSCOPY,RIGID OR FLEXIBLE,INCLUDE FLUORO WHEN PERFORMED; W/COMPUTER-ASSIST,IMAGE-GUIDED NAVIGATION;  Surgeon: Paul Charles, MD;  Location: MAIN OR [place];  Service: Pulmonary    PR BRONCHOSCOPY,DIAGNOSTIC W LAVAGE Right 01/09/2020    Procedure: BRONCHOSCOPY, RIGID OR FLEXIBLE, INCLUDE FLUOROSCOPIC GUIDANCE WHEN PERFORMED; W/BRONCHIAL ALVEOLAR LAVAGE;  Surgeon: Paul Charles, MD;  Location: MAIN OR [place];  Service: Pulmonary    PR BRONCHOSCOPY,TRANSBRON ASPIR BX Right 01/09/2020    Procedure: BRONCHOSCOPY, RIGID/FLEX, INCL FLUORO; W/TRANSBRONCH NDL ASPIRAT BX, TRACHEA, MAIN STEM &/OR LOBAR BRONCHUS;  Surgeon: Paul Charles, MD;  Location: MAIN OR [place];  Service: Pulmonary    PR BRONCHOSCOPY,TRANSBRONCH BIOPSY Right 01/09/2020    Procedure: BRONCHOSCOPY, RIGID/FLEXIBLE, INCLUDE FLUORO GUIDANCE WHEN PERFORMED; W/TRANSBRONCHIAL LUNG BX, SINGLE LOBE;  Surgeon: Paul Charles, MD;  Location: MAIN OR ;  Service: Pulmonary    PR CATH PLACE/CORON ANGIO, IMG SUPER/INTERP,W LEFT HEART VENTRICULOGRAPHY N/A 08/26/2019    Procedure: Left Heart Catheterization;  Surgeon: Marlaine Hind, MD;  Location: [facility];  Service: Cardiology    PR LIGATN THOR DUCT,CERV APPROACH Bilateral 07/28/2020    Procedure: SUTURE &/OR LIG THORACIC DUCT; CERV APPROACH;  Surgeon: Lauralee Evener, MD;  Location: MA[facility] OR [facility];  Service: ENT    PR REMOVAL NODES, NECK,CERV MOD RAD Bilateral 07/28/2020    Procedure: CERVICAL LYMPHADENECTOMY (MODIFIED RADICAL NECK DISSECTION);  Surgeon: Lauralee Evener, MD;  Location: [facility]IN OR [facility];  Service: ENT    PR THYROIDECTOMY,MALIG,LTD NECK SURG Bilateral 04/12/2020    Procedure: THYROIDECTOMY, TOTAL OR SUBTOTAL FOR MALIGNANCY; WITH LIMITED NECK DISSECTION;  Surgeon: Lauralee Evener, MD;  Location: MAIN OR [facility];  Service: ENT       Current Outpatient Medications   Medication Sig Dispense Refill    alcohol swabs (ALCOHOL PREP PADS) PadM Use three times a day as needed. 100 each 0    amlodipine (NORVASC) 10 MG tablet Take 1 tablet (10 mg total) by mouth daily. 90 tablet 3    atorvastatin (LIPITOR) 80 MG tablet Take 1 tablet (80 mg total) by mouth nightly. 90 tablet 2    BD LUER-LOK SYRINGE 3 mL 23 gauge x 1 1/2 Syrg       bimekizumab-bkzx 160 mg/mL  AtIn Inject the contents of 2 pens (320mg ) every 2 weeks for the first 16 weeks followed by every 4 weeks. 4 mL 4    bimekizumab-bkzx 160 mg/mL AtIn Inject the contents of 2 pens (320mg ) subcutaneous every 4 weeks 2 mL 11    blood sugar diagnostic (GLUCOSE BLOOD) Strp Test blood glucose once daily. 50 strip 11    blood-glucose meter kit Disp. blood glucose meter kit preferred by patient's insurance. Check blood sugars as directed by provider. Dx: Diabetes, E11.9 1 each 11    carvedilol (COREG) 6.25 MG tablet Take 1 tablet (6.25 mg total) by mouth two (2) times a day.      cetirizine (ZYRTEC) 10 MG tablet Take 1 tablet (10 mg total) by mouth daily. 30 tablet 2    clindamycin (CLEOCIN T) 1 % lotion Apply topically two (2) times a day. 60 mL 11    clindamycin (CLEOCIN) 300 MG capsule Take 1 capsule (300 mg total) by mouth two (2) times a day. 60 capsule 0    clindamycin (CLEOCIN) 300 MG capsule Take 1 capsule (300 mg total) by mouth every twelve (12) hours. 180 capsule 0    DEXCOM G7 RECEIVER Misc Use as directed. 1 each 0    DEXCOM G7 SENSOR Devi Apply Dexcom G7 sensor every 10 days. 9 each 3    doxycycline (VIBRA-TABS) 100 MG tablet Take 1 tablet (100 mg total) by mouth two (2) times a day. Take with food. 168 tablet 0    empty container Misc Use as directed to dispose of Humira pens. 1 each 2    gabapentin (NEURONTIN) 300 MG capsule Take 1 capsule (300 mg total) by mouth two (2) times a day. 180 capsule 2    hydrOXYzine (ATARAX) 25 MG tablet Take 1 tablet (25 mg total) by mouth every eight (8) hours as needed for itching. 30 tablet 0    ibuprofen (MOTRIN) 600 MG tablet Take 1 tablet (600 mg total) by mouth two (2) times a day. 60 tablet 1    insulin glargine (BASAGLAR, LANTUS) 100 unit/mL (3 mL) injection pen Inject 0.3 mL (30 Units total) under the skin nightly. 15 mL 2    insulin syringe-needle U-100 0.3 mL 31 gauge x 5/16 (8 mm) Syrg Use as directed 90 each 12    lancets Misc Test blood glucose once daily. 100 each 11    larotrectinib (VITRAKVI) 100 mg capsule Take 1 capsule (100 mg total) by mouth two (2) times a day . 60 capsule 5    levothyroxine (SYNTHROID) 300 MCG tablet Take 1 tablet (300 mcg total) by mouth daily. 90 tablet 3    losartan-hydroCHLOROthiazide (HYZAAR) 100-25 mg per tablet Take 1 tablet by mouth daily.      metFORMIN (GLUCOPHAGE-XR) 500 MG 24 hr tablet Take 3 tablets (1,500 mg total) by mouth daily with evening meal. 270 tablet 2    naloxone (NARCAN) 0.4 mg/mL injection Infuse 1 mL (0.4 mg total) into a venous catheter.      nicotine (NICODERM CQ) 21 mg/24 hr patch Place 1 patch on the skin daily. Remove old patch before applying new one. 28 patch 2    nicotine polacrilex (NICORETTE) 4 MG gum Chew 1 piece (4 mg total) and park in cheek every hour as needed for smoking cessation. Max is up to 24 pieces/day. 110 each 2    ondansetron (ZOFRAN-ODT) 4 MG disintegrating tablet       pen needle, diabetic (PEN NEEDLE) 31 gauge x  5/16 (8 mm) Ndle Injection Frequency is 1 time per day 100 each 11    pen needle, diabetic (TRUEPLUS PEN NEEDLE) 32 gauge x 5/32 (4 mm) Ndle Use with insulin glargine once daily 100 each 1    spironolactone (ALDACTONE) 25 MG tablet Take 1 tablet (25 mg total) by mouth daily.      triamcinolone (KENALOG) 0.1 % ointment Apply topically two (2) times a day. Apply twice a day to affected areas of the skin until the skin feels smooth, then stop. 80 g 0     No current facility-administered medications for this visit.       Allergies   Allergen Reactions    Penicillin Anaphylaxis    Fish Derived     Lisinopril     Peanut Butter Flavor      Per allergy testing    Penicillins Other (See Comments)     Per patient, when he took at age 73 his legs and arms shook. Not sure if it was seizure or not.     Shellfish Containing Products     Soy        Social History     Tobacco Use    Smoking status: Every Day     Types: Cigarettes     Passive exposure: Current    Smokeless tobacco: Never   Vaping Use    Vaping status: Never Used   Substance Use Topics    Alcohol use: Yes    Drug use: Not Currently     Types: Marijuana     Comment: quit in 1992       Social History     Social History Narrative    Not on file       Family History   Problem Relation Age of Onset    Melanoma Neg Hx     Basal cell carcinoma Neg Hx     Squamous cell carcinoma Neg Hx        Review of Systems: A 12-system review of systems was obtained including: Constitutional, Eyes, ENT, Cardiovascular, Respiratory, GI, GU, Musculoskeletal, Skin, Neurological, Psychiatric, Endocrine, Heme/Lymphatic, and Allergic/Immunologic systems. It is negative or non-contributory to the patient???s management except for as stated in patient's HPI    ECOG PS: 1    Physical Examination:  Vital Signs: There were no vitals taken for this visit.  CONSTITUTIONAL: Pleasant man, mild discomfort due to HS  Oral Cavity: MMM, no oral lesions or mucositis  Lymphatics: No lymphadenopathy   CV: RRR; no lower extremity edema  RESP: normal work of breathing  GI: Soft, non-tender, non-distended  SKIN: No skin rashes; groin not examined however tender per patient  NEURO: no focal deficits appreciated,  PSYCH: Normal mood and appropriate affect    LABS  Lab Results   Component Value Date    WBC 7.3 02/14/2023    HGB 12.9 02/14/2023    HCT 39.7 02/14/2023    PLT 302 02/14/2023       Lab Results   Component Value Date    NA 137 02/14/2023    K 3.6 02/14/2023    CL 96 (L) 02/14/2023    CO2 28.0 02/14/2023    BUN 15 02/14/2023    CREATININE 1.02 02/14/2023    GLU 173 02/14/2023    CALCIUM 10.3 02/14/2023    MG 1.6 07/31/2020    PHOS 3.0 07/31/2020       Lab Results   Component Value Date    BILITOT 0.4 02/14/2023  PROT 8.5 (H) 02/14/2023    ALBUMIN 3.8 02/14/2023    ALT 23 02/14/2023    AST 23 02/14/2023    ALKPHOS 118 (H) 02/14/2023       No results found for: PT, INR, APTT      IMAGING  CT Neck (02/14/2023)  No definitive evidence of recurrent disease in the neck. Previously described hypodensities in the lower neck between the esophagus and trachea are less conspicuous. Recommend attention on follow-up examination.     CT Chest (02/14/2023)  Stable miliary pulmonary metastasis.     PATHOLOGY  THYROID GLAND  8th Edition - Protocol posted: 03/27/2018   THYROID GLAND: RESECTION - All Specimens  Clinical History  No known radiation exposure    SPECIMEN   Procedure  Total thyroidectomy    TUMOR   Tumor Focality  Multifocal    Tumor Characteristics     Tumor Site  Right lobe      Left lobe      Isthmus    Histologic Type  Papillary carcinoma, classic (usual, conventional)    Tumor Size  Greatest Dimension (Centimeters): 3.1 cm   Extrathyroidal Extension  Not identified    Angioinvasion (vascular invasion)  Not identified    Lymphatic Invasion  Not identified    Perineural Invasion  Not identified    Margins  Uninvolved by carcinoma    Distance of Invasive Carcinoma from Closest Margin (Millimeters)  1 mm   Mitotic Rate  4 Mitoses per 2 mm^2   LYMPH NODES   Number of Lymph Nodes Involved  14    Nodal Levels Involved  Level VI    Size of Largest Metastatic Deposit (Centimeters)  5 cm   Extranodal Extension (ENE)  Present    Number of Lymph Nodes Examined  14    Nodal Levels Examined  Level VI    PATHOLOGIC STAGE CLASSIFICATION (pTNM, AJCC 8th Edition)   TNM Descriptors  m (multiple primary tumors)         Primary Tumor (pT)  pT2    Regional Lymph Nodes (pN)  pN1a    Distant Metastasis (pM)  pM1    Site(s)  lung see [id number]      [facility] xT       I have personally reviewed relevant imaging, laboratory values, existing medical records, and pathology. I have summarized these findings in the oncology history above.     ***

## 2023-04-09 NOTE — Unmapped (Signed)
 Call attempt number 1 was made to reach [facility] in regards to rescheduling appointments for [name].    The call resulted in: Message left    Please reschedule the patient upon their return call with Dr. Rosana Hoes or Bobby Rumpf for the following:     Provider    Thank you,    Delila Pereyra

## 2023-04-10 NOTE — Unmapped (Unsigned)
 Subjective     Reason for visit:    Paul Hines is a 53 y.o. 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  Patient presents to this visit alone.    Known DM Complications: peripheral neuropathy    Date of Last Diabetes Related Visit: 02/01/23 with CPP, 03/14/23 with PCP    Action At Last Diabetes Related Visit:    Change Lantus insulin to 30 units once daily (pt reported taking 40 units half the days of the week and 20 units the other days)  Increase metformin XR from 1000 to 1500 mg daily  Continue BP med regimen: amlodipine 10 mg daily, losartan-HCTZ 100-25 mg daily, spironolactone 25 mg daily, carvedilol 6.25 mg BID  Recommended pt start using nicotine 21 mg/24h patch more consistently + continue nicotine 4 mg gum PRN cravings  Continue atorvastatin 80 mg daily  Encouraged pt to schedule eye exam and provided phone # for [facility] ophtho (referral placed previously)  Encouraged pt to strongly consider pill packs but deferred making this transition to future visit per pt preference  Gave pt pillbox at PCP visit    Since Last visit / History of Present Illness:    Patient reports fully implementing plan from last visit. ***    Questions/notes for today:  *PCP visit at 3:40pm today also?*    - Told to stop rifampin since change in cancer tx 02/20/23    - Review Dexcom CGM?  - S/sx high or low BG?  - Confirm Lantus insulin dose (pt previously has reported varying dose 15-40 units)?  - Adherence/tolerability of increased metformin XR (okay to take dose all at once)?  - Future: GLP-1 (non-medullary thyroid cancer hx) or TZD (as long as no hx CHF or bladder cancer)   - However A1c did improve significantly recently with metformin + basal insulin so may defer adding other agents for now?    - Clinic BP?  - Meds taken before visit?  - Taper off spiro if able to simplify med regimen (keep carvedilol given hx non-obstructive CAD?)?  - Longer-acting ARB or thiazide (although would like to utilize combo pills if possible)?    - Pillbox education?  - Missed doses?  - Reconsider transition to pill packs?  - Reduce # of copays?   - Amlod/atorva or amlod/valsartan/HCTZ ([employer]forge)   - Taper off spiro if able    - Clarify duration of clinda/rifampin?    - Smoking cessation?    - Still needs eye exam?  - [place] [facility] needs credit card on file to ship meds?    Reported HTN Regimen:  Amlodipine 10 mg daily  Losartan-HCTZ 100-25 mg daily  Carvedilol 6.25 mg BID  Spironolactone 25 mg daily    Home BP Monitoring:  Does not check BP at home. Denies s/sx orthostasis. Denies s/sx elevated BP, including headaches or stroke sx. Endorses some blurry vision when reading at times, though unsure if related to BP or BG.    Reported DM Regimen:    Metformin XR 1500 mg qAM  Lantus 30 units daily    DM medications tried in the past:   Glipizide  Insulin NPH    Medication Adherence and Access:  Since last visit, patient reports missing 2nd dose of insulin (note: pt thought insulin was intended to be BID since last PCP visit as  stated above) ~3 days in the past week, otherwise has gotten at least 1 dose of insulin every day. Also does think he has missed some doses of rifampin and clindamycin, though unsure how many or how often.    Does not currently use a pillbox. Has never been informed about pill packs previously but would be open to this in the future, not ready to make this switch today (since would need to change pharmacies).    Biggest reason for missing doses of meds is due to simply forgetting to take. Phone does have alarm capabilities but doesn't think this would help. Tries to have routine of taking meds right away in AM after waking.    Has not taken any meds yet this AM as he was running behind to get to clinic appointment.    Has pill bottles of both atorvastatin 40 mg + 80 mg tablets, will take only 1 of them but dose depends on which one he happens to see first that day. Also has pill bottles for carvedilol 6.25 mg tablets and spironolactone 25 mg tablets, which were previously thought to be not in current regimen as pt did not have bottles for these at past visits.    SMBG Per patient memory:    Using Dexcom G7 CGM as instructed (new as of mid-December 2024). Forgot CGM Receiver at home today so reports glucose values from memory.    For past ~2 weeks:  This AM: 150  Average AM: 130-150  Highest: 300s - CGM alarms for >250, almost every night at bedtime  Lowest: 126    Hypoglycemia:    Symptoms of hypoglycemia since last visit:  no  If yes, it was treated by: n/a    DM Prevention:  Statin: Taking; high intensity (atorvastatin 80 mg)  Aspirin - unclear if indicated; Not taking    ACEI/ARB - Taking (losartan 100 mg daily - in combo pill w/ HCTZ 25 mg); Urine MA/CR Ratio - elevated urinary albumin excretion of 33.2 ug/g (last checked 10/19/22).  Last eye exam: unknown - DUE  Last foot exam: 10/19/22   Tobacco Use: Current smoker  Immunizations:   Immunization History   Administered Date(s) Administered    COVID-19 VAC,BIVALENT,MODERNA(BLUE CAP) 11/09/2020    COVID-19 VACCINE,MRNA(MODERNA)(PF) 03/06/2019, 04/03/2019, 12/18/2019, 06/08/2020    Covid-19 Vac, (47yr+) (Comirnaty) Mrna Pfizer  10/19/2022    DTaP, Unspecified Formulation 12/13/1971, 02/09/1972, 04/26/1972, 10/03/1973    INFLUENZA VACCINE IIV3(IM)(PF)6 MOS UP 10/19/2022    Influenza Vaccine Quad(IM)6 MO-Adult(PF) 12/02/2020    Influenza Virus Vaccine, unspecified formulation 11/03/2020    Measles 12/13/1971    Mumps 03/28/1974    Pneumococcal Conjugate 20-valent 11/03/2022    Polio Virus Vaccine, Unspecified Formulation 12/13/1971, 02/09/1972, 04/26/1972, 10/03/1973    Rubella 12/13/1971    SHINGRIX-ZOSTER VACCINE (HZV),RECOMBINANT,ADJUVANTED(IM) 11/03/2022, 03/14/2023    TdaP 11/03/2022 _________________________________________________    Past Medical History: reviewed PMH in epic today    Social History:  Social History     Tobacco Use   Smoking Status Every Day    Types: Cigarettes    Passive exposure: Current   Smokeless Tobacco Never       Medications: Medications reviewed in EPIC medication station and updated today by the clinical Insurance underwriter.         Objective   Vitals:    There were no vitals filed for this visit. ***     Wt Readings from Last 3 Encounters:   03/14/23 80 kg (176 lb 6.4 oz)  02/20/23 81.8 kg (180 lb 5 oz)   02/14/23 78.5 kg (173 lb)       There is no height or weight on file to calculate BMI.    The 10-year ASCVD risk score (Arnett DK, et al., 2019) is: 48.9%    Values used to calculate the score:      Age: 60 years      Sex: Male      Is Non-Hispanic African American: Yes      Diabetic: Yes      Tobacco smoker: Yes      Systolic Blood Pressure: 174 mmHg      Is BP treated: Yes      HDL Cholesterol: 71 mg/dL      Total Cholesterol: 261 mg/dL    Note: For patients with SBP <90 or >200, Total Cholesterol <130 or >320, HDL <20 or >100 which are outside of the allowable range, the calculator will use these upper or lower values to calculate the patient???s risk score.      Labs:   Lab Results   Component Value Date    A1C 9.1 (H) 03/14/2023    A1C >14.0 (H) 10/19/2022    A1C 11.4 (H) 06/20/2022     Per CareEverywhere: 13.6% (12/21/22)      Assessment/Plan:    1. Diabetes, type 2: uncontrolled per last A1c 9.1% (03/14/23), which is improved from 13.6% in Nov 2024 (per CareEverywhere). Goal <7% per ADA guidelines. *** Unable to assess CGM data since pt forget CGM Receiver at home, but per pt report, appears still persistent hyperglycemia with fasting glucose values typically above goal 80-130 and nearly daily having glucose readings up into the 300s later in the day, which is well above top of goal range <180. Somewhat difficult to know how to adjust insulin since pt misunderstood instructions and has been taking total daily dose of 40 units about half the days in the past week and 20 units the other half of the days, therefore, will attempt to meet somewhere in the middle for now to see how glucose readings steady out and reviewed he really does only need to give basal insulin once daily given 24-hour duration of action. To continued to work towards improved BG control, will also titrate metformin further and encouraged pt to stick w/ XR tabs so he can take the full dose at the same time.  Change Lantus insulin to 30 units once daily   Increase metformin XR from 1000 to 1500 mg daily  Repeat A1c due 03/23/23  Reviewed symptoms and treatment of hypoglycemia, including need to check BG prior to and following treatment.  SMBG instructions:  use Dexcom G7 CGM (including receiver) as instructed (change sensors every 10 days)  Future considerations:  Caution w/ SGLT2i given recent SSTI in inguinal region and uncontrolled HS affecting inguinal region  Could still consider GLP-1 RA given non-medullary thyroid carcinomas not a contraindication and have unclear association - also potential to improve CKD/CV-related outcomes given microalbuminuria present (if SGLT2i not pursued)  If challenges with injections, then possibly TZD (e.g. pioglitazone) - reasonable as long as no CHF or bladder cancer history    2. Hypertension: uncontrolled based on today's clinic BP of 157/97 (HR 86), although pt has not yet taken any meds (including BP meds) today. Goal <130/80 mmHg per ADA guidelines. Difficult to assess BP control on current med regimen since pt did not take meds before visit today. Therefore, will defer any med changes today but  did update med list with carvedilol and spironolactone that were previously thought to have been self-discontinued. Discussed several strategies for helping to improve med adherence as well, which I suspect is playing a large role in poor BP and BG control.  Continue amlodipine 10 mg daily  Continue losartan-HCTZ 100-25 mg daily  Continue spironolactone 25 mg daily  Continue carvedilol 6.25 mg BID  Future considerations:  Longer-acting ARB (e.g. irbesartan, telmisartan, olmesartan) and/or thiazide (e.g. chlorthalidone or indapamide) if concern for wearing off effect    3. Non-obstructive CAD - ASCVD risk reduction: last LDL elevated at 145 (10/19/22). Goal LDL <70 given DM + ASCVD risk >20% (as well as notable history of non-obstructive CAD). Appropriately on high intensity statin, though unclear adherence. May need additional non-statin LDL lowering therapies in the future but agreed to defer for now with focus on consistent adherence to current statin regimen. Briefly discussed smoking cessation as well and encouraged pt to trial nicotine patch in addition to PRN use of nicotine gum to help reduce cigarette cravings throughout the day and work towards complete cessation.  Continue atorvastatin 80 mg daily  Recommended pt start using nicotine 21 mg/24h patch more consistently + continue nicotine 4 mg gum PRN cravings  Future considerations:  Add PO ezetimibe if LDL remains above goal despite confirmed adherence w/ max dose statin    4. Medication management and care coordination:  Discarded pill bottles for metformin IR and atorvastatin 40 mg tablets and recommend pt use metformin XR and atorvastatin 80 mg tablets exclusively.  Sent message to [facility] with request to transfer Rx's for clindamycin + rifampin from [facility] to streamline which pharmacy pt will get refills from going forward.  Encouraged pt to schedule eye exam, appears ophtho referral previously placed and pt had appt but did not show so explained he should be able to call and reschedule. Provided pt with phone # for [facility] ophtho.  Given continued concerns with med adherence and management, would strongly consider pursuing pill packs. Confirmed with [facility] they would be able to provide pill packs and deliver to pt at no additional charge. Per pt preference, deferred making this transition now but would continue to revisit with pt at future follow-up visits.  Created and printed out med action plan for patient that includes names of all meds, dosing, and indications as listed below:  Name of Medication Morning Evening What It's For   Amlodipine  10 mg tablet 1 tablet  High blood pressure   Atorvastatin  80 mg tablet 1 tablet  High cholesterol  Prevent future heart attack and stroke   Carvedilol  6.25 mg tablet 1 tablet 1 tablet High blood pressure   Cetirizine  10 mg tablet 1 tablet  Allergies and itching   Clindamycin  300 mg capsule 1 capsule 1 capsule Skin infection   Rifampin  300 mg capsule 1 capsule 1 capsule Skin infection   Levothyroxine  300 mcg tablet 1 tablet  Thyroid supplement   Losartan-HCTZ  100-25 mg tablet 1 tablet  High blood pressure   Lantus insulin Inject 30 units once daily. Diabetes   Metformin XR  500 mg tablet 3 tablets  Diabetes   Spironolactone  25 mg tablet 1 tablet  High blood pressure   Bimzelx injection Inject 2 pens (320 mg) every 2 weeks for 16 weeks. Hidradenitis suppurativa   As Needed Medications   Hydroxyzine  25 mg tablet Take 1 tablet (25 mg) by mouth  every 8 hours as needed. Itching   Ibuprofen  600 mg tablet Take 1 tablet (600 mg) by mouth twice daily as needed. Pain   Gabapentin  300 mg capsule Take 1 capsule (300 mg) by mouth twice daily. Pain   Nicotine  21 mg/24 hour patch Place 1 patch on the skin daily. Decrease cigarette cravings   Nicotine  4 mg gum Chew and park 1 piece of gum in cheek every 1 hour as needed. Cigarette cravings       Follow-up: end of January with PCP as scheduled and ~2-3 weeks after that with CPP    Future Appointments   Date Time Provider Department Center   04/11/2023  9:30 AM Melba Coon, CPP [facility]   04/11/2023  3:40 PM Vernelle Emerald, DO [facility]   05/29/2023 1:00 PM Elsie Stain, MD [facility]       I spent a total of 40 minutes face to face with the patient delivering clinical care and providing education/counseling.  _________________________________________________  Damita Dunnings, PharmD, CPP, [facility]  Family Medicine Clinical Pharmacist

## 2023-04-10 NOTE — Unmapped (Incomplete)
 Call [facility] for medication refills:  [phone] (option #4)    Schedule eye exam at [facility] eye center:  [facility] [address] [place]  Phone number: [phone]    Diabetes medications:  Lantus insulin 30 units once daily  Metformin XR 3 tablets (1500 mg) once daily - INCREASE!    How often and when to check blood sugar:  Use Dexcom G7 continuous glucose monitor (CGM) as instructed. Change sensors every 10 days.    How to treat low blood sugar:  For blood sugar less than 70 --> Treat with 4 ounces of juice or regular soda, or with 3 to 4 glucose tablets. Re-check blood sugar in 15 minutes.    If blood sugar is still less than 70 on re-check, treat again and re-check in 15 minutes.   If blood sugar is over 70 on re-check and it is time to eat your regular meal, eat regular meal and take insulin as prescribed for that blood sugar.    When to follow-up:  *** with Tobi Bastos (pharmacist)      Damita Dunnings, PharmD, CPP, [facility]  Family Medicine Clinical Pharmacist  Clinic Phone: [phone]      Name of Medication Morning Evening What It's For   Amlodipine  10 mg tablet 1 tablet  High blood pressure   Atorvastatin  80 mg tablet 1 tablet  High cholesterol  Prevent future heart attack and stroke   Carvedilol  6.25 mg tablet 1 tablet 1 tablet High blood pressure   Cetirizine  10 mg tablet 1 tablet  Allergies and itching   Clindamycin  300 mg capsule 1 capsule 1 capsule Skin infection   Levothyroxine  300 mcg tablet 1 tablet  Thyroid supplement   Losartan-HCTZ  100-25 mg tablet 1 tablet  High blood pressure   Lantus insulin Inject 30 units once daily. Diabetes   Metformin XR  500 mg tablet 3 tablets  Diabetes   Spironolactone  25 mg tablet 1 tablet  High blood pressure   Bimzelx injection Inject 2 pens (320 mg) every 2 weeks for 16 weeks. Hidradenitis suppurativa   As Needed Medications   Hydroxyzine  25 mg tablet Take 1 tablet (25 mg) by mouth every 8 hours as needed. Itching Ibuprofen  600 mg tablet Take 1 tablet (600 mg) by mouth twice daily as needed. Pain   Gabapentin  300 mg capsule Take 1 capsule (300 mg) by mouth twice daily. Pain   Nicotine  21 mg/24 hour patch Place 1 patch on the skin daily. Decrease cigarette cravings   Nicotine  4 mg gum Chew and park 1 piece of gum in cheek every 1 hour as needed. Cigarette cravings

## 2023-04-11 ENCOUNTER — Ambulatory Visit: Admit: 2023-04-11 | Payer: PRIVATE HEALTH INSURANCE

## 2023-04-11 ENCOUNTER — Ambulatory Visit: Admit: 2023-04-11 | Payer: PRIVATE HEALTH INSURANCE | Attending: Ambulatory Care | Primary: Ambulatory Care

## 2023-04-11 NOTE — Unmapped (Signed)
 Patient with long standing HTN with elevated BP today.  Unclear if he is taking all of his medication as ordered.  Again, we had a good discussion regarding adherence to medication regimen and updated med list printed out for patient today.       He was given a pillbox today which I think will help with medication compliance.  I am still hesitant to add clonidine or hydralazine given his poor compliance and the chance of rebound hypertension.  But these are options once we get him on a consistent regimen and he still hypertensive.       Current Regimen prescribed:   --amlodipine 10mg  -  --losartan- hydrochlorothiazide 100/25  --spironolactone 25mg  daily    --Carvedilol 6.5mg  BID   - there has been confusion if he is taking this or not, today he had several pill bottles with carvedilol and says he is taking this.  At this point with his blood pressure, not unreasonable to restart.

## 2023-04-11 NOTE — Unmapped (Signed)
 Poorly controlled diabetes.  A1c done while admitted was marginally better at 13.6 compared to > 14.0 in September.   He still taking Lantus inconsistently, thinks it is around 20u daily. Last visit in Dec 2024 he was given a full print out with medications to take.       Repeat A1c  today.  Which is down to 9.1 indicating he is somewhat consistent with medications.     Due to thyroid cancer he is not a good candiate for GLP1. Will consider adding SGLT2 in the future, however, due to severe HA with several ongoing groin lesions with planned unroofing will hold off for now. Once HA undercontrol will consider adding.      Will plan to follow up in 3-4 weeks. Continue to follow with pharmD.      Current Medications:    --Metformin XR 1000mg  to 1500mg  daily -- taking 2 in the morning but none at night  --Lantus 30u daily per pharmD on 02/01/2023 -- per patient varies between 15-30u. Has not been taking consistently; new list given today  --Atorvastatin 80 mg daily -  --amlodipine 10mg    --losartan- hydrochlorothiazide 100/25     Medication changes today:   --increase lantus to 30u daily. Encouraged him to do this consistently   --New Dexcom placed today during visit.  --consider adding SGLT2 once HA is better controlled to decrease infection risk.      DMII prev med:   --Foot exam - 10/19/2022 UTD- deficits on big toes bilaterally. No major callous or ulcers. Skin intact  --Eyes- DR screening referral placed on 10/19/2022  --labs -   --A1C 9.1  Mar 14 2023;    12/21/2022 13.6.;  A1C >14 on 10/19/2022;   --Microalb/Cr (10/19/2022): 33.2; on ARB  --BP (goal <130/80)-  above goal, see below for discussion  --ASCVD risk (q51yrs if well controlled): elevated. On statin, unsure compliance

## 2023-04-11 NOTE — Unmapped (Signed)
 Managed by derm with help from surgery. Recently seen by derm 02/20/2023.  Current regimen is below.       Current plan:  - Stopped rifampin 300 mg BID 02/20/2023 due to change in cancer tx   - Referred to Dr Janyth Contes for unroofing procedure consultation (appointment 02/19/22) - infra-abdominal and inguinal crease unroofed.   - pain clinic appointment 12/30  - Currently getting home health wound care s/p hospitalization  - Continue bimekizumab injections as instructed and clindamycin 1% cream  -Continue clindamycin to 300 mg BID  - has follow up with Derm 05/21/2023

## 2023-04-11 NOTE — Unmapped (Unsigned)
 [facility]  Established Patient Clinic Note    Assessment/Plan:   [name] is a 53 y.o.male    [name] is a 53 y.o.male with complex history to include poorly controlled DMII, thyroid cancer, HA, and HTN here for follow-up.  He was able to get Dexcom  but still remains confused on medication regimen despite numerous med rec handouts given and follow up with pharmD.      We were able to find a pill box today. I demonstrated how to fill the box and use it. I think this will help with medication compliance. Instructed him to bring to pharmD apt.   Assessment & Plan  Type 2 diabetes mellitus with hyperglycemia, with long-term current use of insulin (CMS-HCC)  Poorly controlled diabetes.  A1c done while admitted was marginally better at 13.6 compared to > 14.0 in September.   He still taking Lantus inconsistently, thinks it is around 20u daily. Last visit in Dec 2024 he was given a full print out with medications to take.       Repeat A1c  today.  Which is down to 9.1 indicating he is somewhat consistent with medications.      Due to thyroid cancer he is not a good candiate for GLP1. Will consider adding SGLT2 in the future, however, due to severe HA with several ongoing groin lesions with planned unroofing will hold off for now. Once HA undercontrol will consider adding.      Will plan to follow up in 3-4 weeks. Continue to follow with pharmD.      Current Medications:    --Metformin XR 1000mg  to 1500mg  daily -- taking 2 in the morning but none at night  --Lantus 30u daily per pharmD on 02/01/2023 -- per patient varies between 15-30u. Has not been taking consistently; new list given today  --Atorvastatin 80 mg daily -  --amlodipine 10mg    --losartan- hydrochlorothiazide 100/25     Medication changes today:   --increase lantus to 30u daily. Encouraged him to do this consistently   --New Dexcom placed today during visit.  --consider adding SGLT2 once HA is better controlled to decrease infection risk.      DMII prev med:   --Foot exam - 10/19/2022 UTD- deficits on big toes bilaterally. No major callous or ulcers. Skin intact  --Eyes- DR screening referral placed on 10/19/2022  --labs -   --A1C 9.1  Mar 14 2023;    12/21/2022 13.6.;  A1C >14 on 10/19/2022;   --Microalb/Cr (10/19/2022): 33.2; on ARB  --BP (goal <130/80)-  above goal, see below for discussion  --ASCVD risk (q28yrs if well controlled): elevated. On statin, unsure compliance               Essential hypertension  Patient with long standing HTN with elevated BP today.  Unclear if he is taking all of his medication as ordered.  Again, we had a good discussion regarding adherence to medication regimen and updated med list printed out for patient today.        He was given a pillbox today which I think will help with medication compliance.  I am still hesitant to add clonidine or hydralazine given his poor compliance and the chance of rebound hypertension.  But these are options once we get him on a consistent regimen and he still hypertensive.       Current Regimen prescribed:   --amlodipine 10mg  -  --losartan- hydrochlorothiazide 100/25  --spironolactone 25mg  daily    --  Carvedilol 6.5mg  BID   - there has been confusion if he is taking this or not, today he had several pill bottles with carvedilol and says he is taking this.  At this point with his blood pressure, not unreasonable to restart.       Hidradenitis suppurativa  Managed by derm with help from surgery. Recently seen by derm 02/20/2023.  Current regimen is below.       Current plan:  - Stopped rifampin 300 mg BID 02/20/2023 due to change in cancer tx   - Referred to Dr Janyth Contes for unroofing procedure consultation (appointment 02/19/22) - infra-abdominal and inguinal crease unroofed.   - pain clinic appointment 12/30  - Currently getting home health wound care s/p hospitalization  - Continue bimekizumab injections as instructed and clindamycin 1% cream  -Continue clindamycin to 300 mg BID  - has follow up with Derm 05/21/2023            Attending: No att. providers found     Subjective   [name] is a 53 y.o. male  coming to clinic today for the following issues:    No chief complaint on file.    HPI:    Here for follow-up regarding diabetes and hyper tension.  Still unsure exact regimen and he has multiple duplicate pill bottles.  He still struggles with taking medications consistently despite given medication list several times.  He has been taking insulin 20 units multiple times a week but not daily.  Last saw pharmacist who recommend going up to 30 which I agree with.    I have reviewed the problem list, medications, and allergies and have updated/reconciled them if needed.    [name]  reports that he has been smoking cigarettes. He has been exposed to tobacco smoke. He has never used smokeless tobacco.  Health Maintenance   Topic Date Due    Retinal Eye Exam  Never done    Colon Cancer Screening  Never done    COVID-19 Vaccine (7 - Moderna risk 2024-25 season) 04/18/2023    Hemoglobin A1c  06/11/2023    Foot Exam  10/19/2023    Urine Albumin/Creatinine Ratio  10/19/2023    Serum Creatinine Monitoring  02/14/2024    Potassium Monitoring  02/14/2024    DTaP/Tdap/Td Vaccines (6 - Td or Tdap) 11/02/2032    Pneumococcal Vaccine 50+  Completed    Hepatitis C Screen  Completed    Influenza Vaccine  Completed    Zoster Vaccines  Completed       Objective     VITALS: There were no vitals taken for this visit.    Physical Exam  Constitutional:       Appearance: Normal appearance. He is normal weight.   HENT:      Head: Normocephalic and atraumatic.      Nose: Nose normal.      Mouth/Throat:      Mouth: Mucous membranes are moist.   Eyes:      Extraocular Movements: Extraocular movements intact.      Conjunctiva/sclera: Conjunctivae normal.   Cardiovascular:      Rate and Rhythm: Normal rate.   Pulmonary:      Effort: Pulmonary effort is normal.   Musculoskeletal:         General: Normal range of motion.      Cervical back: Normal range of motion.   Skin:     General: Skin is warm.   Neurological:      General:  No focal deficit present.      Mental Status: He is alert and oriented to person, place, and time. Mental status is at baseline.   Psychiatric:         Mood and Affect: Mood normal.         Behavior: Behavior normal.         Thought Content: Thought content normal.         Judgment: Judgment normal.         LABS/IMAGING  No visits with results within 3 Day(s) from this visit.   Latest known visit with results is:   Office Visit on 03/14/2023   Component Date Value    HGB A1C, POC 03/14/2023 9.1 (H)     EST AVERAGE GLUCOSE, POC 03/14/2023 214        [facility]  [place] [facility] [place] [facility]  [address] [place], [place] [address]  Telephone [phone]  Fax [phone]  [email or web address]

## 2023-04-12 NOTE — Unmapped (Signed)
 Patient Paul Hines was called in attempt number 2. Phone has cal restrictions so I couldn't reach him. I then called Vikki Ports and left her a message. I was trying to reach them regarding rescheduling their upcoming appointments with Bobby Rumpf.    The call resulted in: Message left    Please reschedule the patient upon their return call.       Thank you,    Delila Pereyra

## 2023-04-13 NOTE — Unmapped (Signed)
 Tried to call patient to follow up after starting Vitrakvi. Patients phone has call restrictions and I have been unable to reach him. I was able to talk to his alterate contact Vikki Ports and she reports she has also not been able to reach the patient.    Merry Proud, PharmD, [facility]  Outpatient Hematology/Oncology Pharmacist

## 2023-04-16 NOTE — Unmapped (Signed)
 Patient Paul Hines was called in attempt number 3 to reach them regarding rescheduling their upcoming appointments.     The call resulted in: Number not working    Please schedule the patient upon their return call.      Thank you,    Delila Pereyra

## 2023-05-02 NOTE — Unmapped (Signed)
 Patient Paul Hines was called in attempt number 4. Phone has call restrictions so I couldn't reach him. I then called Vikki Ports and left her a message. I was trying to reach them regarding rescheduling their upcoming appointments with Bobby Rumpf and Dr. Rosana Hoes     The call resulted in: Message left     Please reschedule the patient upon their return call.     Thanks,  Annice Pih

## 2023-05-29 ENCOUNTER — Ambulatory Visit: Admit: 2023-05-29 | Payer: Medicaid (Managed Care)

## 2023-05-29 NOTE — Unmapped (Unsigned)
.   Iris scissor was used to open all detectable sinuses. Beveling was performed with scissor or blade as appropriate to assure second intention healing.  Hemostasis was obtained in the usual fashion with pressure, AlCl solution, and/or cautery.  Area was dressed with petrolatum and bandage.  Wound care instructions given.  We will contact the patient with results when available.    Size of wound *** mm.  Codes: {HS unroofing codes:62890}    High risk medication use, bimekizumab  (Bimzelx )   Viral hepatitis panel WNL in 2024; Last Quant gold negative in March 2024  - Labs today: Quant gold     The patient was advised to call for an appointment should any new, changing, or symptomatic lesions develop.     RTC: No follow-ups on file. or sooner as needed   _________________________________________________________________      Chief Complaint     No chief complaint on file.      HPI     Paul Hines is a 53 y.o. male who presents as a returning patient (last seen 02/20/2023) for follow-up of HS and for unroofing procedure.     Today, patient reports:   - HS overall ***  - Current regimen of ***  - Would like to proceed with unroofing of *** today     History      Past Medical History, Family History, Social History, Medication List, Allergies, and Problem List were reviewed in the rooming section of Epic.     ROS: Other than symptoms mentioned in the HPI, no fevers, chills, or other skin complaints    Physical Examination     GENERAL: Well-appearing male in no acute distress, resting comfortably.  NEURO: Alert and oriented, answers questions appropriately  SKIN (Focal Skin Exam): Per patient request, examination of groin, buttocks, ** was performed    location Abscess Inflamed nodule Non-inflamed nodule Draining sinus Non-draining Sinus Hurley % scar   R axilla          L axilla          R inframammary          L inframammary          Intermammary          Pubic          R inguinal          R thigh          L inguinal L thigh          Scrotum/Vulva          Perianal          R buttock          L buttock          Other (list)  Infra-abdominal                      AN count (total sum of abscess and inflammatory nodule):   Pilonidal sinus? N  Facial acne scars present  Cribriform scar present  Syndromic type  Many intertriginous comedones  >10 intertriginous cysts  ***    All areas not commented on are within normal limits or unremarkable  - male chaperone present ***      (Approved Template 10/13/2019)

## 2023-06-11 NOTE — Unmapped (Signed)
 [facility] Other Call ONC Phone Room Smart Lists: MD Call/Provider-to-Provider - Hi Dr.Sheth,    Dr.Saarloos with [facility]  has contacted the Communication Center requesting to speak with you directly regarding the following:    Patient is experiencing worsening metastatic   papillary cancer, Dr.Saarloos would like to discuss resuming the patient's care.       The best number to call back is [phone]    A page has also been sent.    Thank you,  Chaneta Comer  [facility]  [phone]

## 2023-06-20 DIAGNOSIS — C73 Malignant neoplasm of thyroid gland: Principal | ICD-10-CM

## 2023-06-20 NOTE — Unmapped (Signed)
 Armin Landing left me a voice mail and instructed that I schedule patient and send and email to her including appointment details. Patient scheduled on 5/28 at 11:20 am    [facility]

## 2023-06-20 NOTE — Unmapped (Signed)
 Paul Hines from [place] was called to assist with setting up appts for patient. Call resulted to message left.    Theodor Fischer

## 2023-06-26 DIAGNOSIS — C73 Malignant neoplasm of thyroid gland: Principal | ICD-10-CM

## 2023-06-26 DIAGNOSIS — Z7189 Other specified counseling: Principal | ICD-10-CM

## 2023-06-26 NOTE — Unmapped (Signed)
 CMA received an email from insurance company about possible Complex Case Environmental health practitioner. CMA will route the request to appropriate team for outreach and possible enrollment with the CCM Program. Referral created to COMPLEX CASE MANAGEMENT.    [facility] - Care Management Assistance  [facility]  [address] [place], [place] [address]  P: [phone] F: [phone]  [email or web address]

## 2023-06-26 NOTE — Unmapped (Signed)
 Complex Case Management  SUMMARY NOTE    Attempted to contact pt today at Cell number to introduce Complex Case Management services. No answer, unable to leave message; 1st attempt    Discuss at next visit: Introduction to Complex Case Management    [facility] Management Assistant  [facility]  [address]  [place], [place] [address]  She/Her/Hers  P: [phone] F: [phone]  [email or web address]

## 2023-06-27 ENCOUNTER — Ambulatory Visit
Admit: 2023-06-27 | Discharge: 2023-06-27 | Payer: Medicaid (Managed Care) | Attending: Student in an Organized Health Care Education/Training Program | Primary: Student in an Organized Health Care Education/Training Program

## 2023-06-27 ENCOUNTER — Ambulatory Visit: Admit: 2023-06-27 | Discharge: 2023-06-27 | Payer: Medicaid (Managed Care)

## 2023-06-27 DIAGNOSIS — E119 Type 2 diabetes mellitus without complications: Principal | ICD-10-CM

## 2023-06-27 DIAGNOSIS — Z794 Long term (current) use of insulin: Principal | ICD-10-CM

## 2023-06-27 DIAGNOSIS — C73 Malignant neoplasm of thyroid gland: Principal | ICD-10-CM

## 2023-06-27 DIAGNOSIS — L732 Hidradenitis suppurativa: Principal | ICD-10-CM

## 2023-06-27 MED ORDER — TRIAMCINOLONE ACETONIDE 0.1 % TOPICAL OINTMENT
Freq: Two times a day (BID) | TOPICAL | 0 refills | 0.00000 days | Status: CP
Start: 2023-06-27 — End: 2024-06-26
  Filled 2023-07-09: qty 80, 30d supply, fill #0

## 2023-06-27 MED ORDER — LEVOTHYROXINE 300 MCG TABLET
ORAL_TABLET | Freq: Every day | ORAL | 3 refills | 90.00000 days | Status: CP
Start: 2023-06-27 — End: 2024-06-26
  Filled 2023-07-09: qty 90, 90d supply, fill #0

## 2023-06-27 MED ORDER — ATORVASTATIN 80 MG TABLET
ORAL_TABLET | Freq: Every evening | ORAL | 2 refills | 90.00000 days | Status: CP
Start: 2023-06-27 — End: ?
  Filled 2023-07-09: qty 90, 90d supply, fill #0

## 2023-06-27 MED ORDER — DEXCOM G7 RECEIVER
0 refills | 0.00000 days | Status: CP
Start: 2023-06-27 — End: ?

## 2023-06-27 MED ORDER — NICOTINE 21 MG/24 HR DAILY TRANSDERMAL PATCH
MEDICATED_PATCH | TRANSDERMAL | 2 refills | 28.00000 days | Status: CP
Start: 2023-06-27 — End: ?

## 2023-06-27 MED ORDER — GABAPENTIN 300 MG CAPSULE
ORAL_CAPSULE | Freq: Two times a day (BID) | ORAL | 2 refills | 90.00000 days | Status: CP
Start: 2023-06-27 — End: ?
  Filled 2023-07-09: qty 180, 90d supply, fill #0

## 2023-06-27 MED ORDER — IBUPROFEN 600 MG TABLET
ORAL_TABLET | Freq: Two times a day (BID) | ORAL | 1 refills | 30.00000 days | Status: CP
Start: 2023-06-27 — End: ?
  Filled 2023-07-09: qty 60, 30d supply, fill #0

## 2023-06-27 MED ORDER — HYDROXYZINE HCL 25 MG TABLET
ORAL_TABLET | Freq: Three times a day (TID) | ORAL | 0 refills | 10.00000 days | Status: CP | PRN
Start: 2023-06-27 — End: ?
  Filled 2023-07-09: qty 30, 10d supply, fill #0

## 2023-06-27 MED ORDER — LAROTRECTINIB 100 MG CAPSULE
ORAL_CAPSULE | Freq: Two times a day (BID) | ORAL | 5 refills | 30.00000 days | Status: CP
Start: 2023-06-27 — End: ?
  Filled 2023-07-09: qty 60, 30d supply, fill #0

## 2023-06-27 MED ORDER — AMLODIPINE 10 MG TABLET
ORAL_TABLET | Freq: Every day | ORAL | 3 refills | 90.00000 days | Status: CP
Start: 2023-06-27 — End: ?
  Filled 2023-07-09: qty 90, 90d supply, fill #0

## 2023-06-27 MED ORDER — NICOTINE (POLACRILEX) 4 MG GUM
BUCCAL | 2 refills | 5.00000 days | Status: CP | PRN
Start: 2023-06-27 — End: ?
  Filled 2023-07-09: qty 28, 28d supply, fill #0

## 2023-06-27 MED ORDER — DEXCOM G7 SENSOR DEVICE
3 refills | 0.00000 days | Status: CP
Start: 2023-06-27 — End: ?

## 2023-06-27 MED ORDER — ALCOHOL SWABS
0 refills | 0.00000 days | Status: CP
Start: 2023-06-27 — End: ?

## 2023-06-27 MED ORDER — CLINDAMYCIN 1 % LOTION
Freq: Two times a day (BID) | TOPICAL | 11 refills | 0.00000 days | Status: CP
Start: 2023-06-27 — End: ?
  Filled 2023-07-09: qty 60, 30d supply, fill #0

## 2023-06-27 MED ORDER — CLINDAMYCIN HCL 300 MG CAPSULE
ORAL_CAPSULE | Freq: Two times a day (BID) | ORAL | 0 refills | 30.00000 days | Status: CP
Start: 2023-06-27 — End: ?

## 2023-06-27 NOTE — Unmapped (Unsigned)
 [facility]  PCP: Jennifer Moellers, DO    Consulting providers:  Otolaryngology: Dr. 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 man with history of poorly controlled T2DM, hypertension, ischemic cardiac disease s/p catheterization in 2021 and metastatic thyroid cancer, s/p resection with Dr. Grace Laura on 04/12/20 and neck dissection on 07/28/20. Noted to have metastatic disease with pulmonary involvement on PET/CT (01/14/2022). Treated with 1L Lenvima  18 mg daily (02/21/21-12/2022), discontinued due to TRAE and poor tolerance. Given TPR-NTRK1 chromosomal rearrangement, treating with 2L Larotrectinib . Presents for follow up.     # Metastatic Papillary Thyroid Cancer, BRAF WT  - Relevant oncologic history summarized above; treatment goals are palliative.   - Tg: 75 (02/17/2020)  --> 134 (08/08/2022) --> 46 (10/04/2022) --> 39 (11/03/2022) --> 45 (12/20/2022) --> 83 (02/14/2023)  - Tempus xT: TPR-NTRK1 chromosomal rearrangement  - Larotrectinib  previously approved; only took for 2 weeks with acceptable tolerance  - Will restart  - Follow up in 1 month for tox check    # Hidradenitis suppurativa  - Poorly controlled, recently admitted at [facility]  - Now followed by [facility]  - Continue bimekizumab  injections  - Continue clindamycin  300 mg BID    # Hypertension  - Continue current amlodipine , coreg , hyzaar   - Refills provided  - Encouraged medical compliance and frequent home checks  - Managed by his PCP    # Diabetes, poorly controlled  - A1c remains poorly controlled  - Continue metformin  + Lantus    - Discussed importance of compliance   - Follow up PCP    # Hypothyroidism,  - Poor control due to lack of medication compliance  - Goal TSH <0.1  - TSH 1.183, FT4 1.81 today (02/14/2023)  - Continue synthroid  300 mcg once daily.  Refilled today    # Supportive care  - Cancer-related pain: Continue on Gabapentin  300mg  BID  - Psychosocial: denies any need for CCSP at this time.   - Constipation: Recommended Miralax      Follow up: 3 months    Cadey Bazile   Head and Neck Medical Oncology  [facility] [employer]     --------------------------  Interval History  History of Present Illness  Paul Hines is a 53 year old male with metastatic thyroid cancer who presents with shortness of breath and fatigue.    He has been experiencing persistent shortness of breath and fatigue, significantly impacting his ability to walk and perform daily activities. He was recently hospitalized at [facility] for these symptoms and notes a slight improvement since discharge, although he continues to feel very tired, often sleeping all day. His breathing has not improved and remains the same as before.    He experiences significant nausea and dizziness. He was on a new chemotherapy pill for about one to two weeks but has not taken it for approximately two months. He is unsure about the specific medications he needs but mentions taking metformin  and medication for his thyroid. He also mentions needing ibuprofen  for pain and a sleeping medication.    He has a history of diabetes, with a recent hemoglobin A1c of fifteen. During his hospitalization, his blood sugar was managed as it was very high. He is currently taking insulin  but does not regularly check his blood sugar due to lack of a glucometer and discomfort from finger pricks. The  last time he checked his blood sugar was three days ago, and it was around four hundred.    Socially, he is currently staying at a drug and alcohol  rehabilitation facility. He has been experiencing housing instability and is trying to improve his living situation. He has a history of legal troubles but states he has not been in trouble for the past seven years.    History of Present Illness:  Paul Hines is a 53 y.o. male with history of T2DM, hypertension, ischemic heart disease s/p cardiac catheterization (07/2019) who presents for evaluation of head and neck cancer. I have reviewed his records including history, imaging, pathology reports, and, when applicable, operative notes and summarized his oncologic history in my original consult note.    Past Medical History:   Diagnosis Date    Abnormal findings on dx imaging of heart and cor circ     Acne vulgaris     Allergic contact dermatitis due to food in contact with skin     Atherosclerosis     Athscl heart disease of native coronary artery w/o ang pctrs     Chronic ischemic heart disease     Cutaneous abscess     Diabetic polyneuropathy      Disorder of skin and subcutaneous tissue     Essential hypertension     H/O medication noncompliance     Hernia, inguinal, unilateral     Hypertrophic cardiomyopathy      Malignant neoplasm of thyroid gland      Presbyopia     Pseudofolliculitis barbae     Shoulder pain, bilateral     Tinea unguium     Type 2 diabetes mellitus      Xerosis cutis        Past Surgical History:   Procedure Laterality Date    PR BRNCHSC EBUS GUIDED SAMPL 1/2 NODE STATION/STRUX N/A 01/09/2020    Procedure: BRONCH, RIGID OR FLEXIBLE, INC FLUORO GUIDANCE, WHEN PERFORMED; WITH EBUS GUIDED TRANSTRACHEAL AND/OR TRANSBRONCHIAL SAMPLING, ONE OR TWO MEDIASTINAL AND/OR HILAR LYMPH NODE STATIONS OR STRUCTURES;  Surgeon: Vera Gip, MD;  Location: MAIN OR [place];  Service: Pulmonary    PR BRNSCHSC TNDSC EBUS DX/TX INTERVENTION PERPH LES Right 01/09/2020    Procedure: BRONCH, RIGID OR FLEXIBLE, INCLUDING FLUORO GUIDANCE, WHEN PERFORMED; WITH TRANSENDOSCOPIC EBUS DURING BRONCHOSCOPIC DIAGNOSTIC OR THERAPEUTIC INTERVENTION(S) FOR PERIPHERAL LESION(S);  Surgeon: Vera Gip, MD;  Location: MAIN OR [place];  Service: Pulmonary    PR BRONCHOSCOPY,COMPUTER ASSIST/IMAGE-GUIDED NAVIGATION Right 01/09/2020    Procedure: ROBOT ION BRONCHOSCOPY,RIGID OR FLEXIBLE,INCLUDE FLUORO WHEN PERFORMED; W/COMPUTER-ASSIST,IMAGE-GUIDED NAVIGATION;  Surgeon: Vera Gip, MD;  Location: MAIN OR [place];  Service: Pulmonary    PR BRONCHOSCOPY,DIAGNOSTIC W LAVAGE Right 01/09/2020    Procedure: BRONCHOSCOPY, RIGID OR FLEXIBLE, INCLUDE FLUOROSCOPIC GUIDANCE WHEN PERFORMED; W/BRONCHIAL ALVEOLAR LAVAGE;  Surgeon: Vera Gip, MD;  Location: MAIN OR [place];  Service: Pulmonary    PR BRONCHOSCOPY,TRANSBRON ASPIR BX Right 01/09/2020    Procedure: BRONCHOSCOPY, RIGID/FLEX, INCL FLUORO; W/TRANSBRONCH NDL ASPIRAT BX, TRACHEA, MAIN STEM &/OR LOBAR BRONCHUS;  Surgeon: Vera Gip, MD;  Location: MAIN OR [place];  Service: Pulmonary    PR BRONCHOSCOPY,TRANSBRONCH BIOPSY Right 01/09/2020    Procedure: BRONCHOSCOPY, RIGID/FLEXIBLE, INCLUDE FLUORO GUIDANCE WHEN PERFORMED; W/TRANSBRONCHIAL LUNG BX, SINGLE LOBE;  Surgeon: Vera Gip, MD;  Location: MAIN OR [place];  Service: Pulmonary    PR CATH PLACE/CORON ANGIO, IMG SUPER/INTERP,W LEFT HEART VENTRICULOGRAPHY N/A 08/26/2019    Procedure: Left Heart Catheterization;  Surgeon:  Glendell Landry, MD;  Location: [facility] CATH;  Service: Cardiology    PR LIGATN THOR DUCT,CERV APPROACH Bilateral 07/28/2020    Procedure: SUTURE &/OR LIG THORACIC DUCT; CERV APPROACH;  Surgeon: Burleigh Haines, MD;  Location: MAIN OR [facility];  Service: ENT    PR REMOVAL NODES, NECK,CERV MOD RAD Bilateral 07/28/2020    Procedure: CERVICAL LYMPHADENECTOMY (MODIFIED RADICAL NECK DISSECTION);  Surgeon: Altoona Haines, MD;  Location: MAIN OR [facility];  Service: ENT    PR THYROIDECTOMY,MALIG,LTD NECK SURG Bilateral 04/12/2020    Procedure: THYROIDECTOMY, TOTAL OR SUBTOTAL FOR MALIGNANCY; WITH LIMITED NECK DISSECTION;  Surgeon: Denhoff Haines, MD;  Location: MAIN OR [facility];  Service: ENT       Current Outpatient Medications   Medication Sig Dispense Refill    alcohol  swabs  (ALCOHOL  PREP PADS) PadM Use three times a day as needed. 100 each 0    amlodipine  (NORVASC ) 10 MG tablet Take 1 tablet (10 mg total) by mouth daily. 90 tablet 3    atorvastatin  (LIPITOR) 80 MG tablet Take 1 tablet (80 mg total) by mouth nightly. 90 tablet 2    BD LUER-LOK SYRINGE 3 mL 23 gauge x 1 1/2 Syrg       bimekizumab -bkzx 160 mg/mL AtIn Inject the contents of 2 pens (320mg ) every 2 weeks for the first 16 weeks followed by every 4 weeks. 4 mL 4    bimekizumab -bkzx 160 mg/mL AtIn Inject the contents of 2 pens (320mg ) subcutaneous every 4 weeks 2 mL 11    blood sugar diagnostic (GLUCOSE BLOOD) Strp Test blood glucose once daily. 50 strip 11    blood-glucose meter kit Disp. blood glucose meter kit preferred by patient's insurance. Check blood sugars as directed by provider. Dx: Diabetes, E11.9 1 each 11    carvedilol  (COREG ) 6.25 MG tablet Take 1 tablet (6.25 mg total) by mouth two (2) times a day.      cetirizine  (ZYRTEC ) 10 MG tablet Take 1 tablet (10 mg total) by mouth daily. 30 tablet 2    clindamycin  (CLEOCIN  T) 1 % lotion Apply topically two (2) times a day. 60 mL 11    clindamycin  (CLEOCIN ) 300 MG capsule Take 1 capsule (300 mg total) by mouth two (2) times a day. 60 capsule 0    clindamycin  (CLEOCIN ) 300 MG capsule Take 1 capsule (300 mg total) by mouth every twelve (12) hours. 180 capsule 0    DEXCOM G7 RECEIVER Misc Use as directed. 1 each 0    DEXCOM G7 SENSOR Devi Apply Dexcom G7 sensor every 10 days. 9 each 3    empty container Misc Use as directed to dispose of Humira  pens. 1 each 2    gabapentin  (NEURONTIN ) 300 MG capsule Take 1 capsule (300 mg total) by mouth two (2) times a day. 180 capsule 2    hydrOXYzine  (ATARAX ) 25 MG tablet Take 1 tablet (25 mg total) by mouth every eight (8) hours as needed for itching. 30 tablet 0    ibuprofen  (MOTRIN ) 600 MG tablet Take 1 tablet (600 mg total) by mouth two (2) times a day. 60 tablet 1    insulin  glargine (BASAGLAR , LANTUS ) 100 unit/mL (3 mL) injection pen Inject 0.3 mL (30 Units total) under the skin nightly. 15 mL 2    lancets Misc Test blood glucose once daily. 100 each 11    larotrectinib  (VITRAKVI ) 100 mg capsule Take 1 capsule (100 mg total) by mouth two (2) times a day . 60 capsule 5  levothyroxine  (SYNTHROID ) 300 MCG tablet Take 1 tablet (300 mcg total) by mouth daily. 90 tablet 3    losartan -hydroCHLOROthiazide  (HYZAAR ) 100-25 mg per tablet Take 1 tablet by mouth daily.      metFORMIN  (GLUCOPHAGE -XR) 500 MG 24 hr tablet Take 3 tablets (1,500 mg total) by mouth daily with evening meal. 270 tablet 2    naloxone (NARCAN) 0.4 mg/mL injection Infuse 1 mL (0.4 mg total) into a venous catheter.      nicotine  (NICODERM CQ ) 21 mg/24 hr patch Place 1 patch on the skin daily. Remove old patch before applying new one. 28 patch 2    nicotine  polacrilex (NICORETTE) 4 MG gum Chew 1 piece (4 mg total) and park in cheek every hour as needed for smoking cessation. Max is up to 24 pieces/day. 110 each 2    ondansetron (ZOFRAN-ODT) 4 MG disintegrating tablet       pen needle, diabetic (PEN NEEDLE) 31 gauge x 5/16 (8 mm) Ndle Injection Frequency is 1 time per day 100 each 11    pen needle, diabetic (TRUEPLUS PEN NEEDLE) 32 gauge x 5/32 (4 mm) Ndle Use with insulin  glargine once daily 100 each 1    spironolactone  (ALDACTONE ) 25 MG tablet Take 1 tablet (25 mg total) by mouth daily.      triamcinolone  (KENALOG ) 0.1 % ointment Apply topically two (2) times a day. Apply twice a day to affected areas of the skin until the skin feels smooth, then stop. 80 g 0     No current facility-administered medications for this visit.       Allergies   Allergen Reactions    Penicillin Anaphylaxis    Fish Derived     Lisinopril     Peanut Butter Flavor      Per allergy testing    Penicillins Other (See Comments)     Per patient, when he took at age 70 his legs and arms shook. Not sure if it was seizure or not.     Shellfish Containing Products     Soy        Social History     Tobacco Use    Smoking status: Every Day     Types: Cigarettes     Passive exposure: Current    Smokeless tobacco: Never   Vaping Use    Vaping status: Never Used   Substance Use Topics    Alcohol  use: Yes    Drug use: Not Currently     Types: Marijuana     Comment: quit in 1992       Social History     Social History Narrative    Not on file       Family History   Problem Relation Age of Onset    Melanoma Neg Hx     Basal cell carcinoma Neg Hx     Squamous cell carcinoma Neg Hx        Review of Systems: A 12-system review of systems was obtained including: Constitutional, Eyes, ENT, Cardiovascular, Respiratory, GI, GU, Musculoskeletal, Skin, Neurological, Psychiatric, Endocrine, Heme/Lymphatic, and Allergic/Immunologic systems. It is negative or non-contributory to the patient???s management except for as stated in patient's HPI    ECOG PS: 1    Physical Examination:  Vital Signs: BP 142/85  - Pulse 90  - Temp 36.2 ??C (97.2 ??F) (Tympanic)  - Resp 16  - Ht 170.2 cm (5' 7)  - Wt 80.3 kg (177 lb)  - SpO2 99%  - BMI  27.72 kg/m??   CONSTITUTIONAL: Pleasant man, mild discomfort due to HS  Oral Cavity: MMM, no oral lesions or mucositis  Lymphatics: No lymphadenopathy   CV: RRR; no lower extremity edema  RESP: normal work of breathing  GI: Soft, non-tender, non-distended  SKIN: No skin rashes; groin not examined however tender per patient  NEURO: no focal deficits appreciated,  PSYCH: Normal mood and appropriate affect    LABS: reviewed on Care Everywhere  Lab Results   Component Value Date    WBC 7.3 02/14/2023    HGB 12.9 02/14/2023    HCT 39.7 02/14/2023    PLT 302 02/14/2023       Lab Results   Component Value Date    NA 137 02/14/2023    K 3.6 02/14/2023    CL 96 (L) 02/14/2023    CO2 28.0 02/14/2023    BUN 15 02/14/2023    CREATININE 1.02 02/14/2023    GLU 173 02/14/2023    CALCIUM 10.3 02/14/2023    MG 1.6 07/31/2020    PHOS 3.0 07/31/2020       Lab Results   Component Value Date    BILITOT 0.4 02/14/2023    PROT 8.5 (H) 02/14/2023    ALBUMIN 3.8 02/14/2023    ALT 23 02/14/2023    AST 23 02/14/2023    ALKPHOS 118 (H) 02/14/2023       No results found for: PT, INR, APTT      IMAGING  CT Neck (02/14/2023)  No definitive evidence of recurrent disease in the neck. Previously described hypodensities in the lower neck between the esophagus and trachea are less conspicuous. Recommend attention on follow-up examination.     CT Chest (02/14/2023)  Stable miliary pulmonary metastasis.     PATHOLOGY  THYROID GLAND  8th Edition - Protocol posted: 03/27/2018   THYROID GLAND: RESECTION - All Specimens  Clinical History  No known radiation exposure    SPECIMEN   Procedure  Total thyroidectomy    TUMOR   Tumor Focality  Multifocal    Tumor Characteristics     Tumor Site  Right lobe      Left lobe      Isthmus    Histologic Type  Papillary carcinoma, classic (usual, conventional)    Tumor Size  Greatest Dimension (Centimeters): 3.1 cm   Extrathyroidal Extension  Not identified    Angioinvasion (vascular invasion)  Not identified    Lymphatic Invasion  Not identified    Perineural Invasion  Not identified    Margins  Uninvolved by carcinoma    Distance of Invasive Carcinoma from Closest Margin (Millimeters)  1 mm   Mitotic Rate  4 Mitoses per 2 mm^2   LYMPH NODES   Number of Lymph Nodes Involved  14    Nodal Levels Involved  Level VI    Size of Largest Metastatic Deposit (Centimeters)  5 cm   Extranodal Extension (ENE)  Present    Number of Lymph Nodes Examined  14    Nodal Levels Examined  Level VI    PATHOLOGIC STAGE CLASSIFICATION (pTNM, AJCC 8th Edition)   TNM Descriptors  m (multiple primary tumors)         Primary Tumor (pT)  pT2    Regional Lymph Nodes (pN)  pN1a    Distant Metastasis (pM)  pM1    Site(s)  lung see [id number]      Tempus xT       I have personally reviewed relevant imaging, laboratory  values, existing medical records, and pathology. I have summarized these findings in the oncology history above. THYROID GLAND: RESECTION - All Specimens  Clinical History  No known radiation exposure    SPECIMEN   Procedure  Total thyroidectomy    TUMOR   Tumor Focality  Multifocal    Tumor Characteristics     Tumor Site  Right lobe      Left lobe      Isthmus    Histologic Type  Papillary carcinoma, classic (usual, conventional)    Tumor Size  Greatest Dimension (Centimeters): 3.1 cm   Extrathyroidal Extension  Not identified    Angioinvasion (vascular invasion)  Not identified    Lymphatic Invasion  Not identified    Perineural Invasion  Not identified    Margins  Uninvolved by carcinoma    Distance of Invasive Carcinoma from Closest Margin (Millimeters)  1 mm   Mitotic Rate  4 Mitoses per 2 mm^2   LYMPH NODES   Number of Lymph Nodes Involved  14    Nodal Levels Involved  Level VI    Size of Largest Metastatic Deposit (Centimeters)  5 cm   Extranodal Extension (ENE)  Present    Number of Lymph Nodes Examined  14    Nodal Levels Examined  Level VI    PATHOLOGIC STAGE CLASSIFICATION (pTNM, AJCC 8th Edition)   TNM Descriptors  m (multiple primary tumors)         Primary Tumor (pT)  pT2    Regional Lymph Nodes (pN)  pN1a    Distant Metastasis (pM)  pM1    Site(s)  lung see [id number]      [place]us xT       I have personally reviewed relevant imaging, laboratory values, existing medical records, and pathology. I have summarized these findings in the oncology history above.

## 2023-06-29 NOTE — Unmapped (Signed)
 I called Paul Hines and spoke with him about the new medication Dr. Doyne Genin prescribed during his visit on Wednesday. His next steps are to call the specialty pharmacy and they will help deliver the medication to his house. I ensured he had the number to call and he said he would do so when he got home.

## 2023-06-29 NOTE — Unmapped (Deleted)
 [facility] Specialty Medication Onboarding    Specialty Medication: VITRAKVI  100 mg capsule (larotrectinib )  Prior Authorization: Not Required   Financial Assistance: No - copay  <$25  Final Copay/Day Supply: $4.00 / 30 days    Insurance Restrictions: Yes - max 1 month supply     Notes to Pharmacist: none  Credit Card on File: no  Start Date on Rx:  06/27/23    The triage team has completed the benefits investigation and has determined that the patient is able to fill this medication at [facility]. Please contact the patient to complete the onboarding or follow up with the prescribing physician as needed.

## 2023-07-02 NOTE — Unmapped (Signed)
 [facility] Delivery [facility] Clinical Assessment & Refill Coordination Note    Paul Hines, DOB: 1970-02-09  Phone: [phone] (home)     All above HIPAA information was verified with patient.     Was a Nurse, learning disability used for this call? No    Specialty Medication(s):   Hematology/Oncology: Vitrakvi      Current Outpatient Medications   Medication Sig Dispense Refill    alcohol  swabs  (ALCOHOL  PREP PADS) PadM Use three times a day as needed. 100 each 0    amlodipine  (NORVASC ) 10 MG tablet Take 1 tablet (10 mg total) by mouth daily. 90 tablet 3    atorvastatin  (LIPITOR) 80 MG tablet Take 1 tablet (80 mg total) by mouth nightly. 90 tablet 2    BD LUER-LOK SYRINGE 3 mL 23 gauge x 1 1/2 Syrg       bimekizumab -bkzx 160 mg/mL AtIn Inject the contents of 2 pens (320mg ) every 2 weeks for the first 16 weeks followed by every 4 weeks. 4 mL 4    bimekizumab -bkzx 160 mg/mL AtIn Inject the contents of 2 pens (320mg ) subcutaneous every 4 weeks 2 mL 11    blood sugar diagnostic (GLUCOSE BLOOD) Strp Test blood glucose once daily. 50 strip 11    blood-glucose meter kit Disp. blood glucose meter kit preferred by patient's insurance. Check blood sugars as directed by provider. Dx: Diabetes, E11.9 1 each 11    carvedilol  (COREG ) 6.25 MG tablet Take 1 tablet (6.25 mg total) by mouth two (2) times a day.      cetirizine  (ZYRTEC ) 10 MG tablet Take 1 tablet (10 mg total) by mouth daily. 30 tablet 2    clindamycin  (CLEOCIN  T) 1 % lotion Apply topically two (2) times a day. 60 mL 11    clindamycin  (CLEOCIN ) 300 MG capsule Take 1 capsule (300 mg total) by mouth every twelve (12) hours. 180 capsule 0    clindamycin  (CLEOCIN ) 300 MG capsule Take 1 capsule (300 mg total) by mouth two (2) times a day. 60 capsule 0    DEXCOM G7 RECEIVER Misc Use as directed. 1 each 0    DEXCOM G7 SENSOR Devi Apply Dexcom G7 sensor every 10 days. 9 each 3    empty container Misc Use as directed to dispose of Humira  pens. 1 each 2    gabapentin  (NEURONTIN ) 300 MG capsule Take 1 capsule (300 mg total) by mouth two (2) times a day. 180 capsule 2    hydrOXYzine  (ATARAX ) 25 MG tablet Take 1 tablet (25 mg total) by mouth every eight (8) hours as needed for itching. 30 tablet 0    ibuprofen  (MOTRIN ) 600 MG tablet Take 1 tablet (600 mg total) by mouth two (2) times a day. 60 tablet 1    insulin  glargine (BASAGLAR , LANTUS ) 100 unit/mL (3 mL) injection pen Inject 0.3 mL (30 Units total) under the skin nightly. 15 mL 2    lancets Misc Test blood glucose once daily. 100 each 11    larotrectinib  (VITRAKVI ) 100 mg capsule Take 1 capsule (100 mg total) by mouth two (2) times a day . 60 capsule 5    levothyroxine  (SYNTHROID ) 300 MCG tablet Take 1 tablet (300 mcg total) by mouth daily. 90 tablet 3    losartan -hydroCHLOROthiazide  (HYZAAR ) 100-25 mg per tablet Take 1 tablet by mouth daily.      metFORMIN  (GLUCOPHAGE -XR) 500 MG 24 hr tablet Take 3 tablets (1,500 mg total) by mouth daily with evening meal. 270 tablet 2  naloxone (NARCAN) 0.4 mg/mL injection Infuse 1 mL (0.4 mg total) into a venous catheter.      nicotine  (NICODERM CQ ) 21 mg/24 hr patch Place 1 patch on the skin daily. Remove old patch before applying new one. 28 patch 2    nicotine  polacrilex (NICORETTE) 4 MG gum Chew 1 piece (4 mg total) and park in cheek every hour as needed for smoking cessation. Max is up to 24 pieces/day. 110 each 2    ondansetron (ZOFRAN-ODT) 4 MG disintegrating tablet       pen needle, diabetic (PEN NEEDLE) 31 gauge x 5/16 (8 mm) Ndle Injection Frequency is 1 time per day 100 each 11    pen needle, diabetic (TRUEPLUS PEN NEEDLE) 32 gauge x 5/32 (4 mm) Ndle Use with insulin  glargine once daily 100 each 1    spironolactone  (ALDACTONE ) 25 MG tablet Take 1 tablet (25 mg total) by mouth daily.      triamcinolone  (KENALOG ) 0.1 % ointment Apply topically two (2) times a day. Apply twice a day to affected areas of the skin until the skin feels smooth, then stop. 80 g 0     No current facility-administered medications for this visit.        Changes to medications: Paul Hines reports starting the following medications: new meds due to being discharged    Medication list has been reviewed and updated in Epic: Yes    Allergies   Allergen Reactions    Penicillin Anaphylaxis    Fish Derived     Lisinopril     Peanut Butter Flavor      Per allergy testing    Penicillins Other (See Comments)     Per patient, when he took at age 75 his legs and arms shook. Not sure if it was seizure or not.     Shellfish Containing Products     Soy        Changes to allergies: No    Allergies have been reviewed and updated in Epic: Yes    SPECIALTY MEDICATION ADHERENCE     Vitrakvi  100 mg: 0 days of medicine on hand       Medication Adherence    Patient reported X missed doses in the last month: 0  Specialty Medication: Vitrakvi  100 mg  Patient is on additional specialty medications: No  Informant: patient          Specialty medication(s) dose(s) confirmed: Regimen is correct and unchanged.     Are there any concerns with adherence? No    Adherence counseling provided? Not needed    CLINICAL MANAGEMENT AND INTERVENTION      Clinical Benefit Assessment:    Do you feel the medicine is effective or helping your condition? Patient declined to answer    Clinical Benefit counseling provided? Not needed    Adverse Effects Assessment:    Are you experiencing any side effects? No    Are you experiencing difficulty administering your medicine? No    Quality of Life Assessment:    Quality of Life    Rheumatology  Oncology  1. What impact has your specialty medication had on the reduction of your daily pain or discomfort level?: None  2. On a scale of 1-10, how would you rate your ability to manage side effects associated with your specialty medication? (1=no issues, 10 = unable to take medication due to side effects): 1  Dermatology  Cystic Fibrosis          How many days  over the past month did your condition  keep you from your normal activities? For example, brushing your teeth or getting up in the morning. Inpatients for ~8 days    Have you discussed this with your provider? Not needed    Acute Infection Status:    Acute infections noted within Epic:  No active infections    Patient reported infection: None    Therapy Appropriateness:    Is therapy appropriate based on current medication list, adverse reactions, adherence, clinical benefit and progress toward achieving therapeutic goals? Yes, therapy is appropriate and should be continued     Clinical Intervention:    Was an intervention completed as part of this clinical assessment? No    DISEASE/MEDICATION-SPECIFIC INFORMATION      N/A    Oncology: Is the patient receiving adequate infection prevention treatment? Not applicable  Does the patient have adequate nutritional support? Not applicable    PATIENT SPECIFIC NEEDS     Does the patient have any physical, cognitive, or cultural barriers? No    Is the patient high risk? Yes, patient is taking oral chemotherapy. Appropriateness of therapy as been assessed    Does the patient require physician intervention or other additional services (i.e., nutrition, smoking cessation, social work)? No    Does the patient have an additional or emergency contact listed in their chart? Yes    SOCIAL DETERMINANTS OF HEALTH     At the [facility], we have learned that life circumstances - like trouble affording food, housing, utilities, or transportation can affect the health of many of our patients.   That is why we wanted to ask: are you currently experiencing any life circumstances that are negatively impacting your health and/or quality of life? No    Social Drivers of Health     Food Insecurity: No Food Insecurity (06/27/2023)    Hunger Vital Sign     Worried About Running Out of Food in the Last Year: Never true     Ran Out of Food in the Last Year: Never true   Recent Concern: Food Insecurity - Food Insecurity Present (06/21/2023)    Received from [facility]    Hunger Vital Sign     Worried About Running Out of Food in the Last Year: Sometimes true     Ran Out of Food in the Last Year: Sometimes true   Tobacco Use: High Risk (06/21/2023)    Received from [facility]    Patient History     Smoking Tobacco Use: Every Day     Smokeless Tobacco Use: Never     Passive Exposure: Not on file   Transportation Needs: No Transportation Needs (06/27/2023)    PRAPARE - Transportation     Lack of Transportation (Medical): No     Lack of Transportation (Non-Medical): No   Recent Concern: Transportation Needs - Unmet Transportation Needs (06/09/2023)    Received from [facility] - Transportation     In the past 12 months, has lack of transportation kept you from medical appointments or from getting medications?: Yes     Lack of Transportation (Non-Medical): No   Alcohol  Use: Not At Risk (06/15/2023)    Received from [facility]    AUDIT-C     Frequency of Alcohol  Consumption: 4 or more times a week     Average Number of Drinks: 1 or 2     Frequency of Binge  Drinking: Never   Housing: Low Risk  (06/27/2023)    Housing     Within the past 12 months, have you ever stayed: outside, in a car, in a tent, in an overnight shelter, or temporarily in someone else's home (i.e. couch-surfing)?: No     Are you worried about losing your housing?: No   Recent Concern: Housing - High Risk (06/21/2023)    Received from [facility]    Housing Stability Vital Sign     Unable to Pay for Housing in the Last Year: Yes     Number of Times Moved in the Last Year: 3     Homeless in the Last Year: Yes   Physical Activity: Inactive (01/04/2023)    Received from [facility]    Exercise Vital Sign     Days of Exercise per Week: 0 days     Minutes of Exercise per Session: 0 min   Utilities: Low Risk  (06/27/2023)    Utilities     Within the past 12 months, have you been unable to get utilities (heat, electricity) when it was really needed?: No   Recent Concern: Utilities - At Risk (06/11/2023)    Received from [facility]    [facility] Utilities     Threatened with loss of utilities: Yes   Stress: No Stress Concern Present (01/04/2023)    Received from [facility] of Occupational Health - Occupational Stress Questionnaire     Feeling of Stress : Not at all   Interpersonal Safety: Not At Risk (06/27/2023)    Interpersonal Safety     Unsafe Where You Currently Live: No     Physically Hurt by Anyone: No     Abused by Anyone: No   Substance Use: Not on file (12/06/2022)   Intimate Partner Violence: Not on file   Social Connections: Socially Isolated (01/04/2023)    Received from [facility]    Social Connection and Isolation Panel [NHANES]     Frequency of Communication with Friends and Family: More than three times a week     Frequency of Social Gatherings with Friends and Family: Never     Attends Religious Services: Never     Database administrator or Organizations: No     Attends Engineer, structural: Never     Marital Status: Never married   Physicist, medical Strain: High Risk (06/21/2023)    Received from [facility]    Overall Financial Resource Strain (CARDIA)     Difficulty of Paying Living Expenses: Very hard   Health Literacy: Inadequate Health Literacy (01/04/2023)    Received from [facility]    B1300 Health Literacy     Frequency of need for help with medical instructions: Sometimes   Internet Connectivity: Not on file       Would you be willing to receive help with any of the needs that you have identified today? Not applicable       SHIPPING     Specialty Medication(s) to be Shipped:   Hematology/Oncology: Vitrakvi     Other medication(s) to be shipped: Alcohol  pads, Amlodipine , Atorvastatin , Clindamycin  lotion, Clindamycin , Dexcom Sensor, Dexcom Receiver, Diazepam , Gabapentin , Hydroxyzine , IBU, Levothyroxine , Nicotine  patch, Nicotine  gum, TAC oint     Changes to insurance: No    Cost and Payment: $64 dollars;  patient is Medicaid but cannot afford meds at this time  Delivery Scheduled: Yes, Expected medication delivery date: 07/04/23.     Medication will be delivered via Next Day Courier to the confirmed prescription address in [facility].    The patient will receive a drug information handout for each medication shipped and additional FDA Medication Guides as required.  Verified that patient has previously received a Conservation officer, historic buildings and a Surveyor, mining.    The patient or caregiver noted above participated in the development of this care plan and knows that they can request review of or adjustments to the care plan at any time.      All of the patient's questions and concerns have been addressed.    Cindie Rajagopalan Dennard Fisher, PharmD   [facility] Special[facility] Home Delivery Pharm[facility] Specialty Pharmacist
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
## 2023-07-03 ENCOUNTER — Ambulatory Visit: Admit: 2023-07-03 | Discharge: 2023-07-04

## 2023-07-03 DIAGNOSIS — Z794 Long term (current) use of insulin: Principal | ICD-10-CM

## 2023-07-03 DIAGNOSIS — E119 Type 2 diabetes mellitus without complications: Principal | ICD-10-CM
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
## 2023-07-09 MED FILL — DEXCOM G7 SENSOR DEVICE: ORAL | 90 days supply | Qty: 9 | Fill #0

## 2023-07-09 MED FILL — DEXCOM G7 RECEIVER: ORAL | 30 days supply | Qty: 1 | Fill #0

## 2023-07-09 MED FILL — DIAZEPAM 5 MG TABLET: ORAL | 1 days supply | Qty: 2 | Fill #0

## 2023-07-09 MED FILL — NICOTINE (POLACRILEX) 4 MG GUM: BUCCAL | 5 days supply | Qty: 110 | Fill #0

## 2023-07-09 MED FILL — CLINDAMYCIN 1 % LOTION: TOPICAL | 30 days supply | Qty: 60 | Fill #0

## 2023-07-09 NOTE — Unmapped (Signed)
 [facility]  Established Patient Clinic Note    Assessment/Plan:   Mr.Lucci is a 53 y.o.male    Assessment & Plan  Distressed about housing issues  Pt with complex medical history recently complicated by several months of unstable housing.  He is currently living at [facility] in [place]. However, he is unable to pay rent this month and will likely be evicted later this week.  He does have case management-Andrea Miller-who is following him.  He is enrolled in [facility] which offers more support.     Given uncertainty with housing, medical management will remain challenging. He has a call with Portia Brittle later today (1300). I will plan to reach out to her with an update from today's visit as well.      Will plan to have close follow up and help establish with a new PCP.        Type 2 diabetes mellitus with hyperglycemia, with long-term current use of insulin     Poorly controlled diabetes.  A1c was improving in January but given housing and medication issues, is now back up to 15.1. He is taking lantus  20-25u intermittently with intermittent short acting (1-3u). He has not been checking his blood glucose due to pain with finger sticks      Due to thyroid cancer he is not a good candiate for GLP1. Will consider adding SGLT2 in the future, however, due to severe HA with several ongoing groin lesions with planned unroofing will hold off for now. Once HA undercontrol will consider adding.      Given more pressuring housing concerns with uncertainty on medications, we deferred changes today. Will have him see PharmD next week (instructed to bring pill box and all meds to this apt). Plan to follow up with me or Dr. Katrinka Parr in 2 weeks.       Previous Medication regimen - unclear if still current; :    --Metformin  XR 1000mg  to 1500mg  daily -- taking 2 in the morning but none at night  --Lantus  30u daily per pharmD on 02/01/2023 -- per patient varies between 20-25u. Has not been taking consistently;    --Atorvastatin  80 mg daily -  --amlodipine  10mg    --losartan - hydrochlorothiazide  100/25     Medication changes today:   -None; will plan to adjust once he brings medications in/full assess what he has and needs.   -Will have him continue lantus  25u daily.  -Only take short acting if able to check blood sugar to avoid hypoglycemia        DMII prev med:   --Foot exam - 10/19/2022 UTD- deficits on big toes bilaterally. No major callous or ulcers. Skin intact  --Eyes- DR screening referral placed on 10/19/2022  --labs -   --A1C  06/08/2023  15.1; (9.1  Mar 14 2023;  12/21/2022 13.6.;  A1C >14 on 10/19/2022;)   --Microalb/Cr (10/19/2022): 33.2; on ARB  --BP (goal <130/80)-  above goal, see below for discussion  --ASCVD risk (q9yrs if well controlled): elevated. On statin, unsure compliance           Essential hypertension  Patient with long standing HTN with elevated BP today. No symptoms.  Unclear if he is taking all of his medication as ordered.  Again, we had a good discussion regarding adherence to medication regimen and updated med list printed out for patient today.       I am still hesitant to add  clonidine or hydralazine given his poor compliance and the chance of rebound hypertension.  But these are options once we get him on a consistent regimen and he still hypertensive.       Current Regimen prescribed:   --amlodipine  10mg  -  --losartan - hydrochlorothiazide  100/25  --spironolactone  25mg  daily    --Carvedilol  6.5mg  BID   - there has been confusion if he is taking this or not, today he had several pill bottles with carvedilol  and says he is taking this.  At this point with his blood pressure, not unreasonable to restart.          Hidradenitis suppurativa  Managed by derm with help from surgery. Seen by derm 02/20/2023.  Had housing issues and was lost to follow up for the past several months.     Has follow up at [facility] dermatology 07/17/2023. Unclear what is current regimen is. Strongly encouraged him to keep this apt.      Previous medications:  - Stopped rifampin  300 mg BID 02/20/2023 due to change in cancer tx   - Referred to Dr Jann Melody for unroofing procedure consultation (appointment 02/19/22) - infra-abdominal and inguinal crease unroofed.   - pain clinic appointment 12/30  - Currently getting home health wound care s/p hospitalization  - Continue bimekizumab  injections as instructed and clindamycin  1% cream  -Continue clindamycin  to 300 mg BID  - has follow up with Derm 05/21/2023          Thyroid cancer     patient started on Lenvima  on 02/21/21.  Last CT scans on 12/20/2022 showed SD. Lenvima  has now been on hold since 12/20/22 when he was admitted for soft tissue infection requiring debridement from which he is still recovering.     Had follow up with Dr Doyne Genin on 07/25/2023. Decision to transition from Lenvima ,  to Vitrakvi  (Larotrectinib ). Continue to follow up with surg onc.                Attending: Meredeth Stallion, MD     Subjective   Mr. Angerer is a 53 y.o. male  coming to clinic today for the following issues:    Chief Complaint   Patient presents with    Follow-up     HPI:    53 year old male with poorly controlled diabetes, hidradenitis, and history of thyroid cancer here for follow-up.  Overall, he is doing well today but is still having issues with medication compliance.    More recently, his situation was complicated socially by housing instability and intermittent homelessness over the last several months.  I was previously seeing him for several weeks and we are making headway until this occurred.  Today, he does not know what he is taking or have any medications with him.    [facility] is [facility] living [facility] in [place].  He denies any drug or substance abuse.  He was previously incarcerated for 7 years with release in 2022.  Prior to that he spent 10 years in prison.    I have reviewed the problem list, medications, and allergies and have updated/reconciled them if needed.    Mr. Longoria  reports that he has been smoking cigarettes. He has been exposed to tobacco smoke. He has never used smokeless tobacco.  Health Maintenance   Topic Date Due    Retinal Eye Exam  Never done    Colon Cancer Screening  Never done    COVID-19 Vaccine (7 - Moderna risk 2024-25 season) 04/18/2023  Hemoglobin A1c  09/08/2023    Foot Exam  10/19/2023    Urine Albumin/Creatinine Ratio  10/19/2023    Serum Creatinine Monitoring  06/12/2024    Potassium Monitoring  06/12/2024    DTaP/Tdap/Td Vaccines (6 - Td or Tdap) 11/02/2032    Pneumococcal Vaccine 50+  Completed    Hepatitis C Screen  Completed    Influenza Vaccine  Completed    Zoster Vaccines  Completed       Objective     VITALS: BP 170/107 (BP Site: L Arm, BP Position: Sitting, BP Cuff Size: X-Large) Comment: bp average - Pulse 75  - Temp 37.3 ??C (99.1 ??F) (Temporal)  - Ht 167.6 cm (5' 6)  - Wt 77.7 kg (171 lb 6.4 oz)  - BMI 27.66 kg/m??     Physical Exam  Constitutional:       Appearance: Normal appearance. He is normal weight.   HENT:      Head: Normocephalic and atraumatic.      Nose: Nose normal.      Mouth/Throat:      Mouth: Mucous membranes are moist.     Eyes:      Extraocular Movements: Extraocular movements intact.      Conjunctiva/sclera: Conjunctivae normal.       Cardiovascular:      Rate and Rhythm: Normal rate.   Pulmonary:      Effort: Pulmonary effort is normal.     Musculoskeletal:         General: Normal range of motion.      Cervical back: Normal range of motion.     Skin:     General: Skin is warm.     Neurological:      General: No focal deficit present.      Mental Status: He is alert and oriented to person, place, and time. Mental status is at baseline.     Psychiatric:         Mood and Affect: Mood normal.         Behavior: Behavior normal.         Thought Content: Thought content normal.         Judgment: Judgment normal.         LABS/IMAGING      [facility]  at [facility]  [address], [place], [place] [address]  Telephone [phone]  Fax [phone]  [email or web address]

## 2023-07-09 NOTE — Unmapped (Addendum)
 patient started on Lenvima  on 02/21/21.  Last CT scans on 12/20/2022 showed SD. Lenvima  has now been on hold since 12/20/22 when he was admitted for soft tissue infection requiring debridement from which he is still recovering.     Had follow up with Dr Doyne Genin on 07/25/2023. Decision to transition from Lenvima ,  to Vitrakvi  (Larotrectinib ). Continue to follow up with surg onc.

## 2023-07-09 NOTE — Unmapped (Addendum)
 Poorly controlled diabetes.  A1c was improving in January but given housing and medication issues, is now back up to 15.1. He is taking lantus  20-25u intermittently with intermittent short acting (1-3u). He has not been checking his blood glucose due to pain with finger sticks      Due to thyroid cancer he is not a good candiate for GLP1. Will consider adding SGLT2 in the future, however, due to severe HA with several ongoing groin lesions with planned unroofing will hold off for now. Once HA undercontrol will consider adding.      Given more pressuring housing concerns with uncertainty on medications, we deferred changes today. Will have him see PharmD next week (instructed to bring pill box and all meds to this apt). Plan to follow up with me or Dr. Katrinka Parr in 2 weeks.       Previous Medication regimen - unclear if still current; :    --Metformin  XR 1000mg  to 1500mg  daily -- taking 2 in the morning but none at night  --Lantus  30u daily per pharmD on 02/01/2023 -- per patient varies between 20-25u. Has not been taking consistently;    --Atorvastatin  80 mg daily -  --amlodipine  10mg    --losartan - hydrochlorothiazide  100/25     Medication changes today:   -None; will plan to adjust once he brings medications in/full assess what he has and needs.   -Will have him continue lantus  25u daily.  -Only take short acting if able to check blood sugar to avoid hypoglycemia        DMII prev med:   --Foot exam - 10/19/2022 UTD- deficits on big toes bilaterally. No major callous or ulcers. Skin intact  --Eyes- DR screening referral placed on 10/19/2022  --labs -   --A1C  06/08/2023  15.1; (9.1  Mar 14 2023;  12/21/2022 13.6.;  A1C >14 on 10/19/2022;)   --Microalb/Cr (10/19/2022): 33.2; on ARB  --BP (goal <130/80)-  above goal, see below for discussion  --ASCVD risk (q71yrs if well controlled): elevated. On statin, unsure compliance

## 2023-07-09 NOTE — Unmapped (Addendum)
 Managed by derm with help from surgery. Seen by derm 02/20/2023.  Had housing issues and was lost to follow up for the past several months.     Has follow up at [facility] dermatology 07/17/2023. Unclear what is current regimen is. Strongly encouraged him to keep this apt.      Previous medications:  - Stopped rifampin  300 mg BID 02/20/2023 due to change in cancer tx   - Referred to Dr Jann Melody for unroofing procedure consultation (appointment 02/19/22) - infra-abdominal and inguinal crease unroofed.   - pain clinic appointment 12/30  - Currently getting home health wound care s/p hospitalization  - Continue bimekizumab  injections as instructed and clindamycin  1% cream  -Continue clindamycin  to 300 mg BID  - has follow up with Derm 05/21/2023

## 2023-07-09 NOTE — Unmapped (Addendum)
 Patient with long standing HTN with elevated BP today. No symptoms.  Unclear if he is taking all of his medication as ordered.  Again, we had a good discussion regarding adherence to medication regimen and updated med list printed out for patient today.       I am still hesitant to add clonidine or hydralazine given his poor compliance and the chance of rebound hypertension.  But these are options once we get him on a consistent regimen and he still hypertensive.       Current Regimen prescribed:   --amlodipine  10mg  -  --losartan - hydrochlorothiazide  100/25  --spironolactone  25mg  daily    --Carvedilol  6.5mg  BID   - there has been confusion if he is taking this or not, today he had several pill bottles with carvedilol  and says he is taking this.  At this point with his blood pressure, not unreasonable to restart.

## 2023-07-10 ENCOUNTER — Ambulatory Visit: Admit: 2023-07-10 | Discharge: 2023-07-10 | Payer: Medicaid (Managed Care)

## 2023-07-10 NOTE — Unmapped (Signed)
 COMPLEX CASE MANAGEMENT   FOLLOW UP NOTE  Summary:  Complex Case Manager spoke with patient and verified correct patient using two identifiers today for Complex Case Management follow up. Patient currently resides at Home. Primary concern is assist with rent.      Subjective:    Patient/caregiver reported he was doing OK but his rent was due tomorrow and he didn't have it. CM gave patient resources that offer emergency financial help with rent. Patient wrote resources down.CM encouraged patient to start calling resources as soon as possible.      Objective:     Screenings Completed during visit: None completed at this visit    Barriers to care: Housing Costs/Unstable Housing    Interventions provided: Connection to [employer]: for assist with rent    Progress towards  goal : On Track      Plan:     1. Case Management to: Follow up in 1 weeks    2. Patient/caregiver to: notify PCP/CM of questions/concerns    Discuss at next outreach: Referral Follow-Up    Care Coordination Note updated in [facility]: Yes    This patient is currently receiving Complex Case Management services.      Primary Case Manager: Portia Brittle, RN [phone]  Please contact CM for care plan changes, updates or recent discharges.    High Risk Drivers: Multiple Complex Diagnoses  Primary Disease Process: Cancer, Diabetes, and HTN  Current Residence: Home alone  Primary Medical Home: [facility], [place], DO???s office    Referred to [employer] for assistance establishing a PCP: not applicable   Current services: Oncologist  Patient's Primary Concern is/goals are: Maintain Health, Reduce food insecurity, and housing  Barriers: Difficulty Accessing Healthcare, Financial Stress, Housing Costs/Unstable Housing, and Knowledge Deficit  Strengths: Self-advocacy  Supports: Dentist: lives in recovery house  Interventions provided: Contact Information Provided, Introduction to ICM, and Medication Reconciliation  Follow up with ICM Team Member: 1 week     Future Appointments   Date Time Provider Department Center   07/16/2023  1:00 PM Portia Brittle [facility] PHA [facility]   07/19/2023  9:30 AM Geneva Kerbs, CPP [facility]   07/25/2023 10:15 AM ADULT ONC LAB [facility]   07/25/2023 11:00 AM Gwynn, Lucillie Saba, CPP [facility]   07/27/2023  2:10 PM Ritchie, Zelda Hickman, MD [facility]       Problem List             Diagnosed      Abnormal computed tomography angiography (CTA)       Thyroid cancer       Obesity       Hernia       Diabetes       High blood pressure       Inflammation of sweat glands       Underactive thyroid        Allergies:   Allergies[1]    Medications:  Prior to Admission medications   Medication Dose, Route, Frequency   alcohol  swabs  (ALCOHOL  PREP PADS) PadM Use three times a day as needed.   amlodipine  (NORVASC ) 10 MG tablet 10 mg, Oral, Daily (standard)   atorvastatin  (LIPITOR) 80 MG tablet 80 mg, Oral, Nightly   BD LUER-LOK SYRINGE 3 mL 23 gauge x 1 1/2 Syrg    bimekizumab -bkzx 160 mg/mL AtIn Inject the contents of 2 pens (320mg ) every 2 weeks for the first 16 weeks followed by every 4 weeks.  bimekizumab -bkzx 160 mg/mL AtIn Inject the contents of 2 pens (320mg ) subcutaneous every 4 weeks   blood sugar diagnostic (GLUCOSE BLOOD) Strp Test blood glucose once daily.   blood-glucose meter kit Disp. blood glucose meter kit preferred by patient's insurance. Check blood sugars as directed by provider. Dx: Diabetes, E11.9   carvedilol  (COREG ) 6.25 MG tablet 6.25 mg, 2 times a day (standard)   cetirizine  (ZYRTEC ) 10 MG tablet 10 mg, Oral, Daily (standard)   clindamycin  (CLEOCIN  T) 1 % lotion Topical, 2 times a day (standard)   clindamycin  (CLEOCIN ) 300 MG capsule 300 mg, Oral, Every 12 hours   clindamycin  (CLEOCIN ) 300 MG capsule 300 mg, Oral, 2 times a day   DEXCOM G7 RECEIVER Misc Use as directed.   DEXCOM G7 SENSOR Devi Apply Dexcom G7 sensor every 10 days.   diazePAM  (VALIUM ) 5 MG tablet 10 mg, Oral, Once PRN Procedure, Take 30 minutes before procedure.   empty container Misc Use as directed to dispose of Humira  pens.   gabapentin  (NEURONTIN ) 300 MG capsule 300 mg, Oral, 2 times a day   hydrOXYzine  (ATARAX ) 25 MG tablet 25 mg, Oral, Every 8 hours PRN   ibuprofen  (MOTRIN ) 600 MG tablet 600 mg, Oral, 2 times a day   insulin  glargine (BASAGLAR , LANTUS ) 100 unit/mL (3 mL) injection pen 30 Units, Subcutaneous, Nightly   lancets Misc Test blood glucose once daily.   larotrectinib  (VITRAKVI ) 100 mg capsule 100 mg, Oral, 2 times a day (standard)   levothyroxine  (SYNTHROID ) 300 MCG tablet 300 mcg, Oral, Daily (standard)   losartan -hydroCHLOROthiazide  (HYZAAR ) 100-25 mg per tablet 1 tablet, Daily (standard)   metFORMIN  (GLUCOPHAGE -XR) 500 MG 24 hr tablet 1,500 mg, Oral, Daily   naloxone (NARCAN) 0.4 mg/mL injection 0.4 mg   nicotine  (NICODERM CQ ) 21 mg/24 hr patch 1 patch, Transdermal, Every 24 hours, Remove old patch before applying new one.   nicotine  polacrilex (NICORETTE) 4 MG gum Chew 1 piece (4 mg total) and park in cheek every hour as needed for smoking cessation. Max is up to 24 pieces/day.   ondansetron (ZOFRAN-ODT) 4 MG disintegrating tablet    pen needle, diabetic (PEN NEEDLE) 31 gauge x 5/16 (8 mm) Ndle Injection Frequency is 1 time per day   pen needle, diabetic (TRUEPLUS PEN NEEDLE) 32 gauge x 5/32 (4 mm) Ndle Use with insulin  glargine once daily   spironolactone  (ALDACTONE ) 25 MG tablet 25 mg, Daily (standard)   triamcinolone  (KENALOG ) 0.1 % ointment Topical, 2 times a day (standard), Apply twice a day to affected areas of the skin until the skin feels smooth, then stop.          Portia Brittle, RN Care Manager  07/10/2023        [1]   Allergies  Allergen Reactions    Penicillin Anaphylaxis    Fish Derived     Lisinopril     Peanut Butter Flavor      Per allergy testing    Penicillins Other (See Comments)     Per patient, when he took at age 59 his legs and arms shook. Not sure if it was seizure or not.     Shellfish Containing Products     Soy

## 2023-07-10 NOTE — Unmapped (Signed)
I reviewed with the resident the medical history and the resident's findings on physical examination. I discussed with the resident the patient's diagnoses and concur with the treatment plan as documented in the resident note.

## 2023-07-18 NOTE — Unmapped (Signed)
 Subjective     Reason for visit:    Paul Hines is a 53 y.o. 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  Patient presents to this visit alone.    Known DM Complications: peripheral neuropathy    Date of Last Diabetes Related Visit: 02/01/23 with CPP, 07/10/23 with PCP    Action At Last Diabetes Related Visit:    Recent concerns about several months of unstable housing, currently living at [facility] in [place] but unable to pay rent and likely evicted in June 2025  Increase metformin  XR from 1000 to 1500 mg daily  Continue Lantus  25 units daily  Restart carvedilol  6.25 mg BID  Continue amlodipine  10 mg daily  Continue losartan -HCTZ 100-25 mg daily  Continue spironolactone  25 mg daily    Since Last visit / History of Present Illness:    History of Present Illness  He continues to have difficulty keeping track of meds and left some (including AVS with med chart from last CPP visit) at a previous residence due to a rent increase. He is currently staying at [facility] in [place].    Taking 1-2 tablets of metformin  daily but sometimes forgetting to take them at night. Has been using a fast-acting insulin  prescribed after [facility] admission, administering 2-4 units when BG exceeds around 200 mg/dL. Was without CGM for a period of time and was not checking BG regularly during that time but now has G7 sensor on L arm, although will be expiring tomorrow so would like to replace today while he is in clinic for additional support. He is also on Lantus , taking about 20 units daily recently.    Smokes about 1 pack every 3 days and has been using nicotine  gum but not the patch. Nicotine  replacement therapies have not been very effective for him. Interested in other oral pill options.    Of note, recently prescribed new meds for sleep, mood, and alcohol  cravings, including trazodone, naltrexone, and fluoxetine by psychiatrist at [facility]. Has stopped drinking alcohol .    Reported HTN Regimen:  Amlodipine  10 mg daily  Losartan -HCTZ 100-25 mg daily  Spironolactone  25 mg daily  Carvedilol  6.25 mg BID - not taking recently (has bottle of metoprolol  succinate 50 mg daily from [facility] but unclear if taking regularly)    Reported DM Regimen:    Metformin  XR 1000 mg daily  Lantus  20 units daily  Humalog 3-5 units when BG is high - prescribed by [facility]    DM medications tried in the past:   Glipizide  Insulin  NPH    Medication Adherence and Access:  Since last visit, patient reports missing doses of meds occasionally. Brought all pill bottles and weekly pillbox w/ AM/PM slots he just filled last night.    Discrepancies with pill bottles and pillbox:  1 bottle of atorvastatin  40 mg tabs x2 daily from [facility] + 1 bottle of atorvastatin  80 mg x1 daily from [facility]  Pillbox has some days with up to 4 atorvastatin  40 mg tablets and some days with 1 tab BID  Pillbox with some days with 2 amlodipine  10 mg tabs (rather than just 1)  2 bottles of levothyroxine  300 mg tablets (different manufacturers)  Pillbox with several days with 2 levothyroxine  300 mg tabs  Does not have bottle of carvedilol  6.25 mg tabs  Only 1 day  in pillbox w/ metoprolol  succinate 50 mg tablet, the remaining 6 days do not have metoprolol  or carvedilol   Spironolactone  only filled in 1 day and not in the remaining days  Has not yet added new Rx's for trazodone, naltrexone, or fluoxetine to pillbox  1 day missing PM dose of clindamycin     Still open to pill packs in the future, although just recently picked up 90-day supply of several Rx's so will not be able to refill meds for a couple of months from now.    SMBG per CGM report:  Using Dexcom G7 CGM as instructed recently. Current sensor expires tomorrow (6/20) PM.    For past ~2 weeks:  Average: 223  CMI: not enough data  Very high: 35%  High: 35%  In range: 29%  Low: 1%  Very low: 0%    Hypoglycemia:    Symptoms of hypoglycemia since last visit:  no  If yes, it was treated by: n/a    DM Prevention:  Statin: Taking; high intensity (atorvastatin  80 mg)  Aspirin - unclear if indicated; Not taking    ACEI/ARB - Taking (losartan  100 mg daily - in combo pill w/ HCTZ 25 mg); Urine MA/CR Ratio - elevated urinary albumin excretion of 33.2 ug/g (last checked 10/19/22).  Last eye exam: unknown - [facility]  Last foot exam: 10/19/22   Tobacco Use: Current smoker  Immunizations:   Immunization History   Administered Date(s) Administered    COVID-19 VAC,BIVALENT,MODERNA(BLUE CAP) 11/09/2020    COVID-19 VACCINE,MRNA(MODERNA)(PF) 03/06/2019, 04/03/2019, 12/18/2019, 06/08/2020    Covid-19 Vac, (94yr+) (Comirnaty) Mrna Pfizer  10/19/2022    DTaP, Unspecified Formulation 12/13/1971, 02/09/1972, 04/26/1972, 10/03/1973    INFLUENZA VACCINE IIV3(IM)(PF)6 MOS UP 10/19/2022    Influenza Vaccine Quad(IM)6 MO-Adult(PF) 12/02/2020    Influenza Virus Vaccine, unspecified formulation 11/03/2020    Measles 12/13/1971    Mumps 03/28/1974    Pneumococcal Conjugate 20-valent 11/03/2022    Polio Virus Vaccine, Unspecified Formulation 12/13/1971, 02/09/1972, 04/26/1972, 10/03/1973    Rubella 12/13/1971    SHINGRIX-ZOSTER VACCINE (HZV),RECOMBINANT,ADJUVANTED(IM) 11/03/2022, 03/14/2023    TdaP 11/03/2022     _________________________________________________    Past Medical History: reviewed PMH in epic today    Social History:  Social History     Tobacco Use   Smoking Status Every Day    Types: Cigarettes    Passive exposure: Current   Smokeless Tobacco Never       Medications: Medications reviewed in EPIC medication station and updated today by the clinical Insurance underwriter.         Objective   Vitals:    Vitals:    07/19/23 0943   BP: 137/77   Pulse: 98     Wt Readings from Last 3 Encounters:   07/10/23 77.7 kg (171 lb 6.4 oz)   06/27/23 80.3 kg (177 lb)   03/14/23 80 kg (176 lb 6.4 oz)       There is no height or weight on file to calculate BMI.    The 10-year ASCVD risk score (Arnett DK, et al., 2019) is: 34.6%    Values used to calculate the score:      Age: 84 years      Clincally relevant sex: Male      Is Non-Hispanic African American: Yes      Diabetic: Yes      Tobacco smoker: Yes      Systolic Blood Pressure: 137 mmHg      Is BP treated: Yes  HDL Cholesterol: 71 mg/dL      Total Cholesterol: 261 mg/dL    Note: For patients with SBP <90 or >200, Total Cholesterol <130 or >320, HDL <20 or >100 which are outside of the allowable range, the calculator will use these upper or lower values to calculate the patient???s risk score.      Labs:   Lab Results   Component Value Date    A1C 9.1 (H) 03/14/2023    A1C >14.0 (H) 10/19/2022    A1C 11.4 (H) 06/20/2022     Per CareEverywhere: 15.1% (06/08/23)      Assessment/Plan:    1. Diabetes, type 2: uncontrolled per last A1c 15.1% (06/08/23 per CareEverywhere), which is increased from 9.1% in Feb 2025 and attributed to unstable housing for the past several months. Goal <7% per ADA guidelines. Per 14-day CGM report, time in range of 29% is below goal >70%. Pt continues to struggle with managing insulin  injections and is taking different than intended dose of Lantus  (as well as metformin ) and is now taking Humalog periodically that was given to him during discharge from last admission at [facility], though not timing Humalog around meals or giving with any consistency for elevated glucose values. From a safety standpoint, feel non-insulin  options are better for him so discussed TZD and pt agreeable to this today. Confirmed no hx heart failure, bladder cancer, or other contraindications to TZD. For now, suggested stopping Humalog prandial insulin  for safety and to simplify regimen.  Stop Humalog  Start pioglitazone  15 mg daily  Increase Lantus  insulin  from 20 to 24 units once daily   Continue metformin  XR 1000 mg daily (per pt preference)  Repeat A1c due 09/08/23  Reviewed symptoms and treatment of hypoglycemia, including need to check BG prior to and following treatment.  SMBG instructions: use Dexcom G7 CGM (including receiver) as instructed (change sensors every 10 days)  Reviewed CGM sensor application and pt successfully replaced sensor during today's visit  Future considerations:  Titrate metformin  to target dose of 2000 mg daily as tolerated  Titrate pioglitazone  to max of 45 mg daily as tolerated  Caution w/ SGLT2i given recent SSTI in inguinal region and uncontrolled HS affecting inguinal region (also potentially higher risk of GU infections with significantly elevated A1c)  Could still consider GLP-1 RA given non-medullary thyroid carcinomas not a contraindication and have unclear association - also potential to improve CKD/CV-related outcomes given microalbuminuria present (if SGLT2i not pursued)    2. Hypertension: uncontrolled based on today's clinic BP of 137/77 (HR 98), although notably lower than recent clinic visits and appears pt not taking carvedilol  recently. Goal <130/80 mmHg per ADA guidelines. Will not make any med changes today other than disposing of metoprolol  succinate prescribed by [facility] and sending updated Rx for carvedilol  instead since alpha-blocking properties of carvedilol  make it a better option for also helping w/ BP control.  Continue amlodipine  10 mg daily  Continue losartan -HCTZ 100 mg daily  Continue spironolactone  25 mg daily  Resume carvedilol  6.25 mg BID - sent updated Rx today  Future considerations:  Longer-acting ARB (e.g. irbesartan, telmisartan, olmesartan) and/or thiazide (e.g. chlorthalidone or indapamide) if concern for wearing off effect    3. Non-obstructive CAD - ASCVD risk reduction: last LDL elevated at 145 (10/19/22). Goal LDL <70 given DM + ASCVD risk >20% (as well as notable history of non-obstructive CAD). Appropriately on high intensity statin, though unclear adherence. May need additional non-statin LDL lowering therapies in the future but agreed  to defer for now with focus on consistent adherence to current statin regimen. Also discussed smoking cessation and encouraged pt to use nicotine  patch + gum more consistently for now but will revisit other options such as bupropion and Chantix with future follow-up, deferred initiating either of these today given multiple other med changes today and recently.  Continue atorvastatin  80 mg daily  Recommended pt start using nicotine  21 mg/24h patch more consistently + continue nicotine  4 mg gum PRN cravings  Future considerations:  Add PO ezetimibe if LDL remains above goal despite confirmed adherence w/ max dose statin  Trial bupropion or Chantix for smoking cessation if pt prefers alternatives to NRT - bupropion may provide comorbid benefit w/ depression    4. Medication management and care coordination:  Assisted pt will refilling weekly pillbox today, including correcting discrepancies from after pt filled last night (removed extra tablets of meds he put multiple of in each slot and added meds he had not yet filled for the full week). Instructed pt to add pioglitazone  + carvedilol  after calling [facility] pharmacy and requesting supply of these.  Consolidated pills that pt had multiple bottles of (levothyroxine , clindamycin , amlodipine ) to minimize confusion and avoid pt taking double the prescribed doses. Disposed of atorvastatin  40 mg tablets and instructed pt to use atorvastatin  80 mg tabs only.  Encouraged pt to schedule eye exam, appears ophtho referral previously placed and pt had appt but did not show so explained he should be able to call and reschedule. Provided pt with phone # for [facility] ophtho.  Given continued concerns with med adherence and management, would strongly consider pursuing pill packs. However, will wait another 1-2 months given recent 90-day Rx fills so will be a couple of months before all Rx's will align for refills again. Previously confirmed with [facility] they would be able to provide pill packs and deliver to pt at no additional charge.  Updated and printed med action plan for patient that includes names of all meds, dosing, and indications as listed below:  Name of Medication Morning Evening What It's For   Amlodipine   10 mg tablet 1 tablet  High blood pressure   Atorvastatin   80 mg tablet  1 tablet High cholesterol  Prevent future heart attack and stroke   *Carvedilol *  6.25 mg tablet 1 tablet 1 tablet High blood pressure   Clindamycin   300 mg capsule 1 capsule 1 capsule Skin infection   Levothyroxine   300 mcg tablet 1 tablet  Thyroid supplement   Losartan -HCTZ  100-25 mg tablet 1 tablet  High blood pressure   Lantus  insulin  Inject 24 units once daily. Diabetes   Metformin  XR  500 mg tablet 2 tablets  Diabetes   *Pioglitazone *  15 mg tablet 1 tablet  Diabetes   Spironolactone   25 mg tablet 1 tablet  High blood pressure   Gabapentin   300 mg capsule 1 capsule 1 capsule Cancer-related pain   Fluoxetine  10 mg capsule 1 capsule  Depression   Trazodone  50 mg tablet  1 tablet Sleep and mood   Naltrexone  50 mg tablet  1 tablet Curb alcohol  cravings   Viktrakvi (larotrectinib )  100 mg capsule 1 capsule 1 capsule Thyroid cancer   Bimzelx  injection Inject 2 pens (320 mg) every 2 weeks for 16 weeks. Hidradenitis suppurativa   As Needed Medications   Hydroxyzine   25 mg tablet Take 1 tablet (25 mg) by mouth every 8 hours as needed. Itching   Ibuprofen   600 mg tablet Take 1 tablet (600 mg) by mouth twice daily as needed. Pain   Gabapentin   300 mg capsule Take 1 capsule (300 mg) by mouth twice daily. Pain   Nicotine   21 mg/24 hour patch Place 1 patch on the skin daily. Decrease cigarette cravings   Nicotine   4 mg gum Chew and park 1 piece of gum in cheek every 1 hour as needed. Cigarette cravings       Follow-up: ~1-2 weeks with new PCP and ~2-3 weeks after that with CPP    Future Appointments   Date Time Provider Department Center   07/26/2023 11:15 AM ADULT ONC LAB [facility]   07/26/2023 12:00 PM Lars Joesph Norris, CPP [facility]   07/27/2023  2:10 PM Ritchie, Schuyler Antigua, MD [facility]   08/22/2023 10:30 AM Maryhelen Therisa Reusing, CPP [facility]       I spent a total of 40 minutes face to face with the patient delivering clinical care and providing education/counseling.  _________________________________________________  Therisa Maryhelen, PharmD, CPP, [facility]  Family Medicine Clinical Pharmacist

## 2023-07-18 NOTE — Unmapped (Addendum)
 Call [facility] for medication refills:  [phone] (option #4)    Medications to refill:  Pioglitazone  - new  Carvedilol   Metformin     Schedule eye exam at [facility] eye center:  [facility] [address] [place]  Phone number: [phone]    Diabetes medications:  Lantus  insulin  24 units once daily  Metformin  XR 2 tablets (1000 mg) once daily  Pioglitazone  1 tablet (15 mg) once daily - NEW!    How often and when to check blood sugar:  Use Dexcom G7 continuous glucose monitor (CGM) as instructed. Change sensors every 10 days.    How to treat low blood sugar:  For blood sugar less than 70 --> Treat with 4 ounces of juice or regular soda, or with 3 to 4 glucose tablets. Re-check blood sugar in 15 minutes.    If blood sugar is still less than 70 on re-check, treat again and re-check in 15 minutes.   If blood sugar is over 70 on re-check and it is time to eat your regular meal, eat regular meal and take insulin  as prescribed for that blood sugar.    When to follow-up:  Friday, June 27th at 2:10pm with Dr. Schuyler Centers (new primary care doctor)  Wednesday, July 23rd at 10:30am with Therisa (pharmacist)      Therisa Crisp, PharmD, CPP, [facility]  Family Medicine Clinical Pharmacist  Clinic Phone: [phone]    Name of Medication Morning Evening What It's For   Amlodipine   10 mg tablet 1 tablet  High blood pressure   Atorvastatin   80 mg tablet  1 tablet High cholesterol  Prevent future heart attack and stroke   *Carvedilol *  6.25 mg tablet 1 tablet 1 tablet High blood pressure   Clindamycin   300 mg capsule 1 capsule 1 capsule Skin infection   Levothyroxine   300 mcg tablet 1 tablet  Thyroid supplement   Losartan -HCTZ  100-25 mg tablet 1 tablet  High blood pressure   Lantus  insulin  Inject 24 units once daily. Diabetes   Metformin  XR  500 mg tablet 2 tablets  Diabetes   *Pioglitazone *  15 mg tablet 1 tablet  Diabetes   Spironolactone   25 mg tablet 1 tablet  High blood pressure Gabapentin   300 mg capsule 1 capsule 1 capsule Cancer-related pain   Fluoxetine  10 mg capsule 1 capsule  Depression   Trazodone  50 mg tablet  1 tablet Sleep and mood   Naltrexone  50 mg tablet  1 tablet Curb alcohol  cravings   Viktrakvi (larotrectinib )  100 mg capsule 1 capsule 1 capsule Thyroid cancer   Bimzelx  injection Inject 2 pens (320 mg) every 2 weeks for 16 weeks. Hidradenitis suppurativa   As Needed Medications   Hydroxyzine   25 mg tablet Take 1 tablet (25 mg) by mouth every 8 hours as needed. Itching   Ibuprofen   600 mg tablet Take 1 tablet (600 mg) by mouth twice daily as needed. Pain   Gabapentin   300 mg capsule Take 1 capsule (300 mg) by mouth twice daily. Pain   Nicotine   21 mg/24 hour patch Place 1 patch on the skin daily. Decrease cigarette cravings   Nicotine   4 mg gum Chew and park 1 piece of gum in cheek every 1 hour as needed. Cigarette cravings

## 2023-07-19 MED ORDER — CARVEDILOL 6.25 MG TABLET
ORAL_TABLET | Freq: Two times a day (BID) | ORAL | 2 refills | 30.00000 days | Status: CP
Start: 2023-07-19 — End: ?
  Filled 2023-08-06: qty 60, 30d supply, fill #0

## 2023-07-19 MED ORDER — METFORMIN ER 500 MG TABLET,EXTENDED RELEASE 24 HR
ORAL_TABLET | Freq: Every day | ORAL | 2 refills | 30.00000 days | Status: CP
Start: 2023-07-19 — End: ?
  Filled 2023-08-06: qty 60, 30d supply, fill #0

## 2023-07-19 MED ORDER — INSULIN GLARGINE (U-100) 100 UNIT/ML (3 ML) SUBCUTANEOUS PEN
Freq: Every evening | SUBCUTANEOUS | 2 refills | 62.00000 days | Status: CP
Start: 2023-07-19 — End: ?

## 2023-07-19 MED ORDER — PIOGLITAZONE 15 MG TABLET
ORAL_TABLET | Freq: Every day | ORAL | 5 refills | 30.00000 days | Status: CP
Start: 2023-07-19 — End: 2024-07-18
  Filled 2023-08-06: qty 30, 30d supply, fill #0

## 2023-07-19 NOTE — Unmapped (Signed)
 I spoke to Paul Hines on the phone about changing appointment times for next week. He confirmed that Thursday works for him to come see us .

## 2023-07-20 NOTE — Unmapped (Signed)
 I was the supervising physician in the delivery of the service. Nevan Creighton Ijeoma Derin Matthes, MD

## 2023-07-23 NOTE — Unmapped (Signed)
 Copied from CRM #[id number]. Topic: Access To Clinicians - Medication Question  >> Jul 23, 2023  3:52 PM Zakiya C wrote:  They need assistance with their medication(s).     Molly with [facility] has questions about patient's medication list.  She stated the patient went to [facility] from [facility] and she has noticed on his After Visit Summary on 07/19/2023 there are medications on his list that differ from what [facility] has listed.    Not on [facility] Medication List:   Insulin  lispro  Metroprolol   Risampin     She stated the patient has on her end:  Insulin  Lantus  and is supposed to be taking 30 units nightly   Metformin  was 1000 MG, now shows he should be taking 1500 MG         Please contact Molly with [facility] by [phone] in regards to this request.    Medication request callback turnaround time: 72 business hours. Programmer, systems Notified)

## 2023-07-23 NOTE — Unmapped (Signed)
 COMPLEX CASE MANAGEMENT   FOLLOW UP NOTE  Summary:  Complex Case Manager spoke with patient and verified correct patient using two identifiers today for Complex Case Management follow up. Patient currently resides at Home. Primary concern is housing.      Subjective:    Patient/caregiver reported he was doing well and was now in [place] at [facility] but is looking for low income housing. CM asked patient if he had any questions about his medications and he said no, not since the MD reconciled them.      Objective:     Screenings Completed during visit: None completed at this visit    Barriers to care: Housing Costs/Unstable Housing    Interventions provided: Connection to [employer]: for assist with housing    Progress towards  goal :      Plan:     1. Case Management to: Follow up in 1 weeks    2. Patient/caregiver to: notify PCP/CM of questions/concerns    Discuss at next outreach: Referral Follow-Up    Care Coordination Note updated in [facility]: Yes    This patient is currently receiving Complex Case Management services.      Primary Case Manager: [name], RN [phone]  Please contact CM for care plan changes, updates or recent discharges.    High Risk Drivers: Multiple Complex Diagnoses  Primary Disease Process: Cancer, Diabetes, and HTN  Current Residence: Home alone  Primary Medical Home: [facility], [place], DO???s office    Referred to [employer] for assistance establishing a PCP: not applicable   Current services: Oncologist  Patient's Primary Concern is/goals are: Maintain Health, Reduce food insecurity, and housing  Barriers: Difficulty Accessing Healthcare, Financial Stress, Housing Costs/Unstable Housing, and Knowledge Deficit  Strengths: Self-advocacy  Supports: Dentist: lives in recovery house  Interventions provided: Contact Information Provided, Introduction to ICM, and Medication Reconciliation,sent resources for assist with rent  Follow up with ICM Team Member: 1 week     Future Appointments   Date Time Provider Department Center   07/26/2023 11:15 AM ADULT ONC LAB [facility]   07/26/2023 12:00 PM Lars Joesph Norris, CPP [facility]   07/27/2023  2:10 PM Ritchie, Schuyler Antigua, MD [facility]   07/30/2023  2:00 PM Pinal Alfonso [facility]   08/22/2023 10:30 AM Maryhelen Therisa Reusing, CPP [facility]       Problem List             Diagnosed      Abnormal computed tomography angiography (CTA)       Thyroid cancer       Obesity       Hernia       Diabetes       High blood pressure       Inflammation of sweat glands       Underactive thyroid        Allergies:   Allergies[1]    Medications:  Prior to Admission medications   Medication Dose, Route, Frequency   alcohol  swabs  (ALCOHOL  PREP PADS) PadM Use three times a day as needed.   amlodipine  (NORVASC ) 10 MG tablet 10 mg, Oral, Daily (standard)   atorvastatin  (LIPITOR) 80 MG tablet 80 mg, Oral, Nightly   BD LUER-LOK SYRINGE 3 mL 23 gauge x 1 1/2 Syrg    bimekizumab -bkzx 160 mg/mL AtIn Inject the contents of 2 pens (320mg ) every 2 weeks for the first 16 weeks followed by every 4  weeks.   bimekizumab -bkzx 160 mg/mL AtIn Inject the contents of 2 pens (320mg ) subcutaneous every 4 weeks   blood sugar diagnostic (GLUCOSE BLOOD) Strp Test blood glucose once daily.   blood-glucose meter kit Disp. blood glucose meter kit preferred by patient's insurance. Check blood sugars as directed by provider. Dx: Diabetes, E11.9   carvedilol  (COREG ) 6.25 MG tablet 6.25 mg, Oral, 2 times a day (standard)   clindamycin  (CLEOCIN  T) 1 % lotion Topical, 2 times a day (standard)   clindamycin  (CLEOCIN ) 300 MG capsule 300 mg, Oral, Every 12 hours   clindamycin  (CLEOCIN ) 300 MG capsule 300 mg, Oral, 2 times a day   DEXCOM G7 RECEIVER Misc Use as directed.   DEXCOM G7 SENSOR Devi Apply Dexcom G7 sensor every 10 days.   empty container Misc Use as directed to dispose of Humira  pens.   FLUoxetine (PROZAC) 10 MG capsule 10 mg, Daily (standard)   gabapentin  (NEURONTIN ) 300 MG capsule 300 mg, Oral, 2 times a day   hydrOXYzine  (ATARAX ) 25 MG tablet 25 mg, Oral, Every 8 hours PRN   ibuprofen  (MOTRIN ) 600 MG tablet 600 mg, Oral, 2 times a day   insulin  glargine (BASAGLAR , LANTUS ) 100 unit/mL (3 mL) injection pen 24 Units, Subcutaneous, Nightly   lancets Misc Test blood glucose once daily.   larotrectinib  (VITRAKVI ) 100 mg capsule 100 mg, Oral, 2 times a day (standard)   levothyroxine  (SYNTHROID ) 300 MCG tablet 300 mcg, Oral, Daily (standard)   losartan -hydroCHLOROthiazide  (HYZAAR ) 100-25 mg per tablet 1 tablet, Daily (standard)   metFORMIN  (GLUCOPHAGE -XR) 500 MG 24 hr tablet 1,000 mg, Oral, Daily   naloxone (NARCAN) 0.4 mg/mL injection 0.4 mg   naltrexone (DEPADE) 50 mg tablet 50 mg, Daily (standard)   nicotine  (NICODERM CQ ) 21 mg/24 hr patch 1 patch, Transdermal, Every 24 hours, Remove old patch before applying new one.   nicotine  polacrilex (NICORETTE) 4 MG gum Chew 1 piece (4 mg total) and park in cheek every hour as needed for smoking cessation. Max is up to 24 pieces/day.   ondansetron (ZOFRAN-ODT) 4 MG disintegrating tablet    pen needle, diabetic (PEN NEEDLE) 31 gauge x 5/16 (8 mm) Ndle Injection Frequency is 1 time per day   pen needle, diabetic (TRUEPLUS PEN NEEDLE) 32 gauge x 5/32 (4 mm) Ndle Use with insulin  glargine once daily   pioglitazone  (ACTOS ) 15 MG tablet 15 mg, Oral, Daily (standard)   spironolactone  (ALDACTONE ) 25 MG tablet 25 mg, Daily (standard)   traZODone (DESYREL) 50 MG tablet 50 mg, Nightly   triamcinolone  (KENALOG ) 0.1 % ointment Topical, 2 times a day (standard), Apply twice a day to affected areas of the skin until the skin feels smooth, then stop.          [name], RN Care Manager  07/23/2023        [1]   Allergies  Allergen Reactions    Penicillin Anaphylaxis    Fish Derived     Lisinopril     Peanut Butter Flavor      Per allergy testing    Penicillins Other (See Comments) Per patient, when he took at age 53 his legs and arms shook. Not sure if it was seizure or not.     Shellfish Containing Products     Soy

## 2023-07-26 ENCOUNTER — Ambulatory Visit: Admit: 2023-07-26 | Discharge: 2023-07-26 | Payer: Medicaid (Managed Care)

## 2023-07-26 ENCOUNTER — Ambulatory Visit: Admit: 2023-07-26 | Discharge: 2023-07-26 | Payer: Medicaid (Managed Care) | Attending: Oncology | Primary: Oncology

## 2023-07-26 DIAGNOSIS — C73 Malignant neoplasm of thyroid gland: Principal | ICD-10-CM

## 2023-07-26 LAB — CBC W/ AUTO DIFF
BASOPHILS ABSOLUTE COUNT: 0 10*9/L (ref 0.0–0.1)
BASOPHILS RELATIVE PERCENT: 0.6 %
EOSINOPHILS ABSOLUTE COUNT: 0.2 10*9/L (ref 0.0–0.5)
EOSINOPHILS RELATIVE PERCENT: 3.2 %
HEMATOCRIT: 34.9 % — ABNORMAL LOW (ref 39.0–48.0)
HEMOGLOBIN: 11.4 g/dL — ABNORMAL LOW (ref 12.9–16.5)
LYMPHOCYTES ABSOLUTE COUNT: 1.3 10*9/L (ref 1.1–3.6)
LYMPHOCYTES RELATIVE PERCENT: 24.2 %
MEAN CORPUSCULAR HEMOGLOBIN CONC: 32.6 g/dL (ref 32.0–36.0)
MEAN CORPUSCULAR HEMOGLOBIN: 23.7 pg — ABNORMAL LOW (ref 25.9–32.4)
MEAN CORPUSCULAR VOLUME: 72.6 fL — ABNORMAL LOW (ref 77.6–95.7)
MEAN PLATELET VOLUME: 8.8 fL (ref 6.8–10.7)
MONOCYTES ABSOLUTE COUNT: 0.3 10*9/L (ref 0.3–0.8)
MONOCYTES RELATIVE PERCENT: 6.2 %
NEUTROPHILS ABSOLUTE COUNT: 3.6 10*9/L (ref 1.8–7.8)
NEUTROPHILS RELATIVE PERCENT: 65.8 %
PLATELET COUNT: 251 10*9/L (ref 150–450)
RED BLOOD CELL COUNT: 4.8 10*12/L (ref 4.26–5.60)
RED CELL DISTRIBUTION WIDTH: 15.2 % (ref 12.2–15.2)
WBC ADJUSTED: 5.4 10*9/L (ref 3.6–11.2)

## 2023-07-26 LAB — COMPREHENSIVE METABOLIC PANEL
ALBUMIN: 3.2 g/dL — ABNORMAL LOW (ref 3.4–5.0)
ALKALINE PHOSPHATASE: 105 U/L (ref 46–116)
ALT (SGPT): 10 U/L (ref 10–49)
ANION GAP: 11 mmol/L (ref 5–14)
AST (SGOT): 11 U/L (ref <=34)
BILIRUBIN TOTAL: <0.2 mg/dL — ABNORMAL LOW (ref 0.3–1.2)
BLOOD UREA NITROGEN: 17 mg/dL (ref 9–23)
BUN / CREAT RATIO: 14
CALCIUM: 9 mg/dL (ref 8.7–10.4)
CHLORIDE: 100 mmol/L (ref 98–107)
CO2: 28 mmol/L (ref 20.0–31.0)
CREATININE: 1.19 mg/dL — ABNORMAL HIGH (ref 0.73–1.18)
EGFR CKD-EPI (2021) MALE: 72 mL/min/{1.73_m2} (ref >=60)
GLUCOSE RANDOM: 218 mg/dL — ABNORMAL HIGH (ref 70–179)
POTASSIUM: 3.4 mmol/L (ref 3.4–4.8)
PROTEIN TOTAL: 7.4 g/dL (ref 5.7–8.2)
SODIUM: 139 mmol/L (ref 135–145)

## 2023-07-26 LAB — SLIDE REVIEW

## 2023-07-26 LAB — T4, FREE: FREE T4: 1.48 ng/dL (ref 0.89–1.76)

## 2023-07-26 LAB — TSH: THYROID STIMULATING HORMONE: 0.061 u[IU]/mL — ABNORMAL LOW (ref 0.550–4.780)

## 2023-07-26 MED ORDER — OLANZAPINE 5 MG TABLET
ORAL_TABLET | Freq: Every evening | ORAL | 1 refills | 90.00000 days | Status: CP
Start: 2023-07-26 — End: 2024-07-25

## 2023-07-26 NOTE — Unmapped (Signed)
 Clinical Pharmacist Practitioner: [facility]    Patient Name: Paul Hines  Patient Age: 53 y.o.  Encounter Date: 07/26/2023  Primary Oncologist: Elease Baltimore, DO, MPH    Reason for visit: oral chemotherapy follow-up    ASSESSMENT & PLAN:  1. Metastatic PTC: patient previously on Lenvima  from Jan 2023-Nov 2024, stopped for worsening HS symptoms. Tempus revealed NTRK fusion so started on larotrectinib  which he has been on for ~1 month. Tolerating well with exception of decreased appetite and mild nausea. Will check labs today including TG and LFTs. Repeat CT scans in 1 month.   Continue larotrectinib  100 mg BID    Thyroglobulin, Tumor Marker, IA (ng/mL)   Date Value   02/14/2023 83 (H)   12/20/2022 45 (H)   11/03/2022 39 (H)   10/04/2022 46 (H)   08/08/2022 134 (H)     2. Postsurgical hypothyroidism: goal for TSH suppression (<0.1). TSH improved to 0.061 today with normal FT4.  Continue levothyroxine  300 mcg daily.    3. Decreased appetite: may be 2/2 larotrectinib . Will add olanzapine  as can also help with nausea.  Start olanzapine  5 mg qhs    4. Peripheral neuropathy: likely 2/2 to T2DM. Stable.   Continue gabapentin  300 mg BID    F/U: appt w/ Dr Baltimore in 1 month    ______________________________________________________________________    HPI: LABRANDON Hines is a 53 y.o. male with history of T2DM, hypertension, ischemic cardiac disease s/p catheterization in 2021 and metastatic thyroid cancer, s/p resection with Dr. Jessee on 04/12/20 and neck dissection on 07/28/20. Started Lenvima  on 02/21/21, stopped in November 2024 for worsening HS symptoms. Now on larotrectinib  for NTRK fusion starting May 2025.    Oral chemotherapy: Larotrectinib  100 mg BID  Start date: late May 2025  Specialty pharmacy: [facility]  Copay: $4    Interim History:   - has been on larotrectinib  for ~1 month  - reports overall feeling ok on it although he has no appetite  - sometimes has nausea, sporadic  - ongoing diarrhea ~1-2x/day, no abdominal pain or cramping  - denies dizziness, HA, other CNS effects  - HS continues to be a problem, has nurse coming out 2x/week to help with dressings on buttocks    Adherence: taking appropriately with no missed doses since starting  Drug-Drug Interactions: none per current med list    Oncology History:  Hematology/Oncology History   Thyroid cancer      03/09/2020 Initial Diagnosis    Thyroid cancer (CMS-HCC)     07/29/2021 -  Cancer Staged    Staging form: Thyroid - Differentiated, AJCC 8th Edition  - Clinical: Stage II (cM1, Age at diagnosis: < 55 years) - Signed by Elease Lyndall Baltimore, DO on 07/29/2021           Problem List:   Patient Active Problem List   Diagnosis    Abnormal computed tomography angiography (CTA)    Thyroid cancer       Obesity    Right inguinal hernia    Diabetes       Essential hypertension    Hidradenitis suppurativa    Postoperative hypothyroidism       Medications:  Current Outpatient Medications   Medication Sig Dispense Refill    alcohol  swabs  (ALCOHOL  PREP PADS) PadM Use three times a day as needed. 100 each 0    amlodipine  (NORVASC ) 10 MG tablet Take 1 tablet (10 mg total) by mouth daily. 90 tablet 3  atorvastatin  (LIPITOR) 80 MG tablet Take 1 tablet (80 mg total) by mouth nightly. 90 tablet 2    BD LUER-LOK SYRINGE 3 mL 23 gauge x 1 1/2 Syrg       bimekizumab -bkzx 160 mg/mL AtIn Inject the contents of 2 pens (320mg ) every 2 weeks for the first 16 weeks followed by every 4 weeks. 4 mL 4    bimekizumab -bkzx 160 mg/mL AtIn Inject the contents of 2 pens (320mg ) subcutaneous every 4 weeks 2 mL 11    BIMZELX  AUTOINJECTOR 320 mg/2 mL AtIn Inject 320 mg under the skin.      blood sugar diagnostic (GLUCOSE BLOOD) Strp Test blood glucose once daily. 50 strip 11    blood-glucose meter kit Disp. blood glucose meter kit preferred by patient's insurance. Check blood sugars as directed by provider. Dx: Diabetes, E11.9 1 each 11    carvedilol  (COREG ) 6.25 MG tablet Take 1 tablet (6.25 mg total) by mouth two (2) times a day. 60 tablet 2    cetirizine  (ZYRTEC ) 10 MG tablet Take 1 tablet (10 mg total) by mouth daily.      clindamycin  (CLEOCIN  T) 1 % lotion Apply topically two (2) times a day. 60 mL 11    clindamycin  (CLEOCIN ) 300 MG capsule Take 1 capsule (300 mg total) by mouth every twelve (12) hours. 180 capsule 0    clindamycin  (CLEOCIN ) 300 MG capsule Take 1 capsule (300 mg total) by mouth two (2) times a day. 60 capsule 0    DEXCOM G7 RECEIVER Misc Use as directed. 1 each 0    DEXCOM G7 SENSOR Devi Apply Dexcom G7 sensor every 10 days. 9 each 3    diazePAM  (VALIUM ) 5 MG tablet Take 2 tablets (10 mg total) by mouth 1 (one) time if needed for anxiety for up to 1 dose. Take 30 minutes before procedure. 2 tablet 0    empty container Misc Use as directed to dispose of Humira  pens. 1 each 2    FLUoxetine (PROZAC) 10 MG capsule Take 1 capsule (10 mg total) by mouth daily.      gabapentin  (NEURONTIN ) 300 MG capsule Take 1 capsule (300 mg total) by mouth two (2) times a day. 180 capsule 2    hydrOXYzine  (ATARAX ) 25 MG tablet Take 1 tablet (25 mg total) by mouth every eight (8) hours as needed for itching. 30 tablet 0    ibuprofen  (MOTRIN ) 600 MG tablet Take 1 tablet (600 mg total) by mouth two (2) times a day. 60 tablet 1    insulin  glargine (BASAGLAR , LANTUS ) 100 unit/mL (3 mL) injection pen Inject 0.24 mL (24 Units total) under the skin nightly. 15 mL 2    insulin  lispro (HUMALOG) 100 unit/mL injection pen [The details of the medication are not available because there are pending changes by a home health clinician.]      lancets Misc Test blood glucose once daily. 100 each 11    larotrectinib  (VITRAKVI ) 100 mg capsule Take 1 capsule (100 mg total) by mouth two (2) times a day . 60 capsule 5    levothyroxine  (SYNTHROID ) 300 MCG tablet Take 1 tablet (300 mcg total) by mouth daily. 90 tablet 3    losartan -hydroCHLOROthiazide  (HYZAAR ) 100-25 mg per tablet Take 1 tablet by mouth daily. metFORMIN  (GLUCOPHAGE -XR) 500 MG 24 hr tablet Take 2 tablets (1,000 mg total) by mouth daily with evening meal. 60 tablet 2    metoPROLOL  succinate (TOPROL -XL) 50 MG 24 hr tablet Take 1 tablet (50 mg  total) by mouth.      naltrexone (DEPADE) 50 mg tablet Take 1 tablet (50 mg total) by mouth daily.      nicotine  (NICODERM CQ ) 21 mg/24 hr patch Place 1 patch on the skin daily. Remove old patch before applying new one. 28 patch 2    nicotine  polacrilex (NICORETTE) 4 MG gum Chew 1 piece (4 mg total) and park in cheek every hour as needed for smoking cessation. Max is up to 24 pieces/day. 110 each 2    ondansetron (ZOFRAN-ODT) 4 MG disintegrating tablet       pen needle, diabetic (PEN NEEDLE) 31 gauge x 5/16 (8 mm) Ndle Injection Frequency is 1 time per day 100 each 11    pen needle, diabetic (TRUEPLUS PEN NEEDLE) 32 gauge x 5/32 (4 mm) Ndle Use with insulin  glargine once daily 100 each 1    pioglitazone  (ACTOS ) 15 MG tablet Take 1 tablet (15 mg total) by mouth daily. 30 tablet 5    polyethylene glycol (GLYCOLAX ) 17 gram/dose powder Take 17 g by mouth daily as needed.      rifAMPin  (RIFADIN ) 300 MG capsule Take 1 capsule (300 mg total) by mouth.      spironolactone  (ALDACTONE ) 25 MG tablet Take 1 tablet (25 mg total) by mouth daily.      traZODone (DESYREL) 50 MG tablet Take 1 tablet (50 mg total) by mouth nightly.      triamcinolone  (KENALOG ) 0.1 % ointment Apply topically two (2) times a day. Apply twice a day to affected areas of the skin until the skin feels smooth, then stop. 80 g 0    naloxone (NARCAN) 0.4 mg/mL injection Infuse 1 mL (0.4 mg total) into a venous catheter. (Patient not taking: Reported on 07/26/2023)      OLANZapine  (ZYPREXA ) 5 MG tablet Take 1 tablet (5 mg total) by mouth nightly. 90 tablet 1     No current facility-administered medications for this visit.       Allergies:  Allergies   Allergen Reactions    Penicillin Anaphylaxis    Fish Derived     Lisinopril     Peanut Butter Flavor      Per allergy testing    Penicillins Other (See Comments)     Per patient, when he took at age 79 his legs and arms shook. Not sure if it was seizure or not.     Shellfish Containing Products     Soy        Personal and Social History:   Social History     Tobacco Use    Smoking status: Every Day     Types: Cigarettes     Passive exposure: Current    Smokeless tobacco: Never   Substance Use Topics    Alcohol  use: Yes   He reports that he does not currently use drugs after having used the following drugs: Marijuana.    Family History:  His family history is not on file.    Review of Systems: A complete review of systems was obtained including: Constitutional, Eyes, ENT, Cardiovascular, Respiratory, GI, GU, Musculoskeletal, Skin, Neurological, Psychiatric, Endocrine, Heme/Lymphatic, and Allergic/Immunologic systems. All other systems reviewed and are negative to the patient???s management except for what was mentioned in the interim history.     Vital Signs: BP 140/75  - Pulse 92  - Temp 36.9 ??C (98.5 ??F) (Temporal)  - Resp 16  - Ht 167.6 cm (5' 6)  - Wt 78.2 kg (172 lb 6.4  oz)  - SpO2 99%  - BMI 27.83 kg/m??     Laboratory Data:   No visits with results within 1 Day(s) from this visit.   Latest known visit with results is:   Appointment on 06/27/2023   Component Date Value    Sodium 07/26/2023 139     Potassium 07/26/2023 3.4     Chloride 07/26/2023 100     CO2 07/26/2023 28.0     Anion Gap 07/26/2023 11     BUN 07/26/2023 17     Creatinine 07/26/2023 1.19 (H)     BUN/Creatinine Ratio 07/26/2023 14     eGFR CKD-EPI (2021) Male 07/26/2023 72     Glucose 07/26/2023 218 (H)     Calcium 07/26/2023 9.0     Albumin 07/26/2023 3.2 (L)     Total Protein 07/26/2023 7.4     Total Bilirubin 07/26/2023 <0.2 (L)     AST 07/26/2023 11     ALT 07/26/2023 10     Alkaline Phosphatase 07/26/2023 105     Free T4 07/26/2023 1.48     TSH 07/26/2023 0.061 (L)     WBC 07/26/2023 5.4     RBC 07/26/2023 4.80     HGB 07/26/2023 11.4 (L)     HCT 07/26/2023 34.9 (L)     MCV 07/26/2023 72.6 (L)     MCH 07/26/2023 23.7 (L)     MCHC 07/26/2023 32.6     RDW 07/26/2023 15.2     MPV 07/26/2023 8.8     Platelet 07/26/2023 251     Neutrophils % 07/26/2023 65.8     Lymphocytes % 07/26/2023 24.2     Monocytes % 07/26/2023 6.2     Eosinophils % 07/26/2023 3.2     Basophils % 07/26/2023 0.6     Absolute Neutrophils 07/26/2023 3.6     Absolute Lymphocytes 07/26/2023 1.3     Absolute Monocytes 07/26/2023 0.3     Absolute Eosinophils 07/26/2023 0.2     Absolute Basophils 07/26/2023 0.0     Microcytosis 07/26/2023 Slight (A)     Hypochromasia 07/26/2023 Marked (A)         I spent 25 minutes with Mr.Locher in direct patient care.    Joesph Fuss, PharmD, BCOP, CPP  Clinical Pharmacist Practitioner, [facility]

## 2023-07-27 DIAGNOSIS — I10 Essential (primary) hypertension: Principal | ICD-10-CM

## 2023-07-27 DIAGNOSIS — C73 Malignant neoplasm of thyroid gland: Principal | ICD-10-CM

## 2023-07-27 DIAGNOSIS — L732 Hidradenitis suppurativa: Principal | ICD-10-CM

## 2023-07-27 DIAGNOSIS — Z794 Long term (current) use of insulin: Principal | ICD-10-CM

## 2023-07-27 DIAGNOSIS — E1165 Type 2 diabetes mellitus with hyperglycemia: Principal | ICD-10-CM

## 2023-07-27 MED ORDER — LOSARTAN 100 MG-HYDROCHLOROTHIAZIDE 25 MG TABLET
ORAL_TABLET | Freq: Every day | ORAL | 3 refills | 90.00000 days | Status: CP
Start: 2023-07-27 — End: ?

## 2023-07-27 MED ORDER — SPIRONOLACTONE 25 MG TABLET
ORAL_TABLET | Freq: Every day | ORAL | 3 refills | 90.00000 days | Status: CP
Start: 2023-07-27 — End: ?

## 2023-07-27 NOTE — Unmapped (Addendum)
 Patient brought in his medications but do not see the pioglitizone or the metformin  sent in by pharmacist last week. Will call the pharmacy to see if these were delivered to his new address or old one. Has been holding his lantus  in the setting of poor PO intake which oncology is trying to treat with zyprexa . Glucose on monitor today was over 200. Discussed that on days he is eating little he still will likely need insulin  so recommended taking even half his dose.

## 2023-07-27 NOTE — Unmapped (Addendum)
 Medications that should be coming to your home--> please put these in your pill box.   - losartan -hydrochlorothiazide  (blood pressure medication)-->you are out of these  - spironolactone  (blood pressure medication)--> you are running out of these  - carvedilol  TWICE A DAY (for your blood pressure)--> this is something you are missing in your medications  - metformin  (for your diabetes)  - pioglitazone  (Actos )--> this is a new medication for your diabetes that is missing in your medications  - Vitrakvi  (cancer medication)    For your diabetes:  - If you are having days where you are not eating you can take a lower dose of your lantus  (like 10 units).

## 2023-07-27 NOTE — Unmapped (Signed)
 Disposition:  No Contact Calls    Encounter Reason for Disposition:    Unable to complete triage due to phone connection issues    Encounter Protocols Used:  No Contact or Duplicate Contact Call-A-AH    Called back to back x 3 Call cannot be completed at this time.

## 2023-07-27 NOTE — Unmapped (Signed)
 BP poorly controlled but on review of the medications in his supply and pill box, he is missing carvedilol  and losartan -hydrochlorothiazide  and spironolactone . Will call pharmacy to clarify if refills are needed.

## 2023-07-27 NOTE — Unmapped (Signed)
 Will call his pharmacy to see if they are sending him a refill of his vitrakvi  as he only has a couple pills left.

## 2023-07-27 NOTE — Unmapped (Signed)
 Notes that he has boils on his bottom that are bothering him today. Follows with [facility]. Offered an exam to check for worsening infection, but he declined as he has a home RN that checks on his wounds and feels more comfortable with that.   - continue clindamycin  and rifampin    - continue dermatology follow up

## 2023-07-27 NOTE — Unmapped (Signed)
 [facility]    Patient ID: Paul Hines is a 53 y.o. male who presents for follow up of HTN, DM and medication management  PCP: Cynethia Schindler, Schuyler Antigua, MD      Informant: Patient came to appointment alone.    Assessment/Plan:      Assessment & Plan  Type 2 diabetes mellitus with hyperglycemia, with long-term current use of insulin     Patient brought in his medications but do not see the pioglitizone or the metformin  sent in by pharmacist last week. Will call the pharmacy to see if these were delivered to his new address or old one. Has been holding his lantus  in the setting of poor PO intake which oncology is trying to treat with zyprexa . Glucose on monitor today was over 200. Discussed that on days he is eating little he still will likely need insulin  so recommended taking even half his dose.            Essential hypertension  BP poorly controlled but on review of the medications in his supply and pill box, he is missing carvedilol  and losartan -hydrochlorothiazide  and spironolactone . Will call pharmacy to clarify if refills are needed.        Thyroid cancer     Will call his pharmacy to see if they are sending him a refill of his vitrakvi  as he only has a couple pills left.            Hidradenitis suppurativa  Notes that he has boils on his bottom that are bothering him today. Follows with [facility]. Offered an exam to check for worsening infection, but he declined as he has a home RN that checks on his wounds and feels more comfortable with that.   - continue clindamycin  and rifampin    - continue dermatology follow up       Today we went through his medication supply and pill box and I separated the medications that he needs to make sure are in his pill box for daily use. Will follow up in 2 weeks to check in on his blood pressure and blood sugars. Has follow up with CPP on 7/23.     Called pharmacy to clarify that in order to send his new medications he needs to call and request them. I will let him know this.            Preventive services addressed today  We did not review preventive services today    -- Patient verbalized an understanding of today's assessment and recommendations, as well as the purpose of ongoing medications.    I personally spent 45 minutes face-to-face and non-face-to-face in the care of this patient, which includes all pre, intra, and post visit time on the date of service.    No follow-ups on file.    Subjective:     Chief Complaint   Patient presents with    Establish Care    Follow-up       HPI    - met with oncology for follow up of his metastatic thyroid cancer and continues on larotrecetnib. 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     - meets with clinical pharmacy team to help manage his DMIII. Last A1c was 15 in the setting of unstable housing. Has a CGM.     - HTN: currently taking amlodipine  10 mg, losartan  hydrochlorothiazide  100 mg spiro 25 mg and carvedilol  6.25 mg BID    - HS: following  with [facility]. His boils are still draining.     Brought in his medications:   - gabapentin - only taking as needed- 2-3 times a week.   - spironolactone : not in his pill box  -  amlodipine : in pill box  - clindamycin : in his pill box   - synthroid : in pill box   - losartan -hydrochlorothiazide  is empty   - metformin  is empty  - atrovastain- in pill box  - naltrexone: not taking    - fluoxetine- not taking   - trazodone: 1/2 tablet for sleep    Not drinking alcohol . Has been 2 months without alcohol .       Has not been eating the last several days so has not been using his lantus .       PRN meds:  - diazapam: for pre-procedure                   ROS  As per HPI.    RELEVANT PAST MEDICAL, FAMILY, SURGICAL, SOCIAL HISTORY  Summarized above  ___________________________________  CURRENT MEDS  Current Medications[1]    ___________________________________  ALLERGIES  Allergies[2]      Objective:       Vital Signs  BP 153/98 (BP Site: L Arm, BP Position: Sitting, BP Cuff Size: Medium)  - Pulse 88  - Temp 36.3 ??C (97.4 ??F) (Temporal)  - Ht 170.2 cm (5' 7) Comment: pt reported - Wt 80.3 kg (177 lb)  - BMI 27.72 kg/m??      Exam  Constitutional:       Appearance: Healthy appearance. Not in distress.   Eyes:      Conjunctiva/sclera: Conjunctivae normal.   Pulmonary:      Effort: Pulmonary effort is normal.                 [1]   Current Outpatient Medications   Medication Sig Dispense Refill    alcohol  swabs  (ALCOHOL  PREP PADS) PadM Use three times a day as needed. 100 each 0    amlodipine  (NORVASC ) 10 MG tablet Take 1 tablet (10 mg total) by mouth daily. 90 tablet 3    atorvastatin  (LIPITOR) 80 MG tablet Take 1 tablet (80 mg total) by mouth nightly. 90 tablet 2    BD LUER-LOK SYRINGE 3 mL 23 gauge x 1 1/2 Syrg       bimekizumab -bkzx 160 mg/mL AtIn Inject the contents of 2 pens (320mg ) every 2 weeks for the first 16 weeks followed by every 4 weeks. 4 mL 4    bimekizumab -bkzx 160 mg/mL AtIn Inject the contents of 2 pens (320mg ) subcutaneous every 4 weeks 2 mL 11    BIMZELX  AUTOINJECTOR 320 mg/2 mL AtIn Inject 320 mg under the skin.      blood sugar diagnostic (GLUCOSE BLOOD) Strp Test blood glucose once daily. 50 strip 11    blood-glucose meter kit Disp. blood glucose meter kit preferred by patient's insurance. Check blood sugars as directed by provider. Dx: Diabetes, E11.9 1 each 11    carvedilol  (COREG ) 6.25 MG tablet Take 1 tablet (6.25 mg total) by mouth two (2) times a day. 60 tablet 2    cetirizine  (ZYRTEC ) 10 MG tablet Take 1 tablet (10 mg total) by mouth daily.      clindamycin  (CLEOCIN  T) 1 % lotion Apply topically two (2) times a day. 60 mL 11    clindamycin  (CLEOCIN ) 300 MG capsule Take 1 capsule (300 mg total) by mouth every twelve (12) hours. 180 capsule  0    clindamycin  (CLEOCIN ) 300 MG capsule Take 1 capsule (300 mg total) by mouth two (2) times a day. 60 capsule 0    DEXCOM G7 RECEIVER Misc Use as directed. 1 each 0    DEXCOM G7 SENSOR Devi Apply Dexcom G7 sensor every 10 days. 9 each 3    empty container Misc Use as directed to dispose of Humira  pens. 1 each 2    FLUoxetine (PROZAC) 10 MG capsule Take 1 capsule (10 mg total) by mouth daily.      gabapentin  (NEURONTIN ) 300 MG capsule Take 1 capsule (300 mg total) by mouth two (2) times a day. 180 capsule 2    hydrOXYzine  (ATARAX ) 25 MG tablet Take 1 tablet (25 mg total) by mouth every eight (8) hours as needed for itching. 30 tablet 0    ibuprofen  (MOTRIN ) 600 MG tablet Take 1 tablet (600 mg total) by mouth two (2) times a day. 60 tablet 1    insulin  glargine (BASAGLAR , LANTUS ) 100 unit/mL (3 mL) injection pen Inject 0.24 mL (24 Units total) under the skin nightly. 15 mL 2    insulin  lispro (HUMALOG) 100 unit/mL injection pen [The details of the medication are not available because there are pending changes by a home health clinician.]      lancets Misc Test blood glucose once daily. 100 each 11    larotrectinib  (VITRAKVI ) 100 mg capsule Take 1 capsule (100 mg total) by mouth two (2) times a day . 60 capsule 5    levothyroxine  (SYNTHROID ) 300 MCG tablet Take 1 tablet (300 mcg total) by mouth daily. 90 tablet 3    losartan -hydroCHLOROthiazide  (HYZAAR ) 100-25 mg per tablet Take 1 tablet by mouth daily. 90 tablet 3    metFORMIN  (GLUCOPHAGE -XR) 500 MG 24 hr tablet Take 2 tablets (1,000 mg total) by mouth daily with evening meal. 60 tablet 2    naloxone (NARCAN) 0.4 mg/mL injection Infuse 1 mL (0.4 mg total) into a venous catheter. (Patient not taking: Reported on 07/26/2023)      naltrexone (DEPADE) 50 mg tablet Take 1 tablet (50 mg total) by mouth daily.      nicotine  (NICODERM CQ ) 21 mg/24 hr patch Place 1 patch on the skin daily. Remove old patch before applying new one. 28 patch 2    nicotine  polacrilex (NICORETTE) 4 MG gum Chew 1 piece (4 mg total) and park in cheek every hour as needed for smoking cessation. Max is up to 24 pieces/day. 110 each 2    OLANZapine  (ZYPREXA ) 5 MG tablet Take 1 tablet (5 mg total) by mouth nightly. 90 tablet 1    ondansetron (ZOFRAN-ODT) 4 MG disintegrating tablet       pen needle, diabetic (PEN NEEDLE) 31 gauge x 5/16 (8 mm) Ndle Injection Frequency is 1 time per day 100 each 11    pen needle, diabetic (TRUEPLUS PEN NEEDLE) 32 gauge x 5/32 (4 mm) Ndle Use with insulin  glargine once daily 100 each 1    pioglitazone  (ACTOS ) 15 MG tablet Take 1 tablet (15 mg total) by mouth daily. 30 tablet 5    polyethylene glycol (GLYCOLAX ) 17 gram/dose powder Take 17 g by mouth daily as needed.      rifAMPin  (RIFADIN ) 300 MG capsule Take 1 capsule (300 mg total) by mouth.      spironolactone  (ALDACTONE ) 25 MG tablet Take 1 tablet (25 mg total) by mouth daily. 90 tablet 3    traZODone (DESYREL) 50 MG tablet Take 1 tablet (  50 mg total) by mouth nightly.      triamcinolone  (KENALOG ) 0.1 % ointment Apply topically two (2) times a day. Apply twice a day to affected areas of the skin until the skin feels smooth, then stop. 80 g 0     No current facility-administered medications for this visit.   [2]   Allergies  Allergen Reactions    Penicillin Anaphylaxis    Fish Derived     Lisinopril     Peanut Butter Flavor      Per allergy testing    Penicillins Other (See Comments)     Per patient, when he took at age 8 his legs and arms shook. Not sure if it was seizure or not.     Shellfish Containing Products     Soy

## 2023-07-30 NOTE — Unmapped (Signed)
 HI  There is a fax in your box  requiring your signature.  Thank you       PLEASE PLACE BACK IN ACTION BOX ON TEAM 2

## 2023-07-30 NOTE — Unmapped (Signed)
 Signed and placed in action box for team 2

## 2023-07-30 NOTE — Unmapped (Signed)
 COMPLEX CASE MANAGEMENT   FOLLOW UP NOTE  Summary:  Complex Case Manager spoke with patient and verified correct patient using two identifiers today for Complex Case Management follow up. Patient currently resides at Home. Primary concern is assist with rent.      Subjective:    Patient/caregiver reported he was doing OK. He was on the bus so was unable to go over medications today.      Objective:     Screenings Completed during visit: None completed at this visit    Barriers to care: Financial Stress    Interventions provided: Connection to [employer]: for assist with rent    Progress towards  goal : On Track      Plan:     1. Case Management to: Follow up in 1 weeks    2. Patient/caregiver to: notify PCP/CM of questions/concerns    Discuss at next outreach: Referral Follow-Up    Care Coordination Note updated in [facility]: Yes    This patient is currently receiving Complex Case Management services.      Primary Case Manager: [name], RN [phone]  Please contact CM for care plan changes, updates or recent discharges.    High Risk Drivers: Multiple Complex Diagnoses  Primary Disease Process: Cancer, Diabetes, and HTN  Current Residence: Home alone  Primary Medical Home: [facility], [place], DO???s office    Referred to Strategic Scheduling for assistance establishing a PCP: not applicable   Current services: Oncologist  Patient's Primary Concern is/goals are: Maintain Health, Reduce food insecurity, and housing  Barriers: Difficulty Accessing Healthcare, Financial Stress, Housing Costs/Unstable Housing, and Knowledge Deficit  Strengths: Self-advocacy  Supports: Dentist: lives in recovery house  Interventions provided: Contact Information Provided, Introduction to ICM, and Medication Reconciliation,sent resources for assist with rent,  Follow up with ICM Team Member: 1 week     Future Appointments   Date Time Provider Department Center   08/06/2023  2:00 PM Pinal Alfonso [facility] PHA [facility] 08/10/2023  3:30 PM Ritchie, Schuyler Antigua, MD [facility]   08/22/2023 10:30 AM Maryhelen Therisa Reusing, CPP [facility]   08/29/2023  1:15 PM ADULT ONC LAB [facility]   09/05/2023 12:00 PM IC CT RM 1 [facility] [place] - IC   09/05/2023  1:45 PM ADULT ONC LAB [facility]   09/05/2023  2:40 PM Sheth, Siddharth Hemant, DO [facility]       Problem List             Diagnosed      Abnormal computed tomography angiography (CTA)       Thyroid cancer       Obesity       Hernia       Diabetes       High blood pressure       Inflammation of sweat glands       Underactive thyroid        Allergies:   Allergies[1]    Medications:  Prior to Admission medications   Medication Dose, Route, Frequency   alcohol  swabs  (ALCOHOL  PREP PADS) PadM Use three times a day as needed.   amlodipine  (NORVASC ) 10 MG tablet 10 mg, Oral, Daily (standard)   atorvastatin  (LIPITOR) 80 MG tablet 80 mg, Oral, Nightly   BD LUER-LOK SYRINGE 3 mL 23 gauge x 1 1/2 Syrg    bimekizumab -bkzx 160 mg/mL AtIn Inject the contents of 2 pens (320mg ) every 2 weeks for the first 16 weeks  followed by every 4 weeks.   bimekizumab -bkzx 160 mg/mL AtIn Inject the contents of 2 pens (320mg ) subcutaneous every 4 weeks   BIMZELX  AUTOINJECTOR 320 mg/2 mL AtIn 320 mg   blood sugar diagnostic (GLUCOSE BLOOD) Strp Test blood glucose once daily.   blood-glucose meter kit Disp. blood glucose meter kit preferred by patient's insurance. Check blood sugars as directed by provider. Dx: Diabetes, E11.9   carvedilol  (COREG ) 6.25 MG tablet 6.25 mg, Oral, 2 times a day (standard)   cetirizine  (ZYRTEC ) 10 MG tablet 10 mg, Daily (standard)   clindamycin  (CLEOCIN  T) 1 % lotion Topical, 2 times a day (standard)   clindamycin  (CLEOCIN ) 300 MG capsule 300 mg, Oral, Every 12 hours   clindamycin  (CLEOCIN ) 300 MG capsule 300 mg, Oral, 2 times a day   DEXCOM G7 RECEIVER Misc Use as directed.   DEXCOM G7 SENSOR Devi Apply Dexcom G7 sensor every 10 days.   empty container Misc Use as directed to dispose of Humira  pens.   FLUoxetine (PROZAC) 10 MG capsule 10 mg, Daily (standard)   gabapentin  (NEURONTIN ) 300 MG capsule 300 mg, Oral, 2 times a day   hydrOXYzine  (ATARAX ) 25 MG tablet 25 mg, Oral, Every 8 hours PRN   ibuprofen  (MOTRIN ) 600 MG tablet 600 mg, Oral, 2 times a day   insulin  glargine (BASAGLAR , LANTUS ) 100 unit/mL (3 mL) injection pen 24 Units, Subcutaneous, Nightly   insulin  lispro (HUMALOG) 100 unit/mL injection pen [The details of the medication are not available because there are pending changes by a home health clinician.]   lancets Misc Test blood glucose once daily.   larotrectinib  (VITRAKVI ) 100 mg capsule 100 mg, Oral, 2 times a day (standard)   levothyroxine  (SYNTHROID ) 300 MCG tablet 300 mcg, Oral, Daily (standard)   losartan -hydroCHLOROthiazide  (HYZAAR ) 100-25 mg per tablet 1 tablet, Oral, Daily (standard)   metFORMIN  (GLUCOPHAGE -XR) 500 MG 24 hr tablet 1,000 mg, Oral, Daily   naloxone (NARCAN) 0.4 mg/mL injection 0.4 mg  Patient not taking: Reported on 07/26/2023   naltrexone (DEPADE) 50 mg tablet 50 mg, Daily (standard)   nicotine  (NICODERM CQ ) 21 mg/24 hr patch 1 patch, Transdermal, Every 24 hours, Remove old patch before applying new one.   nicotine  polacrilex (NICORETTE) 4 MG gum Chew 1 piece (4 mg total) and park in cheek every hour as needed for smoking cessation. Max is up to 24 pieces/day.   OLANZapine  (ZYPREXA ) 5 MG tablet 5 mg, Oral, Nightly   ondansetron (ZOFRAN-ODT) 4 MG disintegrating tablet    pen needle, diabetic (PEN NEEDLE) 31 gauge x 5/16 (8 mm) Ndle Injection Frequency is 1 time per day   pen needle, diabetic (TRUEPLUS PEN NEEDLE) 32 gauge x 5/32 (4 mm) Ndle Use with insulin  glargine once daily   pioglitazone  (ACTOS ) 15 MG tablet 15 mg, Oral, Daily (standard)   polyethylene glycol (GLYCOLAX ) 17 gram/dose powder 17 g, Daily PRN   rifAMPin  (RIFADIN ) 300 MG capsule 300 mg   spironolactone  (ALDACTONE ) 25 MG tablet 25 mg, Oral, Daily (standard)   traZODone (DESYREL) 50 MG tablet 50 mg, Nightly   triamcinolone  (KENALOG ) 0.1 % ointment Topical, 2 times a day (standard), Apply twice a day to affected areas of the skin until the skin feels smooth, then stop.          [name], RN Care Manager  07/30/2023        [1]   Allergies  Allergen Reactions    Penicillin Anaphylaxis    Fish Derived  Lisinopril     Peanut Butter Flavor      Per allergy testing    Penicillins Other (See Comments)     Per patient, when he took at age 27 his legs and arms shook. Not sure if it was seizure or not.     Shellfish Containing Products     Soy

## 2023-07-31 NOTE — Unmapped (Signed)
 COMPLEX CASE MANAGEMENT   Brief Follow up Note      Case Manager Assistant sent resources to assist with tasks to reduce barriers to care(Food) to the patient.    Objective:     Screenings Completed during visit: None completed at this visit    Barriers to care: Financial Stress     Interventions provided: Connection to [employer]: Food)    Progress towards  goal : On Track 07/31/23    Plan:     1. Case Management to: Follow up in 1 weeks    2. Patient/caregiver to: review resources    Discuss at next outreach: Resource Coordination    Care Coordination Note updated in [facility]: Yes    Therapist, art  [facility]  [address]  [place], [place] [address]  She/Her/Hers  P: [phone] F: [phone]  [email or web address]

## 2023-07-31 NOTE — Unmapped (Signed)
 Faxed over the (Signed) [facility] form to [facility] [phone].

## 2023-07-31 NOTE — Unmapped (Signed)
 [facility] Refill Coordination Note    Specialty Medication(s) to be Shipped:   Hematology/Oncology: Vitrakvi     Other medication(s) to be shipped: No additional medications requested for fill at this time     Paul Hines, DOB: 1970-11-04  Phone: [phone] (home)       All above HIPAA information was verified with patient.     Was a Nurse, learning disability used for this call? No    Completed refill call assessment today to schedule patient's medication shipment from the [facility]  [phone]).  All relevant notes have been reviewed.     Specialty medication(s) and dose(s) confirmed: Regimen is correct and unchanged.   Changes to medications: [name] reports no changes at this time.  Changes to insurance: No  New side effects reported not previously addressed with a pharmacist or physician: None reported  Questions for the pharmacist: No    Confirmed patient received a Conservation officer, historic buildings and a Surveyor, mining with first shipment. The patient will receive a drug information handout for each medication shipped and additional FDA Medication Guides as required.       DISEASE/MEDICATION-SPECIFIC INFORMATION        N/A    SPECIALTY MEDICATION ADHERENCE     Medication Adherence    Patient reported X missed doses in the last month: 0  Specialty Medication: Vitrakvi  100 mg  Patient is on additional specialty medications: No  Informant: patient              Were doses missed due to medication being on hold? No    Vitrakvi  100 mg   : 8 days of medicine on hand       REFERRAL TO PHARMACIST     Referral to the pharmacist: Not needed      [facility]     Shipping address confirmed in Epic.     Cost and Payment: Patient has a copay of $4. They are aware and have authorized the pharmacy to charge the credit card on file.    Delivery Scheduled: Yes, Expected medication delivery date: 7/7.     Medication will be delivered via Same Day Courier to the prescription address in Epic WAM.    Paul Hines   [facility]  Specialty Technician

## 2023-08-01 DIAGNOSIS — C73 Malignant neoplasm of thyroid gland: Principal | ICD-10-CM

## 2023-08-06 MED FILL — VITRAKVI 100 MG CAPSULE: ORAL | 30 days supply | Qty: 60 | Fill #1

## 2023-08-06 NOTE — Unmapped (Signed)
Complex Case Management  SUMMARY NOTE    Attempted to contact pt today at Cell number to follow up for Complex Case Management services. Left message to return call.; 1st attempt    Discuss at next visit: Referral Follow-Up      Andrea Miller,RN - Case Manager   [facility]  [address],  [place], [place] [address]  p. [phone]   [email or web address]

## 2023-08-15 NOTE — Unmapped (Signed)
Complex Case Management  SUMMARY NOTE    Attempted to contact pt today at Cell number to follow up for Complex Case Management services. Left message to return call.; 1st attempt    Discuss at next visit: Referral Follow-Up      Andrea Miller,RN - Case Manager   [facility]  [address],  [place], [place] [address]  p. [phone]   [email or web address]

## 2023-08-20 NOTE — Unmapped (Signed)
 COMPLEX CASE MANAGEMENT   FOLLOW UP NOTE  Summary:  Complex Case Manager spoke with patient and verified correct patient using two identifiers today for Complex Case Management follow up. Patient currently resides at Home. Primary concern is seeing a Dentist and Eye MD.      Subjective:    Patient/caregiver reported he was doing well. CM gave patient Dieticians number to call and set up an appointment. Patient also inquired about how to get cancer added to his list of Diagnosis at the Social Security office. He already receives SSI and thought it was because of his DM and HTN. CM explained that these were not necessarily reasons for approval for SSI and it was most likely his cancer Dx that qualified him.      Objective:     Screenings Completed during visit: None completed at this visit    Barriers to care: Difficulty Accessing Healthcare    Interventions provided: Connection to External Provider: Dentist and Eye MD    Progress towards  goal : On Track      Plan:     1. Case Management to: Follow up in 1 weeks    2. Patient/caregiver to: notify PCP/CM of questions/concerns    Discuss at next outreach: Referral Follow-Up    Care Coordination Note updated in [facility]: Yes    This patient is currently receiving Complex Case Management services.      Primary Case Manager: [name], RN [phone]  Please contact CM for care plan changes, updates or recent discharges.    High Risk Drivers: Multiple Complex Diagnoses  Primary Disease Process: Cancer, Diabetes, and HTN  Current Residence: Home alone  Primary Medical Home: [place], [place], DO???s office    Referred to Strategic Scheduling for assistance establishing a PCP: not applicable   Current services: Oncologist  Patient's Primary Concern is/goals are: Maintain Health, Reduce food insecurity, and housing  Barriers: Difficulty Accessing Healthcare, Financial Stress, Housing Costs/Unstable Housing, and Knowledge Deficit  Strengths: Self-advocacy  Supports: Dentist: lives in recovery house  Interventions provided: Contact Information Provided, Introduction to ICM, and Medication Reconciliation,sent resources for assist with rent, sent resources to assist with tasks to reduce barriers to care(Food),care plan updated, sent resources to assist with establishing care with Dentist and Optometrist   Follow up with ICM Team Member: 1 week     Future Appointments   Date Time Provider Department Center   08/22/2023 10:30 AM Maryhelen Therisa Reusing, CPP [facility]   08/29/2023  1:15 PM ADULT ONC LAB [facility]   09/05/2023 12:00 PM IC CT RM 1 [facility] [place] - IC   09/05/2023  1:45 PM ADULT ONC LAB [facility]   09/05/2023  2:40 PM Sheth, Siddharth Hemant, DO [facility]   09/26/2023 10:00 AM [name] LABOR, MD [facility]       Problem List             Diagnosed      Abnormal computed tomography angiography (CTA)       Thyroid cancer       Obesity       Hernia       Diabetes       High blood pressure       Inflammation of sweat glands       Underactive thyroid        Allergies:   Allergies[1]    Medications:  Prior to Admission medications   Medication Dose, Route, Frequency  alcohol  swabs  (ALCOHOL  PREP PADS) PadM Use three times a day as needed.   amlodipine  (NORVASC ) 10 MG tablet 10 mg, Oral, Daily (standard)   atorvastatin  (LIPITOR) 80 MG tablet 80 mg, Oral, Nightly   BD LUER-LOK SYRINGE 3 mL 23 gauge x 1 1/2 Syrg    bimekizumab -bkzx 160 mg/mL AtIn Inject the contents of 2 pens (320mg ) every 2 weeks for the first 16 weeks followed by every 4 weeks.   bimekizumab -bkzx 160 mg/mL AtIn Inject the contents of 2 pens (320mg ) subcutaneous every 4 weeks   BIMZELX  AUTOINJECTOR 320 mg/2 mL AtIn 320 mg   blood sugar diagnostic (GLUCOSE BLOOD) Strp Test blood glucose once daily.   blood-glucose meter kit Disp. blood glucose meter kit preferred by patient's insurance. Check blood sugars as directed by provider. Dx: Diabetes, E11.9   carvedilol  (COREG ) 6.25 MG tablet 6.25 mg, Oral, 2 times a day (standard)   cetirizine  (ZYRTEC ) 10 MG tablet 10 mg, Daily (standard)   clindamycin  (CLEOCIN  T) 1 % lotion Topical, 2 times a day (standard)   clindamycin  (CLEOCIN ) 300 MG capsule 300 mg, Oral, Every 12 hours   clindamycin  (CLEOCIN ) 300 MG capsule 300 mg, Oral, 2 times a day   DEXCOM G7 RECEIVER Misc Use as directed.   DEXCOM G7 SENSOR Devi Apply Dexcom G7 sensor every 10 days.   empty container Misc Use as directed to dispose of Humira  pens.   FLUoxetine  (PROZAC ) 10 MG capsule 10 mg, Daily (standard)   gabapentin  (NEURONTIN ) 300 MG capsule 300 mg, Oral, 2 times a day   hydrOXYzine  (ATARAX ) 25 MG tablet 25 mg, Oral, Every 8 hours PRN   ibuprofen  (MOTRIN ) 600 MG tablet 600 mg, Oral, 2 times a day   insulin  glargine (BASAGLAR , LANTUS ) 100 unit/mL (3 mL) injection pen 24 Units, Subcutaneous, Nightly   insulin  lispro (HUMALOG) 100 unit/mL injection pen [The details of the medication are not available because there are pending changes by a home health clinician.]   lancets Misc Test blood glucose once daily.   larotrectinib  (VITRAKVI ) 100 mg capsule 100 mg, Oral, 2 times a day (standard)   levothyroxine  (SYNTHROID ) 300 MCG tablet 300 mcg, Oral, Daily (standard)   losartan -hydroCHLOROthiazide  (HYZAAR ) 100-25 mg per tablet 1 tablet, Oral, Daily (standard)   metFORMIN  (GLUCOPHAGE -XR) 500 MG 24 hr tablet 1,000 mg, Oral, Daily   naloxone (NARCAN) 0.4 mg/mL injection 0.4 mg  Patient not taking: Reported on 07/26/2023   naltrexone (DEPADE) 50 mg tablet 50 mg, Daily (standard)   nicotine  (NICODERM CQ ) 21 mg/24 hr patch 1 patch, Transdermal, Every 24 hours, Remove old patch before applying new one.   nicotine  polacrilex (NICORETTE) 4 MG gum Chew 1 piece (4 mg total) and park in cheek every hour as needed for smoking cessation. Max is up to 24 pieces/day.   OLANZapine  (ZYPREXA ) 5 MG tablet 5 mg, Oral, Nightly   ondansetron (ZOFRAN-ODT) 4 MG disintegrating tablet    pen needle, diabetic (PEN NEEDLE) 31 gauge x 5/16 (8 mm) Ndle Injection Frequency is 1 time per day   pen needle, diabetic (TRUEPLUS PEN NEEDLE) 32 gauge x 5/32 (4 mm) Ndle Use with insulin  glargine once daily   pioglitazone  (ACTOS ) 15 MG tablet 15 mg, Oral, Daily (standard)   polyethylene glycol (GLYCOLAX ) 17 gram/dose powder 17 g, Daily PRN   spironolactone  (ALDACTONE ) 25 MG tablet 25 mg, Oral, Daily (standard)   traZODone (DESYREL) 50 MG tablet 50 mg, Nightly   triamcinolone  (KENALOG ) 0.1 % ointment Topical, 2 times a day (  standard), Apply twice a day to affected areas of the skin until the skin feels smooth, then stop.          [name], RN Care Manager  08/20/2023        [1]   Allergies  Allergen Reactions    Penicillin Anaphylaxis    Fish Derived     Lisinopril     Peanut Butter Flavor      Per allergy testing    Penicillins Other (See Comments)     Per patient, when he took at age 77 his legs and arms shook. Not sure if it was seizure or not.     Shellfish Containing Products     Soy

## 2023-08-20 NOTE — Unmapped (Signed)
 COMPLEX CASE MANAGEMENT   Brief Follow up Note      Case Engineer, manufacturing sent resources to assist with establishing care with Dentist and Optometrist in MyChart.    Objective:     Screenings Completed during visit: None completed at this visit    Barriers to care: Difficulty Navigating Health Care System     Interventions provided: Connection to External Provider: Dentist and Optometrist     Progress towards  goal : On Track 08/20/23    Plan:     1. Case Management to: Follow up in 1 weeks    2. Patient/caregiver to: review resources    Discuss at next outreach: Health Care Navigation     Care Coordination Note updated in [facility]: Yes    [facility] Management Assistant  [facility]  [address]  [place], [place] [address]  She/Her/Hers  P: [phone] F: [phone]  [email or web address]

## 2023-08-21 NOTE — Unmapped (Signed)
 Subjective     Reason for visit:    Paul Hines is a 53 y.o. 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  Patient presents to this visit alone.    Known DM Complications: peripheral neuropathy    Date of Last Diabetes Related Visit: 07/19/23 with CPP, 07/27/23 with PCP    Action At Last Diabetes Related Visit:    Stop Humalog  Start pioglitazone  15 mg daily  Increase Lantus  from 20 to 24 units daily  At PCP visit, pt reports holding insulin  per oncology in s/o poor PO intake which is being treated w/ Zyprexa ; given elevated BG readings PCP recommended taking half dose of insulin  on days when he is still eating a little  Continue metformin  XR 1000 mg daily (per pt preference)  Reviewed Dexcom G7 CGM sensor application during visit and pt successfully replaced sensor during CPP visit  Restart carvedilol  6.25 mg BID  Continue amlodipine  10 mg daily  Continue losartan -HCTZ 100-25 mg daily  Continue spironolactone  25 mg daily  Assisted with refilling weekly pillbox during CPP visit and corrected multiple discrepancies  Provided pt with phone # to call [facility] ophtho for scheduling eye exam  Strongly considering transitioning to pharmacy-provided weekly pill packs but given pt recently filled several meds anticipate refills will not align until ~Aug or Sept 2025    Since Last visit / History of Present Illness:    History of Present Illness  Continues experiencing issues with medication management, particularly with trazodone  for sleep, fluoxetine  for depression, and naltrexone for alcohol  cravings. He does not think he has any trazodone  or fluoxetine , and he no longer uses naltrexone as he does not experience alcohol  cravings anymore.    Brought all pill bottles and pillbox to today's visit but admits he has not been using pillbox the past couple of weeks and instead just taking meds directly from pill bottles.    Confirms starting pioglitazone  but did not have filled until ~2 weeks ago and continues taking decreased dose of metformin  1000 mg in AM only. Not taking increased dose of insulin  as he does not recall this plan and continues taking 20 units daily. Feels BG levels have been slightly higher than before.    Has empty bottle of losartan -HCTZ and only 3 pills left in bottle of spironolactone .    Believes his cancer pill (Viktrakvi) was filled in pillbox incorrectly at last CPP visit and was only in 1 slot per day rather than BID. But he is aware he should take BID and has been taking it as prescribed.    He has been receiving assistance from a home health nurse for medication management. Now open to making the transition to a different pharmacy that provides pill packs. Main concern is that he is not always able to pay medication copays at the time of fills but [facility] pharmacy has always still sent him meds.    Reported HTN Regimen:  Amlodipine  10 mg daily  Losartan -HCTZ 100-25 mg daily  Spironolactone  25 mg daily  Carvedilol  6.25 mg BID    Reported DM Regimen:    Metformin  XR 1000 mg daily  Lantus  20 units daily  Pioglitazone  15 mg daily    DM medications tried in the past:   Glipizide  Insulin  NPH    Medication Adherence and Access:  Since last visit,  patient reports missing doses of meds occasionally. Brought all pill bottles and weekly pillbox w/ AM/PM slots but has not been using recently as noted above.    Discrepancies with pill bottles and pillbox:  Empty bottle of losartan -HCTZ - only filled in 3 days in pillbox  Empty bottle of fluoxetine  - none in pillbox  Several meds not in pillbox at all: spironolactone , carvedilol , levothyroxine , pioglitazone   Takes gabapentin  as needed and does not want in pillbox    Still open to pill packs in the future, although just recently picked up 90-day supply of several Rx's so will not be able to refill meds for a couple of months from now.    SMBG per CGM report:  Using Dexcom G7 CGM as instructed.    For past ~2 weeks:  Average: 238  CMI: 9.0%  Very high: 40%  High: 40%  In range: 20%  Low: 0%  Very low: 0%    Hypoglycemia:    Symptoms of hypoglycemia since last visit:  no  If yes, it was treated by: n/a    DM Prevention:  Statin: Taking; high intensity (atorvastatin  80 mg)  Aspirin - unclear if indicated; Not taking    ACEI/ARB - Taking (losartan  100 mg daily - in combo pill w/ HCTZ 25 mg); Urine MA/CR Ratio - elevated urinary albumin excretion of 33.2 ug/g (last checked 10/19/22).  Last eye exam: unknown - DUE  Last foot exam: 10/19/22   Tobacco Use: Current smoker  Immunizations:   Immunization History   Administered Date(s) Administered    COVID-19 VAC,BIVALENT,MODERNA(BLUE CAP) 11/09/2020    COVID-19 VACCINE,MRNA(MODERNA)(PF) 03/06/2019, 04/03/2019, 12/18/2019, 06/08/2020    Covid-19 Vac, (52yr+) (Comirnaty) Mrna Pfizer  10/19/2022    DTaP, Unspecified Formulation 12/13/1971, 02/09/1972, 04/26/1972, 10/03/1973    INFLUENZA VACCINE IIV3(IM)(PF)6 MOS UP 10/19/2022    Influenza Vaccine Quad(IM)6 MO-Adult(PF) 12/02/2020    Influenza Virus Vaccine, unspecified formulation 11/03/2020    Measles 12/13/1971    Mumps 03/28/1974    Pneumococcal Conjugate 20-valent 11/03/2022    Polio Virus Vaccine, Unspecified Formulation 12/13/1971, 02/09/1972, 04/26/1972, 10/03/1973    Rubella 12/13/1971    SHINGRIX-ZOSTER VACCINE (HZV),RECOMBINANT,ADJUVANTED(IM) 11/03/2022, 03/14/2023    TdaP 11/03/2022     _________________________________________________    Past Medical History: reviewed PMH in epic today    Social History:  Social History     Tobacco Use   Smoking Status Every Day    Types: Cigarettes    Passive exposure: Current   Smokeless Tobacco Never       Medications: Medications reviewed in EPIC medication station and updated today by the clinical Insurance underwriter.         Objective   Vitals:    Vitals: 08/22/23 1055   BP: 151/93   Pulse: 65      Wt Readings from Last 3 Encounters:   07/27/23 80.3 kg (177 lb)   07/26/23 78.2 kg (172 lb 6.4 oz)   07/10/23 77.7 kg (171 lb 6.4 oz)       There is no height or weight on file to calculate BMI.    The 10-year ASCVD risk score (Arnett DK, et al., 2019) is: 40.1%    Values used to calculate the score:      Age: 20 years      Clincally relevant sex: Male      Is Non-Hispanic African American: Yes      Diabetic: Yes      Tobacco smoker: Yes      Systolic  Blood Pressure: 151 mmHg      Is BP treated: Yes      HDL Cholesterol: 71 mg/dL      Total Cholesterol: 261 mg/dL    Note: For patients with SBP <90 or >200, Total Cholesterol <130 or >320, HDL <20 or >100 which are outside of the allowable range, the calculator will use these upper or lower values to calculate the patient???s risk score.      Labs:   Lab Results   Component Value Date    A1C 9.1 (H) 03/14/2023    A1C >14.0 (H) 10/19/2022    A1C 11.4 (H) 06/20/2022     Per CareEverywhere: 15.1% (06/08/23)      Assessment/Plan:    1. Diabetes, type 2: uncontrolled per last A1c 15.1% (06/08/23 per CareEverywhere), which is increased from 9.1% in Feb 2025 and attributed to unstable housing for the past several months. Goal <7% per ADA guidelines. Per 14-day CGM report, time in range of 20% is decreased from last time and remains below goal >70% despite initiation of pioglitazone . However, pioglitazone /TZDs known to take weeks to see full effect on BG lowering and has only been taking ~2 weeks so hopeful we will see improvements in the coming weeks and will plan to titrate to 30 mg dose once pt finishes current 30-day supply of 15 mg. Again reviewed recommendation to titrate basal insulin  as well.  Continue pioglitazone  15 mg daily until 30-day supply finished, then increase to 30 mg daily - sent new Rx for 30 mg tabs today  Increase Lantus  insulin  from 20 to 24 units once daily   Continue metformin  XR 1000 mg daily  Repeat A1c due 09/08/23 - plan to check at next clinic visit  Reviewed symptoms and treatment of hypoglycemia, including need to check BG prior to and following treatment.  SMBG instructions: use Dexcom G7 CGM (including receiver) as instructed (change sensors every 10 days)  Future considerations:  Titrate metformin  to target dose of 2000 mg daily as tolerated - pt has preferred to stay at 1000 mg daily at recent visits  Titrate pioglitazone  to max of 45 mg daily as tolerated  Caution w/ SGLT2i given recent SSTI in inguinal region and uncontrolled HS affecting inguinal region (also potentially higher risk of GU infections with significantly elevated A1c)  Could still consider GLP-1 RA given non-medullary thyroid carcinomas not a contraindication and have unclear association - also potential to improve CKD/CV-related outcomes given microalbuminuria present (if SGLT2i not pursued)    2. Hypertension: uncontrolled based on today's clinic BP of 151/93 (HR 65), although does not appear pt has been taking losartan -HCTZ due to pill bottle being empty. Goal <130/80 mmHg per ADA guidelines. Encouraged pt to refill losartan -HCTZ as well as spironolactone  (only 3 pills left in bottle) and otherwise will not make any changes to med regimen today.  Continue amlodipine  10 mg daily  Continue losartan -HCTZ 100 mg daily - pt to call for refill ASAP  Continue spironolactone  25 mg daily - pt to call for refill  Continue carvedilol  6.25 mg BID  Future considerations:  Longer-acting ARB (e.g. irbesartan, telmisartan, olmesartan) and/or thiazide (e.g. chlorthalidone or indapamide) if concern for wearing off effect    3. Non-obstructive CAD - ASCVD risk reduction: last LDL elevated at 145 (10/19/22). Goal LDL <70 given DM + ASCVD risk >20% (as well as notable history of non-obstructive CAD). Appropriately on high intensity statin, though unclear adherence. May need additional non-statin LDL lowering therapies in the future  but agreed to defer for now with focus on consistent adherence to current statin regimen. Also previously discussed smoking cessation and encouraged pt to use nicotine  patch + gum more consistently and may revisit other options such as bupropion and Chantix with future follow-up.  Continue atorvastatin  80 mg daily  Continue nicotine  21 mg/24h patch + nicotine  4 mg gum PRN cravings  Future considerations:  Add PO ezetimibe if LDL remains above goal despite confirmed adherence w/ max dose statin  Trial bupropion or Chantix for smoking cessation if pt prefers alternatives to NRT - bupropion may provide comorbid benefit w/ depression    4. Medication management and care coordination:  Assisted pt will filling weekly pillbox today and encouraged pt to resume using pillbox to help keep track of med adherence rather than remembering to take directly from pill bottles. Did not have adequate supply of all meds to fill entire pillbox, so instructed pt to add the following meds to pillbox once refilled:  Spironolactone : Sunday through Wednesday  Losartan -HCTZ: Monday through Wednesday  Fluoxetine : Sunday through Saturday  Trazodone : Sunday through Tuesday (evening slot)  Given continued concerns with med adherence and management, strongly considering pursuing pill packs and agreed to investigate after today's visit. However, anticipate may still be too soon to align all Rx's since he had several 90-day Rx fills in early June. Sent all Rx's (for daily pills) to [facility] to start to coordinate setting up pill packs and previously confirmed they can deliver to pt at no additional charge.  For now, will keep Viktrakvi specialty medication + all other meds that will not be filled in pill pack (e.g. Lantus  insulin , Dexcom CGM sensors, PRN meds, etc.) w/ [facility].  Updated and printed med action plan for patient that includes names of all meds, dosing, and indications as listed below:  Name of Medication Morning Evening What It's For Amlodipine   10 mg tablet 1 tablet  High blood pressure   Atorvastatin   80 mg tablet  1 tablet High cholesterol  Prevent future heart attack and stroke   Carvedilol   6.25 mg tablet 1 tablet 1 tablet High blood pressure   Clindamycin   300 mg capsule 1 capsule 1 capsule Skin infection   Levothyroxine   300 mcg tablet 1 tablet  Thyroid supplement   Losartan -HCTZ  100-25 mg tablet 1 tablet  High blood pressure   Lantus  insulin  Inject 24 units once daily. Diabetes   Metformin  XR  500 mg tablet 2 tablets  Diabetes   Pioglitazone   15 mg tablet 1 tablet  Diabetes  *increase to 30 mg with next refill*   Spironolactone   25 mg tablet 1 tablet  High blood pressure   Gabapentin   300 mg capsule 1 capsule 1 capsule Cancer-related pain   Fluoxetine   10 mg capsule 1 capsule  Depression   Trazodone   50 mg tablet  1 tablet Sleep and mood   Viktrakvi (larotrectinib )  100 mg capsule 1 capsule 1 capsule Thyroid cancer   Bimzelx  injection Inject 2 pens (320 mg) every 2 weeks for 16 weeks. Hidradenitis suppurativa   As Needed Medications   Hydroxyzine   25 mg tablet Take 1 tablet (25 mg) by mouth every 8 hours as needed. Itching   Ibuprofen   600 mg tablet Take 1 tablet (600 mg) by mouth twice daily as needed. Pain   Gabapentin   300 mg capsule Take 1 capsule (300 mg) by mouth twice daily. Pain   Nicotine   21 mg/24 hour patch Place 1  patch on the skin daily. Decrease cigarette cravings   Nicotine   4 mg gum Chew and park 1 piece of gum in cheek every 1 hour as needed. Cigarette cravings       Follow-up: ~1 month with PCP and ~2-3 weeks after that with CPP  Note: prefers to continue seeing Dr. Albertina as PCP but no available slots for the next several months so will keep appt w/ Dr. Marland in August    Future Appointments   Date Time Provider Department Center   08/27/2023  1:40 PM Cleotilde Odor [facility]   08/29/2023  1:15 PM ADULT ONC LAB [facility]   09/05/2023 12:00 PM IC CT RM 1 [facility] [place] - IC   09/05/2023  1:45 PM ADULT ONC LAB [facility]   09/05/2023  2:40 PM Sheth, Siddharth Hemant, DO [facility]   09/26/2023 10:00 AM [name] LABOR, MD [facility]   10/10/2023 10:30 AM Maryhelen Therisa Reusing, CPP [facility]       I spent a total of 40 minutes face to face with the patient delivering clinical care and providing education/counseling.  _________________________________________________  Therisa Maryhelen, PharmD, CPP, [facility]  Family Medicine Clinical Pharmacist

## 2023-08-21 NOTE — Unmapped (Addendum)
 Call [facility] for medication refills:  [phone] (option #4)    Call to refill the following medications (if [facility] can't fill them):  Losartan -HCTZ  Fluoxetine   Spironolactone   Pioglitazone  30 mg tablets    Schedule eye exam at [facility] eye center:  [facility] [address]  Phone number: [phone]    Call [facility] dermatology to schedule a follow-up visit: [phone]     Diabetes medications:  Lantus  insulin  24 units once daily - INCREASE!  Metformin  XR 2 tablets (1000 mg) once daily  Pioglitazone  1 tablet (15 mg) once daily until supply finished, THEN INCREASE to 30 mg daily    How often and when to check blood sugar:  Use Dexcom G7 continuous glucose monitor (CGM) as instructed. Change sensors every 10 days.    How to treat low blood sugar:  For blood sugar less than 70 --> Treat with 4 ounces of juice or regular soda, or with 3 to 4 glucose tablets. Re-check blood sugar in 15 minutes.    If blood sugar is still less than 70 on re-check, treat again and re-check in 15 minutes.   If blood sugar is over 70 on re-check and it is time to eat your regular meal, eat regular meal and take insulin  as prescribed for that blood sugar.    When to follow-up:  Wednesday, August 27th at 10am with Dr. Marland  Wednesday, September 10th at 10:30am with Therisa (pharmacist)      Therisa Crisp, PharmD, CPP, [facility]  Family Medicine Clinical Pharmacist  Clinic Phone: [phone]    *Add these medications to the rest of the pillbox for this week*  Spironolactone : Sunday through Wednesday  Losartan -HCTZ: Monday through Wednesday  Fluoxetine : Sunday through Saturday  Trazodone : Sunday through Tuesday (evening slot)            Name of Medication Morning Evening What It's For   Amlodipine   10 mg tablet 1 tablet  High blood pressure   Atorvastatin   80 mg tablet  1 tablet High cholesterol  Prevent future heart attack and stroke   Carvedilol   6.25 mg tablet 1 tablet 1 tablet High blood pressure   Clindamycin   300 mg capsule 1 capsule 1 capsule Skin infection   Levothyroxine   300 mcg tablet 1 tablet  Thyroid supplement   Losartan -HCTZ  100-25 mg tablet 1 tablet  High blood pressure   Lantus  insulin  Inject 24 units once daily. Diabetes   Metformin  XR  500 mg tablet 2 tablets  Diabetes   Pioglitazone   15 mg tablet 1 tablet  Diabetes  *increase to 30 mg with next refill*   Spironolactone   25 mg tablet 1 tablet  High blood pressure   Gabapentin   300 mg capsule 1 capsule 1 capsule Cancer-related pain   Fluoxetine   10 mg capsule 1 capsule  Depression   Trazodone   50 mg tablet  1 tablet Sleep and mood   Viktrakvi (larotrectinib )  100 mg capsule 1 capsule 1 capsule Thyroid cancer   Bimzelx  injection Inject 2 pens (320 mg) every 2 weeks for 16 weeks. Hidradenitis suppurativa   As Needed Medications   Hydroxyzine   25 mg tablet Take 1 tablet (25 mg) by mouth every 8 hours as needed. Itching   Ibuprofen   600 mg tablet Take 1 tablet (600 mg) by mouth twice daily as needed. Pain   Gabapentin   300 mg capsule Take 1 capsule (300 mg) by mouth twice daily. Pain   Nicotine   21 mg/24 hour patch Place 1 patch on the skin daily. Decrease cigarette cravings   Nicotine   4 mg gum Chew and park 1 piece of gum in cheek every 1 hour as needed. Cigarette cravings

## 2023-08-22 MED ORDER — PIOGLITAZONE 30 MG TABLET
ORAL_TABLET | Freq: Every day | ORAL | 5 refills | 30.00000 days | Status: CP
Start: 2023-08-22 — End: ?

## 2023-08-22 MED ORDER — FLUOXETINE 10 MG CAPSULE
ORAL_CAPSULE | Freq: Every day | ORAL | 2 refills | 30.00000 days | Status: CP
Start: 2023-08-22 — End: ?

## 2023-08-24 MED ORDER — ATORVASTATIN 80 MG TABLET
ORAL_TABLET | Freq: Every evening | ORAL | 11 refills | 30.00000 days | Status: CP
Start: 2023-08-24 — End: ?

## 2023-08-24 MED ORDER — SPIRONOLACTONE 25 MG TABLET
ORAL_TABLET | Freq: Every day | ORAL | 11 refills | 30.00000 days | Status: CP
Start: 2023-08-24 — End: ?

## 2023-08-24 MED ORDER — FLUOXETINE 10 MG CAPSULE
ORAL_CAPSULE | Freq: Every day | ORAL | 2 refills | 30.00000 days | Status: CP
Start: 2023-08-24 — End: ?

## 2023-08-24 MED ORDER — METFORMIN ER 500 MG TABLET,EXTENDED RELEASE 24 HR
ORAL_TABLET | Freq: Every day | ORAL | 2 refills | 30.00000 days | Status: CP
Start: 2023-08-24 — End: ?

## 2023-08-24 MED ORDER — LEVOTHYROXINE 300 MCG TABLET
ORAL_TABLET | Freq: Every day | ORAL | 11 refills | 30.00000 days | Status: CP
Start: 2023-08-24 — End: 2024-08-23

## 2023-08-24 MED ORDER — LOSARTAN 100 MG-HYDROCHLOROTHIAZIDE 25 MG TABLET
ORAL_TABLET | Freq: Every day | ORAL | 11 refills | 30.00000 days | Status: CP
Start: 2023-08-24 — End: ?

## 2023-08-24 MED ORDER — TRAZODONE 50 MG TABLET
ORAL_TABLET | Freq: Every evening | ORAL | 2 refills | 30.00000 days | Status: CP
Start: 2023-08-24 — End: ?

## 2023-08-24 MED ORDER — PIOGLITAZONE 30 MG TABLET
ORAL_TABLET | Freq: Every day | ORAL | 5 refills | 30.00000 days | Status: CP
Start: 2023-08-24 — End: ?

## 2023-08-24 MED ORDER — CARVEDILOL 6.25 MG TABLET
ORAL_TABLET | Freq: Two times a day (BID) | ORAL | 5 refills | 30.00000 days | Status: CP
Start: 2023-08-24 — End: ?

## 2023-08-24 MED ORDER — AMLODIPINE 10 MG TABLET
ORAL_TABLET | Freq: Every day | ORAL | 11 refills | 30.00000 days | Status: CP
Start: 2023-08-24 — End: ?

## 2023-08-27 NOTE — Unmapped (Signed)
I was the supervising physician in the delivery of the service. ASHLEY M RIETZ, MD

## 2023-08-27 NOTE — Unmapped (Signed)
 [facility] Family Medicine Clinical Pharmacist Note    [facility] to confirm pill packs to be set up for pt after sending Rx's last week. They asked for allergy history before they can finalize pt's profile, which I gave verbally. While waiting on allergies, they did not process Rx's just yet so unable to confirm if any or how many are too soon to fill but did mention that Medicaid typically will not allow for early fill overrides. But any Rx's that will go through should be delivered this week on Wed 7/30, which is the next time they are doing deliveries in the [place] area.    Therisa Crisp, PharmD, CPP, [facility]  Family Medicine Clinical Pharmacist

## 2023-08-27 NOTE — Unmapped (Signed)
Complex Case Management  SUMMARY NOTE    Attempted to contact pt today at Cell number to follow up for Complex Case Management services. Left message to return call.; 1st attempt    Discuss at next visit: Referral Follow-Up      Andrea Miller,RN - Case Manager   [facility]  [address],  [place], [place] [address]  p. [phone]   [email or web address]

## 2023-08-28 NOTE — Unmapped (Signed)
 [facility] home health form faxed to [phone]

## 2023-08-29 ENCOUNTER — Ambulatory Visit: Admit: 2023-08-29 | Payer: Medicaid (Managed Care)

## 2023-08-30 DIAGNOSIS — C73 Malignant neoplasm of thyroid gland: Principal | ICD-10-CM

## 2023-08-30 NOTE — Unmapped (Signed)
 Hi Dr. Albertina,    There is a form in your box requiring your signature

## 2023-09-03 NOTE — Unmapped (Signed)
 COMPLEX CASE MANAGEMENT   Brief Follow up Note      Case Engineer, manufacturing sent resources to assist with establishing care with Behavior Therapist in network via MyChart.    Objective:     Screenings Completed during visit: None completed at this visit    Barriers to care: Difficulty Navigating Health Care System     Interventions provided: Connection to External Provider: Behavior Therapist    Progress towards  goal : On Track 09/03/23    Plan:     1. Case Management to: Follow up in 1 weeks    2. Patient/caregiver to: review resources    Discuss at next outreach: Health Care Navigation     Care Coordination Note updated in [facility]: Yes    [facility] Management Assistant  [facility]  [address]  [place], [place] [address]  She/Her/Hers  P: [phone] F: [phone]  [email or web address]

## 2023-09-03 NOTE — Unmapped (Signed)
 COMPLEX CASE MANAGEMENT   FOLLOW UP NOTE  Summary:  Complex Case Manager spoke with patient and verified correct patient using two identifiers today for Complex Case Management follow up. Patient currently resides at Home. Primary concern is finding a IT sales professional.      Subjective:    Patient/caregiver reported he was doing well. States he has received resources sent to him.      Objective:     Screenings Completed during visit: None completed at this visit    Barriers to care: Difficulty Accessing Healthcare    Interventions provided: Connection to External Provider: Behavior Therapist    Progress towards  goal : On Track      Plan:     1. Case Management to: Follow up in 1 weeks    2. Patient/caregiver to: notify PCP/CM of questions/concerns    Discuss at next outreach: Referral Follow-Up    Care Coordination Note updated in [facility]: Yes    This patient is currently receiving Complex Case Management services.      Primary Case Manager: [name], RN [phone]  Please contact CM for care plan changes, updates or recent discharges.    High Risk Drivers: Multiple Complex Diagnoses  Primary Disease Process: Cancer, Diabetes, and HTN  Current Residence: Home alone  Primary Medical Home: [place], [place], DO???s office    Referred to Strategic Scheduling for assistance establishing a PCP: not applicable   Current services: Oncologist  Patient's Primary Concern is/goals are: Maintain Health, Reduce food insecurity, and housing  Barriers: Difficulty Accessing Healthcare, Financial Stress, Housing Costs/Unstable Housing, and Knowledge Deficit  Strengths: Self-advocacy  Supports: Dentist: lives in recovery house  Interventions provided: Contact Information Provided, Introduction to ICM, and Medication Reconciliation,sent resources for assist with rent, sent resources to assist with tasks to reduce barriers to care(Food),care plan updated, sent resources to assist with establishing care with Dentist and Optometrist and IT sales professional, Care plan updated   Follow up with ICM Team Member: 1 week     Future Appointments   Date Time Provider Department Center   09/05/2023 12:00 PM IC CT RM 1 [facility] [place] - [facility]   09/05/2023  1:45 PM ADULT ONC LAB [facility]   09/05/2023  2:40 PM Sheth, Siddharth Hemant, DO [facility]   09/26/2023 10:00 AM [name] LABOR, MD [facility]   10/10/2023 10:30 AM Maryhelen Therisa Reusing, CPP [facility]       Problem List             Diagnosed      Abnormal computed tomography angiography (CTA)       Thyroid cancer       Obesity       Hernia       Diabetes       High blood pressure       Inflammation of sweat glands       Underactive thyroid        Allergies:   Allergies[1]    Medications:  Prior to Admission medications   Medication Dose, Route, Frequency   alcohol  swabs  (ALCOHOL  PREP PADS) PadM Use three times a day as needed.   amlodipine  (NORVASC ) 10 MG tablet 10 mg, Oral, Daily (standard)   atorvastatin  (LIPITOR) 80 MG tablet 80 mg, Oral, Nightly   BD LUER-LOK SYRINGE 3 mL 23 gauge x 1 1/2 Syrg    bimekizumab -bkzx 160 mg/mL AtIn Inject the contents of 2 pens (320mg ) every 2 weeks for the  first 16 weeks followed by every 4 weeks.   bimekizumab -bkzx 160 mg/mL AtIn Inject the contents of 2 pens (320mg ) subcutaneous every 4 weeks   BIMZELX  AUTOINJECTOR 320 mg/2 mL AtIn 320 mg   blood sugar diagnostic (GLUCOSE BLOOD) Strp Test blood glucose once daily.   blood-glucose meter kit Disp. blood glucose meter kit preferred by patient's insurance. Check blood sugars as directed by provider. Dx: Diabetes, E11.9   carvedilol  (COREG ) 6.25 MG tablet 6.25 mg, Oral, 2 times a day (standard)   cetirizine  (ZYRTEC ) 10 MG tablet 10 mg, Daily (standard)   clindamycin  (CLEOCIN  T) 1 % lotion Topical, 2 times a day (standard)   clindamycin  (CLEOCIN ) 300 MG capsule 300 mg, Oral, 2 times a day   DEXCOM G7 RECEIVER Misc Use as directed.   DEXCOM G7 SENSOR Devi Apply Dexcom G7 sensor every 10 days.   empty container Misc Use as directed to dispose of Humira  pens.   FLUoxetine  (PROZAC ) 10 MG capsule 10 mg, Oral, Daily (standard)   gabapentin  (NEURONTIN ) 300 MG capsule 300 mg, Oral, 2 times a day   hydrOXYzine  (ATARAX ) 25 MG tablet 25 mg, Oral, Every 8 hours PRN   ibuprofen  (MOTRIN ) 600 MG tablet 600 mg, Oral, 2 times a day   insulin  glargine (BASAGLAR , LANTUS ) 100 unit/mL (3 mL) injection pen 24 Units, Subcutaneous, Nightly   lancets Misc Test blood glucose once daily.   larotrectinib  (VITRAKVI ) 100 mg capsule 100 mg, Oral, 2 times a day (standard)   levothyroxine  (SYNTHROID ) 300 MCG tablet 300 mcg, Oral, Daily (standard)   losartan -hydroCHLOROthiazide  (HYZAAR ) 100-25 mg per tablet 1 tablet, Oral, Daily (standard)   metFORMIN  (GLUCOPHAGE -XR) 500 MG 24 hr tablet 1,000 mg, Oral, Daily before breakfast   naloxone (NARCAN) 0.4 mg/mL injection 0.4 mg  Patient not taking: Reported on 07/26/2023   nicotine  (NICODERM CQ ) 21 mg/24 hr patch 1 patch, Transdermal, Every 24 hours, Remove old patch before applying new one.   nicotine  polacrilex (NICORETTE) 4 MG gum Chew 1 piece (4 mg total) and park in cheek every hour as needed for smoking cessation. Max is up to 24 pieces/day.   OLANZapine  (ZYPREXA ) 5 MG tablet 5 mg, Oral, Nightly   ondansetron (ZOFRAN-ODT) 4 MG disintegrating tablet    pen needle, diabetic (PEN NEEDLE) 31 gauge x 5/16 (8 mm) Ndle Injection Frequency is 1 time per day   pen needle, diabetic (TRUEPLUS PEN NEEDLE) 32 gauge x 5/32 (4 mm) Ndle Use with insulin  glargine once daily   pioglitazone  (ACTOS ) 30 MG tablet 30 mg, Oral, Daily (standard)   polyethylene glycol (GLYCOLAX ) 17 gram/dose powder 17 g, Daily PRN   spironolactone  (ALDACTONE ) 25 MG tablet 25 mg, Oral, Daily (standard)   traZODone  (DESYREL ) 50 MG tablet 50 mg, Oral, Nightly   triamcinolone  (KENALOG ) 0.1 % ointment Topical, 2 times a day (standard), Apply twice a day to affected areas of the skin until the skin feels smooth, then stop.          [name], RN Care Manager  09/03/2023        [1]   Allergies  Allergen Reactions    Penicillin Anaphylaxis    Fish Derived     Lisinopril     Peanut Butter Flavor      Per allergy testing    Penicillins Other (See Comments)     Per patient, when he took at age 36 his legs and arms shook. Not sure if it was seizure or not.  Shellfish Containing Products     Soy

## 2023-09-05 ENCOUNTER — Inpatient Hospital Stay: Admit: 2023-09-05 | Discharge: 2023-09-05 | Payer: Medicaid (Managed Care)

## 2023-09-05 ENCOUNTER — Other Ambulatory Visit: Admit: 2023-09-05 | Discharge: 2023-09-05 | Payer: Medicaid (Managed Care)

## 2023-09-05 ENCOUNTER — Ambulatory Visit
Admit: 2023-09-05 | Discharge: 2023-09-05 | Payer: Medicaid (Managed Care) | Attending: Student in an Organized Health Care Education/Training Program | Primary: Student in an Organized Health Care Education/Training Program

## 2023-09-05 DIAGNOSIS — L732 Hidradenitis suppurativa: Principal | ICD-10-CM

## 2023-09-05 DIAGNOSIS — C73 Malignant neoplasm of thyroid gland: Principal | ICD-10-CM

## 2023-09-05 LAB — CBC W/ AUTO DIFF
BASOPHILS ABSOLUTE COUNT: 0 10*9/L (ref 0.0–0.1)
BASOPHILS RELATIVE PERCENT: 0.6 %
EOSINOPHILS ABSOLUTE COUNT: 0.2 10*9/L (ref 0.0–0.5)
EOSINOPHILS RELATIVE PERCENT: 3 %
HEMATOCRIT: 36.9 % — ABNORMAL LOW (ref 39.0–48.0)
HEMOGLOBIN: 11.9 g/dL — ABNORMAL LOW (ref 12.9–16.5)
LYMPHOCYTES ABSOLUTE COUNT: 1.4 10*9/L (ref 1.1–3.6)
LYMPHOCYTES RELATIVE PERCENT: 20.9 %
MEAN CORPUSCULAR HEMOGLOBIN CONC: 32.1 g/dL (ref 32.0–36.0)
MEAN CORPUSCULAR HEMOGLOBIN: 23.2 pg — ABNORMAL LOW (ref 25.9–32.4)
MEAN CORPUSCULAR VOLUME: 72.2 fL — ABNORMAL LOW (ref 77.6–95.7)
MEAN PLATELET VOLUME: 9.1 fL (ref 6.8–10.7)
MONOCYTES ABSOLUTE COUNT: 0.5 10*9/L (ref 0.3–0.8)
MONOCYTES RELATIVE PERCENT: 7.6 %
NEUTROPHILS ABSOLUTE COUNT: 4.4 10*9/L (ref 1.8–7.8)
NEUTROPHILS RELATIVE PERCENT: 67.9 %
PLATELET COUNT: 244 10*9/L (ref 150–450)
RED BLOOD CELL COUNT: 5.12 10*12/L (ref 4.26–5.60)
RED CELL DISTRIBUTION WIDTH: 14.3 % (ref 12.2–15.2)
WBC ADJUSTED: 6.5 10*9/L (ref 3.6–11.2)

## 2023-09-05 LAB — COMPREHENSIVE METABOLIC PANEL
ALBUMIN: 3.7 g/dL (ref 3.4–5.0)
ALKALINE PHOSPHATASE: 122 U/L — ABNORMAL HIGH (ref 46–116)
ALT (SGPT): 10 U/L (ref 10–49)
ANION GAP: 4 mmol/L — ABNORMAL LOW (ref 5–14)
AST (SGOT): 17 U/L (ref <=34)
BILIRUBIN TOTAL: 0.2 mg/dL — ABNORMAL LOW (ref 0.3–1.2)
BLOOD UREA NITROGEN: 11 mg/dL (ref 9–23)
BUN / CREAT RATIO: 11
CALCIUM: 9.9 mg/dL (ref 8.7–10.4)
CHLORIDE: 103 mmol/L (ref 98–107)
CO2: 30 mmol/L (ref 20.0–31.0)
CREATININE: 1.02 mg/dL (ref 0.73–1.18)
EGFR CKD-EPI (2021) MALE: 87 mL/min/1.73m2 (ref >=60)
GLUCOSE RANDOM: 255 mg/dL — ABNORMAL HIGH (ref 70–179)
POTASSIUM: 3.8 mmol/L (ref 3.4–4.8)
PROTEIN TOTAL: 7.9 g/dL (ref 5.7–8.2)
SODIUM: 137 mmol/L (ref 135–145)

## 2023-09-05 LAB — SLIDE REVIEW

## 2023-09-05 LAB — T4, FREE: FREE T4: 1.39 ng/dL (ref 0.89–1.76)

## 2023-09-05 LAB — TSH: THYROID STIMULATING HORMONE: 0.094 u[IU]/mL — ABNORMAL LOW (ref 0.550–4.780)

## 2023-09-05 MED ORDER — TRIAMCINOLONE ACETONIDE 0.1 % TOPICAL OINTMENT
Freq: Two times a day (BID) | TOPICAL | 0 refills | 0.00000 days | Status: CP
Start: 2023-09-05 — End: 2024-09-04
  Filled 2023-09-13: qty 80, 30d supply, fill #0

## 2023-09-05 MED ORDER — HYDROXYZINE HCL 25 MG TABLET
ORAL_TABLET | Freq: Three times a day (TID) | ORAL | 0 refills | 10.00000 days | Status: CP | PRN
Start: 2023-09-05 — End: ?
  Filled 2023-09-13: qty 30, 10d supply, fill #0

## 2023-09-05 MED ORDER — RIFAMPIN 300 MG CAPSULE
ORAL_CAPSULE | Freq: Every day | ORAL | 1 refills | 30.00000 days | Status: CP
Start: 2023-09-05 — End: 2023-10-25
  Filled 2023-09-13: qty 30, 30d supply, fill #0

## 2023-09-05 MED ORDER — CETIRIZINE 10 MG TABLET
ORAL_TABLET | Freq: Every day | ORAL | 1 refills | 90.00000 days | Status: CP
Start: 2023-09-05 — End: ?
  Filled 2023-09-13: qty 30, 30d supply, fill #0

## 2023-09-05 MED ORDER — CLINDAMYCIN HCL 300 MG CAPSULE
ORAL_CAPSULE | Freq: Two times a day (BID) | ORAL | 0 refills | 30.00000 days | Status: CP
Start: 2023-09-05 — End: ?
  Filled 2023-09-13: qty 60, 30d supply, fill #0

## 2023-09-05 MED ADMIN — iohexol (OMNIPAQUE) 350 mg iodine/mL solution 75 mL: 75 mL | INTRAVENOUS | @ 16:00:00 | Stop: 2023-09-05

## 2023-09-05 NOTE — Unmapped (Unsigned)
 [facility]  PCP: Paul Hines LABOR, MD    Consulting providers:  Otolaryngology: Dr. 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 man with history of poorly controlled T2DM, hypertension, ischemic cardiac disease s/p catheterization in 2021 and metastatic thyroid cancer, s/p resection with Dr. Jessee on 04/12/20 and neck dissection on 07/28/20. Noted to have metastatic disease with pulmonary involvement on PET/CT (01/14/2022). Treated with 1L Lenvima  18 mg daily (02/21/21-12/2022), discontinued due to TRAE and poor tolerance. Given TPR-NTRK1 chromosomal rearrangement, treating with 2L Larotrectinib . Presents for follow up.     # Metastatic Papillary Thyroid Cancer, BRAF WT  - Relevant oncologic history summarized above; treatment goals are palliative.   - Tg: 75 (02/17/2020)  --> 134 (08/08/2022) --> 46 (10/04/2022) --> 39 (11/03/2022) --> 45 (12/20/2022) --> 83 (02/14/2023) --> 28 (07/26/2023) --> 31 (09/05/2023)  - Tempus xT: TPR-NTRK1 chromosomal rearrangement  - CT Neck/Chest (09/05/2023): personally reviewed; continued treatment response  - Continue Larotrectinib ; excellent tolerance  - RTC in 6 weeks for labs    # Hidradenitis suppurativa  - Followed by [facility]  - Continue bimekizumab  injections  - Continue clindamycin  300 mg BID + rifampin  (refilled today)    # Hypertension, stable  - Continue current amlodipine , coreg , hyzaar   - Encouraged medical compliance and frequent home checks  - Managed by his PCP    # Diabetes, poorly controlled  - A1c 11.4% (06/20/2023)  - Continue metformin  + Lantus    - Discussed importance of compliance   - Follow up PCP    # Hypothyroidism  - Goal TSH <0.1  - TSH 0.094, FT4 1.4 today (09/05/2023)  - Continue synthroid  300 mcg once daily    # Supportive care  - Cancer-related pain: Continue on Gabapentin  300mg  BID  - Psychosocial: denies any need for CCSP at this time.   - Constipation: Recommended Miralax      Follow up: 3 months    Paul Hines   Head and Neck Medical Oncology  [employer]     --------------------------  Interval History  History of Present Illness  Paul Hines is a 53 year old male with metastatic thyroid cancer who presents for ontreatment scans.    - Overall states that he is doing OK  - Admits to some fatigue  - Complaint with oral chemo and synthroid ; not missing doses  - Has not been checking blood pressures; denies headaches or dizziness  - Has not been checking blood sugars  - HS is adequately controlled but continues to flair up  - Denies fevers, chills, CP, SOB, abdominal pain, vomiting  - Admits to occasional nausea    History of Present Illness:  Paul Hines is a 53 y.o. male with history of T2DM, hypertension, ischemic heart disease s/p cardiac catheterization (07/2019) who presents for evaluation of head and neck cancer. I have reviewed his records including history, imaging, pathology reports, and, when applicable, operative notes and summarized his oncologic history in my original consult note.    Past Medical History:   Diagnosis Date    Abnormal findings on dx imaging of heart and cor circ     Acne vulgaris     Allergic contact dermatitis due to food in contact with skin     Atherosclerosis     Athscl heart disease of native coronary artery  w/o ang pctrs     Chronic ischemic heart disease     Cutaneous abscess     Diabetic polyneuropathy        Disorder of skin and subcutaneous tissue     Essential hypertension     Financial difficulties     H/O medication noncompliance     Hernia, inguinal, unilateral     Hypertrophic cardiomyopathy        Malignant neoplasm of thyroid gland        Presbyopia     Problem with transportation     Pseudofolliculitis barbae     Shoulder pain, bilateral     Tinea unguium     Type 2 diabetes mellitus        Visual impairment     Xerosis cutis        Past Surgical History:   Procedure Laterality Date    PR BRNCHSC EBUS GUIDED SAMPL 1/2 NODE STATION/STRUX N/A 01/09/2020    Procedure: BRONCH, RIGID OR FLEXIBLE, INC FLUORO GUIDANCE, WHEN PERFORMED; WITH EBUS GUIDED TRANSTRACHEAL AND/OR TRANSBRONCHIAL SAMPLING, ONE OR TWO MEDIASTINAL AND/OR HILAR LYMPH NODE STATIONS OR STRUCTURES;  Surgeon: Paul Janina Sermon, MD;  Location: MAIN OR [place];  Service: Pulmonary    PR BRNSCHSC TNDSC EBUS DX/TX INTERVENTION PERPH LES Right 01/09/2020    Procedure: BRONCH, RIGID OR FLEXIBLE, INCLUDING FLUORO GUIDANCE, WHEN PERFORMED; WITH TRANSENDOSCOPIC EBUS DURING BRONCHOSCOPIC DIAGNOSTIC OR THERAPEUTIC INTERVENTION(S) FOR PERIPHERAL LESION(S);  Surgeon: Paul Janina Sermon, MD;  Location: MAIN OR [place];  Service: Pulmonary    PR BRONCHOSCOPY,COMPUTER ASSIST/IMAGE-GUIDED NAVIGATION Right 01/09/2020    Procedure: ROBOT ION BRONCHOSCOPY,RIGID OR FLEXIBLE,INCLUDE FLUORO WHEN PERFORMED; W/COMPUTER-ASSIST,IMAGE-GUIDED NAVIGATION;  Surgeon: Paul Janina Sermon, MD;  Location: MAIN OR [place];  Service: Pulmonary    PR BRONCHOSCOPY,DIAGNOSTIC W LAVAGE Right 01/09/2020    Procedure: BRONCHOSCOPY, RIGID OR FLEXIBLE, INCLUDE FLUOROSCOPIC GUIDANCE WHEN PERFORMED; W/BRONCHIAL ALVEOLAR LAVAGE;  Surgeon: Paul Janina Sermon, MD;  Location: MAIN OR [place];  Service: Pulmonary    PR BRONCHOSCOPY,TRANSBRON ASPIR BX Right 01/09/2020    Procedure: BRONCHOSCOPY, RIGID/FLEX, INCL FLUORO; W/TRANSBRONCH NDL ASPIRAT BX, TRACHEA, MAIN STEM &/OR LOBAR BRONCHUS;  Surgeon: Paul Janina Sermon, MD;  Location: MAIN OR [place];  Service: Pulmonary    PR BRONCHOSCOPY,TRANSBRONCH BIOPSY Right 01/09/2020    Procedure: BRONCHOSCOPY, RIGID/FLEXIBLE, INCLUDE FLUORO GUIDANCE WHEN PERFORMED; W/TRANSBRONCHIAL LUNG BX, SINGLE LOBE;  Surgeon: Paul Janina Sermon, MD;  Location: MAIN OR [place];  Service: Pulmonary    PR CATH PLACE/CORON ANGIO, IMG SUPER/INTERP,W LEFT HEART VENTRICULOGRAPHY N/A 08/26/2019    Procedure: Left Heart Catheterization;  Surgeon: Paul Glean Ship, MD;  Location: [facility] CATH;  Service: Cardiology    PR LIGATN THOR DUCT,CERV APPROACH Bilateral 07/28/2020    Procedure: SUTURE &/OR LIG THORACIC DUCT; CERV APPROACH;  Surgeon: Frederic Marcey Lou, MD;  Location: MAIN OR [facility];  Service: ENT    PR REMOVAL NODES, NECK,CERV MOD RAD Bilateral 07/28/2020    Procedure: CERVICAL LYMPHADENECTOMY (MODIFIED RADICAL NECK DISSECTION);  Surgeon: Frederic Marcey Lou, MD;  Location: MAIN OR [facility];  Service: ENT    PR THYROIDECTOMY,MALIG,LTD NECK SURG Bilateral 04/12/2020    Procedure: THYROIDECTOMY, TOTAL OR SUBTOTAL FOR MALIGNANCY; WITH LIMITED NECK DISSECTION;  Surgeon: Frederic Marcey Lou, MD;  Location: [facility]IN OR [facility];  Service: ENT       Current Outpatient Medications   Medication Sig Dispense Refill    amlodipine  (NORVASC ) 10 MG tablet Take 1 tablet (10 mg total) by mouth daily. 30 tablet 11    atorvastatin  (LIPITOR) 80 MG  tablet Take 1 tablet (80 mg total) by mouth nightly. 30 tablet 11    BIMZELX  AUTOINJECTOR 320 mg/2 mL AtIn Inject 320 mg under the skin.      carvedilol  (COREG ) 6.25 MG tablet Take 1 tablet (6.25 mg total) by mouth two (2) times a day. 60 tablet 5    clindamycin  (CLEOCIN  T) 1 % lotion Apply topically two (2) times a day. 60 mL 11    clindamycin  (CLEOCIN ) 300 MG capsule Take 1 capsule (300 mg total) by mouth two (2) times a day. 60 capsule 0    FLUoxetine  (PROZAC ) 10 MG capsule Take 1 capsule (10 mg total) by mouth daily. 30 capsule 2    gabapentin  (NEURONTIN ) 300 MG capsule Take 1 capsule (300 mg total) by mouth two (2) times a day. 180 capsule 2    hydrOXYzine  (ATARAX ) 25 MG tablet Take 1 tablet (25 mg total) by mouth every eight (8) hours as needed for itching. 30 tablet 0    ibuprofen  (MOTRIN ) 600 MG tablet Take 1 tablet (600 mg total) by mouth two (2) times a day. 60 tablet 1    insulin  glargine (BASAGLAR , LANTUS ) 100 unit/mL (3 mL) injection pen Inject 0.24 mL (24 Units total) under the skin nightly. 15 mL 2    larotrectinib  (VITRAKVI ) 100 mg capsule Take 1 capsule (100 mg total) by mouth two (2) times a day . 60 capsule 5    levothyroxine  (SYNTHROID ) 300 MCG tablet Take 1 tablet (300 mcg total) by mouth daily. 30 tablet 11    losartan -hydroCHLOROthiazide  (HYZAAR ) 100-25 mg per tablet Take 1 tablet by mouth daily. 30 tablet 11    metFORMIN  (GLUCOPHAGE -XR) 500 MG 24 hr tablet Take 2 tablets (1,000 mg total) by mouth daily before breakfast. 60 tablet 2    nicotine  (NICODERM CQ ) 21 mg/24 hr patch Place 1 patch on the skin daily. Remove old patch before applying new one. 28 patch 2    nicotine  polacrilex (NICORETTE) 4 MG gum Chew 1 piece (4 mg total) and park in cheek every hour as needed for smoking cessation. Max is up to 24 pieces/day. 110 each 2    pioglitazone  (ACTOS ) 30 MG tablet Take 1 tablet (30 mg total) by mouth daily. 30 tablet 5    rifAMPin  (RIFADIN ) 300 MG capsule Take 1 capsule (300 mg total) by mouth.      spironolactone  (ALDACTONE ) 25 MG tablet Take 1 tablet (25 mg total) by mouth daily. 30 tablet 11    traZODone  (DESYREL ) 50 MG tablet Take 1 tablet (50 mg total) by mouth nightly. 30 tablet 2    triamcinolone  (KENALOG ) 0.1 % ointment Apply topically two (2) times a day. Apply twice a day to affected areas of the skin until the skin feels smooth, then stop. 80 g 0    alcohol  swabs  (ALCOHOL  PREP PADS) PadM Use three times a day as needed. 100 each 0    BD LUER-LOK SYRINGE 3 mL 23 gauge x 1 1/2 Syrg       bimekizumab -bkzx 160 mg/mL AtIn Inject the contents of 2 pens (320mg ) every 2 weeks for the first 16 weeks followed by every 4 weeks. 4 mL 4    bimekizumab -bkzx 160 mg/mL AtIn Inject the contents of 2 pens (320mg ) subcutaneous every 4 weeks 2 mL 11    blood sugar diagnostic (GLUCOSE BLOOD) Strp Test blood glucose once daily. 50 strip 11    blood-glucose meter kit Disp. blood glucose meter kit preferred by patient's insurance. Check  blood sugars as directed by provider. Dx: Diabetes, E11.9 1 each 11 cetirizine  (ZYRTEC ) 10 MG tablet Take 1 tablet (10 mg total) by mouth daily. (Patient not taking: Reported on 09/05/2023)      DEXCOM G7 RECEIVER Misc Use as directed. 1 each 0    DEXCOM G7 SENSOR Devi Apply Dexcom G7 sensor every 10 days. 9 each 3    empty container Misc Use as directed to dispose of Humira  pens. 1 each 2    lancets Misc Test blood glucose once daily. 100 each 11    naloxone (NARCAN) 0.4 mg/mL injection Infuse 1 mL (0.4 mg total) into a venous catheter. (Patient not taking: Reported on 07/26/2023)      OLANZapine  (ZYPREXA ) 5 MG tablet Take 1 tablet (5 mg total) by mouth nightly. 90 tablet 1    ondansetron (ZOFRAN-ODT) 4 MG disintegrating tablet  (Patient not taking: Reported on 09/05/2023)      pen needle, diabetic (PEN NEEDLE) 31 gauge x 5/16 (8 mm) Ndle Injection Frequency is 1 time per day 100 each 11    pen needle, diabetic (TRUEPLUS PEN NEEDLE) 32 gauge x 5/32 (4 mm) Ndle Use with insulin  glargine once daily 100 each 1    polyethylene glycol (GLYCOLAX ) 17 gram/dose powder Take 17 g by mouth daily as needed. (Patient not taking: Reported on 09/05/2023)       No current facility-administered medications for this visit.       Allergies   Allergen Reactions    Penicillin Anaphylaxis    Fish Derived     Lisinopril     Peanut Butter Flavor      Per allergy testing    Penicillins Other (See Comments)     Per patient, when he took at age 20 his legs and arms shook. Not sure if it was seizure or not.     Shellfish Containing Products     Soy        Social History     Tobacco Use    Smoking status: Every Day     Types: Cigarettes     Passive exposure: Current    Smokeless tobacco: Never   Vaping Use    Vaping status: Never Used   Substance Use Topics    Alcohol  use: Yes    Drug use: Not Currently     Types: Marijuana     Comment: quit in 1992       Social History     Social History Narrative    Not on file       Family History   Problem Relation Age of Onset    Melanoma Neg Hx     Basal cell carcinoma Neg Hx     Squamous cell carcinoma Neg Hx        Review of Systems: A 12-system review of systems was obtained including: Constitutional, Eyes, ENT, Cardiovascular, Respiratory, GI, GU, Musculoskeletal, Skin, Neurological, Psychiatric, Endocrine, Heme/Lymphatic, and Allergic/Immunologic systems. It is negative or non-contributory to the patient???s management except for as stated in patient's HPI    ECOG PS: 1    Physical Examination:  Vital Signs: BP 166/97  - Pulse 74  - Temp 36.6 ??C (97.8 ??F) (Temporal)  - Resp 16  - Ht 170.2 cm (5' 7)  - Wt 79.4 kg (175 lb)  - SpO2 99%  - BMI 27.41 kg/m??   CONSTITUTIONAL: Pleasant man, mild discomfort due to HS  Oral Cavity: MMM, no oral lesions or mucositis  Lymphatics: No  lymphadenopathy   CV: RRR; no lower extremity edema  RESP: normal work of breathing  GI: Soft, non-tender, non-distended  SKIN: No skin rashes; groin not examined   NEURO: no focal deficits appreciated,  PSYCH: Normal mood and appropriate affect    LABS:   Lab Results   Component Value Date    WBC 6.5 09/05/2023    HGB 11.9 (L) 09/05/2023    HCT 36.9 (L) 09/05/2023    PLT 244 09/05/2023       Lab Results   Component Value Date    NA 137 09/05/2023    K 3.8 09/05/2023    CL 103 09/05/2023    CO2 30.0 09/05/2023    BUN 11 09/05/2023    CREATININE 1.02 09/05/2023    GLU 255 (H) 09/05/2023    CALCIUM 9.9 09/05/2023    MG 1.6 07/31/2020    PHOS 3.0 07/31/2020       Lab Results   Component Value Date    BILITOT 0.2 (L) 09/05/2023    PROT 7.9 09/05/2023    ALBUMIN 3.7 09/05/2023    ALT 10 09/05/2023    AST 17 09/05/2023    ALKPHOS 122 (H) 09/05/2023       No results found for: PT, INR, APTT      IMAGING  CT Neck (09/05/2023)  No definite evidence of recurrent disease in the neck. No new lymphadenopathy.     CT Chest (09/05/2023)  1. No findings to suggest progression of intrathoracic metastatic neoplastic disease      2. No findings of infectious pneumonia or other acute pulmonary process      3. Calcified atherosclerotic disease within the coronary arterial distribution      4. Please reference the separate dedicated report associated with contemporaneous CT imaging of the neck.        PATHOLOGY  THYROID GLAND  8th Edition - Protocol posted: 03/27/2018   THYROID GLAND: RESECTION - All Specimens  Clinical History  No known radiation exposure    SPECIMEN   Procedure  Total thyroidectomy    TUMOR   Tumor Focality  Multifocal    Tumor Characteristics     Tumor Site  Right lobe      Left lobe      Isthmus    Histologic Type  Papillary carcinoma, classic (usual, conventional)    Tumor Size  Greatest Dimension (Centimeters): 3.1 cm   Extrathyroidal Extension  Not identified    Angioinvasion (vascular invasion)  Not identified    Lymphatic Invasion  Not identified    Perineural Invasion  Not identified    Margins  Uninvolved by carcinoma    Distance of Invasive Carcinoma from Closest Margin (Millimeters)  1 mm   Mitotic Rate  4 Mitoses per 2 mm^2   LYMPH NODES   Number of Lymph Nodes Involved  14    Nodal Levels Involved  Level VI    Size of Largest Metastatic Deposit (Centimeters)  5 cm   Extranodal Extension (ENE)  Present    Number of Lymph Nodes Examined  14    Nodal Levels Examined  Level VI    PATHOLOGIC STAGE CLASSIFICATION (pTNM, AJCC 8th Edition)   TNM Descriptors  m (multiple primary tumors)         Primary Tumor (pT)  pT2    Regional Lymph Nodes (pN)  pN1a    Distant Metastasis (pM)  pM1    Site(s)  lung see [id number]  Tempus xT       I have personally reviewed relevant imaging, laboratory values, existing medical records, and pathology. I have summarized these findings in the oncology history above. Nodal Levels Involved  Level VI    Size of Largest Metastatic Deposit (Centimeters)  5 cm   Extranodal Extension (ENE)  Present    Number of Lymph Nodes Examined  14    Nodal Levels Examined  Level VI    PATHOLOGIC STAGE CLASSIFICATION (pTNM, AJCC 8th Edition)   TNM Descriptors  m (multiple primary tumors)         Primary Tumor (pT)  pT2    Regional Lymph Nodes (pN)  pN1a    Distant Metastasis (pM)  pM1    Site(s)  lung see [id number]      Tempus xT       I have personally reviewed relevant imaging, laboratory values, existing medical records, and pathology. I have summarized these findings in the oncology history above.

## 2023-09-05 NOTE — Unmapped (Signed)
 [facility] Refill Coordination Note    Specialty Medication(s) to be Shipped:   Hematology/Oncology: Vitrakvi  100 mg    Other medication(s) to be shipped: ibuprofen   Nicotine  patch  Nicotine  gum     Paul Hines, DOB: 1971/01/18  Phone: [phone] (home)       All above HIPAA information was verified with patient.     Was a Nurse, learning disability used for this call? No    Completed refill call assessment today to schedule patient's medication shipment from the [facility] and Home Delivery Pharmacy  [phone]).  All relevant notes have been reviewed.     Specialty medication(s) and dose(s) confirmed: Regimen is correct and unchanged.   Changes to medications: [name] reports no changes at this time.  Changes to insurance: No  New side effects reported not previously addressed with a pharmacist or physician: None reported  Questions for the pharmacist: No    Confirmed patient received a Conservation officer, historic buildings and a Surveyor, mining with first shipment. The patient will receive a drug information handout for each medication shipped and additional FDA Medication Guides as required.       DISEASE/MEDICATION-SPECIFIC INFORMATION        N/A    SPECIALTY MEDICATION ADHERENCE     Medication Adherence    Patient reported X missed doses in the last month: 0  Specialty Medication: Vitrakvi  100 mg  Patient is on additional specialty medications: No              Were doses missed due to medication being on hold? No    Vitrakvi  100 mg  : 4 days of medicine on hand       REFERRAL TO PHARMACIST     Referral to the pharmacist: Not needed      [facility]     Shipping address confirmed in Epic.     Cost and Payment: Patient has a copay of $8. They are aware and have authorized the pharmacy to charge the credit card on file. -medicaid, unable to afford, patient requests we bill him    Delivery Scheduled: Yes, Expected medication delivery date: 8/8.     Medication will be delivered via Next Day Courier to the prescription address in Epic [place].    Paul Hines   [facility]  Specialty Technician

## 2023-09-06 MED FILL — VITRAKVI 100 MG CAPSULE: ORAL | 30 days supply | Qty: 60 | Fill #2

## 2023-09-06 MED FILL — NICOTINE (POLACRILEX) 4 MG GUM: BUCCAL | 5 days supply | Qty: 110 | Fill #1

## 2023-09-06 MED FILL — IBUPROFEN 600 MG TABLET: ORAL | 30 days supply | Qty: 60 | Fill #1

## 2023-09-06 MED FILL — NICOTINE 21 MG/24 HR DAILY TRANSDERMAL PATCH: TRANSDERMAL | 28 days supply | Qty: 28 | Fill #1

## 2023-09-07 NOTE — Unmapped (Signed)
 Form faxed to [facility]

## 2023-09-07 NOTE — Unmapped (Signed)
 Completed and placed in team 2 action box

## 2023-09-08 LAB — REFLEX - THYROGLOBULIN, TUMOR MARKER BY IMMUNOASSAY: THYROGLOBULIN, TUMOR MARKER, IA: 31 ng/mL

## 2023-09-08 LAB — THYROGLOBULIN PANEL: THYROGLOBULIN AB: <1.8 [IU]/mL

## 2023-09-10 NOTE — Unmapped (Signed)
 COMPLEX CASE MANAGEMENT   Brief Follow up Note      Case Manager Assistant sent patient information from [employer] on Thyroid Cancer(English) via mail .    Objective:     Screenings Completed during visit: None completed at this visit    Barriers to care: Health Literacy     Interventions provided: information from [facility] on Thyroid Cancer(English)    Progress towards  goal : On Track 09/10/23    Plan:     1. Case Management to: Follow up in 1 weeks    2. Patient/caregiver to: review resources    Discuss at next outreach:  Thyroid Cancer    Care Coordination Note updated in [facility]: Yes    [facility] Management Assistant  [facility]  [address]  [place], [place] [address]  She/Her/Hers  P: [phone] F: [phone]  [email or web address]

## 2023-09-10 NOTE — Unmapped (Signed)
Complex Case Management  SUMMARY NOTE    Attempted to contact pt today at Cell number to follow up for Complex Case Management services. Left message to return call.; 1st attempt    Discuss at next visit: Referral Follow-Up      Andrea Miller,RN - Case Manager   [facility]  [address],  [place], [place] [address]  p. [phone]   [email or web address]

## 2023-09-11 NOTE — Unmapped (Signed)
 COMPLEX CASE MANAGEMENT   Brief Note       Received call into the main line of the Complex Case Management Program.   Case Manager Assistant assisted patient with insurance ID number to schedule a dentist appointment.     [facility] Management Assistant  [facility]  [address], [address]  [place], [place] [address]  She/Her/Hers  P: [phone] F: [phone]  [email or web address]

## 2023-09-12 NOTE — Unmapped (Signed)
 COMPLEX CASE MANAGEMENT   Brief Note    Patient called CM back and CM assisted patient with Dentist contact numbers. Also confirmed upcoming appointment on 09/26/23 with patient.      Alfonso Cleotilde PEAK - Case Manager   [facility]  [address]  [place], [place] [address]  p. [phone]   [email or web address]

## 2023-09-12 NOTE — Unmapped (Signed)
 [facility] Home Delivery Pharm[facility] Refill Coordination Note    Specialty Medication(s) to be Shipped:   Inflammatory Disorders: Bimzelx     Other medication(s) to be shipped: Hyroxyzine, Rifampin , Cetirizine , Clindamycin , Triamcinolone     Specialty Medications not needed at this time: N/A     DAIVON RAYOS, DOB: 05/04/1970  Phone: [phone] (home)       All above HIPAA information was verified with patient.     Was a Nurse, learning disability used for this call? No    Completed refill call assessment today to schedule patient's medication shipment from the [facility] and Home Delivery Pharmacy  [phone]).  All relevant notes have been reviewed.     Specialty medication(s) and dose(s) confirmed: Regimen is correct and unchanged.   Changes to medications: [place]is reports no changes at this time.  Changes to insurance: No  New side effects reported not previously addressed with a pharmacist or physician: None reported  Questions for the pharmacist: No    Confirmed patient received a Conservation officer, historic buildings and a Surveyor, mining with first shipment. The patient will receive a drug information handout for each medication shipped and additional FDA Medication Guides as required.       DISEASE/MEDICATION-SPECIFIC INFORMATION        For patients on injectable medications: Patient currently has 0 doses left.  Next injection is scheduled for 2 weeks.    SPECIALTY MEDICATION ADHERENCE     Medication Adherence    Patient reported X missed doses in the last month: 0  Specialty Medication: BIMZELX  AUTOINJECTOR 160 mg/mL Atin  Patient is on additional specialty medications: Yes  Additional Specialty Medications: VITRAKVI  100 mg capsule   Patient Reported Additional Medication X Missed Doses in the Last Month: 0  Patient is on more than two specialty medications: No              Were doses missed due to medication being on hold? No    Bimzelx  160 mg/ml: 0 doses of medicine on hand        REFERRAL TO PHARMACIST Referral to the pharmacist: Not needed      SHIPPING     Shipping address confirmed in Epic.     Cost and Payment: pt unable to pay copays    Delivery Scheduled: Yes, Expected medication delivery date: 09/13/23.     Medication will be delivered via Same Day Courier to the prescription address in Epic [place].    Kelly CHRISTELLA Eagles   [facility] Delivery Pharm[facility]  Specialty Technician

## 2023-09-12 NOTE — Unmapped (Signed)
 Complex Case Management  SUMMARY NOTE    Attempted to contact pt today at Cell number to return patient's call. No answer, unable to leave message; 1st attempt    Discuss at next visit: Referral Follow-Up      Alfonso Cleotilde PEAK - Case Manager   [facility] Clinical Services  [address]  [place], [place] [address]  p. [phone]   [email or web address]

## 2023-09-13 MED FILL — TRUEPLUS PEN NEEDLE 31 GAUGE X 5/16" (8 MM): ORAL | 90 days supply | Qty: 100 | Fill #2

## 2023-09-13 MED FILL — BIMZELX AUTOINJECTOR 160 MG/ML SUBCUTANEOUS AUTO-INJECTOR: SUBCUTANEOUS | 28 days supply | Qty: 4 | Fill #1

## 2023-09-18 NOTE — Unmapped (Signed)
Complex Case Management  SUMMARY NOTE    Attempted to contact pt today at Cell number to follow up for Complex Case Management services. No answer, unable to leave message; 1st attempt    Discuss at next visit: Referral Follow-Up      Yetzali Weld,RN - Case Manager   [facility] Clinical Services  [address],  [place], [place] [address]  p. [phone]   [email or web address]

## 2023-09-23 MED ORDER — INSULIN LISPRO (U-100) 100 UNIT/ML SUBCUTANEOUS PEN
Freq: Three times a day (TID) | SUBCUTANEOUS | 0 refills | 84.00000 days
Start: 2023-09-23 — End: ?

## 2023-09-23 MED ORDER — AMLODIPINE 10 MG TABLET
ORAL_TABLET | Freq: Every day | ORAL | 0 refills | 30.00000 days
Start: 2023-09-23 — End: ?

## 2023-09-23 MED ORDER — INSULIN GLARGINE (U-100) 100 UNIT/ML (3 ML) SUBCUTANEOUS PEN
Freq: Every evening | SUBCUTANEOUS | 0 refills | 75.00000 days
Start: 2023-09-23 — End: ?

## 2023-09-23 MED ORDER — GABAPENTIN 300 MG CAPSULE
ORAL_CAPSULE | Freq: Two times a day (BID) | ORAL | 0 refills | 30.00000 days
Start: 2023-09-23 — End: ?

## 2023-09-24 DIAGNOSIS — E119 Type 2 diabetes mellitus without complications: Principal | ICD-10-CM

## 2023-09-24 DIAGNOSIS — R739 Hyperglycemia, unspecified: Principal | ICD-10-CM

## 2023-09-24 DIAGNOSIS — Z794 Long term (current) use of insulin: Principal | ICD-10-CM

## 2023-09-24 NOTE — Unmapped (Signed)
 Chart opened in error

## 2023-09-24 NOTE — Unmapped (Signed)
Complex Case Management  SUMMARY NOTE    Attempted to contact pt today at Cell number to complete TCM . No answer, unable to leave message; 1st attempt    Discuss at next visit: Complete TCM and Referral Follow-Up      Adrian Prince - Case Manager   [facility] Clinical Services  [address]  [place], [place] [address]  p. [phone]   [email or web address]

## 2023-09-24 NOTE — Unmapped (Addendum)
 COMPLEX CASE MANAGEMENT   Brief Follow up Note      Case Manager Assistant sent patient information on Medicaid benefits and nearest disater shelter via mail.    Objective:     Screenings Completed during visit: None completed at this visit    Barriers to care: Health Literacy and Homelessness     Interventions provided:  Medicaid benefits and nearest disater shelter via mail     Progress towards  goal : On Track 09/24/23    Plan:     1. Case Management to: Follow up in 1 weeks    2. Patient/caregiver to: review resources    Discuss at next outreach: [employer] benefits and nearest Training and development officer     Care Coordination Note updated in [facility]: Yes    Therapist, art  [facility]  [address], [address]  [place], [place] [address]  She/Her/Hers  P: [phone] F: [phone]  [email or web address]

## 2023-09-25 NOTE — Unmapped (Signed)
Complex Case Management  SUMMARY NOTE    Attempted to contact pt today at Cell number to resolve Complex Case Management services. Left message to return call.; 1st attempt    Discuss at next visit: Graduation from Complex Case Management      Jase Himmelberger,RN - Case Manager   [facility]  [address],  [place], [place] [address]  p. [phone]   [email or web address]

## 2023-09-26 NOTE — Unmapped (Unsigned)
 [facility]  [address] [place], [place] [address]   Telephone [phone]  Fax [phone]    [name] is a 53 y.o. male that presents to clinic today regarding the following issues:    No chief complaint on file.      Assessment & Plan      Distressed about housing issues  Pt with complex medical history recently complicated by several months of unstable housing.  He is currently living at [facility] in [place]. However, he is unable to pay rent this month and will likely be evicted later this week.  He does have case management-Paul Hines-who is following him.  He is enrolled in [facility] which offers more support.      Given uncertainty with housing, medical management will remain challenging. He has a call with Paul Hines later today (1300). I will plan to reach out to her with an update from today's visit as well.       Will plan to have close follow up and help establish with a new PCP.      Type 2 diabetes mellitus with hyperglycemia, with long-term current use of insulin     Poorly controlled diabetes.  A1c was improving in January but given housing and medication issues, is now back up to 15.1. He is taking lantus  20-25u intermittently with intermittent short acting (1-3u). He has not been checking his blood glucose due to pain with finger sticks      Due to thyroid cancer he is not a good candiate for GLP1. Will consider adding SGLT2 in the future, however, due to severe HA with several ongoing groin lesions with planned unroofing will hold off for now. Once HA undercontrol will consider adding.      Given more pressuring housing concerns with uncertainty on medications, we deferred changes today. Will have him see PharmD next week (instructed to bring pill box and all meds to this apt). Plan to follow up with me or Paul Hines in 2 weeks.         Previous Medication regimen - unclear if still current; :    --Metformin  XR 1000mg  to 1500mg  daily -- taking 2 in the morning but none at night  --Lantus  30u daily per pharmD on 02/01/2023 -- per patient varies between 20-25u. Has not been taking consistently;    --Atorvastatin  80 mg daily -  --amlodipine  10mg    --losartan - hydrochlorothiazide  100/25     Medication changes today:   -None; will plan to adjust once he brings medications in/full assess what he has and needs.   -Will have him continue lantus  25u daily.  -Only take short acting if able to check blood sugar to avoid hypoglycemia         DMII prev med:   --Foot exam - 10/19/2022 UTD- deficits on big toes bilaterally. No major callous or ulcers. Skin intact  --Eyes- DR screening referral placed on 10/19/2022  --labs -   --A1C  06/08/2023  15.1; (9.1  Mar 14 2023;  12/21/2022 13.6.;  A1C >14 on 10/19/2022;)   --Microalb/Cr (10/19/2022): 33.2; on ARB  --BP (goal <130/80)-  above goal, see below for discussion  --ASCVD risk (q89yrs if well controlled): elevated. On statin, unsure compliance         Essential hypertension  Patient with long standing HTN with elevated BP today. No symptoms.  Unclear if he is taking all of his medication as  ordered.  Again, we had a good discussion regarding adherence to medication regimen and updated med list printed out for patient today.        I am still hesitant to add clonidine or hydralazine given his poor compliance and the chance of rebound hypertension.  But these are options once we get him on a consistent regimen and he still hypertensive.       Current Regimen prescribed:   --amlodipine  10mg  -  --losartan - hydrochlorothiazide  100/25  --spironolactone  25mg  daily    --Carvedilol  6.5mg  BID   - there has been confusion if he is taking this or not, today he had several pill bottles with carvedilol  and says he is taking this.  At this point with his blood pressure, not unreasonable to restart.        Hidradenitis suppurativa  Managed by derm with help from surgery. Seen by derm 02/20/2023.  Had housing issues and was lost to follow up for the past several months.      Has follow up at [facility] dermatology 07/17/2023. Unclear what is current regimen is. Strongly encouraged him to keep this apt.      Previous medications:  - Stopped rifampin  300 mg BID 02/20/2023 due to change in cancer tx   - Referred to Dr Paul Hines for unroofing procedure consultation (appointment 02/19/22) - infra-abdominal and inguinal crease unroofed.   - pain clinic appointment 12/30  - Currently getting home health wound care s/p hospitalization  - Continue bimekizumab  injections as instructed and clindamycin  1% cream  -Continue clindamycin  to 300 mg BID  - has follow up with Derm 05/21/2023        Thyroid cancer     patient started on Lenvima  on 02/21/21.  Last CT scans on 12/20/2022 showed SD. Lenvima  has now been on hold since 12/20/22 when he was admitted for soft tissue infection requiring debridement from which he is still recovering.      Had follow up with Dr Paul Hines on 07/25/2023. Decision to transition from Lenvima ,  to Vitrakvi  (Larotrectinib ). Continue to follow up with surg onc.   Assessment & Plan      Health Maintenance   Topic Date Due    Retinal Eye Exam  Never done    Colon Cancer Screening  Never done    COVID-19 Vaccine (7 - Moderna risk 2024-25 season) 04/18/2023    Foot Exam  10/19/2023    Urine Albumin/Creatinine Ratio  10/19/2023    Influenza Vaccine (1) 10/01/2023    Hemoglobin A1c  12/22/2023    Serum Creatinine Monitoring  09/22/2024    Potassium Monitoring  09/22/2024    DTaP/Tdap/Td Vaccines (6 - Td or Tdap) 11/02/2032    Pneumococcal Vaccine 50+  Completed    Hepatitis C Screen  Completed    Zoster Vaccines  Completed           {Time Attestation(Optional):73335}    Subjective    History of Present Illness           Objective         {Physical Exam (Optional):73334}        [facility]  [address] [place], [place] [address]   Telephone [phone]  Fax [phone]

## 2023-10-03 NOTE — Unmapped (Signed)
 The [facility] has made a second and final attempt to reach this patient to refill the following medication: Vitrakvi , Bimzelx .      We have left voicemails on the following phone numbers: [phone], have left voicemail with patient at the following phone numbers: [phone], have been unable to leave messages on the following phone numbers: [phone], have sent a MyChart message, and have sent a text message to the following phone numbers: [phone].    Dates contacted: 8/27, 9/3  Last scheduled delivery: shipped 8/7    The patient may be at risk of non-compliance with this medication. The patient should call the [facility] at [phone]  Option 4, then Option 1: Oncology to refill medication.    Paul Hines   [facility] Specialty and Home Delivery Oncologist

## 2023-10-09 NOTE — Unmapped (Unsigned)
 Subjective     Reason for visit:    Paul Hines is a 53 y.o. 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  Patient presents to this visit alone.    Known DM Complications: peripheral neuropathy    Date of Last Diabetes Related Visit: 07/19/23 with CPP, 07/27/23 with PCP    Action At Last Diabetes Related Visit:    Stop Humalog   Start pioglitazone  15 mg daily  Increase Lantus  from 20 to 24 units daily  At PCP visit, pt reports holding insulin  per oncology in s/o poor PO intake which is being treated w/ Zyprexa ; given elevated BG readings PCP recommended taking half dose of insulin  on days when he is still eating a little  Continue metformin  XR 1000 mg daily (per pt preference)  Reviewed Dexcom G7 CGM sensor application during visit and pt successfully replaced sensor during CPP visit  Restart carvedilol  6.25 mg BID  Continue amlodipine  10 mg daily  Continue losartan -HCTZ 100-25 mg daily  Continue spironolactone  25 mg daily  Assisted with refilling weekly pillbox during CPP visit and corrected multiple discrepancies  Provided pt with phone # to call [facility] ophtho for scheduling eye exam  Strongly considering transitioning to pharmacy-provided weekly pill packs but given pt recently filled several meds anticipate refills will not align until ~Aug or Sept 2025    Since Last visit / History of Present Illness:    History of Present Illness  ***    Reported HTN Regimen:  Amlodipine  10 mg daily  Losartan -HCTZ 100-25 mg daily  Spironolactone  25 mg daily  Carvedilol  6.25 mg BID    Reported DM Regimen:    Metformin  XR 1000 mg daily  Lantus  24 units daily  Pioglitazone  30 mg daily    DM medications tried in the past:   Glipizide  Insulin  NPH    Medication Adherence and Access:  Since last visit, patient reports missing doses of meds occasionally. Brought all pill bottles and weekly pillbox w/ AM/PM slots but has not been using recently as noted above.    Discrepancies with pill bottles and pillbox:  Empty bottle of losartan -HCTZ - only filled in 3 days in pillbox  Empty bottle of fluoxetine  - none in pillbox  Several meds not in pillbox at all: spironolactone , carvedilol , levothyroxine , pioglitazone   Takes gabapentin  as needed and does not want in pillbox    Still open to pill packs in the future, although just recently picked up 90-day supply of several Rx's so will not be able to refill meds for a couple of months from now.    SMBG per CGM report:  Using Dexcom G7 CGM as instructed.    For past ~2 weeks:  Average: ***  CMI: ***  Very high: ***  High: ***  In range: ***  Low: ***  Very low : ***    Hypoglycemia:    Symptoms of hypoglycemia since last visit:  no  If yes, it was treated by: n/a    DM Prevention:  Statin: Taking; high intensity (atorvastatin  80 mg)  Aspirin - unclear if indicated; Not taking    ACEI/ARB - Taking (losartan  100 mg daily - in combo pill w/ HCTZ 25 mg); Urine MA/CR Ratio - elevated urinary albumin excretion of 33.2 ug/g (last checked 10/19/22).  Last eye exam: unknown - DUE  Last foot  exam: 10/19/22   Tobacco Use: Current smoker  Immunizations:   Immunization History   Administered Date(s) Administered    COVID-19 VAC,BIVALENT,MODERNA(BLUE CAP) 11/09/2020    COVID-19 VACCINE,MRNA(MODERNA)(PF) 03/06/2019, 04/03/2019, 12/18/2019, 06/08/2020    Covid-19 Vac, (89yr+) (Comirnaty) Mrna Pfizer  10/19/2022    DTaP, Unspecified Formulation 12/13/1971, 02/09/1972, 04/26/1972, 10/03/1973    INFLUENZA VACCINE IIV3(IM)(PF)6 MOS UP 10/19/2022    Influenza Vaccine Quad(IM)6 MO-Adult(PF) 12/02/2020    Influenza Virus Vaccine, unspecified formulation 11/03/2020    Measles 12/13/1971    Mumps 03/28/1974    Pneumococcal Conjugate 20-valent 11/03/2022    Polio Virus Vaccine, Unspecified Formulation 12/13/1971, 02/09/1972, 04/26/1972, 10/03/1973    Rubella 12/13/1971    SHINGRIX-ZOSTER VACCINE (HZV),RECOMBINANT,ADJUVANTED(IM) 11/03/2022, 03/14/2023    TdaP 11/03/2022     _________________________________________________    Past Medical History: reviewed PMH in epic today    Social History:  Social History     Tobacco Use   Smoking Status Every Day    Types: Cigarettes    Passive exposure: Current   Smokeless Tobacco Never       Medications: Medications reviewed in EPIC medication station and updated today by the clinical Insurance underwriter.         Objective   Vitals:    There were no vitals filed for this visit.     Wt Readings from Last 3 Encounters:   09/05/23 79.4 kg (175 lb)   07/27/23 80.3 kg (177 lb)   07/26/23 78.2 kg (172 lb 6.4 oz)       There is no height or weight on file to calculate BMI.    The 10-year ASCVD risk score (Arnett DK, et al., 2019) is: 45.9%    Values used to calculate the score:      Age: 55 years      Clinically relevant sex: Male      Is Non-Hispanic African American: Yes      Diabetic: Yes      Tobacco smoker: Yes      Systolic Blood Pressure: 166 mmHg      Is BP treated: Yes      HDL Cholesterol: 71 mg/dL      Total Cholesterol: 261 mg/dL    Note: For patients with SBP <90 or >200, Total Cholesterol <130 or >320, HDL <20 or >100 which are outside of the allowable range, the calculator will use these upper or lower values to calculate the patient???s risk score.      Labs:   Lab Results   Component Value Date    A1C 9.1 (H) 03/14/2023    A1C >14.0 (H) 10/19/2022    A1C 11.4 (H) 06/20/2022     Per CareEverywhere: 15.1% (06/08/23)  Per [facility] Discharge note: 10.9% (09/21/2023)    Pre-Chart  Admitted to [facility] on 8/20-8/24 for progressive dysphagia and SOB (unclear etiology, improved with steroids)  -continued home antihypertensives  -A1c 10.9% ** place in chart but unsure of exact date**  -patient reports to have stopped SA insulin  (humalog  discon at last visit) and only taking LA insulin  --> endocrine consulted for discharge (on glargine 20 units daily and Lispro 6 units TID with meals, cont metformin  and unsure if pt is taking pioglitazone )    #DM  Metformin  XR 1000 mg daily  Lantus  24 units daily (20?)  Lispro 6 TID (?)  Pioglitazone  30 mg daily    Plan  -get accurate med list  -assess adherence  -increase metformin  to 1000 mg BID (preferred to be at  1000 mg prev)  -titrate insulin  based on CGM data (do we need Lispro? Should we go back to just lantus ?)  -could increase pioglitazone  to 45 mg daily    #HTN  Amlodipine  10 mg daily  Losartan -HCTZ 100-25 mg daily  Spironolactone  25 mg daily  Carvedilol  6.25 mg BID    Plan  -assess adherence  -long acting ARB? Telmisartan 80 mg + hydrochlorothiazide  25 mg daily    **Get med rec with bottles**  -combo pill: olmesartan, amlodipine , hydrochlorothiazide   **how has he been taking, from bottles or pill packs?**    Assessment/Plan:    1. Diabetes, type 2: uncontrolled per last A1c 10.9% (06/08/23 per CareEverywhere), which is increased from 9.1% in Feb 2025 and attributed to unstable housing for the past several months. Goal <7% per ADA guidelines. Per 14-day CGM report, time in range of 20% is decreased from last time and remains below goal >70% despite initiation of pioglitazone . However, pioglitazone /TZDs known to take weeks to see full effect on BG lowering and has only been taking ~2 weeks so hopeful we will see improvements in the coming weeks and will plan to titrate to 30 mg dose once pt finishes current 30-day supply of 15 mg. Again reviewed recommendation to titrate basal insulin  as well.  Continue pioglitazone  15 mg daily until 30-day supply finished, then increase to 30 mg daily - sent new Rx for 30 mg tabs today  Increase Lantus  insulin  from 20 to 24 units once daily   Continue metformin  XR 1000 mg daily  Repeat A1c due 09/08/23 - plan to check at next clinic visit  Reviewed symptoms and treatment of hypoglycemia, including need to check BG prior to and following treatment.  SMBG instructions: use Dexcom G7 CGM (including receiver) as instructed (change sensors every 10 days)  Future considerations:  Titrate metformin  to target dose of 2000 mg daily as tolerated - pt has preferred to stay at 1000 mg daily at recent visits  Titrate pioglitazone  to max of 45 mg daily as tolerated  Caution w/ SGLT2i given recent SSTI in inguinal region and uncontrolled HS affecting inguinal region (also potentially higher risk of GU infections with significantly elevated A1c)  Could still consider GLP-1 RA given non-medullary thyroid carcinomas not a contraindication and have unclear association - also potential to improve CKD/CV-related outcomes given microalbuminuria present (if SGLT2i not pursued)    2. Hypertension: uncontrolled based on today's clinic BP of 151/93 (HR 65), although does not appear pt has been taking losartan -HCTZ due to pill bottle being empty. Goal <130/80 mmHg per ADA guidelines. Encouraged pt to refill losartan -HCTZ as well as spironolactone  (only 3 pills left in bottle) and otherwise will not make any changes to med regimen today.  Continue amlodipine  10 mg daily  Continue losartan -HCTZ 100 mg daily - pt to call for refill ASAP  Continue spironolactone  25 mg daily - pt to call for refill  Continue carvedilol  6.25 mg BID  Future considerations:  Longer-acting ARB (e.g. irbesartan, telmisartan, olmesartan) and/or thiazide (e.g. chlorthalidone or indapamide) if concern for wearing off effect    3. Non-obstructive CAD - ASCVD risk reduction: last LDL elevated at 145 (10/19/22). Goal LDL <70 given DM + ASCVD risk >20% (as well as notable history of non-obstructive CAD). Appropriately on high intensity statin, though unclear adherence. May need additional non-statin LDL lowering therapies in the future but agreed to defer for now with focus on consistent adherence to current statin regimen. Also previously discussed smoking cessation and encouraged  pt to use nicotine  patch + gum more consistently and may revisit other options such as bupropion and Chantix with future follow-up.  Continue atorvastatin  80 mg daily  Continue nicotine  21 mg/24h patch + nicotine  4 mg gum PRN cravings  Future considerations:  Add PO ezetimibe if LDL remains above goal despite confirmed adherence w/ max dose statin  Trial bupropion or Chantix for smoking cessation if pt prefers alternatives to NRT - bupropion may provide comorbid benefit w/ depression    4. Medication management and care coordination:  Assisted pt will filling weekly pillbox today and encouraged pt to resume using pillbox to help keep track of med adherence rather than remembering to take directly from pill bottles. Did not have adequate supply of all meds to fill entire pillbox, so instructed pt to add the following meds to pillbox once refilled:  Spironolactone : Sunday through Wednesday  Losartan -HCTZ: Monday through Wednesday  Fluoxetine : Sunday through Saturday  Trazodone : Sunday through Tuesday (evening slot)  Given continued concerns with med adherence and management, strongly considering pursuing pill packs and agreed to investigate after today's visit. However, anticipate may still be too soon to align all Rx's since he had several 90-day Rx fills in early June. Sent all Rx's (for daily pills) to [facility] to start to coordinate setting up pill packs and previously confirmed they can deliver to pt at no additional charge.  For now, will keep [facility] specialty medication + all other meds that will not be filled in pill pack (e.g. Lantus  insulin , Dexcom CGM sensors, PRN meds, etc.) w/ [facility].  Updated and printed med action plan for patient that includes names of all meds, dosing, and indications as listed below:  Name of Medication Morning Evening What It's For   Amlodipine   10 mg tablet 1 tablet  High blood pressure   Atorvastatin   80 mg tablet  1 tablet High cholesterol  Prevent future heart attack and stroke Carvedilol   6.25 mg tablet 1 tablet 1 tablet High blood pressure   Clindamycin   300 mg capsule 1 capsule 1 capsule Skin infection   Levothyroxine   300 mcg tablet 1 tablet  Thyroid supplement   Losartan -HCTZ  100-25 mg tablet 1 tablet  High blood pressure   Lantus  insulin  Inject 24 units once daily. Diabetes   Metformin  XR  500 mg tablet 2 tablets  Diabetes   Pioglitazone   15 mg tablet 1 tablet  Diabetes  *increase to 30 mg with next refill*   Spironolactone   25 mg tablet 1 tablet  High blood pressure   Gabapentin   300 mg capsule 1 capsule 1 capsule Cancer-related pain   Fluoxetine   10 mg capsule 1 capsule  Depression   Trazodone   50 mg tablet  1 tablet Sleep and mood   Viktrakvi (larotrectinib )  100 mg capsule 1 capsule 1 capsule Thyroid cancer   Bimzelx  injection Inject 2 pens (320 mg) every 2 weeks for 16 weeks. Hidradenitis suppurativa   As Needed Medications   Hydroxyzine   25 mg tablet Take 1 tablet (25 mg) by mouth every 8 hours as needed. Itching   Ibuprofen   600 mg tablet Take 1 tablet (600 mg) by mouth twice daily as needed. Pain   Gabapentin   300 mg capsule Take 1 capsule (300 mg) by mouth twice daily. Pain   Nicotine   21 mg/24 hour patch Place 1 patch on the skin daily. Decrease cigarette cravings   Nicotine   4 mg gum Chew and park 1 piece of gum  in cheek every 1 hour as needed. Cigarette cravings       Follow-up: ~1 month with PCP and ~2-3 weeks after that with CPP  Note: prefers to continue seeing Dr. Albertina as PCP but no available slots for the next several months so will keep appt w/ Dr. Marland in August    Future Appointments   Date Time Provider Department Center   10/10/2023 10:30 AM Maryhelen Therisa Reusing, CPP [facility]   10/17/2023  4:20 PM Delilah Evan [place], DO [facility]   01/28/2024  3:10 PM Betha Romero [place], MD [facility]       I spent a total of 40 minutes face to face with the patient delivering clinical care and providing education/counseling.    Stefano Gentile, PharmD  PGY-1 Ambulatory Care Pharmacy Resident   _________________________________________________  Therisa Maryhel[place], PharmD, CPP, [facility]  Family Medicine Clinical Pharmacist

## 2023-10-10 NOTE — Unmapped (Signed)
 COMPLEX CASE MANAGEMENT   Brief Note    Patient remains IP at [facility]. Complex CM will re enroll after discharge.       Paul Hines - Case Manager   [facility] Clinical Services  [address]  [place], [place] [address]  p. [phone]   [email or web address]

## 2023-10-11 NOTE — Unmapped (Signed)
 Hi Dr. Albertina,    There is a form in your box requiring your signature     Thank you,  Massie

## 2023-10-11 NOTE — Unmapped (Signed)
 Complex Case Management  SUMMARY NOTE    Attempted to contact pt today at Cell number to introduce Complex Case Management services. Left message to return call.; 1st attempt    Discuss at next visit: Introduction to Complex Case Management      Adrian Prince - Case Manager   [facility] Clinical Services  [address]  [place], [place] [address]  p. [phone]   [email or web address]

## 2023-10-12 NOTE — Unmapped (Signed)
 Copied from CRM #[id number]. Topic: Access To Clinicians - Req Clinic Call Back  >> Oct 12, 2023  2:07 PM [place] G wrote:  Reason of the Call: R/S Transition FM    Requesting: R/S Transition FM    Supporting details:R/S Transition FM    Appointment: The patient is scheduled for 10/16/23.  They did not want to be added to wait list.      The patient preferred contact: Cell Phone Telephone Information:  Mobile          [phone]    Routine callback turnaround time: 24-48 business hours. Programmer, systems Notified)

## 2023-10-15 DIAGNOSIS — C73 Malignant neoplasm of thyroid gland: Principal | ICD-10-CM

## 2023-10-15 NOTE — Unmapped (Signed)
 [facility] form faxed to [phone]

## 2023-10-15 NOTE — Unmapped (Signed)
 Transitions of Care Note    Subjective      Admission Date: 11/07/21  Discharge Date: 11/07/21  Discharge Hospital/Unit: [facility] DEPT [facility]  [facility] and sent to [facility]    Post discharge interactive communication via telephone was made with patient on 9/12 and I have reviewed the information from that communication.    Today's (10/16/2023) face to face interactive visit is within 7 days days of discharge.    Chief Complaint: Patient is here in follow-up to their recent hospitalization and to discuss the following medical problems: see below    HISTORY OF PRESENT ILLNESS:    Paul Hines is a 53 y.o. male who presents for transitions hospital follow up.    Hospital Course: Admitted to [facility] 8/31-9/11 and discharged to [facility] for sober living. He was admitted for airway monitoring due to supraglottic edema    53 y.o. 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     Recent admission 8/20-8/24 for airway monitoring. Symptoms started with dysphagia a few weeks ago. Had dyspnea and cough. Unclear cause, ENT scoped and noted supraglottic edema. He received decadron q8 for 24 hour with treatment response. He did not require intubation. He was started on vanc/levoquin but was d/c'd due to no concerns for infection.     He follows with [facility] s/p resection w/Dr. Jessee on 04/12/2020 and neck dissection on 07/28/2020. He has metastatic dx w/pulmonary involvement on PET/CT 12/2021. Treated with 1L Lenvima  10mg  daily (02/21/21-12/2022). It was discontinued due to adverse effects and poor tolerance. He is on larotrectinib  given TPR-NTRK1 chromosomal rearrangement. Last imaging was 8/6 with continued treatment response. He has never had radiation to his neck.     He says that this morning (8/31) his throat started hurting and he had difficulty/painful swallowing. Reported new onset throat tightness and SOB. No stridor or increased WOB, and tolerated lying flat. Symptoms are the same as prior presentation two weeks ago. He is able to speak, breathe, and swallow food and liquids. He says he feels that he is drooling more than usual. He does not have stridor. He denies fevers or chills but endorses rhinorrhea.      Admitted to MICU for airway watch in setting of epiglottic edema. ENT performed flexible laryngoscopy 9/1 which revealed asymmetric epiglottic edema with supraglottic crowding. VC and subglottis not clearly visualized d/t crowding but glottic space was visualizable. No masses/lesions. Hypopharynx: pyriform sinuses and postcricoid area clear with some pooling but tolerated by patient and could clear. Larynx: Normal laryngeal mucosa without erythema, ulceration, polyps, nodules, masses or lesions. Vocal fold mobility: unable to fully evaluate d/t supraglottic crowding but glottic space was visualized. ENT re-evaluated on 9/2 and did not perform repeat laryngoscopy given patient stability. Patient was started on Decadron 8 mg q8 hours in ICU and continued at time of transfer.     SLP consulted during admission who recommended VFSS but ICU team deferred. Restarted amlodipine  and coreg  9/2, HCTZ 9/1. Holding ARB in setting of supraglottic edema and discussiong with allergy and immunology. Allergy and immunology consulted regarding question of angioedema.         Treated with a steroild taper while admitted and losartan -hydrochlorothiazide  discontinued due ot supraglottic edema. Was transitioned to hydrochlorothiazide  25, increased correg to 12.5 mg BID. Increased insulin  regimen to glargine 26  U and lispro 6 U TID AC      Interval update:     - notes that his throat still feels swollen and he is struggling to swallow at times. Overall feels better than when in the hosptial and is swallowing his own saliva well. Has been coughing and had a running nose. No other sick symptoms such as fever             The remaining 10 systems reviewed were negative.      Patient has been seen by the pharmacist today and I have reviewed their note and recommendations. See pharmacist note for details.    I have reviewed the patients discharge summary for this hospitalization.  I have also reviewed the problem list, allergies, family and social history and updated them as needed.         Objective     Paul Hines  height is 170.2 cm (5' 7) and weight is 81 kg (178 lb 9.6 oz). His temporal temperature is 37 ??C (98.6 ??F). His blood pressure is 158/91 and his pulse is 83.     GEN: well appearing, NAD    HEENT: NCAT, No scleral icterus. Conjunctiva non-erythematous. MMM. Voice does sound full and continues to clear his throat but no stridor noted on exam  CV: Regular rate and rhythm. No murmurs/rubs/gallops.   Pulm: Normal work of breathing on RA. . CTAB. No wheezing, crackles, or rhonchi.   Neuro: A&O x 3. No focal deficits.   Ext: No peripheral edema.  Palpable distal pulses.   Skin: No obvious rashes or skin lesions.    Labs:  I have reviewed the labs from this hospitalization and the ones on the day of discharge and have followed up on any pending labs at the time of discharge.  See Epic Labs section for details.         Assessment/Plan:     Problem List Items Addressed This Visit       Thyroid cancer    (CMS-HCC)    Diabetes    (CMS-HCC)    Essential hypertension - Primary    Hidradenitis suppurativa     Other Visit Diagnoses         Supraglottic edema               #supraglottic edema:   Continues to have sensation of throat being swollen and voice does sound full on exam today. He is satting well and tolerating his secretions without any drooling and is overall well appearing. Discussed signs of airway compromise and when to return to the ED. Offered viral swab (covid/flu/rsv) given his cough but notes that this symptom has been present since hospitalization and would like to defer. Will have close follow up with myself to check on his throat swelling. He has not been taking his loratidine so discussed starting this BID. Confirmed that he was able to pick this up from the pharmacy.     #DMII:  Has not been taking his insulin  due to confusion. Was hyperglycemic in the hospital and had recently up titrated his insulin . Last A1c was 10.9.   - increase pioglitazone  30 mg -->45 mg when able to refill pill pack (will follow up with pharmacy in 2 weeks)  - continue metfomin BID  - continue glargine 26 U nightly   - hold mealtime insulin  for now  - new dexcom sensor placed in clinic today.  - follow up with clinical pharmacist in 2 weeks      #  HTN  Has not been taking hydrochlorothiazide  regularly. Has not been taking losartan  since hospitalization as well.     - restrart hydrochlorothiazide  25 mg  - continue correg 12.5 mg BID  - continue spironolactone  25 mg  - continue to hold losartan     #papillary thyroid cancer:   - has follow up with hematology tomorrow    #housing insecurity  Will be staying in [facility]. Struggles with taking his numerous medications and with recent hospitlaizations has had even more confusion about his medications. Today we spent time reviewing reason for his medications and reviewing how to take. Working on getting pill packs set up again but will not be able to do this until next month.     #hypothyroidism:  Endorses taking the synthroid - will try to get in his pill pack.     Medications prescribed or ordered upon discharge were reviewed today and reconciled with the most recent outpatient medication list.  Medication reconciliation was conducted by a prescribing practitioner, or clinical pharmacist.      The following medications changes were made:   [ ]  resent pill packs in 2 weeks.   [ ]  follow up allergy  [ ]  follow up heme/onc  [ ]  follow up BP  [ ]  follow up BG    Biggest Risk for Readmission: medication adherence concerns    Medical decision making was of high (00503- must be seen within 7 days) complexity.    Necessary referral have been made.  See Visit Summary for details of referrals.    I will forward my plan and recommendations to patients PCP, Oliveah Zwack, Schuyler [place], MD    Follow-up with PCP or another provider has been scheduled:   Future Appointments   Date Time Provider Department Center   10/16/2023  2:20 PM Albertina Schuyler [place], MD [facility]   10/17/2023  3:15 PM ADULT ONC LAB [facility]   10/17/2023  4:20 PM Sheth, Siddharth Hemant, DO [facility]   01/28/2024  3:10 PM Betha Romero [place], MD [facility]        Total time spent face to face with the patient was 40 minutes of  which 40 minutes were spent counseling/coordinating care regarding his: recent hospitalization and the following conditions: see above         [facility]  at [facility]  [address] [place], [place] [address]  Telephone [phone]  Fax [phone]  [email or web address]

## 2023-10-15 NOTE — Unmapped (Addendum)
 Reason for Visit: Hospital Follow-up Medication Management    History of Present Illness:  ISHMAEL Hines is a 53 y.o. 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 Upon hospital discharge, the following medication changes were made: START loratadine 10 mg BID, STOP losartan -HCTZ 100-25 mg daily and REPLACE with HCTZ 25 mg daily, INCREASE carvedilol  from 6.25 mg to 12.5 mg BID, INCREASE insulin  glargine from 24 to 26 units nightly, and STOP diazepam  5 mg daily. Pt presents to the [facility] for follow-up without all of his medication bottles but has Rx receipts from [facility] outpatient pharmacy for all meds he picked up at discharge.    Since discharge, Paul Hines feels as though his breathing has not improved significantly and still has difficulty when swallowing or speaking for long periods of time. He has also moved to the [facility] [employer] and lost several of his meds in the process, including previous pill boxes and CGM sensor + reader. Has not been adherent to most meds due to confusion around what they are for and if he should be taking them. Of note, has not been taking any insulin  since discharge. Has been unable to check BG and BP at home due to current lack of supplies.    Medication Adherence and Access:  Missed doses?: yes  Uses pillbox?: no - previously had pill packs + weekly pillbox organizer but has misplaced these since moving to shelter  Anyone else assist with medication organization? no  Current insurance coverage: Medicaid  Preferred Pharmacy: [facility] outpatient pharmacy and [facility] (for pill packs)  Medications affordable?: no  Needs refills? yes - all Rx's    Allergies:   Allergies[1]    Medications: Inpatient and outpatient medication lists reviewed and reconciled today.   Current Outpatient Medications on File Prior to Visit   Medication Sig Notes    amlodipine  (NORVASC ) 10 MG tablet Take 1 tablet (10 mg total) by mouth daily. Taking    Filled Rx for 30ds on 10/11/23 at [facility] outpt pharmacy    atorvastatin  (LIPITOR) 80 MG tablet Take 1 tablet (80 mg total) by mouth nightly. Taking    Filled Rx for 30ds on 10/11/23 at [facility] outpt pharmacy    bimekizumab -bkzx 160 mg/mL AtIn Inject the contents of 2 pens (320mg ) under the skin every 2 weeks for the first 16 weeks followed by every 4 weeks. Taking - had 1 dose in hospital; not since discharge    carvedilol  (COREG ) 6.25 MG tablet Take 1 tablet (6.25 mg total) by mouth two (2) times a day. Not sure if taking    Filled Rx for increased dose of carvedilol  12.5 mg tabs x30ds on 10/11/23 at [facility] outpt pharmacy    cetirizine  (ZYRTEC ) 10 MG tablet Take 1 tablet (10 mg total) by mouth daily. Not taking - Rx'd loratadine at hospital discharge (see below)    clindamycin  (CLEOCIN  T) 1 % lotion Apply topically two (2) times a day. Not taking     clindamycin  (CLEOCIN ) 300 MG capsule Take 1 capsule (300 mg total) by mouth two (2) times a day. Taking  Filled Rx for 30ds on 10/11/23 at [facility] outpt pharmacy    FLUoxetine  (PROZAC ) 10 MG capsule Take 1 capsule (10 mg total) by mouth daily. Not taking    Filled Rx for 30ds on 10/11/23 at [facility] outpt pharmacy    gabapentin  (NEURONTIN ) 300 MG capsule Take 1 capsule (300 mg total) by mouth two (2) times a day. Taking    hydrOXYzine  (ATARAX ) 25 MG tablet Take 1 tablet (25 mg total) by mouth every eight (8) hours as needed for itching. Not taking    Filled Rx for 25 mg capsules nightly PRN #30 on 10/11/23 at [facility] outpt pharmacy    ibuprofen  (MOTRIN ) 600 MG tablet Take 1 tablet (600 mg total) by mouth two (2) times a day. Taking PRN      insulin  glargine (BASAGLAR , LANTUS ) 100 unit/mL (3 mL) injection pen Inject 0.24 mL (24 Units total) under the skin nightly. Not taking since discharge    insulin  lispro (HUMALOG ) 100 unit/mL injection pen Inject 6 Units under the skin Three (3) times a day before meals. Not taking since discharge    larotrectinib  (VITRAKVI ) 100 mg capsule Take 1 capsule (100 mg total) by mouth two (2) times a day . Unsure if taking    levothyroxine  (SYNTHROID ) 300 MCG tablet Take 1 tablet (300 mcg total) by mouth daily. Taking     Filled Rx for 150 mcg tabs x2/day x30ds on 10/11/23 at [facility] outpt pharmacy    losartan -hydroCHLOROthiazide  (HYZAAR ) 100-25 mg per tablet Take 1 tablet by mouth daily. Not taking - replaced with HCTZ 25 mg tabs (see below)    metFORMIN  (GLUCOPHAGE -XR) 500 MG 24 hr tablet Take 2 tablets (1,000 mg total) by mouth daily before breakfast. Taking    Filled Rx for 30ds on 10/11/23 at [facility] outpt pharmacy    naloxone (NARCAN) 0.4 mg/mL injection Infuse 1 mL (0.4 mg total) into a venous catheter. (Patient not taking: Reported on 07/26/2023) Not taking    nicotine  (NICODERM CQ ) 21 mg/24 hr patch Place 1 patch on the skin daily. Remove old patch before applying new one. Taking-using daily    nicotine  polacrilex (NICORETTE) 4 MG gum Chew 1 piece (4 mg total) and park in cheek every hour as needed for smoking cessation. Max is up to 24 pieces/day. Taking-using ~6 per day    OLANZapine  (ZYPREXA ) 5 MG tablet Take 1 tablet (5 mg total) by mouth nightly. Not taking    ondansetron (ZOFRAN-ODT) 4 MG disintegrating tablet  (Patient not taking: Reported on 09/05/2023) Not taking    pioglitazone  (ACTOS ) 30 MG tablet Take 1 tablet (30 mg total) by mouth daily. Not sure if taking    Filled Rx for 30ds on 10/11/23 at [facility] outpt pharmacy    polyethylene glycol (GLYCOLAX ) 17 gram/dose powder Take 17 g by mouth daily as needed. (Patient not taking: Reported on 09/05/2023) Not taking    rifAMPin  (RIFADIN ) 300 MG capsule Take 1 capsule (300 mg total) by mouth daily. Taking    Filled Rx for 30ds on 10/11/23 at [facility] outpt pharmacy    spironolactone  (ALDACTONE ) 25 MG tablet Take 1 tablet (25 mg total) by mouth daily. Taking    Filled Rx for 30ds on 10/11/23 at [facility] outpt pharmacy    traZODone  (DESYREL ) 50 MG tablet Take 1 tablet (50 mg total) by mouth nightly. Taking    Filled Rx for 30ds on 10/11/23 at [facility] outpt pharmacy    triamcinolone  (KENALOG ) 0.1 % ointment Apply topically two (2) times a  day. Apply twice a day to affected areas of the skin until the skin feels smooth, then stop. Not taking     Additional medications not on current list:  HCTZ 25 mg tabs - filled Rx for 30ds on 10/11/23 at [facility] outpt pharmacy  Loratadine 10 mg tabs - filled Rx for 30ds (#60 for BID dosing) on 10/11/23 at [facility] outpt pharmacy  Nystatin 100,000 unit/g cream - filled Rx for BID x10d (#30g) on 10/11/23 at [facility] outpt pharmacy    Assessment and Plan:     # Supraglottic edema: continued swelling in throat and sx of dysphagia when speaking only slightly improved from recent admission and also with persistent cough that is worsened when talking for long periods of time. However, pt has not been taking antihistamine (Rx'd loratadine at discharge) as recommended by ENT so encouraged him to start taking this BID. Confirmed he is no longer taking losartan  due concern for potential angioedema, although this seems less likely if dysphagia has not improved since hospitalization. Will ensure close follow-up with ENT/allergy and PCP for ongoing monitoring and management.  Start loratadine 10 mg BID  Follow-up with Allergy/Immunology in ~1 month as scheduled     # T2DM: uncontrolled based on most recent A1c 10.9% (09/21/23). Goal A1c <7% per ADA guidelines. However, suspect recent hyperglycemia in the hospital prompting initiation and titration of prandial insulin  due to insulin  non-adherence prior to admission. Although unable to assess BG trends since discharge due to pt misplacing CGM, suspect continued hyperglycemia since he has not taken any insulin  since discharge (~5 days) and is unsure of adherence to other DM medications. Therefore, encouraged pt to resume basal insulin  and we were also able to get him set up with new Dexcom G7 receiver + 10-day sensor sample in clinic. Due to concerns w/ pt ability to manage titrate pioglitazone  but will have to wait until next CPP visit when able to adjust pill packs since pt just picked up 30-day supply of pioglitazone  30 mg tabs from [facility] outpatient pharmacy.  Stop Humalog   Continue pioglitazone  30 mg daily  Plan to titrate to 45 mg daily with next pill pack fills  Continue insulin  glargine (Lantus ) 26 units once daily    Continue metformin  XR 1000 mg daily  Repeat A1c due 12/22/23  Reviewed symptoms and treatment of hypoglycemia, including need to check BG prior to and following treatment.  SMBG monitoring: use Dexcom G7 CGM as instructed  Provided pt with new G7 Receiver + 10-day G7 sensor  Contacted [facility] pharmacy to assist w/ requesting refill on G7 sensors through insurance but initially told Medicaid insurance terminated 8/31 so asked for [facility] pharmacy to call [facility] outpatient pharmacy - later received call back confirming [facility] pharmacy was able to get updated insurance info but PA required [employer] for PA: BIN# [id number], PCN MCD, ID# [id number], Grp [facility]), PA for Dexcom G7 sensors submitted on 10/16/23   Future considerations:  Titrate metformin  to target dose of 2000 mg daily as tolerated - pt has preferred to stay at 1000 mg daily at recent visits  Titrate pioglitazone  to max of 45 mg daily as tolerated  Caution w/ SGLT2i given recent SSTI in inguinal region and uncontrolled HS affecting inguinal region (also potentially higher risk of GU infections with significantly elevated A1c)  Could still consider GLP-1 RA given non-medullary thyroid carcinomas not a contraindication and have unclear association - also potential to improve CKD/CV-related outcomes given microalbuminuria present (if SGLT2i not pursued)    #  HTN: BP uncontrolled based on average clinic BP 158/91. Gaol BP <130/80 per ACC/AHA Guidelines. However, suspect non-adherence w/ BP med regiment per pt report today. Now off losartan -HCTZ combo pill given concern for losartan  contributing to angioedema and has Rx for HCTZ on its own so will continue this without changes today along with increased carvedilol  dose prescribed at hospital discharge. Due to non-adherence concerns and recent Rx fills x30 days, will defer further medication changes at this time until we can further assess medication efficacy and make adjustments in future pill packs.  Continue HCTZ 25 mg daily  Continue amlodipine  10 mg daily  Continue carvedilol  12.5 mg BID  Continue spironolactone  25 mg daily  Future considerations:  Consider combo pill options to simplify med regimen and reduce pill burden (e.g. amlodipine /atorvastatin , HCTZ/spironolactone )    # Medication Management   Encouraged use of medication pill box and reviewed appropriate use. Reviewed the indication, dose, and frequency of each medication with patient.   Plan to get patient re-established with pill packs from [facility] at future CPP visit. Due to recently picking up 30 day supply of most Rx's from [facility] outpatient pharmacy as well as housing insecurity, will defer for next visit. In the meantime, provided pt with updated medication action plan (i.e. chart of all meds + indications + dosing).      Recommendations and medication-related problems were discussed directly with the patient's transitions of care physician, Dr. Darolyn Centers, immediately following the pharmacist visit prior to the physician visit. Questions/concerns were addressed to the patient's satisfaction. I spent a total of 35 minutes face to face with the patient delivering clinical care and providing education/counseling.      Future Appointments   Date Time Provider Department Center   10/17/2023  3:15 PM ADULT ONC LAB [facility]   10/17/2023  4:20 PM Delilah Evan Hemant, DO [facility]   10/23/2023 11:10 AM Ritchie, Schuyler Antigua, MD [facility]   11/01/2023 10:00 AM Maryhelen Therisa Reusing, CPP [facility]   11/16/2023  2:10 PM Ritchie, Schuyler Antigua, MD [facility]   01/28/2024  3:10 PM Betha Romero [place], MD [facility]     __________________________________________________    Lavanda Gentile, PharmD  PGY-1 Ambulatory Care Pharmacy Resident    Therisa Maryhelen, PharmD, CPP, [facility]  Family Medicine Clinical Pharmacist         [1]   Allergies  Allergen Reactions    Penicillin Anaphylaxis    Fish Derived     Lisinopril     Peanut Butter Flavor      Per allergy testing    Penicillins Other (See Comments)     Per patient, when he took at age 5 his legs and arms shook. Not sure if it was seizure or not.     Shellfish Containing Products     Soy

## 2023-10-15 NOTE — Unmapped (Signed)
 I spoke to Paul Hines to confirm upcoming appointment on Wednesday after recent admission at [facility]. He shared that he is feeling better and doing okay. We discussed getting labs completed prior to appointment with Dr. Delilah and that I would request for our intake team to get scans from [facility] transferred to our chart. He appreciated the call and check in.

## 2023-10-16 DIAGNOSIS — C73 Malignant neoplasm of thyroid gland: Principal | ICD-10-CM

## 2023-10-16 MED ORDER — PREDNISONE 20 MG TABLET
ORAL_TABLET | Freq: Every day | ORAL | 0 refills | 1.00000 days | Status: CN
Start: 2023-10-16 — End: 2023-10-17

## 2023-10-16 NOTE — Unmapped (Addendum)
 Thank you for attending your hospital follow-up appointment today. Here is a summary of our visit.    Instructions from our visit today:  Current medications:  Name of Medication Morning Evening What It's For   Loratadine  10 mg tablet 1 tablet 1 tablet Throat swelling and breathing   Amlodipine   10 mg tablet 1 tablet   High blood pressure   Atorvastatin   80 mg tablet   1 tablet High cholesterol  Prevent future heart attack and stroke   Carvedilol   12.5 mg tablet 1 tablet 1 tablet High blood pressure   Clindamycin   300 mg capsule 1 capsule 1 capsule Skin infection   Rifampin   300 mg capsule 1 capsule 1 capsule Skin infection   Levothyroxine   150 mcg tablet 2 tablets   Thyroid supplement   HCTZ  25 mg tablet 1 tablet   High blood pressure   Lantus  insulin  Inject 26 units once daily. Diabetes   Metformin  XR  500 mg tablet 2 tablets   Diabetes   Pioglitazone   30 mg tablet 1 tablet   Diabetes   Spironolactone   25 mg tablet 1 tablet   High blood pressure   Gabapentin   300 mg capsule 1 capsule 1 capsule Cancer-related pain   Fluoxetine   10 mg capsule 1 capsule   Depression   Trazodone   50 mg tablet   1 tablet Sleep and mood   Viktrakvi (larotrectinib )  100 mg capsule 1 capsule 1 capsule Thyroid cancer   Bimzelx  injection Inject 2 pens (320 mg) every 2 weeks for 16 weeks. Hidradenitis suppurativa   As Needed Medications   Hydroxyzine   25 mg tablet Take 1 tablet (25 mg) by mouth every 8 hours as needed. Itching   Ibuprofen   600 mg tablet Take 1 tablet (600 mg) by mouth twice daily as needed. Pain   Gabapentin   300 mg capsule Take 1 capsule (300 mg) by mouth twice daily.   Pain   Nicotine   21 mg/24 hour patch Place 1 patch on the skin daily. Decrease cigarette  cravings   Nicotine   4 mg gum Chew and park 1 piece of gum in cheek every 1 hour as needed. Cigarette cravings         Social Work Resources:   Nurse, mental health C. Rudy HUGHS  Care Manager  [facility]  Ph: [phone]    [facility] use clinic at [facility]): [address],   [place], [place] [address]   Phone: [phone]     If you need to schedule an appointment or get a message to your provider:  Please go to [email or web address] and sign in to your [facility] Chart or call us  at [phone].     If you need to request medication refills:  Please request a refill via [facility] Chart at [email or web address], or have your pharmacy send a request electronically or by sending a fax to [phone].    If you have urgent healthcare needs after normal business hours, on weekends, or during holidays:  We have extended hours available on Monday, Tuesday, and Thursday (5-7pm). Call [phone] to schedule an appointment.   [facility] provides extended hours and greater access to care for all patients. Run by [facility] Department of Family Medicine, we offer walk-in care for health issues that do not require a trip to the emergency room. No appointment needed.   Sunday: 12:00PM to 5:00PM  Monday - Friday: 8:00AM to 7:00PM  Saturday:  12:00PM to 5:00PM  Call the [facility] [address] 24/7 Nursing Line at [phone] to get nurse advice.    Housing Resources    For all [place] counties, [place] 211 keeps an updated list of emergency shelters. You can call 2-1-1 or visit https://nc211.org      [facility] Housing Helpline: Call (229)513-3659 or email housinghelp@[place] .gov    [facility] (men): Call [phone] ext [phone]; [address], [place], [facility]  Entry [place]: [phone]    [facility] (survivors of domestic violence): Call [phone].    [employer] (families and individal adults): Call  [phone]    [employer] [facility] [place] (individual adults): Call [phone] x. [phone]; [address] , [place], [place]    [place] Housing Search (all counties)  Call: [phone]   Visit www.nchousingsearch.org  Includes subsidized housing, private rentals, and specific ways to search for senior and veteran apartments.     [facility] [employer] or email the [facility] [employer] for referrals to housing programs and resources in [place] including subsidized housing, emergency housing, and programs like the [employer].   Phone: (405)711-1447   Email housinghelp@[place] .gov.    Transportation  If you have medicaid direct through your county:  Call the number below to apply for non-emergent medical transportation or to schedule a ride:  [place]: [phone]     [facility] Psychiatric: [phone]   [facility] DSS: [phone]  [facility] DSS: [phone]  (For other counties, call your local DSS: RightWingLunacy.co.za)    Transportation Resources - [facility] for [employer]  [phone] ext. [phone]  -  dcslnc.org  Transportation coordination assistance for people who are 6+ years old.    [place] [employer]:  Phone: 6821798085  -  LocalTux.com.cy?   Monthly passes as well as discount passes for disabled and elderly. Local bus service as well as buses to [place], [place] and [place].?      [place] ACCESS:  Phone: [phone]  -  WeirdColors.co.za  Provides ADA paratransit service for eligible riders to all locations within the [place] of [place] and to any location outside the [place] that is three-quarters of a mile of any fixed-route bus route operated by [employer].    [employer]:  Phone: 307-504-3332 (TTY)?865-520-0213  -   TanningAlert.cz  Monthly passes as well as discount passes for teens, disabled, and elderly. Local bus service as well as buses to [place], [place] and [place].?      [employer] ACCESS:  Phone: [phone] (TTY)?680-275-5239 -   https://h[place].com/  Operates its fixed-route and paratransit services in accordance with the Americans with Disabilities Act and is designed to accommodate the mobility needs of individuals with disabilities or functional limitations.    Transportation Resources - [facility] Transit  Phone:?708-716-0815  -  VerifiedStats.hu?   For people in the [place] or [place] city limits.    [facility] Transit EZ Rider  EZ Proofreader and Certification: 781-578-4046?   Website: AnonymousMortgage.hu?   Provides paratransit (curb-to-curb service for qualified residents) in [place] and [place]. Door-to-door service available by request for individuals who are considered disabled.     [employer]:  Phone: 442-404-9399 (TTY)?682-096-4821  -   TanningAlert.cz  Monthly passes as well as discount passes for teens, disabled, and elderly. Local bus service as well as buses to [place], [place] and [place].?      [employer] ACCESS:  Phone: (973)604-6377 (TTY)?713-552-3815 -   https://[place].com/  Operates its fixed-route and paratransit services in accordance with the Americans with Disabilities Act and is designed to accommodate the mobility needs of individuals with disabilities or functional limitations.    [employer]  Phone: 9371619038 or 9784139883 (TDD/TTY)  -  StrictlyTechnology.gl  For people who live in [place] or [place] ([place] and [place] residents should use [employer]).    [employer]  Website: https://naborforce.com  Phone: 419-577-7142  $35/hour    [employer]  Website: https://www.rtperrandservice.com/  Phone: 916-020-2104   Email: [email or web address] .[email or web address]  $45/hour for waiting & variable transportation costs    [employer]  Website: www.ncseniorsonthego.com  Phone: 248 860 6121  Email: sotgo@ncseniorsonthego .com   $40/hour, first 10 miles are included in rate. 2 hour minimum for service.     [employer]  Website: https://www.rtperrandservice.com/  Phone: 985-662-3549   Email: [email or web address]  $45/hour for waiting & variable transportation costs

## 2023-10-16 NOTE — Unmapped (Signed)
 Family Medicine  Care Management Transitions of Care Note    Presenting Problem:  Paul Hines has been identified as a Transitions patient who is at risk for readmission.    Paul Hines presents to [facility] 5 days post discharge. He was hospitalized for a headache and was found to have Covid. He reports feeling better since discharge. SW observed patient coughing and becoming short of breath during assessment.    Assessment:  Social History[1]    Any home Hines services recommended at discharge? Yes  Any DME needs identified at discharge? No    Home Hines Services:   Current Home Hines Agency: [facility]   Current services: RN wound care  New order: no    DME:  N/A    Personal Care Service/Personal Aide: N/A    Behavioral Hines/Substance Use:  Behavioral Hines Provider: Per chart review patient follows a psychiatrist at [facility]. He was interested in [facility] to connect with individual therapy around his substance use. SW provided patient with the phone number for [facility] for him to contact.     Advanced Directives: does  have on file. Hines Care Decision Maker was updated today.    Intervention:  Introduced self and role at [facility].  Reviewed events leading to hospitalization.  SW used problem solving skills to determine barriers to care.  SW reviewed supports noting main source of support.  Gathered current concerns related to current Hines conditions, medications, and mental Hines.  SW debriefed information with pharmacist and provider.   Reviewed same day or after hours care options.    SDOH needs:  Transportation needs: Yes  Housing concerns: Yes  Food/Nutrition Need: Yes  Financial needs: No  Medication affordability: No  I provided an intervention for the [employer], Housing, and Transportation Needs SDOH domain. The intervention was Provided [employer].       Additional information/Plan:  Paul Hines was provided with this writer's direct office phone number should additional needs arise.    [facility], LCSW  [facility]         [1]   Social History  Socioeconomic History    Marital status: Single     Spouse name: None    Number of children: None    Years of education: None    Highest education level: None   Tobacco Use    Smoking status: Every Day     Types: Cigarettes     Passive exposure: Current    Smokeless tobacco: Never   Vaping Use    Vaping status: Never Used   Substance and Sexual Activity    Alcohol  use: Yes    Drug use: Not Currently     Types: Marijuana     Comment: quit in 1992   Other Topics Concern    Do you use sunscreen? No    Tanning bed use? No    Are you easily burned? No    Excessive sun exposure? No    Blistering sunburns? No     Social Drivers of Psychologist, prison and probation services Strain: Medium Risk (10/15/2023)    Received from [facility]    Overall Financial Resource Strain (CARDIA)     Difficulty of Paying Living Expenses: Somewhat hard   Food Insecurity: Food Insecurity Present (10/15/2023)    Received from [facility]    Hunger Vital Sign     Within the past 12 months, you worried that your  food would run out before you got the money to buy more.: Sometimes true     Within the past 12 months, the food you bought just didn't last and you didn't have money to get more.: Sometimes true   Transportation Needs: No Transportation Needs (10/15/2023)    Received from [facility] - Transportation     In the past 12 months, has lack of transportation kept you from medical appointments or from getting medications?: No     Lack of Transportation (Non-Medical): No   Physical Activity: Inactive (07/02/2023)    Exercise Vital Sign     Days of Exercise per Week: 0 days     Minutes of Exercise per Session: 0 min   Stress: Stress Concern Present (07/02/2023)    Paul Hines - Occupational Stress Questionnaire     Feeling of Stress : Very much   Social Connections: Socially Isolated (07/02/2023)    Social Connection and Isolation Panel     Frequency of Communication with Friends and Family: Never     Frequency of Social Gatherings with Friends and Family: Never     Attends Religious Services: Never     Database administrator or Organizations: No     Attends Banker Meetings: Never     Marital Status: Never married   Housing: High Risk (10/02/2023)    Received from [facility] Stability Vital Sign     In the last 12 months, was there a time when you were not able to pay the mortgage or rent on time?: Yes     In the past 12 months, how many times have you moved where you were living?: 2     At any time in the past 12 months, were you homeless or living in a shelter (including now)?: Yes

## 2023-10-17 NOTE — Unmapped (Signed)
 Addended by: MARYHELEN THERISA BROCKS on: 10/17/2023 01:19 PM     Modules accepted: Orders

## 2023-10-18 ENCOUNTER — Ambulatory Visit: Admit: 2023-10-18 | Discharge: 2023-10-18 | Payer: Medicaid (Managed Care)

## 2023-10-18 ENCOUNTER — Ambulatory Visit
Admit: 2023-10-18 | Discharge: 2023-10-18 | Payer: Medicaid (Managed Care) | Attending: Student in an Organized Health Care Education/Training Program | Primary: Student in an Organized Health Care Education/Training Program

## 2023-10-18 NOTE — Unmapped (Signed)
 This patient has been reviewed for the Complex Case Management services and is not eligible at this time due to Insurance no longer contracted/No Value Care [facility]. To have this patient reevaluated for Complex Case Management please place an AMB Referral for Case Management to the Personal Health Advocate Department.      Paul Hines - Case Manager   [facility] Clinical Services  [address]  [place], [place] [address]  p. [phone]   [email or web address]

## 2023-10-18 NOTE — Unmapped (Signed)
 Patient did not show up for his appointment.    We will follow up and re-schedule.    Jamine Highfill   Head and Neck Medical Oncology  [facility] [employer] 

## 2023-10-19 MED FILL — DEXCOM G7 SENSOR DEVICE: ORAL | 90 days supply | Qty: 9 | Fill #1

## 2023-10-19 NOTE — Unmapped (Signed)
 Hi Dr. Albertina,    There is a form in your box requiring your signature     Thank you,  Massie

## 2023-10-19 NOTE — Unmapped (Signed)
 Signed and placed in team 2 action box

## 2023-10-22 NOTE — Unmapped (Unsigned)
 [facility]    Patient ID: Paul Hines is a 53 y.o. male who presents for {AJM Reason for Visit:25429}  PCP: Josselin Gaulin, Schuyler Antigua, MD      Informant: {INFORMANT with relatives:23352}    Assessment/Plan:      Assessment & Plan  Essential hypertension         Type 2 diabetes mellitus with hyperglycemia, with long-term current use of insulin     (CMS-HCC)             Supraglottic edema           {TIP - HCC- RAFF Pilot- Clinical Documentation Specialist Recommendations-  No specialty comments available.   This text will self delete upon signing note:75688}       Preventive services addressed today  {AJM Preventive Services:41504}    {AJM PCMH Review:30708::-- Patient verbalized an understanding of today's assessment and recommendations, as well as the purpose of ongoing medications.}    I personally spent *** minutes face-to-face and non-face-to-face in the care of this patient, which includes all pre, intra, and post visit time on the date of service.    No follow-ups on file.    Subjective:     No chief complaint on file.      HPI  ***    {PTHomeBP   :88857}                ROS  As per HPI.    RELEVANT PAST MEDICAL, FAMILY, SURGICAL, SOCIAL HISTORY  ***  ___________________________________  CURRENT MEDS  Current Medications[1]    ___________________________________  ALLERGIES  Allergies[2]      Objective:       Vital Signs  There were no vitals taken for this visit.     Exam  Physical Exam     Data  ***       [1]   Current Outpatient Medications   Medication Sig Dispense Refill    alcohol  swabs  (ALCOHOL  PREP PADS) PadM Use three times a day as needed. 100 each 0    amlodipine  (NORVASC ) 10 MG tablet Take 1 tablet (10 mg total) by mouth daily. 30 tablet 11    atorvastatin  (LIPITOR) 80 MG tablet Take 1 tablet (80 mg total) by mouth nightly. 30 tablet 11    BD LUER-LOK SYRINGE 3 mL 23 gauge x 1 1/2 Syrg       bimekizumab -bkzx 160 mg/mL AtIn Inject the contents of 2 pens (320mg ) under the skin every 2 weeks for the first 16 weeks followed by every 4 weeks. 4 mL 4    bimekizumab -bkzx 160 mg/mL AtIn Inject the contents of 2 pens (320mg ) subcutaneous every 4 weeks 2 mL 11    blood sugar diagnostic (GLUCOSE BLOOD) Strp Test blood glucose once daily. 50 strip 11    blood-glucose meter kit Disp. blood glucose meter kit preferred by patient's insurance. Check blood sugars as directed by provider. Dx: Diabetes, E11.9 1 each 11    carvedilol  (COREG ) 12.5 MG tablet Take 1 tablet (12.5 mg total) by mouth two (2) times a day.      clindamycin  (CLEOCIN  T) 1 % lotion Apply topically two (2) times a day. 60 mL 11    clindamycin  (CLEOCIN ) 300 MG capsule Take 1 capsule (300 mg total) by mouth two (2) times a day. 60 capsule 0    DEXCOM G7 RECEIVER Misc Use as directed. 1 each 0    DEXCOM G7 SENSOR Devi Apply Dexcom G7 sensor every 10  days. 9 each 3    empty container Misc Use as directed to dispose of Humira  pens. 1 each 2    FLUoxetine  (PROZAC ) 10 MG capsule Take 1 capsule (10 mg total) by mouth daily. 30 capsule 2    gabapentin  (NEURONTIN ) 300 MG capsule Take 1 capsule (300 mg total) by mouth two (2) times a day. 180 capsule 2    hydroCHLOROthiazide  (HYDRODIURIL ) 25 MG tablet Take 1 tablet (25 mg total) by mouth daily.      hydrOXYzine  (ATARAX ) 25 MG tablet Take 1 tablet (25 mg total) by mouth every eight (8) hours as needed for itching. 30 tablet 0    ibuprofen  (MOTRIN ) 600 MG tablet Take 1 tablet (600 mg total) by mouth two (2) times a day. (Patient taking differently: Take 1 tablet (600 mg total) by mouth once as needed.) 60 tablet 1    insulin  glargine (BASAGLAR , LANTUS ) 100 unit/mL (3 mL) injection pen Inject 0.24 mL (24 Units total) under the skin nightly. 15 mL 2    insulin  lispro (HUMALOG ) 100 unit/mL injection pen Inject 6 Units under the skin Three (3) times a day before meals. 15 mL 0    lancets Misc Test blood glucose once daily. 100 each 11    larotrectinib  (VITRAKVI ) 100 mg capsule Take 1 capsule (100 mg total) by mouth two (2) times a day . 60 capsule 5    levothyroxine  (SYNTHROID ) 300 MCG tablet Take 1 tablet (300 mcg total) by mouth daily. 30 tablet 11    loratadine (CLARITIN) 10 mg tablet Take 1 tablet (10 mg total) by mouth two (2) times a day.      metFORMIN  (GLUCOPHAGE -XR) 500 MG 24 hr tablet Take 2 tablets (1,000 mg total) by mouth daily before breakfast. 60 tablet 2    nicotine  (NICODERM CQ ) 21 mg/24 hr patch Place 1 patch on the skin daily. Remove old patch before applying new one. 28 patch 2    nicotine  polacrilex (NICORETTE) 4 MG gum Chew 1 piece (4 mg total) and park in cheek every hour as needed for smoking cessation. Max is up to 24 pieces/day. 110 each 2    nystatin (MYCOSTATIN) 100,000 unit/gram cream Apply topically two (2) times a day.      pen needle, diabetic (PEN NEEDLE) 31 gauge x 5/16 (8 mm) Ndle Injection Frequency is 1 time per day 100 each 11    pioglitazone  (ACTOS ) 30 MG tablet Take 1 tablet (30 mg total) by mouth daily. 30 tablet 5    polyethylene glycol (GLYCOLAX ) 17 gram/dose powder Take 17 g by mouth daily as needed.      rifAMPin  (RIFADIN ) 300 MG capsule Take 1 capsule (300 mg total) by mouth daily. 30 capsule 1    spironolactone  (ALDACTONE ) 25 MG tablet Take 1 tablet (25 mg total) by mouth daily. 30 tablet 11    traZODone  (DESYREL ) 50 MG tablet Take 1 tablet (50 mg total) by mouth nightly. 30 tablet 2    triamcinolone  (KENALOG ) 0.1 % ointment Apply topically two (2) times a day. Apply twice a day to affected areas of the skin until the skin feels smooth, then stop. 80 g 0     No current facility-administered medications for this visit.   [2]   Allergies  Allergen Reactions    Penicillin Anaphylaxis    Fish Derived     Lisinopril     Peanut Butter Flavor      Per allergy testing    Penicillins Other (See  Comments)     Per patient, when he took at age 1 his legs and arms shook. Not sure if it was seizure or not.     Shellfish Containing Products     Soy

## 2023-10-22 NOTE — Unmapped (Signed)
 Form faxed to [facility]

## 2023-10-25 ENCOUNTER — Inpatient Hospital Stay: Admit: 2023-10-25 | Discharge: 2023-10-26 | Payer: Medicaid (Managed Care)

## 2023-10-25 DIAGNOSIS — C73 Malignant neoplasm of thyroid gland: Principal | ICD-10-CM

## 2023-10-26 NOTE — Unmapped (Signed)
 Hi Dr. Albertina,    There is a form in your box requiring your signature     Thank you,  Massie

## 2023-11-02 NOTE — Unmapped (Signed)
Signed and placed in team 1 action box.

## 2023-11-02 NOTE — Unmapped (Signed)
 Form faxed to [phone]

## 2023-11-02 NOTE — Unmapped (Signed)
 You have a fax in your box requiring your signature.

## 2023-11-16 MED ORDER — AMLODIPINE 10 MG TABLET
ORAL_TABLET | Freq: Every day | ORAL | 11 refills | 30.00000 days | Status: CP
Start: 2023-11-16 — End: ?

## 2023-11-16 MED ORDER — NICOTINE (POLACRILEX) 4 MG GUM
BUCCAL | 2 refills | 5.00000 days | Status: CP | PRN
Start: 2023-11-16 — End: ?

## 2023-11-16 MED ORDER — GABAPENTIN 300 MG CAPSULE
ORAL_CAPSULE | Freq: Two times a day (BID) | ORAL | 2 refills | 90.00000 days | Status: CP
Start: 2023-11-16 — End: ?

## 2023-11-16 MED ORDER — TRAZODONE 50 MG TABLET
ORAL_TABLET | Freq: Every evening | ORAL | 2 refills | 30.00000 days | Status: CP
Start: 2023-11-16 — End: ?

## 2023-11-16 MED ORDER — LEVOTHYROXINE 300 MCG TABLET
ORAL_TABLET | Freq: Every day | ORAL | 11 refills | 30.00000 days | Status: CP
Start: 2023-11-16 — End: 2024-11-15

## 2023-11-16 MED ORDER — PIOGLITAZONE 30 MG TABLET
ORAL_TABLET | Freq: Every day | ORAL | 5 refills | 30.00000 days | Status: CP
Start: 2023-11-16 — End: ?

## 2023-11-16 MED ORDER — PEN NEEDLE, DIABETIC 31 GAUGE X 5/16" (8 MM)
ORAL | 11 refills | 0.00000 days | Status: CP
Start: 2023-11-16 — End: 2024-11-15

## 2023-11-16 MED ORDER — INSULIN GLARGINE (U-100) 100 UNIT/ML (3 ML) SUBCUTANEOUS PEN
Freq: Every evening | SUBCUTANEOUS | 2 refills | 62.00000 days | Status: CP
Start: 2023-11-16 — End: ?

## 2023-11-16 MED ORDER — CARVEDILOL 25 MG TABLET
ORAL_TABLET | Freq: Two times a day (BID) | ORAL | 3 refills | 90.00000 days | Status: CP
Start: 2023-11-16 — End: 2024-11-15

## 2023-11-16 MED ORDER — FLUOXETINE 10 MG CAPSULE
ORAL_CAPSULE | Freq: Every day | ORAL | 2 refills | 30.00000 days | Status: CP
Start: 2023-11-16 — End: ?

## 2023-11-16 MED ORDER — NICOTINE 21 MG/24 HR DAILY TRANSDERMAL PATCH
MEDICATED_PATCH | TRANSDERMAL | 2 refills | 28.00000 days | Status: CP
Start: 2023-11-16 — End: ?

## 2023-11-16 MED ORDER — TRIAMCINOLONE ACETONIDE 0.1 % TOPICAL OINTMENT
Freq: Two times a day (BID) | TOPICAL | 0 refills | 0.00000 days | Status: CP
Start: 2023-11-16 — End: 2024-11-15

## 2023-11-16 MED ORDER — METFORMIN ER 500 MG TABLET,EXTENDED RELEASE 24 HR
ORAL_TABLET | Freq: Every day | ORAL | 2 refills | 30.00000 days | Status: CP
Start: 2023-11-16 — End: ?

## 2023-11-16 MED ORDER — CLINDAMYCIN HCL 300 MG CAPSULE
ORAL_CAPSULE | Freq: Two times a day (BID) | ORAL | 0 refills | 30.00000 days | Status: CP
Start: 2023-11-16 — End: ?

## 2023-11-16 MED ORDER — LORATADINE 10 MG TABLET
ORAL_TABLET | Freq: Two times a day (BID) | ORAL | 11 refills | 30.00000 days | Status: CP
Start: 2023-11-16 — End: ?

## 2023-11-16 MED ORDER — POLYETHYLENE GLYCOL 3350 17 GRAM/DOSE ORAL POWDER
Freq: Every day | ORAL | 11 refills | 15.00000 days | Status: CP | PRN
Start: 2023-11-16 — End: ?

## 2023-11-16 MED ORDER — ATORVASTATIN 80 MG TABLET
ORAL_TABLET | Freq: Every evening | ORAL | 11 refills | 30.00000 days | Status: CP
Start: 2023-11-16 — End: ?

## 2023-11-16 NOTE — Unmapped (Addendum)
 Notes he has not been taking anything for his diabetes.       Orders:    insulin  glargine (BASAGLAR , LANTUS ) 100 unit/mL (3 mL) injection pen; Inject 0.24 mL (24 Units total) under the skin nightly.    metFORMIN  (GLUCOPHAGE -XR) 500 MG 24 hr tablet; Take 2 tablets (1,000 mg total) by mouth daily before breakfast.    pioglitazone  (ACTOS ) 30 MG tablet; Take 1 tablet (30 mg total) by mouth daily.

## 2023-11-16 NOTE — Unmapped (Addendum)
 BP elevated today in the setting of not taking any of his medications   - discontinue hydrochlorothiazide  and spironolactone  and increase correg to 25 mg BID per last discharge summary

## 2023-11-16 NOTE — Unmapped (Signed)
 [facility]    Patient ID: Paul Hines is a 53 y.o. male who presents for follow up of medication and hospital follow up.   PCP: Albertina Schuyler Antigua, MD      Informant: Patient came to appointment alone.    Assessment/Plan:      Assessment & Plan  Supraglottic edema  Recent eval in the ED last night with CT scan showing no progression or worsening of swellling. Swelling improved today        Essential hypertension  BP elevated today in the setting of not taking any of his medications   - discontinue hydrochlorothiazide  and spironolactone  and increase correg to 25 mg BID per last discharge summary       Type 2 diabetes mellitus with hyperglycemia, with long-term current use of insulin  (CMS-HCC)  Notes he has not been taking anything for his diabetes.       Orders:    insulin  glargine (BASAGLAR , LANTUS ) 100 unit/mL (3 mL) injection pen; Inject 0.24 mL (24 Units total) under the skin nightly.    metFORMIN  (GLUCOPHAGE -XR) 500 MG 24 hr tablet; Take 2 tablets (1,000 mg total) by mouth daily before breakfast.    pioglitazone  (ACTOS ) 30 MG tablet; Take 1 tablet (30 mg total) by mouth daily.    Pulmonary nodules  Incidentally noted on CT neck. Had a dedicated CT chest on 9/25 that showed Improving metastatic pulmonary nodules compared to 02/14/2023. No evidence of new or progressive thoracic metastatic disease. Findings compatible with partial treatment response.        Thyroid cancer    (CMS-HCC)  Missed his oncology visit in September. Will message his oncologist to see if they can reach out to reschedule now that he has a new phone.       Orders:    amlodipine  (NORVASC ) 10 MG tablet; Take 1 tablet (10 mg total) by mouth daily.    levothyroxine  (SYNTHROID ) 300 MCG tablet; Take 1 tablet (300 mcg total) by mouth daily.    gabapentin  (NEURONTIN ) 300 MG capsule; Take 1 capsule (300 mg total) by mouth two (2) times a day.    Hidradenitis suppurativa    Orders:    triamcinolone  (KENALOG ) 0.1 % ointment; Apply topically two (2) times a day. Apply twice a day to affected areas of the skin until the skin feels smooth, then stop.    clindamycin  (CLEOCIN ) 300 MG capsule; Take 1 capsule (300 mg total) by mouth two (2) times a day.    Distressed about housing issues  Currently moved from his previous shelter to [facility]. He left all of his medications at his previous shelter and currently has none. Called pharmacy and he has not  had any of his medications sent to him since August 1. I provided Keighan the number to the pharmacy to call and confirm his current address for his medications and also updated his phone number in the pharmacy system for them to call him.     He asked for an FL2 form completion but I am unsure of what is needed. Provided me with his care managers number [phone]) and I called and left a voicemail in hopes of clarifying how I can be suppportive.        Type 2 diabetes mellitus without complication, with long-term current use of insulin  (CMS-HCC)  Notes he has not been taking anything for his diabetes. When in the ED yesterday his sugars were in the 300s. Resent his metformin , pioglitazone , and  lantus  to hte pharmacy. Has pharmacy follow up on 10/23      Orders:    pen needle, diabetic (PEN NEEDLE) 31 gauge x 5/16 (8 mm) Ndle; Injection Frequency is 1 time per day    Thyroid cancer          Orders:    amlodipine  (NORVASC ) 10 MG tablet; Take 1 tablet (10 mg total) by mouth daily.    levothyroxine  (SYNTHROID ) 300 MCG tablet; Take 1 tablet (300 mcg total) by mouth daily.    gabapentin  (NEURONTIN ) 300 MG capsule; Take 1 capsule (300 mg total) by mouth two (2) times a day.             Preventive services addressed today  We did not review preventive services today    -- Patient verbalized an understanding of today's assessment and recommendations, as well as the purpose of ongoing medications.    I personally spent 60 minutes face-to-face and non-face-to-face in the care of this patient, which includes all pre, intra, and post visit time on the date of service.    No follow-ups on file.    Subjective:     Chief Complaint   Patient presents with    Follow-up       HPI  Recently discharged from [facility] on 10/3 for epiglotitis. ID was consulted and felt that DM and tobacco use was contributing and revcommended 7 day course of levofloxacin. ENT did flex scope and found similar findings as before and recommended steroid taper. He was scheduled for allergy follow up on 10/16. While admitted he had hyponatremia and his hydrochlorothiazide  and spironolactone  was stopped and his correg was increased from 12.5 mg to 25 mg BID and his amlodipine  10 mg was continued.      was seen in [facility] ED yesterday for recurrentce of his swelling and had a CT neck that showed stable pharyngeal edema but new cervical lymph node.  and Ent evaluated him. His sodium was 129      Today:  - swelling is better today   - got a new phone and missed his visit with allergy- phone number updated in chart  - left his medications at the shelter and does not feel comfortable going back there. He has not taken any of his medications.   - staying at the [place] [facility] at [address]. He thinks he wants to find a new place to stay.                           ROS  As per HPI.    RELEVANT PAST MEDICAL, FAMILY, SURGICAL, SOCIAL HISTORY  Summarized above  ___________________________________  CURRENT MEDS  Current Medications[1]    ___________________________________  ALLERGIES  Allergies[2]      Objective:       Vital Signs  BP 173/110 (BP Site: L Arm, BP Position: Sitting, BP Cuff Size: Large)  - Pulse 85  - Temp 36.4 ??C (97.6 ??F) (Temporal)  - Ht 170.2 cm (5' 7.01) Comment: pt reported - Wt 81.3 kg (179 lb 3.2 oz)  - BMI 28.06 kg/m??      Exam  Constitutional:       Appearance: Healthy appearance. Not in distress.   Eyes:      Conjunctiva/sclera: Conjunctivae normal.   Pulmonary:      Effort: Pulmonary effort is normal.   Neurological:      General: No focal deficit present.  Mental Status: Alert.      Gait: Gait is intact.                 [1]   Current Outpatient Medications   Medication Sig Dispense Refill    alcohol  swabs  (ALCOHOL  PREP PADS) PadM Use three times a day as needed. 100 each 0    BD LUER-LOK SYRINGE 3 mL 23 gauge x 1 1/2 Syrg       bimekizumab -bkzx 160 mg/mL AtIn Inject the contents of 2 pens (320mg ) under the skin every 2 weeks for the first 16 weeks followed by every 4 weeks. 4 mL 4    bimekizumab -bkzx 160 mg/mL AtIn Inject the contents of 2 pens (320mg ) subcutaneous every 4 weeks 2 mL 11    clindamycin  (CLEOCIN  T) 1 % lotion Apply topically two (2) times a day. 60 mL 11    DEXCOM G7 RECEIVER Misc Use as directed. 1 each 0    DEXCOM G7 SENSOR Devi Apply Dexcom G7 sensor every 10 days. 9 each 3    empty container Misc Use as directed to dispose of Humira  pens. 1 each 2    hydrOXYzine  (ATARAX ) 25 MG tablet Take 1 tablet (25 mg total) by mouth every eight (8) hours as needed for itching. 30 tablet 0    ibuprofen  (MOTRIN ) 600 MG tablet Take 1 tablet (600 mg total) by mouth two (2) times a day. (Patient taking differently: Take 1 tablet (600 mg total) by mouth once as needed.) 60 tablet 1    larotrectinib  (VITRAKVI ) 100 mg capsule Take 1 capsule (100 mg total) by mouth two (2) times a day . 60 capsule 5    nystatin (MYCOSTATIN) 100,000 unit/gram cream Apply topically two (2) times a day.      amlodipine  (NORVASC ) 10 MG tablet Take 1 tablet (10 mg total) by mouth daily. 30 tablet 11    atorvastatin  (LIPITOR) 80 MG tablet Take 1 tablet (80 mg total) by mouth nightly. 30 tablet 11    blood-glucose meter kit Disp. blood glucose meter kit preferred by patient's insurance. Check blood sugars as directed by provider. Dx: Diabetes, E11.9 1 each 11    carvedilol  (COREG ) 25 MG tablet Take 1 tablet (25 mg total) by mouth two (2) times a day. 180 tablet 3    clindamycin  (CLEOCIN ) 300 MG capsule Take 1 capsule (300 mg total) by mouth two (2) times a day. 60 capsule 0    FLUoxetine  (PROZAC ) 10 MG capsule Take 1 capsule (10 mg total) by mouth daily. 30 capsule 2    gabapentin  (NEURONTIN ) 300 MG capsule Take 1 capsule (300 mg total) by mouth two (2) times a day. 180 capsule 2    insulin  glargine (BASAGLAR , LANTUS ) 100 unit/mL (3 mL) injection pen Inject 0.24 mL (24 Units total) under the skin nightly. 15 mL 2    levothyroxine  (SYNTHROID ) 300 MCG tablet Take 1 tablet (300 mcg total) by mouth daily. 30 tablet 11    loratadine (CLARITIN) 10 mg tablet Take 1 tablet (10 mg total) by mouth two (2) times a day. 60 tablet 11    metFORMIN  (GLUCOPHAGE -XR) 500 MG 24 hr tablet Take 2 tablets (1,000 mg total) by mouth daily before breakfast. 60 tablet 2    nicotine  (NICODERM CQ ) 21 mg/24 hr patch Place 1 patch on the skin daily. Remove old patch before applying new one. 28 patch 2    nicotine  polacrilex (NICORETTE) 4 MG gum Chew 1 piece (4 mg total) and  park in cheek every hour as needed for smoking cessation. Max is up to 24 pieces/day. 110 each 2    pen needle, diabetic (PEN NEEDLE) 31 gauge x 5/16 (8 mm) Ndle Injection Frequency is 1 time per day 100 each 11    pioglitazone  (ACTOS ) 30 MG tablet Take 1 tablet (30 mg total) by mouth daily. 30 tablet 5    polyethylene glycol (GLYCOLAX ) 17 gram/dose powder Take 17 g by mouth daily as needed. 255 g 11    traZODone  (DESYREL ) 50 MG tablet Take 1 tablet (50 mg total) by mouth nightly. 30 tablet 2    triamcinolone  (KENALOG ) 0.1 % ointment Apply topically two (2) times a day. Apply twice a day to affected areas of the skin until the skin feels smooth, then stop. 80 g 0     No current facility-administered medications for this visit.   [2]   Allergies  Allergen Reactions    Penicillin Anaphylaxis    Fish Derived     Lisinopril     Peanut Butter Flavor      Per allergy testing    Penicillins Other (See Comments)     Per patient, when he took at age 69 his legs and arms shook. Not sure if it was seizure or not.     Shellfish Containing Products     Soy

## 2023-11-16 NOTE — Unmapped (Addendum)
 Orders:    triamcinolone  (KENALOG ) 0.1 % ointment; Apply topically two (2) times a day. Apply twice a day to affected areas of the skin until the skin feels smooth, then stop.    clindamycin  (CLEOCIN ) 300 MG capsule; Take 1 capsule (300 mg total) by mouth two (2) times a day.

## 2023-11-16 NOTE — Unmapped (Addendum)
 Missed his oncology visit in September. Will message his oncologist to see if they can reach out to reschedule now that he has a new phone.       Orders:    amlodipine  (NORVASC ) 10 MG tablet; Take 1 tablet (10 mg total) by mouth daily.    levothyroxine  (SYNTHROID ) 300 MCG tablet; Take 1 tablet (300 mcg total) by mouth daily.    gabapentin  (NEURONTIN ) 300 MG capsule; Take 1 capsule (300 mg total) by mouth two (2) times a day.

## 2023-11-16 NOTE — Unmapped (Addendum)
 Notes he has not been taking anything for his diabetes. When in the ED yesterday his sugars were in the 300s. Resent his metformin , pioglitazone , and lantus  to hte pharmacy. Has pharmacy follow up on 10/23      Orders:    pen needle, diabetic (PEN NEEDLE) 31 gauge x 5/16 (8 mm) Ndle; Injection Frequency is 1 time per day

## 2023-11-16 NOTE — Unmapped (Addendum)
 For you blood pressure:   - take amlodipine  10 mg   - take carvedilol  25 mg twice a day  - Do NOT take hydrochlorothiazide  or losartan  or spironolactone     Please call the [facility] pharmacy to update your address- their number is [phone]    Upcoming appointments:   -10/23 at 9:00- pharmacist Therisa  -10/27- [facility]- I will message the [facility] dermatology team to see if we can re-establish with them.

## 2023-11-16 NOTE — Unmapped (Addendum)
 Orders:    amlodipine  (NORVASC ) 10 MG tablet; Take 1 tablet (10 mg total) by mouth daily.    levothyroxine  (SYNTHROID ) 300 MCG tablet; Take 1 tablet (300 mcg total) by mouth daily.    gabapentin  (NEURONTIN ) 300 MG capsule; Take 1 capsule (300 mg total) by mouth two (2) times a day.

## 2023-11-20 NOTE — Unmapped (Signed)
 Called [facility]'s care manager again to follow up on FL2 request. Left VM with call back

## 2023-11-20 NOTE — Unmapped (Incomplete)
 Call [facility] for medication refills:  [phone] (option #4)    Call to refill the following medications (if [facility] can't fill them):  Losartan -HCTZ  Fluoxetine   Spironolactone   Pioglitazone  30 mg tablets    Schedule eye exam at [facility] eye center:  [facility] [address]  Phone number: [phone]    Call [facility] dermatology to schedule a follow-up visit: [phone]     Diabetes medications:  Lantus  insulin  24 units once daily - INCREASE!  Metformin  XR 2 tablets (1000 mg) once daily  Pioglitazone  1 tablet (15 mg) once daily until supply finished, THEN INCREASE to 30 mg daily    How often and when to check blood sugar:  Use Dexcom G7 continuous glucose monitor (CGM) as instructed. Change sensors every 10 days.    How to treat low blood sugar:  For blood sugar less than 70 --> Treat with 4 ounces of juice or regular soda, or with 3 to 4 glucose tablets. Re-check blood sugar in 15 minutes.    If blood sugar is still less than 70 on re-check, treat again and re-check in 15 minutes.   If blood sugar is over 70 on re-check and it is time to eat your regular meal, eat regular meal and take insulin  as prescribed for that blood sugar.    When to follow-up:  Wednesday, August 27th at 10am with Dr. Marland  Wednesday, September 10th at 10:30am with Therisa (pharmacist)      Therisa Crisp, PharmD, CPP, [facility]  Family Medicine Clinical Pharmacist  Clinic Phone: [phone]    *Add these medications to the rest of the pillbox for this week*  Spironolactone : Sunday through Wednesday  Losartan -HCTZ: Monday through Wednesday  Fluoxetine : Sunday through Saturday  Trazodone : Sunday through Tuesday (evening slot)            Name of Medication Morning Evening What It's For   Amlodipine   10 mg tablet 1 tablet  High blood pressure   Atorvastatin   80 mg tablet  1 tablet High cholesterol  Prevent future heart attack and stroke   Carvedilol   6.25 mg tablet 1 tablet 1 tablet High blood pressure   Clindamycin   300 mg capsule 1 capsule 1 capsule Skin infection   Levothyroxine   300 mcg tablet 1 tablet  Thyroid supplement   Losartan -HCTZ  100-25 mg tablet 1 tablet  High blood pressure   Lantus  insulin  Inject 24 units once daily. Diabetes   Metformin  XR  500 mg tablet 2 tablets  Diabetes   Pioglitazone   15 mg tablet 1 tablet  Diabetes  *increase to 30 mg with next refill*   Spironolactone   25 mg tablet 1 tablet  High blood pressure   Gabapentin   300 mg capsule 1 capsule 1 capsule Cancer-related pain   Fluoxetine   10 mg capsule 1 capsule  Depression   Trazodone   50 mg tablet  1 tablet Sleep and mood   Viktrakvi (larotrectinib )  100 mg capsule 1 capsule 1 capsule Thyroid cancer   Bimzelx  injection Inject 2 pens (320 mg) every 2 weeks for 16 weeks. Hidradenitis suppurativa   As Needed Medications   Hydroxyzine   25 mg tablet Take 1 tablet (25 mg) by mouth every 8 hours as needed. Itching   Ibuprofen   600 mg tablet Take 1 tablet (600 mg) by mouth twice daily as needed. Pain   Gabapentin   300 mg capsule Take 1 capsule (300 mg) by mouth twice daily. Pain   Nicotine   21 mg/24 hour patch Place 1 patch on the skin daily. Decrease cigarette cravings   Nicotine   4 mg gum Chew and park 1 piece of gum in cheek every 1 hour as needed. Cigarette cravings

## 2023-11-20 NOTE — Unmapped (Unsigned)
 Subjective     Reason for visit:    Paul Hines is a 53 y.o. 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  Patient presents to this visit alone.    Known DM Complications: peripheral neuropathy    Date of Last Diabetes Related Visit: 07/19/23 with CPP, 07/27/23 with PCP    Action At Last Diabetes Related Visit:    Stop Humalog   Start pioglitazone  15 mg daily  Increase Lantus  from 20 to 24 units daily  At PCP visit, pt reports holding insulin  per oncology in s/o poor PO intake which is being treated w/ Zyprexa ; given elevated BG readings PCP recommended taking half dose of insulin  on days when he is still eating a little  Continue metformin  XR 1000 mg daily (per pt preference)  Reviewed Dexcom G7 CGM sensor application during visit and pt successfully replaced sensor during CPP visit  Restart carvedilol  6.25 mg BID  Continue amlodipine  10 mg daily  Continue losartan -HCTZ 100-25 mg daily  Continue spironolactone  25 mg daily  Assisted with refilling weekly pillbox during CPP visit and corrected multiple discrepancies  Provided pt with phone # to call [facility] ophtho for scheduling eye exam  Strongly considering transitioning to pharmacy-provided weekly pill packs but given pt recently filled several meds anticipate refills will not align until ~Aug or Sept 2025    Since Last visit / History of Present Illness:    History of Present Illness  Continues experiencing issues with medication management, particularly with trazodone  for sleep, fluoxetine  for depression, and naltrexone for alcohol  cravings. He does not think he has any trazodone  or fluoxetine , and he no longer uses naltrexone as he does not experience alcohol  cravings anymore.    Brought all pill bottles and pillbox to today's visit but admits he has not been using pillbox the past couple of weeks and instead just taking meds directly from pill bottles.    Confirms starting pioglitazone  but did not have filled until ~2 weeks ago and continues taking decreased dose of metformin  1000 mg in AM only. Not taking increased dose of insulin  as he does not recall this plan and continues taking 20 units daily. Feels BG levels have been slightly higher than before.    Has empty bottle of losartan -HCTZ and only 3 pills left in bottle of spironolactone .    Believes his cancer pill (Viktrakvi) was filled in pillbox incorrectly at last CPP visit and was only in 1 slot per day rather than BID. But he is aware he should take BID and has been taking it as prescribed.    He has been receiving assistance from a home health nurse for medication management. Now open to making the transition to a different pharmacy that provides pill packs. Main concern is that he is not always able to pay medication copays at the time of fills but [facility] pharmacy has always still sent him meds.    Reported HTN Regimen:  Amlodipine  10 mg daily  Losartan -HCTZ 100-25 mg daily  Spironolactone  25 mg daily  Carvedilol  6.25 mg BID    Reported DM Regimen:    Metformin  XR 1000 mg daily  Lantus  20 units daily  Pioglitazone  15 mg daily    DM medications tried in the past:   Glipizide  Insulin  NPH    Medication Adherence and Access:  Since last visit,  patient reports missing doses of meds occasionally. Brought all pill bottles and weekly pillbox w/ AM/PM slots but has not been using recently as noted above.    Discrepancies with pill bottles and pillbox:  Empty bottle of losartan -HCTZ - only filled in 3 days in pillbox  Empty bottle of fluoxetine  - none in pillbox  Several meds not in pillbox at all: spironolactone , carvedilol , levothyroxine , pioglitazone   Takes gabapentin  as needed and does not want in pillbox    Still open to pill packs in the future, although just recently picked up 90-day supply of several Rx's so will not be able to refill meds for a couple of months from now.    SMBG per CGM report:  Using Dexcom G7 CGM as instructed.    For past ~2 weeks:  Average: 238  CMI: 9.0%  Very high: 40%  High: 40%  In range: 20%  Low: 0%  Very low: 0%    Hypoglycemia:    Symptoms of hypoglycemia since last visit:  no  If yes, it was treated by: n/a    DM Prevention:  Statin: Taking; high intensity (atorvastatin  80 mg)  Aspirin - unclear if indicated; Not taking    ACEI/ARB - Taking (losartan  100 mg daily - in combo pill w/ HCTZ 25 mg); Urine MA/CR Ratio - elevated urinary albumin excretion of 33.2 ug/g (last checked 10/19/22).  Last eye exam: unknown - DUE  Last foot exam: 10/19/22   Tobacco Use: Current smoker  Immunizations:   Immunization History   Administered Date(s) Administered    COVID-19 VAC,BIVALENT,MODERNA(BLUE CAP) 11/09/2020    COVID-19 VACCINE,MRNA(MODERNA)(PF) 03/06/2019, 04/03/2019, 12/18/2019, 06/08/2020    Covid-19 Vac, (22yr+) (Comirnaty) Mrna Pfizer  10/19/2022    DTaP, Unspecified Formulation 12/13/1971, 02/09/1972, 04/26/1972, 10/03/1973    INFLUENZA VACCINE IIV3(IM)(PF)6 MOS UP 10/19/2022    Influenza Vaccine Quad(IM)6 MO-Adult(PF) 12/02/2020    Influenza Virus Vaccine, unspecified formulation 11/03/2020    Measles 12/13/1971    Mumps 03/28/1974    Pneumococcal Conjugate 20-valent 11/03/2022    Polio Virus Vaccine, Unspecified Formulation 12/13/1971, 02/09/1972, 04/26/1972, 10/03/1973    Rubella 12/13/1971    SHINGRIX-ZOSTER VACCINE (HZV),RECOMBINANT,ADJUVANTED(IM) 11/03/2022, 03/14/2023    TdaP 11/03/2022     _________________________________________________    Past Medical History: reviewed PMH in epic today    Social History:  Social History     Tobacco Use   Smoking Status Every Day    Types: Cigarettes    Passive exposure: Current   Smokeless Tobacco Never       Medications: Medications reviewed in EPIC medication station and updated today by the clinical Insurance underwriter.         Objective   Vitals:    There were no vitals filed for this visit.     Wt Readings from Last 3 Encounters:   11/16/23 81.3 kg (179 lb 3.2 oz)   10/16/23 81 kg (178 lb 9.6 oz)   09/05/23 79.4 kg (175 lb)       There is no height or weight on file to calculate BMI.    The 10-year ASCVD risk score (Arnett DK, et al., 2019) is: 43.3%    Values used to calculate the score:      Age: 52 years      Clinically relevant sex: Male      Is Non-Hispanic African American: Yes      Diabetic: Yes      Tobacco smoker: Yes      Systolic Blood Pressure: 173 mmHg  Is BP treated: Yes      HDL Cholesterol: 87 mg/dL      Total Cholesterol: 191 mg/dL    Note: For patients with SBP <90 or >200, Total Cholesterol <130 or >320, HDL <20 or >100 which are outside of the allowable range, the calculator will use these upper or lower values to calculate the patient???s risk score.      Labs:   Lab Results   Component Value Date    A1C 9.1 (H) 03/14/2023    A1C >14.0 (H) 10/19/2022    A1C 11.4 (H) 06/20/2022     Per CareEverywhere: 15.1% (06/08/23)      Assessment/Plan:    1. Diabetes, type 2: uncontrolled per last A1c 15.1% (06/08/23 per CareEverywhere), which is increased from 9.1% in Feb 2025 and attributed to unstable housing for the past several months. Goal <7% per ADA guidelines. Per 14-day CGM report, time in range of 20% is decreased from last time and remains below goal >70% despite initiation of pioglitazone . However, pioglitazone /TZDs known to take weeks to see full effect on BG lowering and has only been taking ~2 weeks so hopeful we will see improvements in the coming weeks and will plan to titrate to 30 mg dose once pt finishes current 30-day supply of 15 mg. Again reviewed recommendation to titrate basal insulin  as well.  Continue pioglitazone  15 mg daily until 30-day supply finished, then increase to 30 mg daily - sent new Rx for 30 mg tabs today  Increase Lantus  insulin  from 20 to 24 units once daily   Continue metformin  XR 1000 mg daily  Repeat A1c due 09/08/23 - plan to check at next clinic visit  Reviewed symptoms and treatment of hypoglycemia, including need to check BG prior to and following treatment.  SMBG instructions: use Dexcom G7 CGM (including receiver) as instructed (change sensors every 10 days)  Future considerations:  Titrate metformin  to target dose of 2000 mg daily as tolerated - pt has preferred to stay at 1000 mg daily at recent visits  Titrate pioglitazone  to max of 45 mg daily as tolerated  Caution w/ SGLT2i given recent SSTI in inguinal region and uncontrolled HS affecting inguinal region (also potentially higher risk of GU infections with significantly elevated A1c)  Could still consider GLP-1 RA given non-medullary thyroid carcinomas not a contraindication and have unclear association - also potential to improve CKD/CV-related outcomes given microalbuminuria present (if SGLT2i not pursued)    2. Hypertension: uncontrolled based on today's clinic BP of 151/93 (HR 65), although does not appear pt has been taking losartan -HCTZ due to pill bottle being empty. Goal <130/80 mmHg per ADA guidelines. Encouraged pt to refill losartan -HCTZ as well as spironolactone  (only 3 pills left in bottle) and otherwise will not make any changes to med regimen today.  Continue amlodipine  10 mg daily  Continue losartan -HCTZ 100 mg daily - pt to call for refill ASAP  Continue spironolactone  25 mg daily - pt to call for refill  Continue carvedilol  6.25 mg BID  Future considerations:  Longer-acting ARB (e.g. irbesartan, telmisartan, olmesartan) and/or thiazide (e.g. chlorthalidone or indapamide) if concern for wearing off effect    3. Non-obstructive CAD - ASCVD risk reduction: last LDL elevated at 145 (10/19/22). Goal LDL <70 given DM + ASCVD risk >20% (as well as notable history of non-obstructive CAD). Appropriately on high intensity statin, though unclear adherence. May need additional non-statin LDL lowering therapies in the future but agreed to defer for now with focus on  consistent adherence to current statin regimen. Also previously discussed smoking cessation and encouraged pt to use nicotine  patch + gum more consistently and may revisit other options such as bupropion and Chantix with future follow-up.  Continue atorvastatin  80 mg daily  Continue nicotine  21 mg/24h patch + nicotine  4 mg gum PRN cravings  Future considerations:  Add PO ezetimibe if LDL remains above goal despite confirmed adherence w/ max dose statin  Trial bupropion or Chantix for smoking cessation if pt prefers alternatives to NRT - bupropion may provide comorbid benefit w/ depression    4. Medication management and care coordination:  Assisted pt will filling weekly pillbox today and encouraged pt to resume using pillbox to help keep track of med adherence rather than remembering to take directly from pill bottles. Did not have adequate supply of all meds to fill entire pillbox, so instructed pt to add the following meds to pillbox once refilled:  Spironolactone : Sunday through Wednesday  Losartan -HCTZ: Monday through Wednesday  Fluoxetine : Sunday through Saturday  Trazodone : Sunday through Tuesday (evening slot)  Given continued concerns with med adherence and management, strongly considering pursuing pill packs and agreed to investigate after today's visit. However, anticipate may still be too soon to align all Rx's since he had several 90-day Rx fills in early June. Sent all Rx's (for daily pills) to [facility] to start to coordinate setting up pill packs and previously confirmed they can deliver to pt at no additional charge.  For now, will keep Viktrakvi specialty medication + all other meds that will not be filled in pill pack (e.g. Lantus  insulin , Dexcom CGM sensors, PRN meds, etc.) w/ [facility].  Updated and printed med action plan for patient that includes names of all meds, dosing, and indications as listed below:  Name of Medication Morning Evening What It's For   Amlodipine   10 mg tablet 1 tablet  High blood pressure   Atorvastatin   80 mg tablet  1 tablet High cholesterol  Prevent future heart attack and stroke   Carvedilol   6.25 mg tablet 1 tablet 1 tablet High blood pressure   Clindamycin   300 mg capsule 1 capsule 1 capsule Skin infection   Levothyroxine   300 mcg tablet 1 tablet  Thyroid supplement   Losartan -HCTZ  100-25 mg tablet 1 tablet  High blood pressure   Lantus  insulin  Inject 24 units once daily. Diabetes   Metformin  XR  500 mg tablet 2 tablets  Diabetes   Pioglitazone   15 mg tablet 1 tablet  Diabetes  *increase to 30 mg with next refill*   Spironolactone   25 mg tablet 1 tablet  High blood pressure   Gabapentin   300 mg capsule 1 capsule 1 capsule Cancer-related pain   Fluoxetine   10 mg capsule 1 capsule  Depression   Trazodone   50 mg tablet  1 tablet Sleep and mood   Viktrakvi (larotrectinib )  100 mg capsule 1 capsule 1 capsule Thyroid cancer   Bimzelx  injection Inject 2 pens (320 mg) every 2 weeks for 16 weeks. Hidradenitis suppurativa   As Needed Medications   Hydroxyzine   25 mg tablet Take 1 tablet (25 mg) by mouth every 8 hours as needed. Itching   Ibuprofen   600 mg tablet Take 1 tablet (600 mg) by mouth twice daily as needed. Pain   Gabapentin   300 mg capsule Take 1 capsule (300 mg) by mouth twice daily. Pain   Nicotine   21 mg/24 hour patch Place 1 patch on the skin daily. Decrease cigarette  cravings   Nicotine   4 mg gum Chew and park 1 piece of gum in cheek every 1 hour as needed. Cigarette cravings       Follow-up: ~1 month with PCP and ~2-3 weeks after that with CPP  Note: prefers to continue seeing Dr. Albertina as PCP but no available slots for the next several months so will keep appt w/ Dr. Marland in August    Future Appointments   Date Time Provider Department Center   11/22/2023  9:00 AM Maryhelen Therisa Reusing, CPP [facility]   12/03/2023 10:25 AM Jones, Ke'Yon K, DO [facility]   01/28/2024  3:10 PM Betha Romero [place], MD [facility] I spent a total of 40 minutes face to face with the patient delivering clinical care and providing education/counseling.  _________________________________________________  Therisa Maryhelen, PharmD, CPP, [facility]  Family Medicine Clinical Pharmacist

## 2023-11-28 NOTE — Telephone Encounter (Signed)
 Copied from CRM #[id number]. Topic: Access To Clinicians - Req Clinic Call Back  >> Nov 28, 2023  2:00 PM German G wrote:  Reason of the Call: Medication    Requesting: A call back    Supporting details:The patient wishes to consult with their primary care physician regarding their thyroid cancer medication, which they are unable to identify and require a refill for.     As the patient is currently residing in a rehabilitation center, the most effective way to facilitate this communication is through Norleen, the care coordinator at [facility] in [facility], who can be reached at [phone].     The medication refill can be processed through [facility], located at [address], [address], [place]  [address], with a contact number of [phone] and fax number of [phone].    Appointment: The patient was last seen on 11/16/23.      Does the caller want to be contacted regarding this request? Yes. Please contact John by [phone]    Routine callback turnaround time: 24-48 business hours. Programmer, Systems Notified)

## 2023-11-29 DIAGNOSIS — C73 Malignant neoplasm of thyroid gland: Principal | ICD-10-CM

## 2023-11-29 NOTE — Telephone Encounter (Signed)
 Called Care Coordinator Paul Hines to clarify what thyroid cancer medication.     He has his levothyroxine  but notes he does not have his cancer medication which I believe is larotrectinib . He has missed several visits with his oncologist due to recent hospitalizations and would like to circle back with them if this medication should be continued so routing this request to the provider who last filled it and the RN navigator who has reached out to him most recently.

## 2023-12-04 NOTE — Telephone Encounter (Signed)
 I spoke with patient Paul Hines to confirm appointments on the following date(s): 11/12    [facility]

## 2023-12-07 ENCOUNTER — Inpatient Hospital Stay: Admit: 2023-12-07 | Discharge: 2023-12-08 | Payer: Medicaid (Managed Care)

## 2023-12-07 DIAGNOSIS — C73 Malignant neoplasm of thyroid gland: Principal | ICD-10-CM

## 2023-12-11 NOTE — Progress Notes (Signed)
 [facility] has made a second and final attempt to reach this patient to refill the following medication: Vitrakvi .      We have been unable to leave messages on the following phone numbers: [phone], and [phone] and have sent a MyChart message. Text messages would not go through.     Dates contacted: 11/4, 11/11  Last scheduled delivery: shipped 8/7    The patient may be at risk of non-compliance with this medication. The patient should call the [facility] at [phone]  Option 4, then Option 1: Oncology to refill medication.    Paul Hines   [facility] Oncologist

## 2023-12-11 NOTE — Progress Notes (Unsigned)
 Subjective     Reason for visit:    Paul Hines is a 53 y.o. 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  Patient presents to this visit alone.    Known DM Complications: peripheral neuropathy    Date of Last Diabetes Related Visit: 11/16/23 with PCP    Action At Last Diabetes Related Visit:    Pt not taking any of his meds due to leaving at the shelter and does not feel comfortable going back to retrieve them  Stop HCTZ + spironolactone  per last hospital discharge summary  Increase carvedilol  to 25 mg BID  Continue amlodipine  10 mg daily  Resume pioglitazone  30 mg daily  Resume Lantus  24 units daily  Resume metformin  XR 1000 mg daily  Provided pt with phone number to [facility] pharmacy to call and request pill packs refills    Since Last visit / History of Present Illness:    History of Present Illness  Paul Hines has been without his medications for approximately two months after leaving his previous living situation and moving to the [employer]. He feels tired all the time and sleeps excessively. He also lacks access to his insulin  and other medications, including his chemotherapy medication.    He recalls receiving a supply of insulin  pens, approximately five pens, which he believes was intended to last more than a month but left insulin  supply with all of his other medications at the place he was staying at previously. He was previously administering around 20 units of insulin  daily but currently has no access to insulin  or a glucose monitoring device. His blood sugar was noted to be high, with a recent lab at his oncology visit showing a glucose level of 414 mg/dL.    He has not received his chemotherapy medication, which he indicates is overdue, because his oncologist is still working to determine how to get the medication delivered to him. He is concerned about the potential progression of his cancer without this medication.    He reports a sore on the back of his foot, which he believes may have resulted from dryness and cracking. He has been applying lotion but is concerned about infection.    No chest pain, left-sided weakness, or blurry vision, but he feels consistently tired. He also mentions difficulty urinating, requiring effort to void.    He is currently staying at the [facility] and has been working there. He mentions not having a stable place for the past few years.    Prescribed HTN Regimen:  Amlodipine  10 mg daily  Carvedilol  25 mg BID    Prescribed DM Regimen:    Metformin  XR 1000 mg daily  Lantus  20 units daily  Pioglitazone  30 mg daily    DM medications tried in the past:   Glipizide  Insulin  NPH    Medication Adherence and Access:  Since last visit, patient reports not taking any medications for the past ~2 months as noted above.    Previously received all PO medications (except chemo) in pill packs from [facility] until multiple hospitalizations and needing to get meds filled from other pharmacies at discharge.    SMBG N/A:  Does not have CGM or glucometer to check glucose recently. Previously had Dexcom G7 CGM Receiver + Sensors but left w/ all his other meds  at last place he was staying so no longer has access to it.    Hypoglycemia:    Symptoms of hypoglycemia since last visit:  no  If yes, it was treated by: n/a    DM Prevention:  Statin: Taking; high intensity (atorvastatin  80 mg)  Aspirin: unclear if indicated; Not taking    ACEI/ARB: Not taking (losartan  held since previous hospital discharge due to concern for contributing to angioedema); Urine MA/CR Ratio - elevated urinary albumin excretion of 33.2 ug/g (last checked 10/19/22 - [facility]).  Last eye exam: unknown - [facility]  Last foot exam: 10/19/22   Tobacco Use: Current smoker  Immunizations:   Immunization History Administered Date(s) Administered    COVID-19 VAC,BIVALENT,MODERNA(BLUE CAP) 11/09/2020    COVID-19 VACCINE,MRNA(MODERNA)(PF) 03/06/2019, 04/03/2019, 12/18/2019, 06/08/2020    Covid-19 Vac, (10yr+) (Comirnaty) Mrna Pfizer  10/19/2022    DTaP, Unspecified Formulation 12/13/1971, 02/09/1972, 04/26/1972, 10/03/1973    INFLUENZA VACCINE IIV3(IM)(PF)6 MOS UP 10/19/2022    Influenza Vaccine Quad(IM)6 MO-Adult(PF) 12/02/2020    Influenza Virus Vaccine, unspecified formulation 11/03/2020    Measles 12/13/1971    Mumps 03/28/1974    Pneumococcal Conjugate 20-valent 11/03/2022    Polio Virus Vaccine, Unspecified Formulation 12/13/1971, 02/09/1972, 04/26/1972, 10/03/1973    Rubella 12/13/1971    SHINGRIX-ZOSTER VACCINE (HZV),RECOMBINANT,ADJUVANTED(IM) 11/03/2022, 03/14/2023    TdaP 11/03/2022     _________________________________________________    Past Medical History: reviewed PMH in epic today    Social History:  Social History     Tobacco Use   Smoking Status Every Day    Types: Cigarettes    Passive exposure: Current   Smokeless Tobacco Never       Medications: Medications reviewed in EPIC medication station and updated today by the clinical insurance underwriter.         Objective   Vitals:    Vitals:    12/13/23 1018   BP: 174/106   Pulse: 90        Wt Readings from Last 3 Encounters:   11/16/23 81.3 kg (179 lb 3.2 oz)   10/16/23 81 kg (178 lb 9.6 oz)   09/05/23 79.4 kg (175 lb)       There is no height or weight on file to calculate BMI.    The 10-year ASCVD risk score (Arnett DK, et al., 2019) is: 45.9%    Values used to calculate the score:      Age: 69 years      Clinically relevant sex: Male      Is Non-Hispanic African American: Yes      Diabetic: Yes      Tobacco smoker: Yes      Systolic Blood Pressure: 174 mmHg      Is BP treated: Yes      HDL Cholesterol: 105 mg/dL      Total Cholesterol: 288 mg/dL    Note: For patients with SBP <90 or >200, Total Cholesterol <130 or >320, HDL <20 or >100 which are outside of the allowable range, the calculator will use these upper or lower values to calculate the patient???s risk score.      Labs:   Lab Results   Component Value Date    A1C 13.5 (H) 12/12/2023    A1C 9.1 (H) 03/14/2023    A1C >14.0 (H) 10/19/2022       Assessment/Plan:    1. Diabetes, type 2: uncontrolled per A1c 13.5% today, largely due to being off all medications for the past couple of months due to  unstable housing. Goal <7% per ADA guidelines. Able to work with [employer] to get new month supply of all PO meds in pill packs + insulin  refill set to deliver tomorrow (11/14) so pt can resume prescribed DM med regimen. Unfortunately, pt's smart phone is not compatible w/ either Libre or Dexcom apps so unlikely to be able to use CGM unless able to get him a physical reader/receiver affordably.  Resume pioglitazone  30 mg daily  Resume Lantus  insulin  24 units once daily   Resume metformin  XR 1000 mg daily  Repeat A1c due 03/14/24  Reviewed symptoms and treatment of hypoglycemia, including need to check BG prior to and following treatment.  SMBG instructions: once daily fasting (if able to get access to BG meter + testing supplies again in the future)  Resume use of CGM if able to get reader/receiver at an affordable cost (anticipate Medicaid will not cover another meter/reader since pt has had one filled in the past 5 years) - sent Rx's for Dexcom G7 Receiver + Sensors to see if pharmacy can fill  Future considerations:  Titrate metformin  to target dose of 2000 mg daily as tolerated - pt has preferred to stay at 1000 mg daily at recent visits  Titrate pioglitazone  to max of 45 mg daily as tolerated  Caution w/ SGLT2i given recent SSTI in inguinal region and uncontrolled HS affecting inguinal region (also potentially higher risk of GU infections with significantly elevated A1c)  Could still consider GLP-1 RA given non-medullary thyroid carcinomas not a contraindication and have unclear association - also potential to improve CKD/CV-related outcomes given microalbuminuria present (if SGLT2i not pursued)    2. Hypertension: uncontrolled based on significantly elevated clinic BP today of 174/106, although again due to pt not taking any meds for several weeks/months and reassuring that pt denies s/sx hypertensive emergency. Goal <130/80 mmHg per ADA guidelines. Recommended pt resume all HTN meds as prescribed w/ new pill packs coming this week.  Resume amlodipine  10 mg daily  Resume carvedilol  25 mg BID  Future considerations:  Caution w/ resuming ARB due to concern this may have contributed to recent angioedema episode  Resume thiazide diuretic as indicated and as long as Scr/electrolytes stable (hyponatremia during last hospitalization) and able to adequately hydrate    3. Non-obstructive CAD - ASCVD risk reduction: last LDL elevated at 154 (12/12/23). Goal LDL <70 given DM + ASCVD risk >20% (as well as notable history of non-obstructive CAD). Appropriately on high intensity statin, though continued adherence concerns. May need additional non-statin LDL lowering therapies in the future but will defer for now with focus on consistent adherence to current statin regimen. Also previously discussed smoking cessation and encouraged pt to use nicotine  patch + gum more consistently and getting pt refills on these as well this week.  Resume atorvastatin  80 mg daily  Resume nicotine  21 mg/24h patch + nicotine  4 mg gum PRN cravings  Future considerations:  Add PO ezetimibe if LDL remains above goal despite confirmed adherence w/ max dose statin  Trial bupropion or Chantix for smoking cessation if pt prefers alternatives to NRT - bupropion may provide comorbid benefit w/ depression    4. Medication management and care coordination:  [facility] pharmacy during today's visit and confirmed they are able to submit lost medication override (pt had several Rx's filled at [place] pharmacy less than 30 days ago on 11/26/23 but has since lost all meds) and insurance seems to be approving all Rx refills today. [place]  pharmacy will work on filling all PO meds (except Vitrakvi ) in pill packs to be delivered to [facility] tomorrow (11/14).  Clarified w/ [facility] pharmacy that main issue w/ filling Vitrakvi  is that they need a phone number to call before sending any Rx fills. Confirmed pt has current phone number (as listed in Epic) and added [facility] pharmacy phone number to pt's contacts and emphasized that he needs to answer the call from the pharmacy for them to set up delivery of his chemo medication, to which he agrees to do. Anticipate [facility] pharmacy will call pt later this afternoon to set up next delivery of his chemo.  Recommended pt make appointment in urgent care to evaluate sore on back of his foot/heel if concerned for infection.  Will also message PCP to see if she is okay to prescribe refill of triamcinolone  ointment for itching per pt request today.      Follow-up: ~1 week with PCP and ~1 month with CPP    Future Appointments   Date Time Provider Department Center   12/21/2023  8:10 AM Albertina Schuyler [place], MD [facility]   01/10/2024  8:30 AM Maryhelen Therisa Reusing, CPP [facility]   01/16/2024  3:15 PM ADULT ONC LAB [facility]   01/16/2024  4:20 PM Sheth, Siddharth Hemant, DO [facility]   01/28/2024  3:10 PM Betha Romero [place], MD [facility]       I spent a total of 40 minutes face to face with the patient delivering clinical care and providing education/counseling.  _________________________________________________  Therisa Maryhelen, PharmD, CPP, [facility]  Family Medicine Clinical Pharmacist sensors, PRN meds, etc.) w/ [facility].  Updated and printed med action plan for patient that includes names of all meds, dosing, and indications as listed below:  Name of Medication Morning Evening What It's For   Amlodipine   10 mg tablet 1 tablet  High blood pressure   Atorvastatin   80 mg tablet  1 tablet High cholesterol  Prevent future heart attack and stroke   Carvedilol   6.25 mg tablet 1 tablet 1 tablet High blood pressure   Clindamycin   300 mg capsule 1 capsule 1 capsule Skin infection   Levothyroxine   300 mcg tablet 1 tablet  Thyroid supplement   Losartan -HCTZ  100-25 mg tablet 1 tablet  High blood pressure   Lantus  insulin  Inject 24 units once daily. Diabetes   Metformin  XR  500 mg tablet 2 tablets  Diabetes   Pioglitazone   15 mg tablet 1 tablet  Diabetes  *increase to 30 mg with next refill*   Spironolactone   25 mg tablet 1 tablet  High blood pressure   Gabapentin   300 mg capsule 1 capsule 1 capsule Cancer-related pain   Fluoxetine   10 mg capsule 1 capsule  Depression   Trazodone   50 mg tablet  1 tablet Sleep and mood   Viktrakvi (larotrectinib )  100 mg capsule 1 capsule 1 capsule Thyroid cancer   Bimzelx  injection Inject 2 pens (320 mg) every 2 weeks for 16 weeks. Hidradenitis suppurativa   As Needed Medications   Hydroxyzine   25 mg tablet Take 1 tablet (25 mg) by mouth every 8 hours as needed. Itching   Ibuprofen   600 mg tablet Take 1 tablet (600 mg) by mouth twice daily as needed. Pain   Gabapentin   300 mg capsule Take 1 capsule (300 mg) by mouth twice daily. Pain   Nicotine   21 mg/24 hour patch Place  1 patch on the skin daily. Decrease cigarette cravings   Nicotine   4 mg gum Chew and park 1 piece of gum in cheek every 1 hour as needed. Cigarette cravings       Follow-up: ~1 month with PCP and ~2-3 weeks after that with CPP  Note: prefers to continue seeing Dr. Albertina as PCP but no available slots for the next several months so will keep appt w/ Dr. Marland in August    Future Appointments   Date Time Provider Department Center   12/12/2023 10:45 AM ADULT ONC LAB [facility]   12/12/2023 11:40 AM Sheth, Siddharth Hemant, DO [facility]   12/13/2023 10:00 AM Maryhelen Therisa Reusing, CPP [facility]   01/28/2024  3:10 PM Betha Romero [place], MD [facility]       I spent a total of 40 minutes face to face with the patient delivering clinical care and providing education/counseling.  _________________________________________________  Therisa Maryhelen, PharmD, CPP, [facility]  Family Medicine Clinical Pharmacist

## 2023-12-11 NOTE — Patient Instructions (Incomplete)
 Schedule eye exam at [facility] eye center:  [facility] OPHTHALMOLOGY [address]  Phone number: [phone]    Call [facility] dermatology to schedule a follow-up visit: [phone]     Take everything in your pill packs PLUS:  Lantus  insulin  24 units daily  Vitrakvi  (larotrectinib ) 1 capsule (100 mg) twice daily  Nicotine  patches and gum    How often and when to check blood sugar:  Use Dexcom G7 continuous glucose monitor (CGM) as instructed. Change sensors every 10 days.    How to treat low blood sugar:  For blood sugar less than 70 --> Treat with 4 ounces of juice or regular soda, or with 3 to 4 glucose tablets. Re-check blood sugar in 15 minutes.    If blood sugar is still less than 70 on re-check, treat again and re-check in 15 minutes.   If blood sugar is over 70 on re-check and it is time to eat your regular meal, eat regular meal and take insulin  as prescribed for that blood sugar.    When to follow-up:  Friday, November 21st at 8:10am with Dr. Albertina  Thursday, December 11th at 8:30am with Therisa (pharmacist)  Bring all your medications!      Therisa Crisp, PharmD, CPP, [facility]  Family Medicine Clinical Pharmacist  Clinic Phone: [phone] Trazodone   50 mg tablet  1 tablet Sleep and mood   Viktrakvi (larotrectinib )  100 mg capsule 1 capsule 1 capsule Thyroid cancer   Bimzelx  injection Inject 2 pens (320 mg) every 2 weeks for 16 weeks. Hidradenitis suppurativa   As Needed Medications   Hydroxyzine   25 mg tablet Take 1 tablet (25 mg) by mouth every 8 hours as needed. Itching   Ibuprofen   600 mg tablet Take 1 tablet (600 mg) by mouth twice daily as needed. Pain   Gabapentin   300 mg capsule Take 1 capsule (300 mg) by mouth twice daily. Pain   Nicotine   21 mg/24 hour patch Place 1 patch on the skin daily. Decrease cigarette cravings   Nicotine   4 mg gum Chew and park 1 piece of gum in cheek every 1 hour as needed. Cigarette cravings

## 2023-12-12 ENCOUNTER — Ambulatory Visit
Admit: 2023-12-12 | Discharge: 2023-12-12 | Payer: Medicaid (Managed Care) | Attending: Student in an Organized Health Care Education/Training Program | Primary: Student in an Organized Health Care Education/Training Program

## 2023-12-12 ENCOUNTER — Ambulatory Visit: Admit: 2023-12-12 | Discharge: 2023-12-12 | Payer: Medicaid (Managed Care)

## 2023-12-12 DIAGNOSIS — C73 Malignant neoplasm of thyroid gland: Principal | ICD-10-CM

## 2023-12-12 LAB — COMPREHENSIVE METABOLIC PANEL
ALBUMIN: 3.5 g/dL (ref 3.4–5.0)
ALKALINE PHOSPHATASE: 128 U/L — ABNORMAL HIGH (ref 46–116)
ALT (SGPT): 38 U/L (ref 10–49)
ANION GAP: 11 mmol/L (ref 5–14)
AST (SGOT): 34 U/L (ref <=34)
BILIRUBIN TOTAL: 0.3 mg/dL (ref 0.3–1.2)
BLOOD UREA NITROGEN: 16 mg/dL (ref 9–23)
BUN / CREAT RATIO: 16
CALCIUM: 9.5 mg/dL (ref 8.7–10.4)
CHLORIDE: 97 mmol/L — ABNORMAL LOW (ref 98–107)
CO2: 26 mmol/L (ref 20.0–31.0)
CREATININE: 1 mg/dL (ref 0.73–1.18)
EGFR CKD-EPI (2021) MALE: 89 mL/min/1.73m2 (ref >=60)
GLUCOSE RANDOM: 414 mg/dL (ref 70–179)
POTASSIUM: 3.9 mmol/L (ref 3.4–4.8)
PROTEIN TOTAL: 7.9 g/dL (ref 5.7–8.2)
SODIUM: 134 mmol/L — ABNORMAL LOW (ref 135–145)

## 2023-12-12 LAB — CBC W/ AUTO DIFF
BASOPHILS ABSOLUTE COUNT: 0.1 10*9/L (ref 0.0–0.1)
BASOPHILS RELATIVE PERCENT: 0.8 %
EOSINOPHILS ABSOLUTE COUNT: 0.1 10*9/L (ref 0.0–0.5)
EOSINOPHILS RELATIVE PERCENT: 1.6 %
HEMATOCRIT: 36.3 % — ABNORMAL LOW (ref 39.0–48.0)
HEMOGLOBIN: 11.6 g/dL — ABNORMAL LOW (ref 12.9–16.5)
LYMPHOCYTES ABSOLUTE COUNT: 1.6 10*9/L (ref 1.1–3.6)
LYMPHOCYTES RELATIVE PERCENT: 21.6 %
MEAN CORPUSCULAR HEMOGLOBIN CONC: 31.9 g/dL — ABNORMAL LOW (ref 32.0–36.0)
MEAN CORPUSCULAR HEMOGLOBIN: 22.7 pg — ABNORMAL LOW (ref 25.9–32.4)
MEAN CORPUSCULAR VOLUME: 71.1 fL — ABNORMAL LOW (ref 77.6–95.7)
MEAN PLATELET VOLUME: 8.6 fL (ref 6.8–10.7)
MONOCYTES ABSOLUTE COUNT: 0.5 10*9/L (ref 0.3–0.8)
MONOCYTES RELATIVE PERCENT: 7 %
NEUTROPHILS ABSOLUTE COUNT: 5 10*9/L (ref 1.8–7.8)
NEUTROPHILS RELATIVE PERCENT: 69 %
PLATELET COUNT: 264 10*9/L (ref 150–450)
RED BLOOD CELL COUNT: 5.11 10*12/L (ref 4.26–5.60)
RED CELL DISTRIBUTION WIDTH: 16.2 % — ABNORMAL HIGH (ref 12.2–15.2)
WBC ADJUSTED: 7.3 10*9/L (ref 3.6–11.2)

## 2023-12-12 LAB — TSH: THYROID STIMULATING HORMONE: 60.493 u[IU]/mL — ABNORMAL HIGH (ref 0.550–4.780)

## 2023-12-12 LAB — SLIDE REVIEW

## 2023-12-12 LAB — T4, FREE: FREE T4: 0.5 ng/dL — ABNORMAL LOW (ref 0.89–1.76)

## 2023-12-13 ENCOUNTER — Encounter
Admit: 2023-12-13 | Discharge: 2023-12-13 | Payer: Medicaid (Managed Care) | Attending: Ambulatory Care | Primary: Ambulatory Care

## 2023-12-13 LAB — ALBUMIN / CREATININE URINE RATIO
ALBUMIN QUANT URINE: 10.2 mg/dL
ALBUMIN/CREATININE RATIO: 275.7 ug/mg — ABNORMAL HIGH (ref 0.0–30.0)
CREATININE, URINE: 37 mg/dL

## 2023-12-13 LAB — LIPID PANEL
CHOLESTEROL: 288 mg/dL — ABNORMAL HIGH (ref <200)
HDL CHOLESTEROL: 105 mg/dL (ref >40)
LDL CHOLESTEROL CALCULATED: 154 mg/dL — ABNORMAL HIGH (ref <100)
NON-HDL CHOLESTEROL: 183 mg/dL — ABNORMAL HIGH (ref <130)
TRIGLYCERIDES: 164 mg/dL — ABNORMAL HIGH (ref <150)

## 2023-12-13 LAB — HEMOGLOBIN A1C
ESTIMATED AVERAGE GLUCOSE: 341 mg/dL
HEMOGLOBIN A1C: 13.5 % — ABNORMAL HIGH (ref 4.8–5.6)

## 2023-12-13 MED ORDER — DEXCOM G7 RECEIVER
0 refills | 0.00000 days | Status: CP
Start: 2023-12-13 — End: ?

## 2023-12-13 MED ORDER — NICOTINE 21 MG/24 HR DAILY TRANSDERMAL PATCH
MEDICATED_PATCH | TRANSDERMAL | 2 refills | 28.00000 days | Status: CP
Start: 2023-12-13 — End: ?

## 2023-12-13 MED ORDER — CLINDAMYCIN HCL 300 MG CAPSULE
ORAL_CAPSULE | Freq: Two times a day (BID) | ORAL | 0 refills | 30.00000 days | Status: CP
Start: 2023-12-13 — End: ?
  Filled 2024-02-29: qty 60, 30d supply, fill #1

## 2023-12-13 MED ORDER — NICOTINE (POLACRILEX) 4 MG GUM
BUCCAL | 2 refills | 5.00000 days | Status: CP | PRN
Start: 2023-12-13 — End: ?

## 2023-12-13 MED ORDER — DEXCOM G7 SENSOR DEVICE
3 refills | 0.00000 days | Status: CP
Start: 2023-12-13 — End: ?

## 2023-12-13 MED ORDER — LEVOTHYROXINE 300 MCG TABLET
ORAL_TABLET | Freq: Every day | ORAL | 11 refills | 30.00000 days | Status: CP
Start: 2023-12-13 — End: 2024-12-12

## 2023-12-13 MED ORDER — PIOGLITAZONE 30 MG TABLET
ORAL_TABLET | Freq: Every day | ORAL | 5 refills | 30.00000 days | Status: CP
Start: 2023-12-13 — End: ?

## 2023-12-13 MED ORDER — AMLODIPINE 10 MG TABLET
ORAL_TABLET | Freq: Every day | ORAL | 11 refills | 30.00000 days | Status: CP
Start: 2023-12-13 — End: ?

## 2023-12-13 MED ORDER — CARVEDILOL 25 MG TABLET
ORAL_TABLET | Freq: Two times a day (BID) | ORAL | 3 refills | 90.00000 days | Status: CP
Start: 2023-12-13 — End: 2024-12-12

## 2023-12-13 MED ORDER — ATORVASTATIN 80 MG TABLET
ORAL_TABLET | Freq: Every evening | ORAL | 11 refills | 30.00000 days | Status: CP
Start: 2023-12-13 — End: ?

## 2023-12-13 MED ORDER — PEN NEEDLE, DIABETIC 31 GAUGE X 5/16" (8 MM)
ORAL | 11 refills | 0.00000 days | Status: CP
Start: 2023-12-13 — End: 2024-12-12

## 2023-12-13 MED ORDER — INSULIN GLARGINE (U-100) 100 UNIT/ML (3 ML) SUBCUTANEOUS PEN
Freq: Every evening | SUBCUTANEOUS | 2 refills | 62.00000 days | Status: CP
Start: 2023-12-13 — End: ?

## 2023-12-13 MED ORDER — GABAPENTIN 300 MG CAPSULE
ORAL_CAPSULE | Freq: Two times a day (BID) | ORAL | 2 refills | 90.00000 days | Status: CP
Start: 2023-12-13 — End: ?

## 2023-12-13 MED ORDER — FLUOXETINE 10 MG CAPSULE
ORAL_CAPSULE | Freq: Every day | ORAL | 2 refills | 30.00000 days | Status: CP
Start: 2023-12-13 — End: ?

## 2023-12-13 MED ORDER — LORATADINE 10 MG TABLET
ORAL_TABLET | Freq: Two times a day (BID) | ORAL | 11 refills | 30.00000 days | Status: CP
Start: 2023-12-13 — End: ?

## 2023-12-13 MED ORDER — TRAZODONE 50 MG TABLET
ORAL_TABLET | Freq: Every evening | ORAL | 2 refills | 30.00000 days | Status: CP
Start: 2023-12-13 — End: ?

## 2023-12-13 MED ORDER — METFORMIN ER 500 MG TABLET,EXTENDED RELEASE 24 HR
ORAL_TABLET | Freq: Every day | ORAL | 2 refills | 30.00000 days | Status: CP
Start: 2023-12-13 — End: ?

## 2023-12-13 NOTE — Progress Notes (Signed)
 [facility] Refill Coordination Note    Specialty Medication(s) to be Shipped:   Hematology/Oncology: Vitrakvi     Other medication(s) to be shipped: No additional medications requested for fill at this time    Specialty Medications not needed at this time: N/A     Paul Hines, DOB: 1970/10/02  Phone: [phone] (home) [phone] (work)      All above HIPAA information was verified with patient.     Was a nurse, learning disability used for this call? No    Completed refill call assessment today to schedule patient's medication shipment from the [facility]  [phone]).  All relevant notes have been reviewed.     Specialty medication(s) and dose(s) confirmed: Regimen is correct and unchanged.   Changes to medications: low health literacy  Changes to insurance: No  New side effects reported not previously addressed with a pharmacist or physician: None reported  Questions for the pharmacist: No    Confirmed patient received a Conservation Officer, Historic Buildings and a Surveyor, Mining with first shipment. The patient will receive a drug information handout for each medication shipped and additional FDA Medication Guides as required.       DISEASE/MEDICATION-SPECIFIC INFORMATION        N/A    SPECIALTY MEDICATION ADHERENCE     Medication Adherence    Patient reported X missed doses in the last month: >5  Specialty Medication: Vitrakvi  100 mg  Patient is on additional specialty medications: No  Informant: patient  Reasons for non-adherence: no transportation, health literacy is low            Were doses missed due to medication being on hold? No    Vitrakvi  100 mg: 0 doses of medicine on hand       REFERRAL TO PHARMACIST     Referral to the pharmacist: Not needed      SHIPPING     Shipping address confirmed in Epic.     Cost and Payment: $4 - cannot afford copay    Delivery Scheduled: Yes, Expected medication delivery date: 12/17/23.     Medication will be delivered via Same Day Courier to the temporary address in Epic WAM.    Paul Hines CHRISTELLA Molt, PharmD   [facility]  Specialty Pharmacist

## 2023-12-13 NOTE — Progress Notes (Signed)
 Care Management Progress Note  [facility]                 Date of Service:  12/13/2023     Service: Care Management - face to face    Purpose of contact:        Discuss housing and transportation.     Interventions Provided:    CM met with patient and introduced self and role within the [facility] Medicine care team. CM conducted a psychosocial assessment to identify current barriers to care.    Patient expressed ongoing needs related to housing and transportation resources. During the discussion, patient shared that he are currently staying at the [facility], where he have resided for approximately one week.    Patient reported a history of incarceration, stating he were released from prison in 2022, after which [facility] the mission for approximately three [facility]. Patient later stayed at an [facility] and subsequently in a rooming house located in an area with high drug activity, which negatively impacted his mental health.    Patient has previously received mental health services at [facility], but discontinued treatment due to homelessness.    CM contacted [facility] at [phone] for coordination.  CM also connected with [facility] Manager, Loren Gay [phone]), who provides outreach and assertive engagement services. Ms. Gay reported that the patient is currently in the process of being connected with the Community Support Team (CST) at [facility] for continued support and case management that will help with housing and mental health support/resources.      Additional Information/Plan:  CM provided patient with direct contact information, and encouraged to contact CM should additional needs arise.        Blair Lundeen [facility] [place]   [facility]   [phone]

## 2023-12-14 NOTE — Progress Notes (Signed)
 [facility]  PCP: Paul Schuyler Antigua, MD    Consulting providers:  Otolaryngology: Dr. 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 man with history of poorly controlled T2DM, hypertension, ischemic cardiac disease s/p catheterization in 2021 and metastatic thyroid cancer, s/p resection with Dr. Jessee on 04/12/20 and neck dissection on 07/28/20. Noted to have metastatic disease with pulmonary involvement on PET/CT (01/14/2022). Treated with 1L Lenvima  18 mg daily (02/21/21-12/2022), discontinued due to TRAE and poor tolerance. Given TPR-NTRK1 chromosomal rearrangement, treating with 2L Larotrectinib . Presents for follow up.     # Metastatic Papillary Thyroid Cancer, BRAF WT  - Relevant oncologic history summarized above; treatment goals are palliative.   - Tg: 75 (02/17/2020)  --> 134 (08/08/2022) --> 46 (10/04/2022) --> 39 (11/03/2022) --> 45 (12/20/2022) --> 83 (02/14/2023) --> 28 (07/26/2023) --> 31 (09/05/2023) --> ** (12/12/2023)  - [facility] xT: TPR-NTRK1 chromosomal rearrangement  - CT Neck/Chest (11/15/2023; [facility]): personally reviewed; evidence of mild disease progression  - Has NOT been on Larotrectinib  for multiple months due to unstable housing situation  - Restart Larotrectinib ; same day appointment with Dr. Lars ([facility]) to discuss medications  - Re-image after 3 months on treatment  - RTC in 4-6 weeks for labs    # Hidradenitis suppurativa, worsening off treatment  - Followed by [facility]  - Has not been on treatment including bimekizumab  injections    # Hypertension, stable  - Needs to re-start amlodipine , coreg , hyzaar   - Encouraged medical compliance and frequent home checks  - Follow appt with PCP tomorrow; message sent    # Diabetes, poorly controlled  - A1c 13.2% (12/12/2023)  - Will need to re-start metformin  + Lantus    - Follow up PCP    # Housing instability  Currently staying at [facility]. This has impacted access to medications and treatment adherence.  - Coordinated with CPP to ensure accurate contact information for medication delivery.    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    He has a history of thyroid cancer with metastasis to the lungs and has not taken his cancer medication for several weeks due to housing instability and issues with receiving his medication. He is concerned about cancer progression and references a recent CT scan from October 25, 2023.    He has not been taking his diabetes medications, including insulin  and dexamethasone, for several weeks due to the same issues with medication access. He feels weak, tired, and 'pretty terrible' without his medications. He also has a sore on his foot that he fears might lead to more serious complications if not addressed.    He experiences shortness of breath and has visited the emergency room for this issue, attributing it to his untreated conditions. He also reports difficulty eating and feeling out of breath.    Socially, he is currently staying at a dialysis commission and  has been moving between different places, including hotels. He is not working but receives a check that helps with his expenses. He expresses a need for a stable living situation and has been trying to connect with programs to assist with housing.    History of Present Illness:  Paul Hines is a 53 y.o. male with history of T2DM, hypertension, ischemic heart disease s/p cardiac catheterization (07/2019) who presents for evaluation of head and neck cancer. I have reviewed his records including history, imaging, pathology reports, and, when applicable, operative notes and summarized his oncologic history in my original consult note.    Past Medical History:   Diagnosis Date    Abnormal findings on dx imaging of heart and cor circ     Acne vulgaris     Allergic contact dermatitis due to food in contact with skin     Atherosclerosis     Athscl heart disease of native coronary artery w/o ang pctrs     Chronic ischemic heart disease     Cutaneous abscess     Diabetic polyneuropathy (CMS-HCC)     Disorder of skin and subcutaneous tissue     Essential hypertension     Financial difficulties     H/O medication noncompliance     Hernia, inguinal, unilateral     Hypertrophic cardiomyopathy    (CMS-HCC)     Malignant neoplasm of thyroid gland    (CMS-HCC)     Presbyopia     Problem with transportation     Pseudofolliculitis barbae     Shoulder pain, bilateral     Tinea unguium     Type 2 diabetes mellitus (CMS-HCC)     Visual impairment     Xerosis cutis        Past Surgical History:   Procedure Laterality Date    PR BRNCHSC EBUS GUIDED SAMPL 1/2 NODE STATION/STRUX N/A 01/09/2020    Procedure: BRONCH, RIGID OR FLEXIBLE, INC FLUORO GUIDANCE, WHEN PERFORMED; WITH EBUS GUIDED TRANSTRACHEAL AND/OR TRANSBRONCHIAL SAMPLING, ONE OR TWO MEDIASTINAL AND/OR HILAR LYMPH NODE STATIONS OR STRUCTURES;  Surgeon: Selinda Janina Sermon, MD;  Location: MAIN OR [place];  Service: Pulmonary    PR BRNSCHSC TNDSC EBUS DX/TX INTERVENTION PERPH LES Right 01/09/2020    Procedure: BRONCH, RIGID OR FLEXIBLE, INCLUDING FLUORO GUIDANCE, WHEN PERFORMED; WITH TRANSENDOSCOPIC EBUS DURING BRONCHOSCOPIC DIAGNOSTIC OR THERAPEUTIC INTERVENTION(S) FOR PERIPHERAL LESION(S);  Surgeon: Selinda Janina Sermon, MD;  Location: MAIN OR [place];  Service: Pulmonary    PR BRONCHOSCOPY,COMPUTER ASSIST/IMAGE-GUIDED NAVIGATION Right 01/09/2020    Procedure: ROBOT ION BRONCHOSCOPY,RIGID OR FLEXIBLE,INCLUDE FLUORO WHEN PERFORMED; W/COMPUTER-ASSIST,IMAGE-GUIDED NAVIGATION;  Surgeon: Selinda Janina Sermon, MD;  Location: MAIN OR [place];  Service: Pulmonary    PR BRONCHOSCOPY,DIAGNOSTIC W LAVAGE Right 01/09/2020    Procedure: BRONCHOSCOPY, RIGID OR FLEXIBLE, INCLUDE FLUOROSCOPIC GUIDANCE WHEN PERFORMED; W/BRONCHIAL ALVEOLAR LAVAGE;  Surgeon: Selinda Janina Sermon, MD;  Location: MAIN OR [place];  Service: Pulmonary    PR BRONCHOSCOPY,TRANSBRON ASPIR BX Right 01/09/2020    Procedure: BRONCHOSCOPY, RIGID/FLEX, INCL FLUORO; W/TRANSBRONCH NDL ASPIRAT BX, TRACHEA, MAIN STEM &/OR LOBAR BRONCHUS;  Surgeon: Selinda Janina Sermon, MD;  Location: MAIN OR [place];  Service: Pulmonary    PR BRONCHOSCOPY,TRANSBRONCH BIOPSY Right 01/09/2020    Procedure: BRONCHOSCOPY, RIGID/FLEXIBLE, INCLUDE FLUORO GUIDANCE WHEN PERFORMED; W/TRANSBRONCHIAL LUNG BX, SINGLE LOBE;  Surgeon: Selinda Janina Sermon, MD;  Location: MAIN OR [place];  Service: Pulmonary    PR CATH PLACE/CORON ANGIO, IMG SUPER/INTERP,W LEFT HEART VENTRICULOGRAPHY N/A 08/26/2019    Procedure: Left Heart Catheterization;  Surgeon: Fairy Glean Ship, MD;  Location: [facility] C[facility];  Service: Cardiology    PR LIGATN THOR DUCT,CERV APPROACH Bilateral 07/28/2020    Procedure: SUTURE &/OR LIG THORACIC DUCT; CERV APPROACH;  Surgeon: Frederic Marcey Lou, MD;  Location: [facility]IN OR [facility];  Service: ENT    PR REMOVAL NODES, NECK,CERV MOD RAD Bilateral 07/28/2020    Procedure: CERVICAL LYMPHADENECTOMY (MODIFIED RADICAL NECK DISSECTION);  Surgeon: Frederic Marcey Lou, MD;  Location: MAIN OR [facility];  Service: ENT    PR THYROIDECTOMY,MALIG,LTD NECK SURG Bilateral 04/12/2020    Procedure: THYROIDECTOMY, TOTAL OR SUBTOTAL FOR MALIGNANCY; WITH LIMITED NECK DISSECTION;  Surgeon: Frederic Marcey Lou, MD;  Location: MAIN OR [facility];  Service: ENT       Current Outpatient Medications   Medication Sig Dispense Refill    alcohol  swabs  (ALCOHOL  PREP PADS) PadM Use three times a day as needed. (Patient not taking: Reported on 12/12/2023) 100 each 0    amlodipine  (NORVASC ) 10 MG tablet Take 1 tablet (10 mg total) by mouth daily. 30 tablet 11    atorvastatin  (LIPITOR) 80 MG tablet Take 1 tablet (80 mg total) by mouth nightly. 30 tablet 11    BD LUER-LOK SYRINGE 3 mL 23 gauge x 1 1/2 Syrg  (Patient not taking: Reported on 12/12/2023)      bimekizumab -bkzx 160 mg/mL AtIn Inject the contents of 2 pens (320mg ) under the skin every 2 weeks for the first 16 weeks followed by every 4 weeks. (Patient not taking: Reported on 12/12/2023) 4 mL 4    bimekizumab -bkzx 160 mg/mL AtIn Inject the contents of 2 pens (320mg ) subcutaneous every 4 weeks (Patient not taking: Reported on 12/12/2023) 2 mL 11    blood-glucose meter kit Disp. blood glucose meter kit preferred by patient's insurance. Check blood sugars as directed by provider. Dx: Diabetes, E11.9 (Patient not taking: Reported on 12/12/2023) 1 each 11    carvedilol  (COREG ) 25 MG tablet Take 1 tablet (25 mg total) by mouth two (2) times a day. 180 tablet 3    clindamycin  (CLEOCIN  T) 1 % lotion Apply topically two (2) times a day. 60 mL 11    clindamycin  (CLEOCIN ) 300 MG capsule Take 1 capsule (300 mg total) by mouth two (2) times a day. 60 capsule 0    DEXCOM G7 RECEIVER Misc Use as directed. 1 each 0    DEXCOM G7 SENSOR Devi Apply Dexcom G7 sensor every 10 days. 9 each 3    empty container Misc Use as directed to dispose of Humira  pens. (Patient not taking: Reported on 12/12/2023) 1 each 2    FLUoxetine  (PROZAC ) 10 MG capsule Take 1 capsule (10 mg total) by mouth daily. 30 capsule 2    gabapentin  (NEURONTIN ) 300 MG capsule Take 1 capsule (300 mg total) by mouth two (2) times a day. 180 capsule 2    hydrOXYzine  (ATARAX ) 25 MG tablet Take 1 tablet (25 mg total) by mouth every eight (8) hours as needed for itching. (Patient not taking: Reported on 12/12/2023) 30 tablet 0    ibuprofen  (MOTRIN ) 600 MG tablet Take 1 tablet (600 mg total) by mouth two (2) times a day. (Patient not taking: Reported on 12/12/2023) 60 tablet 1    insulin  glargine (BASAGLAR , LANTUS ) 100 unit/mL (3 mL) injection pen Inject 0.24 mL (24 Units total) under the skin nightly. 15 mL 2    larotrectinib  (VITRAKVI ) 100 mg capsule Take 1 capsule (100 mg total) by mouth two (2) times a day . (Patient not taking:  Reported on 12/12/2023) 60 capsule 5    levothyroxine  (SYNTHROID ) 300 MCG tablet Take 1 tablet (300 mcg total) by mouth daily. 30 tablet 11    loratadine (CLARITIN) 10 mg tablet Take 1 tablet (10 mg total) by mouth two (2) times a day. 60 tablet 11    metFORMIN  (GLUCOPHAGE -XR) 500 MG 24 hr tablet Take 2 tablets (1,000 mg total) by mouth daily before breakfast. 60 tablet 2    nicotine  (NICODERM CQ ) 21 mg/24 hr patch Place 1 patch on the skin daily. Remove old patch before applying new one. 28 patch 2    nicotine  polacrilex (NICORETTE) 4 MG gum Chew 1 piece (4 mg total) and park in cheek every hour as needed for smoking cessation. Max is up to 24 pieces/day. 110 each 2    nystatin (MYCOSTATIN) 100,000 unit/gram cream Apply topically two (2) times a day. (Patient not taking: Reported on 12/12/2023)      pen needle, diabetic (PEN NEEDLE) 31 gauge x 5/16 (8 mm) Ndle Injection Frequency is 1 time per day 100 each 11    pioglitazone  (ACTOS ) 30 MG tablet Take 1 tablet (30 mg total) by mouth daily. 30 tablet 5    polyethylene glycol (GLYCOLAX ) 17 gram/dose powder Take 17 g by mouth daily as needed. (Patient not taking: Reported on 12/12/2023) 255 g 11    traZODone  (DESYREL ) 50 MG tablet Take 1 tablet (50 mg total) by mouth nightly. 30 tablet 2    triamcinolone  (KENALOG ) 0.1 % ointment Apply topically two (2) times a day. Apply twice a day to affected areas of the skin until the skin feels smooth, then stop. 80 g 0     No current facility-administered medications for this visit.       Allergies   Allergen Reactions    Penicillin Anaphylaxis    Fish Derived     Lisinopril     Peanut Butter Flavor Per allergy testing    Penicillins Other (See Comments)     Per patient, when he took at age 45 his legs and arms shook. Not sure if it was seizure or not.     Shellfish Containing Products     Soy        Social History     Tobacco Use    Smoking status: Every Day     Types: Cigarettes     Passive exposure: Current    Smokeless tobacco: Never   Vaping Use    Vaping status: Never Used   Substance Use Topics    Alcohol  use: Yes    Drug use: Not Currently     Types: Marijuana     Comment: quit in 1992       Social History     Social History Narrative    Not on file       Family History   Problem Relation Age of Onset    Melanoma Neg Hx     Basal cell carcinoma Neg Hx     Squamous cell carcinoma Neg Hx        Review of Systems: A 12-system review of systems was obtained including: Constitutional, Eyes, ENT, Cardiovascular, Respiratory, GI, GU, Musculoskeletal, Skin, Neurological, Psychiatric, Endocrine, Heme/Lymphatic, and Allergic/Immunologic systems. It is negative or non-contributory to the patient???s management except for as stated in patient's HPI    ECOG PS: 1    Physical Examination:  Vital Signs: BP (S) 188/114 Comment: Has not been taking BP meds d/t beeing misplaced. Denies symptoms, notified  care team. - Pulse 91  - Temp 37.2 ??C (99 ??F) (Temporal)  - Resp 18  - Ht 170.2 cm (5' 7)  - Wt 79.5 kg (175 lb 3.2 oz)  - SpO2 98%  - BMI 27.44 kg/m??   CONSTITUTIONAL: Pleasant man  Oral Cavity: MMM, no oral lesions or mucositis  Lymphatics: No lymphadenopathy   CV: RRR; no lower extremity edema  RESP: normal work of breathing  GI: Soft, non-tender, non-distended  SKIN: No skin rashes; groin not examined   NEURO: no focal deficits appreciated,  PSYCH: Normal mood and appropriate affect    LABS:   Lab Results   Component Value Date    WBC 7.3 12/12/2023    HGB 11.6 (L) 12/12/2023    HCT 36.3 (L) 12/12/2023    PLT 264 12/12/2023       Lab Results   Component Value Date    NA 134 (L) 12/12/2023    K 3.9 12/12/2023    CL 97 (L) 12/12/2023    CO2 26.0 12/12/2023    BUN 16 12/12/2023    CREATININE 1.00 12/12/2023    GLU 414 (HH) 12/12/2023    CALCIUM 9.5 12/12/2023    MG 1.6 07/31/2020    PHOS 3.0 07/31/2020       Lab Results   Component Value Date    BILITOT 0.3 12/12/2023    PROT 7.9 12/12/2023    ALBUMIN 3.5 12/12/2023    ALT 38 12/12/2023    AST 34 12/12/2023    ALKPHOS 128 (H) 12/12/2023       No results found for: PT, INR, APTT      IMAGING  CT Neck (11/15/2023)  Increased size of 1.2 cm submental lymph node concerning for metastatic disease.      Irregular enhancement of the base of tongue and effacement of the right vallecula, direct visualization recommended.      Slightly increased size of 2.6 cm fat-containing left scalp lesion since 2024 where it measured 2.1 cm.   Right vocal cord paralysis.       PATHOLOGY  THYROID GLAND  8th Edition - Protocol posted: 03/27/2018   THYROID GLAND: RESECTION - All Specimens  Clinical History  No known radiation exposure    SPECIMEN   Procedure  Total thyroidectomy    TUMOR   Tumor Focality  Multifocal    Tumor Characteristics     Tumor Site  Right lobe      Left lobe      Isthmus    Histologic Type  Papillary carcinoma, classic (usual, conventional)    Tumor Size  Greatest Dimension (Centimeters): 3.1 cm   Extrathyroidal Extension  Not identified    Angioinvasion (vascular invasion)  Not identified    Lymphatic Invasion  Not identified    Perineural Invasion  Not identified    Margins  Uninvolved by carcinoma    Distance of Invasive Carcinoma from Closest Margin (Millimeters)  1 mm   Mitotic Rate  4 Mitoses per 2 mm^2   LYMPH NODES   Number of Lymph Nodes Involved  14    Nodal Levels Involved  Level VI    Size of Largest Metastatic Deposit (Centimeters)  5 cm   Extranodal Extension (ENE)  Present    Number of Lymph Nodes Examined  14    Nodal Levels Examined  Level VI    PATHOLOGIC STAGE CLASSIFICATION (pTNM, AJCC 8th Edition)   TNM Descriptors  m (multiple primary tumors) Primary Tumor (pT)  pT2    Regional Lymph Nodes (pN)  pN1a    Distant Metastasis (pM)  pM1    Site(s)  lung see [id number]      [facility] xT       I have personally reviewed relevant imaging, laboratory values, existing medical records, and pathology. I have summarized these findings in the oncology history above.

## 2023-12-14 NOTE — Progress Notes (Signed)
I was the supervising physician in the delivery of the service. Miguel Rota, MD

## 2023-12-17 MED FILL — VITRAKVI 100 MG CAPSULE: ORAL | 30 days supply | Qty: 60 | Fill #3

## 2023-12-20 MED ORDER — TRIAMCINOLONE ACETONIDE 0.1 % TOPICAL OINTMENT
Freq: Two times a day (BID) | TOPICAL | 0 refills | 0.00000 days
Start: 2023-12-20 — End: 2024-12-19

## 2023-12-20 NOTE — Assessment & Plan Note (Addendum)
 Orders:    triamcinolone  (KENALOG ) 0.1 % ointment; Apply topically two (2) times a day. Apply twice a day to affected areas of the skin until the skin feels smooth, then stop.

## 2023-12-20 NOTE — Assessment & Plan Note (Addendum)
 Needs help getting his Dexcom sensor on. Confirms that he is taking 24 U of lantus  at night. Dexcom placed in clinic and sensor connected to reader successfully.

## 2023-12-20 NOTE — Assessment & Plan Note (Addendum)
 Blood pressure much improved otday at 139/89 in the setting of having his medications.  Took all his medications and has his pill packs! Has only been on these for about a week. Has follow up with CPP on 12/11 and if still elevated consider adding hydrochlorothiazide .

## 2023-12-20 NOTE — Assessment & Plan Note (Addendum)
 Confrimed that he received his cancer medications as well. Has an appoinmtne with Surg onc on 12/17 and follow up with endo on 12/29.

## 2023-12-20 NOTE — Progress Notes (Signed)
 [facility]    Patient ID: Paul Hines is a 53 y.o. male who presents for follow up of BP and DMIII  PCP: Khloey Chern, Schuyler Antigua, MD      Informant: Patient came to appointment alone.    Assessment/Plan:      Assessment & Plan  Type 2 diabetes mellitus without complication, with long-term current use of insulin  (CMS-HCC)  Needs help getting his Dexcom sensor on. Confirms that he is taking 24 U of lantus  at night. Dexcom placed in clinic and sensor connected to reader successfully.            Essential hypertension  Blood pressure much improved otday at 139/89 in the setting of having his medications.  Took all his medications and has his pill packs! Has only been on these for about a week. Has follow up with CPP on 12/11 and if still elevated consider adding hydrochlorothiazide .        Thyroid cancer    (CMS-HCC)      Confrimed that he received his cancer medications as well. Has an appoinmtne with Surg onc on 12/17 and follow up with endo on 12/29.          Hidradenitis suppurativa    Orders:    triamcinolone  (KENALOG ) 0.1 % ointment; Apply topically two (2) times a day. Apply twice a day to affected areas of the skin until the skin feels smooth, then stop.    Homeless  Have reached out ot his Care manager to see if I can be helpful in filling out his FL2 paperwork but have not been able to reach her. Her number is [phone]. Have left voicemail to return call to clinic and FL2 paperwork will be left in my clinic mailbox in case she calls back while I am on maternity leave.                 Preventive services addressed today  We did not review preventive services today    -- Patient verbalized an understanding of today's assessment and recommendations, as well as the purpose of ongoing medications.    I personally spent 30 minutes face-to-face and non-face-to-face in the care of this patient, which includes all pre, intra, and post visit time on the date of service.    No follow-ups on file.    Subjective:     Chief Complaint   Patient presents with    Follow-up     Follow up on meds and bps        HPI  Was able to get his medications! Feeling better!    Currently at [employer].     He takes gabapentin  BID and thinks that this may be making him a little sleepy in the morning.                     ROS  As per HPI.    RELEVANT PAST MEDICAL, FAMILY, SURGICAL, SOCIAL HISTORY  Summarized above  ___________________________________  CURRENT MEDS  Current Medications[1]    ___________________________________  ALLERGIES  Allergies[2]      Objective:       Vital Signs  BP 143/89 Comment: avg - Pulse 83  - Temp 36.3 ??C (97.3 ??F) (Temporal)  - Ht 170.2 cm (5' 7.01)  - Wt 83.9 kg (185 lb)  - BMI 28.97 kg/m??      Exam  Constitutional:       Appearance: Healthy appearance. Not in distress.  Eyes:      Conjunctiva/sclera: Conjunctivae normal.   Cardiovascular:      Normal rate. Regular rhythm. Normal S1. Normal S2.       Murmurs: There is no murmur.                 [1]   Current Outpatient Medications   Medication Sig Dispense Refill    alcohol  swabs  (ALCOHOL  PREP PADS) PadM Use three times a day as needed. (Patient not taking: Reported on 12/12/2023) 100 each 0    amlodipine  (NORVASC ) 10 MG tablet Take 1 tablet (10 mg total) by mouth daily. 30 tablet 11    atorvastatin  (LIPITOR) 80 MG tablet Take 1 tablet (80 mg total) by mouth nightly. 30 tablet 11    BD LUER-LOK SYRINGE 3 mL 23 gauge x 1 1/2 Syrg  (Patient not taking: Reported on 12/12/2023)      bimekizumab -bkzx 160 mg/mL AtIn Inject the contents of 2 pens (320mg ) under the skin every 2 weeks for the first 16 weeks followed by every 4 weeks. (Patient not taking: Reported on 12/12/2023) 4 mL 4    bimekizumab -bkzx 160 mg/mL AtIn Inject the contents of 2 pens (320mg ) subcutaneous every 4 weeks (Patient not taking: Reported on 12/12/2023) 2 mL 11    blood-glucose meter kit Disp. blood glucose meter kit preferred by patient's insurance. Check blood sugars as directed by provider. Dx: Diabetes, E11.9 (Patient not taking: Reported on 12/12/2023) 1 each 11    carvedilol  (COREG ) 25 MG tablet Take 1 tablet (25 mg total) by mouth two (2) times a day. 180 tablet 3    clindamycin  (CLEOCIN  T) 1 % lotion Apply topically two (2) times a day. 60 mL 11    clindamycin  (CLEOCIN ) 300 MG capsule Take 1 capsule (300 mg total) by mouth two (2) times a day. 60 capsule 0    DEXCOM G7 RECEIVER Misc Use as directed. 1 each 0    DEXCOM G7 SENSOR Devi Apply Dexcom G7 sensor every 10 days. 9 each 3    empty container Misc Use as directed to dispose of Humira  pens. (Patient not taking: Reported on 12/12/2023) 1 each 2    FLUoxetine  (PROZAC ) 10 MG capsule Take 1 capsule (10 mg total) by mouth daily. 30 capsule 2    gabapentin  (NEURONTIN ) 300 MG capsule Take 1 capsule (300 mg total) by mouth two (2) times a day. 180 capsule 2    hydrOXYzine  (ATARAX ) 25 MG tablet Take 1 tablet (25 mg total) by mouth every eight (8) hours as needed for itching. (Patient not taking: Reported on 12/12/2023) 30 tablet 0    ibuprofen  (MOTRIN ) 600 MG tablet Take 1 tablet (600 mg total) by mouth two (2) times a day. (Patient not taking: Reported on 12/12/2023) 60 tablet 1    insulin  glargine (BASAGLAR , LANTUS ) 100 unit/mL (3 mL) injection pen Inject 0.24 mL (24 Units total) under the skin nightly. 15 mL 2    larotrectinib  (VITRAKVI ) 100 mg capsule Take 1 capsule (100 mg total) by mouth two (2) times a day . (Patient not taking: Reported on 12/12/2023) 60 capsule 5    levothyroxine  (SYNTHROID ) 300 MCG tablet Take 1 tablet (300 mcg total) by mouth daily. 30 tablet 11    loratadine  (CLARITIN ) 10 mg tablet Take 1 tablet (10 mg total) by mouth two (2) times a day. 60 tablet 11    metFORMIN  (GLUCOPHAGE -XR) 500 MG 24 hr tablet Take 2 tablets (1,000 mg total) by mouth daily before  breakfast. 60 tablet 2    nicotine  (NICODERM CQ ) 21 mg/24 hr patch Place 1 patch on the skin daily. Remove old patch before applying new one. 28 patch 2    nicotine  polacrilex (NICORETTE) 4 MG gum Chew 1 piece (4 mg total) and park in cheek every hour as needed for smoking cessation. Max is up to 24 pieces/day. 110 each 2    nystatin (MYCOSTATIN) 100,000 unit/gram cream Apply topically two (2) times a day. (Patient not taking: Reported on 12/12/2023)      pen needle, diabetic (PEN NEEDLE) 31 gauge x 5/16 (8 mm) Ndle Injection Frequency is 1 time per day 100 each 11    pioglitazone  (ACTOS ) 30 MG tablet Take 1 tablet (30 mg total) by mouth daily. 30 tablet 5    polyethylene glycol (GLYCOLAX ) 17 gram/dose powder Take 17 g by mouth daily as needed. (Patient not taking: Reported on 12/12/2023) 255 g 11    traZODone  (DESYREL ) 50 MG tablet Take 1 tablet (50 mg total) by mouth nightly. 30 tablet 2    triamcinolone  (KENALOG ) 0.1 % ointment Apply topically two (2) times a day. Apply twice a day to affected areas of the skin until the skin feels smooth, then stop. 80 g 0     No current facility-administered medications for this visit.   [2]   Allergies  Allergen Reactions    Penicillin Anaphylaxis    Fish Derived     Lisinopril     Peanut Butter Flavor      Per allergy testing    Penicillins Other (See Comments)     Per patient, when he took at age 89 his legs and arms shook. Not sure if it was seizure or not.     Shellfish Containing Products     Soy

## 2023-12-21 MED ORDER — TRIAMCINOLONE ACETONIDE 0.1 % TOPICAL OINTMENT
Freq: Two times a day (BID) | TOPICAL | 0 refills | 0.00000 days | Status: CP
Start: 2023-12-21 — End: 2024-12-20

## 2023-12-21 NOTE — Patient Instructions (Addendum)
 Your glucose sensor was placed on 11/21 and lasts for 10 days.     You have an appointment with Therisa the pharmacist on 12/11. Please be sure to go to that visit. Bring your glucose sensor and medications to your visit with her    Continue to take your pill packs.

## 2024-01-08 NOTE — Progress Notes (Unsigned)
 Subjective     Reason for visit:    Paul Hines is a 53 y.o. 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  Patient presents to this visit alone.    Known DM Complications: peripheral neuropathy    Date of Last Diabetes Related Visit: 12/13/23 with CPP, 12/21/23 with PCP    Action At Last Diabetes Related Visit:    Pt still not taking any of his meds since leaving previous living situation and moving to [employer]  Resume pioglitazone  30 mg daily  Resume Lantus  24 units daily  Resume metformin  XR 1000 mg daily  Sent Rx's for Dexcom G7 receiver + sensors to investigate if pharmacy can fill (pt had G7 CGM in the past prior to leaving meds and all supplies behind)  Resume carvedilol  25 mg BID  Resume amlodipine  10 mg daily  Resume atorvastatin  80 mg daily   Called [facility] during visit and requested lost medication override on all Rx's and confirmed they are able to refill all meds in pill packs to be delivered to [facility] on 11/14  Coordinated w/ [facility] pharmacy for Vitrakvi  chemo medication to be sent to pt in 1-2 days after CPP visit as well    Since Last visit / History of Present Illness:    History of Present Illness  [name] has been without his medications for approximately two months after leaving his previous living situation and moving to the [employer]. He feels tired all the time and sleeps excessively. He also lacks access to his insulin  and other medications, including his chemotherapy medication.    He recalls receiving a supply of insulin  pens, approximately five pens, which he believes was intended to last more than a month but left insulin  supply with all of his other medications at the place he was staying at previously. He was previously administering around 20 units of insulin  daily but currently has no access to insulin  or a glucose monitoring device. His blood sugar was noted to be high, with a recent lab at his oncology visit showing a glucose level of 414 mg/dL.    He has not received his chemotherapy medication, which he indicates is overdue, because his oncologist is still working to determine how to get the medication delivered to him. He is concerned about the potential progression of his cancer without this medication.    He reports a sore on the back of his foot, which he believes may have resulted from dryness and cracking. He has been applying lotion but is concerned about infection.    No chest pain, left-sided weakness, or blurry vision, but he feels consistently tired. He also mentions difficulty urinating, requiring effort to void.    He is currently staying at the [facility] and has been working there. He mentions not having a stable place for the past few years.    Questions/notes for today:  - Review pill packs?   - [place] separate from [facility]?  - Missed doses of meds?    - Review Dexcom G7 report on receiver?  - Room to titrate pioglitazone  to 45 mg?    - Clinic BP?  - Add back HCTZ if BP still elevated?   - Ensure adequate hydration (hypoNa in the past)?    - Repeat  lipids end of Dec/early Jan since resuming atorva 80?    - Smoking cessation?  - Using nicotine  patch + gum?    Prescribed HTN Regimen:  Amlodipine  10 mg daily  Carvedilol  25 mg BID    Prescribed DM Regimen:    Metformin  XR 1000 mg daily  Lantus  24 units daily  Pioglitazone  30 mg daily    DM medications tried in the past:   Glipizide  Insulin  NPH    Medication Adherence and Access: ***  Since last visit, patient reports not taking any medications for the past ~2 months as noted above.    Previously received all PO medications (except chemo) in pill packs from [facility] until multiple hospitalizations and needing to get meds filled from other pharmacies at discharge.    SMBG per CGM: ***  Does not have CGM or glucometer to check glucose recently. Previously had Dexcom G7 CGM Receiver + Sensors but left w/ all his other meds at last place he was staying so no longer has access to it.    Hypoglycemia:    Symptoms of hypoglycemia since last visit:  no  If yes, it was treated by: n/a    DM Prevention:  Statin: Taking; high intensity (atorvastatin  80 mg)  Aspirin: unclear if indicated; Not taking    ACEI/ARB: Not taking (losartan  held since previous hospital discharge due to concern for contributing to angioedema); Urine MA/CR Ratio - elevated urinary albumin excretion of 275.7 ug/g (last checked 12/13/23).  Last eye exam: unknown - DUE  Last foot exam: 10/16/23  Tobacco Use: Current smoker  Immunizations:   Immunization History   Administered Date(s) Administered    COVID-19 VAC,BIVALENT,MODERNA(BLUE CAP) 11/09/2020    COVID-19 VACCINE,MRNA(MODERNA)(PF) 03/06/2019, 04/03/2019, 12/18/2019, 06/08/2020    Covid-19 Vac, (87yr+) (Comirnaty) Mrna Pfizer  10/19/2022    DTaP, Unspecified Formulation 12/13/1971, 02/09/1972, 04/26/1972, 10/03/1973    INFLUENZA VACCINE IIV3(IM)(PF)6 MOS UP 10/19/2022, 12/21/2023    Influenza Vaccine Quad(IM)6 MO-Adult(PF) 12/02/2020    Influenza Virus Vaccine, unspecified formulation 11/03/2020    Measles 12/13/1971    Mumps 03/28/1974    Pneumococcal Conjugate 20-valent 11/03/2022    Polio Virus Vaccine, Unspecified Formulation 12/13/1971, 02/09/1972, 04/26/1972, 10/03/1973    Rubella 12/13/1971    SHINGRIX-ZOSTER VACCINE (HZV),RECOMBINANT,ADJUVANTED(IM) 11/03/2022, 03/14/2023    TdaP 11/03/2022     _________________________________________________    Past Medical History: reviewed PMH in epic today    Social History:  Social History     Tobacco Use   Smoking Status Every Day    Types: Cigarettes    Passive exposure: Current   Smokeless Tobacco Never       Medications: Medications reviewed in EPIC medication station and updated today by the clinical insurance underwriter.         Objective   Vitals:    There were no vitals filed for this visit. ***     Wt Readings from Last 3 Encounters:   12/21/23 83.9 kg (185 lb)   12/12/23 79.5 kg (175 lb 3.2 oz)   11/16/23 81.3 kg (179 lb 3.2 oz)       There is no height or weight on file to calculate BMI.    The 10-year ASCVD risk score (Arnett DK, et al., 2019) is: 34.4%    Values used to calculate the score:      Age: 41 years      Clinically relevant sex: Male      Is Non-Hispanic African American: Yes  Diabetic: Yes      Tobacco smoker: Yes      Systolic Blood Pressure: 143 mmHg      Is BP treated: Yes      HDL Cholesterol: 105 mg/dL      Total Cholesterol: 288 mg/dL    Note: For patients with SBP <90 or >200, Total Cholesterol <130 or >320, HDL <20 or >100 which are outside of the allowable range, the calculator will use these upper or lower values to calculate the patient???s risk score.      Labs:   Lab Results   Component Value Date    A1C 13.5 (H) 12/12/2023    A1C 9.1 (H) 03/14/2023    A1C >14.0 (H) 10/19/2022       Assessment/Plan:    Assessment & Plan      1. Diabetes, type 2: uncontrolled per last A1c 13.5% (12/12/23), largely due to being off all medications for the prior couple of months due to unstable housing. Goal <7% per ADA guidelines. *** Able to work with [employer] to get new month supply of all PO meds in pill packs + insulin  refill set to deliver tomorrow (11/14) so pt can resume prescribed DM med regimen. Unfortunately, pt's smart phone is not compatible w/ either Libre or Dexcom apps so unlikely to be able to use CGM unless able to get him a physical reader/receiver affordably.  Resume pioglitazone  30 mg daily  Resume Lantus  insulin  24 units once daily   Resume metformin  XR 1000 mg daily  Repeat A1c due 03/14/24  Reviewed symptoms and treatment of hypoglycemia, including need to check BG prior to and following treatment.  SMBG instructions: once daily fasting (if able to get access to BG meter + testing supplies again in the future)  Resume use of CGM if able to get reader/receiver at an affordable cost (anticipate Medicaid will not cover another meter/reader since pt has had one filled in the past 5 years) - sent Rx's for Dexcom G7 Receiver + Sensors to see if pharmacy can fill  Future considerations:  Titrate metformin  to target dose of 2000 mg daily as tolerated - pt has preferred to stay at 1000 mg daily at recent visits  Titrate pioglitazone  to max of 45 mg daily as tolerated  Caution w/ SGLT2i given recent SSTI in inguinal region and uncontrolled HS affecting inguinal region (also potentially higher risk of GU infections with significantly elevated A1c)  Could still consider GLP-1 RA given non-medullary thyroid carcinomas not a contraindication and have unclear association - also potential to improve CKD/CV-related outcomes given microalbuminuria present (if SGLT2i not pursued)    2. Hypertension: uncontrolled based on clinic BP today of ***, although again due to pt not taking any meds for several weeks/months and reassuring that pt denies s/sx hypertensive emergency. Goal <130/80 mmHg per ADA guidelines. Recommended pt resume all HTN meds as prescribed w/ new pill packs coming this week.  Resume amlodipine  10 mg daily  Resume carvedilol  25 mg BID  Future considerations:  Caution w/ resuming ARB due to concern this may have contributed to recent angioedema episode  Resume thiazide diuretic as indicated and as long as Scr/electrolytes stable (hyponatremia during last hospitalization) and able to adequately hydrate    3. Non-obstructive CAD - ASCVD risk reduction: last LDL elevated at 154 (12/12/23). Goal LDL <70 given DM + ASCVD risk >20% (as well as notable history of non-obstructive CAD). Appropriately on high intensity statin, though continued adherence concerns. May need  additional non-statin LDL lowering therapies in the future but will defer for now with focus on consistent adherence to current statin regimen. Also previously discussed smoking cessation and encouraged pt to use nicotine  patch + gum more consistently and getting pt refills on these as well this week.  Resume atorvastatin  80 mg daily  Resume nicotine  21 mg/24h patch + nicotine  4 mg gum PRN cravings  Future considerations:  Add PO ezetimibe if LDL remains above goal despite confirmed adherence w/ max dose statin  Trial bupropion or Chantix for smoking cessation if pt prefers alternatives to NRT - bupropion may provide comorbid benefit w/ depression    4. Medication management and care coordination:  [facility] pharmacy during today's visit and confirmed they are able to submit lost medication override (pt had several Rx's filled at [facility] pharmacy less than 30 days ago on 11/26/23 but has since lost all meds) and insurance seems to be approving all Rx refills today. [place] pharmacy will work on filling all PO meds (except Vitrakvi ) in pill packs to be delivered to [facility] tomorrow (11/14).  Clarified w/ [facility] pharmacy that main issue w/ filling Vitrakvi  is that they need a phone number to call before sending any Rx fills. Confirmed pt has current phone number (as listed in Epic) and added [facility] pharmacy phone number to pt's contacts and emphasized that he needs to answer the call from the pharmacy for them to set up delivery of his chemo medication, to which he agrees to do. Anticipate [facility] pharmacy will call pt later this afternoon to set up next delivery of his chemo.  Recommended pt make appointment in urgent care to evaluate sore on back of his foot/heel if concerned for infection.  Will also message PCP to see if she is okay to prescribe refill of triamcinolone  ointment for itching per pt request today.      Follow-up: ~1 week with PCP and ~1 month with CPP    Future Appointments   Date Time Provider Department Center   01/10/2024  8:30 AM Maryhelen Therisa Reusing, CPP [facility]   01/16/2024  3:15 PM ADULT ONC LAB [facility]   01/16/2024  4:20 PM Sheth, Siddharth Hemant, DO [facility]   01/28/2024  3:10 PM Betha Romero [place], MD [facility]       I spent a total of 40 minutes face to face with the patient delivering clinical care and providing education/counseling.  _________________________________________________  Therisa Maryhelen, PharmD, CPP, [facility]  Family Medicine Clinical Pharmacist

## 2024-01-08 NOTE — Patient Instructions (Incomplete)
 Schedule eye exam at [facility] eye center:  [facility] O[address]THALMOLOGY [address] [place] [facility] [place]  Phone number: [phone]    Call [facility] dermatology to schedule a follow-up visit: [phone]     Take everything in your pill packs PLUS:  Lantus  insulin  24 units daily  Vitrakvi  (larotrectinib ) 1 capsule (100 mg) twice daily  Nicotine  patches and gum    How often and when to check blood sugar:  Use Dexcom G7 continuous glucose monitor (CGM) as instructed. Change sensors every 10 days.    How to treat low blood sugar:  For blood sugar less than 70 --> Treat with 4 ounces of juice or regular soda, or with 3 to 4 glucose tablets. Re-check blood sugar in 15 minutes.    If blood sugar is still less than 70 on re-check, treat again and re-check in 15 minutes.   If blood sugar is over 70 on re-check and it is time to eat your regular meal, eat regular meal and take insulin  as prescribed for that blood sugar.    When to follow-up:  *** with Therisa (pharmacist)  Bring all your medications!      Therisa Crisp, PharmD, CPP, [facility]  Family Medicine Clinical Pharmacist  Clinic Phone: [phone]

## 2024-01-09 NOTE — Progress Notes (Signed)
 FL2 form faxed to [phone]

## 2024-01-14 DIAGNOSIS — C73 Malignant neoplasm of thyroid gland: Principal | ICD-10-CM

## 2024-01-15 NOTE — Telephone Encounter (Signed)
 Per pharmacy, they are attempting to find out if pt will need another refill on Clindamycin  before they ship medication.

## 2024-01-15 NOTE — Telephone Encounter (Signed)
 Copied from CRM #[id number]. Topic: Access To Clinicians - Medication Refill  >> Jan 15, 2024  1:15 PM Terrilyn Lips wrote:      Pharmacy is requesting the following:     Medication Name(s), Dose/Strengths, and Instructions: clindamycin  (CLEOCIN ) 300 MG capsule [[id number]]     Quantity for 1 month supply    Pharmacy name and address: [facility] - [place], [place] - [address]  [address] [place] [address]  Phone: [phone] Fax: [phone]  Hours: Not open 24 hours            Coverage: yes, coverage is accurate on file.    Urgent turnaround time: within 24 business hours. (Caller Notified)    Urgent Reason: Completely out of medication(s)      Does the caller want to be contacted regarding this request? Yes. Please contact The patient by Cell Phone Telephone Information:  Mobile          [phone]

## 2024-01-16 ENCOUNTER — Ambulatory Visit: Admit: 2024-01-16 | Payer: Medicaid (Managed Care)

## 2024-01-16 ENCOUNTER — Ambulatory Visit
Admit: 2024-01-16 | Payer: Medicaid (Managed Care) | Attending: Student in an Organized Health Care Education/Training Program | Primary: Student in an Organized Health Care Education/Training Program

## 2024-01-16 NOTE — Telephone Encounter (Signed)
 Spoke with pharmacy and informed them, medication was written by provider at a different location. Provider name and contact information given to pharmacist.

## 2024-01-16 NOTE — Progress Notes (Signed)
 The [facility] has made a second and final attempt to reach this patient to refill the following medication:larotrectinib : VITRAKVI  100 mg capsule.      We have been unable to leave messages on the following phone numbers: [phone] and [phone] and have sent a text message to the following phone numbers: [phone].    Dates contacted: 01/10/2024 and 01/16/2024  Last scheduled delivery: 12/17/2023    The patient may be at risk of non-compliance with this medication. The patient should call the [facility] at [phone]  Option 4, then Option 1: Oncology to refill medication.    Lucie HERO Britley Gashi   [facility] Oncologist

## 2024-01-18 NOTE — Progress Notes (Signed)
 Called mobile and home numbers listed for Ranen - neither were successful. Called work number listed and left message with [facility] for [employer] asking for call back to reschedule missed appointments with our team for lab and clinic visit.

## 2024-01-28 NOTE — Progress Notes (Unsigned)
 ASSESSMENT and PLAN:     There are no diagnoses linked to this encounter.      No follow-ups on file.          SUBJECTIVE:       Rylan C Chargois is a 53 y.o. male who presents for initial evaluation of type 2 diabetes.    Housing and medication instability adversely impacting care      Other pertinent PMH includes:  Metastatic PTC: Dr. Delilah,     Most recent A1c   Lab Results   Component Value Date    HGB A1C, POC 9.1 (H) 03/14/2023    Hemoglobin A1C 13.5 (H) 12/12/2023   .      Past Medical History[1]    Current Medications[2]    Allergies[3]    Social History  Social History [4]       Review of Systems  A 10 systems reviewed and were negative except for pertinent items noted in the HPI and below:    OBJECTVE:   There were no vitals filed for this visit.   Gen:  In no apparent distress, appears {laystatedage:26702::consistent with stated age}   Eyes:  Anicteric, EOMI  EENT: OP clear, {Desc; good/fair/poor:18582} dentition  Neck: supple with no palpable thyroid abnormalities  Lymph: no palpable cervical or supraclavicular lymphadenopathy  CVS: RRR, no r/m/g; DP/PT pulses {Desc; pulse strength:32214} equal and symmetric  Pulm: CTAB, equal and symmetric respiratory effort  Abd: soft, non-tender, non-distended   Skin: barefoot examination reveals {visual foot detail:[phone]}   MUSK: normal station and gait  Psych:  Alert and oriented x 4; mood is {laymood:26704::not down, depressed or anxious}  Neuro: Sensation {is/is not:23060} intact to 10 gm monofilament and vibration bilaterally    Lab Review    DIABETES MELLITUS RESULTS:    HGB A1C, POC (%)   Date Value   03/14/2023 9.1 (H)   10/19/2022 >14.0 (H)     Hemoglobin A1C (%)   Date Value   12/12/2023 13.5 (H)   06/20/2022 11.4 (H)   02/01/2022 9.8 (H)     Glucose, POC (mg/dL)   Date Value   91/68/7976 298 (H)   01/14/2021 147   07/31/2020 144         Lab Results   Component Value Date    CREATININE 1.00 12/12/2023     Lab Results   Component Value Date CHOL 288 (H) 12/12/2023     Lab Results   Component Value Date    HDL 105 12/12/2023     No components found for: LDLCALC, DIRECTLDL  Lab Results   Component Value Date    TRIG 164 (H) 12/12/2023     Lab Results   Component Value Date    TSH 60.493 (H) 12/12/2023     Creatinine Whole Blood, POC (mg/dL)   Date Value   91/93/7974 1.3   12/20/2022 1.1   05/30/2022 1.3     Creatinine (mg/dL)   Date Value   88/87/7974 1.00   09/05/2023 1.02   07/26/2023 1.19 (H)              [1]   Past Medical History:  Diagnosis Date    Abnormal findings on dx imaging of heart and cor circ     Acne vulgaris     Allergic contact dermatitis due to food in contact with skin     Atherosclerosis     Athscl heart disease of native coronary artery w/o ang pctrs  Chronic ischemic heart disease     Cutaneous abscess     Diabetic polyneuropathy (CMS-HCC)     Disorder of skin and subcutaneous tissue     Essential hypertension     Financial difficulties     H/O medication noncompliance     Hernia, inguinal, unilateral     Hypertrophic cardiomyopathy    (CMS-HCC)     Malignant neoplasm of thyroid gland    (CMS-HCC)     Presbyopia     Problem with transportation     Pseudofolliculitis barbae     Shoulder pain, bilateral     Tinea unguium     Type 2 diabetes mellitus (CMS-HCC)     Visual impairment     Xerosis cutis    [2]   Current Outpatient Medications   Medication Sig Dispense Refill    alcohol  swabs  (ALCOHOL  PREP PADS) PadM Use three times a day as needed. (Patient not taking: Reported on 12/12/2023) 100 each 0    amlodipine  (NORVASC ) 10 MG tablet Take 1 tablet (10 mg total) by mouth daily. 30 tablet 11    atorvastatin  (LIPITOR) 80 MG tablet Take 1 tablet (80 mg total) by mouth nightly. 30 tablet 11    BD LUER-LOK SYRINGE 3 mL 23 gauge x 1 1/2 Syrg  (Patient not taking: Reported on 12/12/2023)      bimekizumab -bkzx 160 mg/mL AtIn Inject the contents of 2 pens (320mg ) under the skin every 2 weeks for the first 16 weeks followed by every 4 weeks. (Patient not taking: Reported on 12/12/2023) 4 mL 4    bimekizumab -bkzx 160 mg/mL AtIn Inject the contents of 2 pens (320mg ) subcutaneous every 4 weeks (Patient not taking: Reported on 12/12/2023) 2 mL 11    blood-glucose meter kit Disp. blood glucose meter kit preferred by patient's insurance. Check blood sugars as directed by provider. Dx: Diabetes, E11.9 (Patient not taking: Reported on 12/12/2023) 1 each 11    carvedilol  (COREG ) 25 MG tablet Take 1 tablet (25 mg total) by mouth two (2) times a day. 180 tablet 3    clindamycin  (CLEOCIN  T) 1 % lotion Apply topically two (2) times a day. 60 mL 11    clindamycin  (CLEOCIN ) 300 MG capsule Take 1 capsule (300 mg total) by mouth two (2) times a day. 60 capsule 0    DEXCOM G7 RECEIVER Misc Use as directed. 1 each 0    DEXCOM G7 SENSOR Devi Apply Dexcom G7 sensor every 10 days. 9 each 3    empty container Misc Use as directed to dispose of Humira  pens. (Patient not taking: Reported on 12/12/2023) 1 each 2    FLUoxetine  (PROZAC ) 10 MG capsule Take 1 capsule (10 mg total) by mouth daily. 30 capsule 2    gabapentin  (NEURONTIN ) 300 MG capsule Take 1 capsule (300 mg total) by mouth two (2) times a day. 180 capsule 2    hydrOXYzine  (ATARAX ) 25 MG tablet Take 1 tablet (25 mg total) by mouth every eight (8) hours as needed for itching. (Patient not taking: Reported on 12/12/2023) 30 tablet 0    ibuprofen  (MOTRIN ) 600 MG tablet Take 1 tablet (600 mg total) by mouth two (2) times a day. (Patient not taking: Reported on 12/12/2023) 60 tablet 1    insulin  glargine (BASAGLAR , LANTUS ) 100 unit/mL (3 mL) injection pen Inject 0.24 mL (24 Units total) under the skin nightly. 15 mL 2    larotrectinib  (VITRAKVI ) 100 mg capsule Take 1 capsule (100 mg total) by mouth  two (2) times a day . (Patient not taking: Reported on 12/12/2023) 60 capsule 5    levothyroxine  (SYNTHROID ) 300 MCG tablet Take 1 tablet (300 mcg total) by mouth daily. 30 tablet 11    loratadine  (CLARITIN ) 10 mg tablet Take 1 tablet (10 mg total) by mouth two (2) times a day. 60 tablet 11    metFORMIN  (GLUCOPHAGE -XR) 500 MG 24 hr tablet Take 2 tablets (1,000 mg total) by mouth daily before breakfast. 60 tablet 2    nicotine  (NICODERM CQ ) 21 mg/24 hr patch Place 1 patch on the skin daily. Remove old patch before applying new one. 28 patch 2    nicotine  polacrilex (NICORETTE) 4 MG gum Chew 1 piece (4 mg total) and park in cheek every hour as needed for smoking cessation. Max is up to 24 pieces/day. 110 each 2    nystatin (MYCOSTATIN) 100,000 unit/gram cream Apply topically two (2) times a day. (Patient not taking: Reported on 12/12/2023)      pen needle, diabetic (PEN NEEDLE) 31 gauge x 5/16 (8 mm) Ndle Injection Frequency is 1 time per day 100 each 11    pioglitazone  (ACTOS ) 30 MG tablet Take 1 tablet (30 mg total) by mouth daily. 30 tablet 5    polyethylene glycol (GLYCOLAX ) 17 gram/dose powder Take 17 g by mouth daily as needed. (Patient not taking: Reported on 12/12/2023) 255 g 11    traZODone  (DESYREL ) 50 MG tablet Take 1 tablet (50 mg total) by mouth nightly. 30 tablet 2    triamcinolone  (KENALOG ) 0.1 % ointment Apply topically two (2) times a day. Apply twice a day to affected areas of the skin until the skin feels smooth, then stop. 80 g 0     No current facility-administered medications for this visit.   [3]   Allergies  Allergen Reactions    Penicillin Anaphylaxis    Fish Derived     Lisinopril     Peanut Butter Flavor      Per allergy testing    Penicillins Other (See Comments)     Per patient, when he took at age 76 his legs and arms shook. Not sure if it was seizure or not.     Shellfish Containing Products     Soy    [4]   Social History  Socioeconomic History    Marital status: Single   Tobacco Use    Smoking status: Every Day     Types: Cigarettes     Passive exposure: Current    Smokeless tobacco: Never   Vaping Use    Vaping status: Never Used   Substance and Sexual Activity    Alcohol  use: Yes    Drug use: Not Currently     Types: Marijuana     Comment: quit in 1992   Other Topics Concern    Do you use sunscreen? No    Tanning bed use? No    Are you easily burned? No    Excessive sun exposure? No    Blistering sunburns? No     Social Drivers of Health     Food Insecurity: Food Insecurity Present (10/30/2023)    Received from [facility]    Hunger Vital Sign     Within the past 12 months, you worried that your food would run out before you got the money to buy more.: Sometimes true     Within the past 12 months, the food you bought just didn't last and you didn't have  money to get more.: Sometimes true   Tobacco Use: High Risk (01/20/2024)    Received from [facility]    Patient History     Smoking Tobacco Use: Every Day     Smokeless Tobacco Use: Never   Transportation Needs: No Transportation Needs (10/30/2023)    Received from [facility] - Transportation     In the past 12 months, has lack of transportation kept you from medical appointments or from getting medications?: No     Lack of Transportation (Non-Medical): No   Alcohol  Use: Not At Risk (07/02/2023)    Alcohol  Use     How often do you have a drink containing alcohol ?: Never     How many drinks containing alcohol  do you have on a typical day when you are drinking?: 1 - 2     How often do you have 5 or more drinks on one occasion?: Never   Housing: High Risk (10/30/2023)    Received from [facility]    Housing Stability Vital Sign     In the last 12 months, was there a time when you were not able to pay the mortgage or rent on time?: Yes     In the past 12 months, how many times have you moved where you were living?: 2     At any time in the past 12 months, were you homeless or living in a shelter (including now)?: Yes   Physical Activity: Inactive (07/02/2023)    Exercise Vital Sign     Days of Exercise per Week: 0 days     Minutes of Exercise per Session: 0 min   Utilities: Not At Risk (10/02/2023)    Received from [facility] Utilities     In the past 12 months has the electric, gas, oil, or water company threatened to shut off services in your home?: No   Stress: Stress Concern Present (07/02/2023)    [employer] - Occupational Stress Questionnaire     Feeling of Stress : Very much   Interpersonal Safety: At Risk (07/02/2023)    Interpersonal Safety     Unsafe Where You Currently Live: Yes     Physically Hurt by Anyone: No     Abused by Anyone: No   Substance Use: Low Risk (07/02/2023)    Substance Use     In the past year, how often have you used prescription drugs for non-medical reasons?: Never     In the past year, how often have you used illegal drugs?: Never     In the past year, have you used any substance for non-medical reasons?: No   Social Connections: Socially Isolated (07/02/2023)    Social Connection and Isolation Panel     Frequency of Communication with Friends and Family: Never     Frequency of Social Gatherings with Friends and Family: Never     Attends Religious Services: Never     Database Administrator or Organizations: No     Attends Banker Meetings: Never     Marital Status: Never married   Physicist, Medical Strain: Medium Risk (10/30/2023)    Received from [facility]    Overall Financial Resource Strain (CARDIA)     Difficulty of Paying Living Expenses: Somewhat hard   Health Literacy: Low Risk (07/02/2023)    Health Literacy     :  Never   Internet Connectivity: No Internet connectivity concern identified (07/02/2023)    Internet Connectivity     Do you have access to internet services: Yes     How do you connect to the internet: Personal Device at home     Is your internet connection strong enough for you to watch video on your device without major problems?: Yes     Do you have enough data to get through the month?: Yes     Does at least one of the devices have a camera that you can use for video chat?: Yes

## 2024-02-19 NOTE — Telephone Encounter (Signed)
 Returned Call to Mr Stcyr after receiving call from the communication center in reference to  needing medication refills. He is asking for a phone call from Joesph Fuss CPP  He said he reached out to his pharmacy and they said that he needs to call us  for refills.  He requested all of the medications.         Triage Recommendations:   I told him that he had refills for all medications except :    Cleocin   Dexcom whole set (he is requesting a new set)   Triamcilinone- from PCP   Hydroxyzine   Trazadone  Glucaphage  Insulin  Lantus  injection pen    I advised that I would let Joesph Fuss CPP know he is calling about these refills and will ask if she can call him back.  Advised that this request may take up to 24-48 hours depending on whether or not the team is in clinic.      Caller's Response:   He was  appreciative of the call and is agreeable to this plan      Outstanding tasks: Care team notified

## 2024-02-19 NOTE — Telephone Encounter (Signed)
 Copied from CRM #[id number]. Topic: Medication Refill - Medication Refill  >> Feb 19, 2024  1:17 PM Geni MATSU wrote:      Hi,    Patient Paul Hines called requesting a medication refill for the following:    ?? Medication: Per patient ALL meds    ?? Pharmacy: [facility] - [place], [place] - [address]  [address] [place] [address]  Phone: [phone]  Fax: [phone]         The expected turnaround time is 3-4 business days     Thank you,  Geni JONELLE Platt  [facility]  [phone]

## 2024-02-26 DIAGNOSIS — L732 Hidradenitis suppurativa: Principal | ICD-10-CM

## 2024-02-26 MED ORDER — TRIAMCINOLONE ACETONIDE 0.1 % TOPICAL OINTMENT
Freq: Two times a day (BID) | TOPICAL | 0 refills | 0.00000 days
Start: 2024-02-26 — End: 2025-02-25

## 2024-02-26 NOTE — Progress Notes (Signed)
 [facility] Refill Coordination Note    Specialty Medication(s) to be Shipped:   Hematology/Oncology: Vitrakvi     Other medication(s) to be shipped: Clindamycin     Specialty Medications not needed at this time: N/A     Paul Hines, DOB: 05-04-1970  Phone: [phone] (home) [phone] (work)      All above HIPAA information was verified with patient.     Was a nurse, learning disability used for this call? No    Completed refill call assessment today to schedule patient's medication shipment from the [facility]  [phone]).  All relevant notes have been reviewed.     Specialty medication(s) and dose(s) confirmed: Regimen is correct and unchanged.   Changes to medications: Kanan reports no changes at this time.  Changes to insurance: No  New side effects reported not previously addressed with a pharmacist or physician: None reported  Questions for the pharmacist: No    Confirmed patient received a Conservation Officer, Historic Buildings and a Surveyor, Mining with first shipment. The patient will receive a drug information handout for each medication shipped and additional FDA Medication Guides as required.       DISEASE/MEDICATION-SPECIFIC INFORMATION        N/A    SPECIALTY MEDICATION ADHERENCE     Medication Adherence    Patient reported X missed doses in the last month: 0  Specialty Medication: VITRAKVI  100 mg capsule  Patient is on additional specialty medications: No              Were doses missed due to medication being on hold? No    Vitrakvi  100 mg: 0 doses of medicine on hand        Specialty medication is an injection or given on a cycle: No    REFERRAL TO PHARMACIST     Referral to the pharmacist: Not needed      [facility]     Shipping address confirmed in Epic.     Cost and Payment: Patient has a copay of $8. The patient has Medicaid and has expressed that they are unable to provide payment at this time. The patient will be sent a bill for them to pay a later date.    Delivery Scheduled: Yes, Expected medication delivery date: 02/29/24.     Medication will be delivered via UPS to the Home address in Epic WAM.    [facility]  Specialty Technician

## 2024-02-26 NOTE — Progress Notes (Signed)
 Called facility in chart [phone] and left a message for Jarae to call back to discuss medications and follow up appointment.

## 2024-02-27 MED ORDER — TRIAMCINOLONE ACETONIDE 0.1 % TOPICAL OINTMENT
Freq: Two times a day (BID) | TOPICAL | 0 refills | 0.00000 days | Status: CP
Start: 2024-02-27 — End: 2025-02-26

## 2024-02-29 MED FILL — VITRAKVI 100 MG CAPSULE: ORAL | 30 days supply | Qty: 60 | Fill #4

## 2024-03-06 ENCOUNTER — Other Ambulatory Visit: Payer: Self-pay

## 2024-03-06 ENCOUNTER — Emergency Department (HOSPITAL_COMMUNITY)
Admission: EM | Admit: 2024-03-06 | Discharge: 2024-03-06 | Disposition: A | Discharge status: Home / Self Care | Payer: MEDICAID | Source: Home / Self Care | Attending: Emergency Medicine | Admitting: Emergency Medicine

## 2024-03-06 ENCOUNTER — Encounter (HOSPITAL_COMMUNITY): Payer: Self-pay

## 2024-03-06 DIAGNOSIS — B370 Candidal stomatitis: Secondary | ICD-10-CM

## 2024-03-06 LAB — I-STAT CHEM 8, ED
BUN: 12 mg/dL (ref 6–20)
Calcium, Ion: 1.13 mmol/L — ABNORMAL LOW (ref 1.15–1.40)
Chloride: 97 mmol/L — ABNORMAL LOW (ref 98–111)
Creatinine, Ser: 0.9 mg/dL (ref 0.61–1.24)
Glucose, Bld: 383 mg/dL — ABNORMAL HIGH (ref 70–99)
HCT: 41 % (ref 39.0–52.0)
Hemoglobin: 13.9 g/dL (ref 13.0–17.0)
Potassium: 3.5 mmol/L (ref 3.5–5.1)
Sodium: 138 mmol/L (ref 135–145)
TCO2: 28 mmol/L (ref 22–32)

## 2024-03-06 LAB — CBC WITH DIFFERENTIAL/PLATELET
Abs Immature Granulocytes: 0.02 10*3/uL (ref 0.00–0.07)
Basophils Absolute: 0 10*3/uL (ref 0.0–0.1)
Basophils Relative: 1 %
Eosinophils Absolute: 0.2 10*3/uL (ref 0.0–0.5)
Eosinophils Relative: 3 %
HCT: 37.2 % — ABNORMAL LOW (ref 39.0–52.0)
Hemoglobin: 11.9 g/dL — ABNORMAL LOW (ref 13.0–17.0)
Immature Granulocytes: 0 %
Lymphocytes Relative: 22 %
Lymphs Abs: 1.3 10*3/uL (ref 0.7–4.0)
MCH: 23.5 pg — ABNORMAL LOW (ref 26.0–34.0)
MCHC: 32 g/dL (ref 30.0–36.0)
MCV: 73.5 fL — ABNORMAL LOW (ref 80.0–100.0)
Monocytes Absolute: 0.5 10*3/uL (ref 0.1–1.0)
Monocytes Relative: 9 %
Neutro Abs: 3.9 10*3/uL (ref 1.7–7.7)
Neutrophils Relative %: 65 %
Platelets: 255 10*3/uL (ref 150–400)
RBC: 5.06 MIL/uL (ref 4.22–5.81)
RDW: 15.9 % — ABNORMAL HIGH (ref 11.5–15.5)
WBC: 6 10*3/uL (ref 4.0–10.5)
nRBC: 0 % (ref 0.0–0.2)

## 2024-03-06 LAB — GROUP A STREP BY PCR: Group A Strep by PCR: NOT DETECTED

## 2024-03-06 LAB — COMPREHENSIVE METABOLIC PANEL WITH GFR
ALT: 18 U/L (ref 0–44)
AST: 21 U/L (ref 15–41)
Albumin: 3.8 g/dL (ref 3.5–5.0)
Alkaline Phosphatase: 121 U/L (ref 38–126)
Anion gap: 10 (ref 5–15)
BUN: 12 mg/dL (ref 6–20)
CO2: 28 mmol/L (ref 22–32)
Calcium: 9.1 mg/dL (ref 8.9–10.3)
Chloride: 97 mmol/L — ABNORMAL LOW (ref 98–111)
Creatinine, Ser: 0.9 mg/dL (ref 0.61–1.24)
GFR, Estimated: >60 mL/min
Glucose, Bld: 375 mg/dL — ABNORMAL HIGH (ref 70–99)
Potassium: 3.5 mmol/L (ref 3.5–5.1)
Sodium: 136 mmol/L (ref 135–145)
Total Bilirubin: 0.3 mg/dL (ref 0.0–1.2)
Total Protein: 7.7 g/dL (ref 6.5–8.1)

## 2024-03-06 LAB — RESP PANEL BY RT-PCR (RSV, FLU A&B, COVID)  RVPGX2
Influenza A by PCR: NEGATIVE
Influenza B by PCR: NEGATIVE
Resp Syncytial Virus by PCR: NEGATIVE
SARS Coronavirus 2 by RT PCR: NEGATIVE

## 2024-03-06 LAB — CBG MONITORING, ED
Glucose-Capillary: 316 mg/dL — ABNORMAL HIGH (ref 70–99)
Glucose-Capillary: 295 mg/dL — ABNORMAL HIGH (ref 70–99)
Glucose-Capillary: 138 mg/dL — ABNORMAL HIGH (ref 70–99)

## 2024-03-06 MED ORDER — MAGIC MOUTHWASH
5.0000 mL | Freq: Once | ORAL | Status: DC
  Filled 2024-03-06: qty 5

## 2024-03-06 MED ORDER — LIDOCAINE VISCOUS HCL 2 % MT SOLN
15.0000 mL | Freq: Once | OROMUCOSAL | Status: AC
  Administered 2024-03-06: 15 mL via OROMUCOSAL
  Filled 2024-03-06: qty 15

## 2024-03-06 MED ORDER — CLOTRIMAZOLE 10 MG MT TROC: 10.0000 mg | Freq: Every day | OROMUCOSAL | 0 refills | Status: AC

## 2024-03-06 MED ORDER — CLOTRIMAZOLE 10 MG MT TROC: 10.0000 mg | Freq: Every day | OROMUCOSAL | 0 refills | Status: DC

## 2024-03-06 MED ORDER — INSULIN ASPART 100 UNIT/ML IJ SOLN
10.0000 [IU] | Freq: Once | INTRAMUSCULAR | Status: AC
  Administered 2024-03-06: 10 [IU] via SUBCUTANEOUS
  Filled 2024-03-06: qty 10

## 2024-03-06 MED ORDER — DEXAMETHASONE SOD PHOSPHATE PF 10 MG/ML IJ SOLN: 10.0000 mg | Freq: Once | INTRAMUSCULAR | Status: DC

## 2024-03-06 MED ORDER — KETOROLAC TROMETHAMINE 15 MG/ML IJ SOLN: 15.0000 mg | Freq: Once | INTRAMUSCULAR | Status: DC

## 2024-03-06 MED ORDER — MAGIC MOUTHWASH
5.0000 mL | Freq: Once | ORAL | Status: AC
  Administered 2024-03-06: 5 mL via ORAL
  Filled 2024-03-06: qty 5

## 2024-03-06 NOTE — ED Triage Notes (Signed)
 Pt BIB [facility] for throat pain and swelling. Pt has history of thyroid cancer and had thyroid removed x 3 years ago and has these symptoms intermittently and has to see his doctor at [facility] for injection to reduce swelling.

## 2024-03-06 NOTE — ED Provider Notes (Signed)
 " [place] AT [facility] Provider Note   CSN: [id number] Arrival date & time: 03/06/24  0203     Patient presents with: Sore Throat   Vincent Stanton is a 54 y.o. male.   Vincent Stanton is a 54 y.o. male with history of diabetes and hypertension, who presents to the ED for evaluation of sore throat for the past few days.  He reports it is painful to swallow and feels swollen.  He has a history of previous thyroid cancer and thyroidectomy and reports that occasionally he has had issues with throat swelling since then and has been given an injection in the past.  He denies any fevers or chills.  No neck stiffness, no headache, no cough or congestion or runny nose.  No other aggravating or alleviating factors.  The history is provided by the patient and medical records.  Sore Throat Pertinent negatives include no abdominal pain, no headaches and no shortness of breath.       Prior to Admission medications  Medication Sig Start Date End Date Taking? Authorizing Provider  amLODipine  (NORVASC ) 5 MG tablet Take 1 tablet (5 mg total) by mouth daily. 08/08/16   Keith Sor, PA-C  clindamycin  (CLEOCIN ) 150 MG capsule Take 2 capsules (300 mg total) by mouth 3 (three) times daily. May dispense as 150mg  capsules 10/16/15   Vicky Charleston, PA-C  clotrimazole  (MYCELEX ) 10 MG troche Take 1 tablet (10 mg total) by mouth 5 (five) times daily for 14 days. 03/06/24 03/20/24  Alva Larraine FALCON, PA-C  hydrocortisone  (ANUSOL -HC) 25 MG suppository Place 1 suppository (25 mg total) rectally 2 (two) times daily. For 7 days 10/28/16   Haze Lonni PARAS, MD  oxyCODONE -acetaminophen  (PERCOCET/ROXICET) 5-325 MG tablet Take 1 tablet by mouth every 6 (six) hours as needed for severe pain. 10/16/15   Vicky Charleston, PA-C    Allergies: Penicillins    Review of Systems  Constitutional:  Negative for chills and fever.  HENT:  Positive for sore throat. Negative for congestion,  rhinorrhea and trouble swallowing.   Respiratory:  Negative for cough and shortness of breath.   Gastrointestinal:  Negative for abdominal pain, nausea and vomiting.  Musculoskeletal:  Negative for neck pain and neck stiffness.  Skin:  Negative for rash.  Neurological:  Negative for headaches.  All other systems reviewed and are negative.   Updated Vital Signs BP (!) 180/90   Pulse 80   Temp 97.8 F (36.6 C) (Oral)   Resp 16   SpO2 99%   Physical Exam Vitals and nursing note reviewed.  Constitutional:      General: He is not in acute distress.    Appearance: Normal appearance. He is well-developed. He is not ill-appearing or diaphoretic.  HENT:     Head: Normocephalic and atraumatic.     Nose: No congestion or rhinorrhea.     Mouth/Throat:     Mouth: Mucous membranes are moist.     Pharynx: Uvula midline. Pharyngeal swelling and posterior oropharyngeal erythema present.     Tonsils: No tonsillar exudate or tonsillar abscesses.     Comments: Posterior oropharynx is erythematous with white coating over the back of the throat and tongue, no tonsillar exudates noted, uvula midline, no evidence of peritonsillar abscess.  Normal phonation, tolerating secretions without difficulty. Eyes:     General:        Right eye: No discharge.        Left eye: No discharge.  Cardiovascular:  Rate and Rhythm: Normal rate and regular rhythm.  Pulmonary:     Effort: Pulmonary effort is normal. No respiratory distress.     Breath sounds: Normal breath sounds.  Skin:    General: Skin is warm and dry.  Neurological:     Mental Status: He is alert and oriented to person, place, and time.     Coordination: Coordination normal.  Psychiatric:        Mood and Affect: Mood normal.        Behavior: Behavior normal.     (all labs ordered are listed, but only abnormal results are displayed) Labs Reviewed  CBC WITH DIFFERENTIAL/PLATELET - Abnormal; Notable for the following components:       Result Value   Hemoglobin 11.9 (*)    HCT 37.2 (*)    MCV 73.5 (*)    MCH 23.5 (*)    RDW 15.9 (*)    All other components within normal limits  COMPREHENSIVE METABOLIC PANEL WITH GFR - Abnormal; Notable for the following components:   Chloride 97 (*)    Glucose, Bld 375 (*)    All other components within normal limits  CBG MONITORING, ED - Abnormal; Notable for the following components:   Glucose-Capillary 316 (*)    All other components within normal limits  I-STAT CHEM 8, ED - Abnormal; Notable for the following components:   Chloride 97 (*)    Glucose, Bld 383 (*)    Calcium, Ion 1.13 (*)    All other components within normal limits  CBG MONITORING, ED - Abnormal; Notable for the following components:   Glucose-Capillary 295 (*)    All other components within normal limits  CBG MONITORING, ED - Abnormal; Notable for the following components:   Glucose-Capillary 138 (*)    All other components within normal limits  GROUP A STREP BY PCR  RESP PANEL BY RT-PCR (RSV, FLU A&B, COVID)  [id number]    EKG: None  Radiology: No results found.   Procedures   Medications Ordered in the ED  lidocaine  (XYLOCAINE ) 2 % viscous mouth solution 15 mL (15 mLs Mouth/Throat Given 03/06/24 0943)  insulin  aspart (novoLOG ) injection 10 Units (10 Units Subcutaneous Given 03/06/24 0954)  magic mouthwash (5 mLs Oral Given 03/06/24 1034)                                    Medical Decision Making Risk Prescription drug management.   Patient presents with sore throat for the past few days.  On arrival patient is hypertensive but vitals are otherwise normal.  Exam with thick coating over the tongue and tonsils suggestive of thrush.  Patient also noted to be hyperglycemic on arrival with blood sugar of 383.  Fortunately the remainder of patient's lab work is reassuring without anion gap or evidence of DKA, no other electrolyte derangements.  Blood counts are normal and strep test is negative.  COVID  flu B testing negative.  Patient treated in the ED with Magic mouthwash and viscous lidocaine  with improvement and given 10 units of insulin  aspart.  After monitoring for a few hours blood sugar has improved to 138.  At this time patient is stable for discharge home will treat thrush with Mycelex  atrocious and encourage close PCP follow-up.  Discussed the importance of blood sugar control and helping to treat thrush and making sure he is continuing to take his medications regularly.  At  this time there does not appear to be any evidence of an acute emergency medical condition requiring further emergent evaluation and the patient appears stable for discharge with appropriate outpatient follow up. Diagnosis and return precautions discussed with patient who verbalizes understanding and is agreeable to discharge.        Final diagnoses:  Thrush    ED Discharge Orders          Ordered    clotrimazole  (MYCELEX ) 10 MG troche  5 times daily,   Status:  Discontinued        03/06/24 0935    clotrimazole  (MYCELEX ) 10 MG troche  5 times daily        03/06/24 0947               Alva Larraine FALCON, PA-C 03/06/24 1439  "

## 2024-03-06 NOTE — ED Provider Triage Note (Signed)
 Emergency Medicine Provider Triage Evaluation Note  Vincent Stanton , a 54 y.o. male  was evaluated in triage.  Pt complains of sore throat. History of thyorid removal he states that causes intermittent sore throat.   Review of Systems  Positive: Sore throat Negative:   Physical Exam  BP (!) 180/90   Pulse 80   Temp 97.8 F (36.6 C) (Oral)   Resp 16   SpO2 99%  Gen:   Awake, no distress   Resp:  Normal effort  MSK:   Moves extremities without difficulty  Other:    Medical Decision Making  Medically screening exam initiated at 2:48 AM.  Appropriate orders placed.  Vincent Stanton was informed that the remainder of the evaluation will be completed by another provider, this initial triage assessment does not replace that evaluation, and the importance of remaining in the ED until their evaluation is complete.     Vincent Meth, MD 03/06/24 [phone]

## 2024-03-06 NOTE — Discharge Instructions (Addendum)
 You have thrush which is a buildup of Candida (yeast) on your tongue and throat.  To treat this use Mycelex  lozenges 5 times daily for the next 2 weeks it is important that you complete entire course of therapy even if symptoms are improving after a few days.  Your blood sugar was elevated today and improved with insulin  treatment.  Please resume your regular diabetes medications when you return home.
